# Patient Record
Sex: Female | Born: 1965 | Race: White | Hispanic: Yes | Marital: Married | Smoking: Current some day smoker
Health system: Southern US, Community
[De-identification: ages and names within clinical notes are randomized; demographics above are authoritative.]

## PROBLEM LIST (undated history)

## (undated) DIAGNOSIS — R079 Chest pain, unspecified: Secondary | ICD-10-CM

## (undated) DIAGNOSIS — Z1371 Encounter for nonprocreative screening for genetic disease carrier status: Secondary | ICD-10-CM

## (undated) DIAGNOSIS — K259 Gastric ulcer, unspecified as acute or chronic, without hemorrhage or perforation: Secondary | ICD-10-CM

## (undated) DIAGNOSIS — E538 Deficiency of other specified B group vitamins: Secondary | ICD-10-CM

## (undated) DIAGNOSIS — E7800 Pure hypercholesterolemia, unspecified: Secondary | ICD-10-CM

## (undated) DIAGNOSIS — D689 Coagulation defect, unspecified: Secondary | ICD-10-CM

## (undated) DIAGNOSIS — M1990 Unspecified osteoarthritis, unspecified site: Secondary | ICD-10-CM

## (undated) DIAGNOSIS — M2550 Pain in unspecified joint: Secondary | ICD-10-CM

## (undated) DIAGNOSIS — E119 Type 2 diabetes mellitus without complications: Secondary | ICD-10-CM

## (undated) DIAGNOSIS — K219 Gastro-esophageal reflux disease without esophagitis: Secondary | ICD-10-CM

## (undated) DIAGNOSIS — T7840XA Allergy, unspecified, initial encounter: Secondary | ICD-10-CM

## (undated) DIAGNOSIS — K769 Liver disease, unspecified: Secondary | ICD-10-CM

## (undated) DIAGNOSIS — E559 Vitamin D deficiency, unspecified: Secondary | ICD-10-CM

## (undated) DIAGNOSIS — R12 Heartburn: Secondary | ICD-10-CM

## (undated) DIAGNOSIS — K829 Disease of gallbladder, unspecified: Secondary | ICD-10-CM

## (undated) DIAGNOSIS — R002 Palpitations: Secondary | ICD-10-CM

## (undated) DIAGNOSIS — F32A Depression, unspecified: Secondary | ICD-10-CM

## (undated) DIAGNOSIS — D649 Anemia, unspecified: Secondary | ICD-10-CM

## (undated) DIAGNOSIS — I700 Atherosclerosis of aorta: Secondary | ICD-10-CM

## (undated) DIAGNOSIS — K5900 Constipation, unspecified: Secondary | ICD-10-CM

## (undated) DIAGNOSIS — G43909 Migraine, unspecified, not intractable, without status migrainosus: Secondary | ICD-10-CM

## (undated) DIAGNOSIS — K760 Fatty (change of) liver, not elsewhere classified: Secondary | ICD-10-CM

## (undated) DIAGNOSIS — R7303 Prediabetes: Secondary | ICD-10-CM

## (undated) DIAGNOSIS — K279 Peptic ulcer, site unspecified, unspecified as acute or chronic, without hemorrhage or perforation: Secondary | ICD-10-CM

## (undated) DIAGNOSIS — F419 Anxiety disorder, unspecified: Secondary | ICD-10-CM

## (undated) DIAGNOSIS — B9681 Helicobacter pylori [H. pylori] as the cause of diseases classified elsewhere: Secondary | ICD-10-CM

## (undated) DIAGNOSIS — M549 Dorsalgia, unspecified: Secondary | ICD-10-CM

## (undated) DIAGNOSIS — R569 Unspecified convulsions: Secondary | ICD-10-CM

## (undated) HISTORY — DX: Encounter for nonprocreative screening for genetic disease carrier status: Z13.71

## (undated) HISTORY — DX: Gastro-esophageal reflux disease without esophagitis: K21.9

## (undated) HISTORY — DX: Heartburn: R12

## (undated) HISTORY — DX: Pain in unspecified joint: M25.50

## (undated) HISTORY — DX: Vitamin D deficiency, unspecified: E55.9

## (undated) HISTORY — DX: Constipation, unspecified: K59.00

## (undated) HISTORY — DX: Coagulation defect, unspecified: D68.9

## (undated) HISTORY — DX: Chest pain, unspecified: R07.9

## (undated) HISTORY — DX: Depression, unspecified: F32.A

## (undated) HISTORY — DX: Unspecified osteoarthritis, unspecified site: M19.90

## (undated) HISTORY — DX: Anemia, unspecified: D64.9

## (undated) HISTORY — DX: Deficiency of other specified B group vitamins: E53.8

## (undated) HISTORY — DX: Dorsalgia, unspecified: M54.9

## (undated) HISTORY — DX: Palpitations: R00.2

## (undated) HISTORY — DX: Pure hypercholesterolemia, unspecified: E78.00

## (undated) HISTORY — DX: Anxiety disorder, unspecified: F41.9

## (undated) HISTORY — PX: COLONOSCOPY: SHX174

## (undated) HISTORY — DX: Liver disease, unspecified: K76.9

## (undated) HISTORY — DX: Fatty (change of) liver, not elsewhere classified: K76.0

## (undated) HISTORY — DX: Migraine, unspecified, not intractable, without status migrainosus: G43.909

## (undated) HISTORY — DX: Disease of gallbladder, unspecified: K82.9

## (undated) HISTORY — PX: APPENDECTOMY: SHX54

## (undated) HISTORY — DX: Allergy, unspecified, initial encounter: T78.40XA

## (undated) HISTORY — DX: Unspecified convulsions: R56.9

## (undated) HISTORY — DX: Atherosclerosis of aorta: I70.0

## (undated) HISTORY — DX: Type 2 diabetes mellitus without complications: E11.9

## (undated) HISTORY — DX: Gastric ulcer, unspecified as acute or chronic, without hemorrhage or perforation: K25.9

## (undated) HISTORY — DX: Prediabetes: R73.03

---

## 1985-07-22 HISTORY — PX: TONSILLECTOMY: SHX5217

## 1998-07-05 ENCOUNTER — Encounter: Admission: RE | Admit: 1998-07-05 | Discharge: 1998-07-05 | Payer: Self-pay | Admitting: Hematology and Oncology

## 1999-12-17 ENCOUNTER — Encounter: Admission: RE | Admit: 1999-12-17 | Discharge: 1999-12-17 | Payer: Self-pay | Admitting: Family Medicine

## 1999-12-17 ENCOUNTER — Encounter: Payer: Self-pay | Admitting: Family Medicine

## 2008-04-15 ENCOUNTER — Other Ambulatory Visit: Admission: RE | Admit: 2008-04-15 | Discharge: 2008-04-15 | Payer: Self-pay | Admitting: Gynecology

## 2008-04-15 ENCOUNTER — Encounter: Payer: Self-pay | Admitting: Gynecology

## 2008-04-15 ENCOUNTER — Ambulatory Visit: Payer: Self-pay | Admitting: Gynecology

## 2009-11-22 ENCOUNTER — Encounter (INDEPENDENT_AMBULATORY_CARE_PROVIDER_SITE_OTHER): Payer: Self-pay | Admitting: *Deleted

## 2009-12-05 ENCOUNTER — Ambulatory Visit: Payer: Self-pay | Admitting: Gastroenterology

## 2009-12-05 ENCOUNTER — Encounter (INDEPENDENT_AMBULATORY_CARE_PROVIDER_SITE_OTHER): Payer: Self-pay | Admitting: *Deleted

## 2009-12-05 DIAGNOSIS — K5909 Other constipation: Secondary | ICD-10-CM | POA: Insufficient documentation

## 2009-12-06 LAB — CONVERTED CEMR LAB: TSH: 0.63 microintl units/mL (ref 0.35–5.50)

## 2009-12-13 ENCOUNTER — Ambulatory Visit: Payer: Self-pay | Admitting: Gastroenterology

## 2009-12-13 ENCOUNTER — Encounter (INDEPENDENT_AMBULATORY_CARE_PROVIDER_SITE_OTHER): Payer: Self-pay | Admitting: *Deleted

## 2010-08-03 ENCOUNTER — Ambulatory Visit
Admission: RE | Admit: 2010-08-03 | Discharge: 2010-08-03 | Payer: Self-pay | Source: Home / Self Care | Attending: Internal Medicine | Admitting: Internal Medicine

## 2010-08-07 ENCOUNTER — Other Ambulatory Visit: Payer: Self-pay | Admitting: Gastroenterology

## 2010-08-07 ENCOUNTER — Ambulatory Visit
Admission: RE | Admit: 2010-08-07 | Discharge: 2010-08-07 | Payer: Self-pay | Source: Home / Self Care | Attending: Gastroenterology | Admitting: Gastroenterology

## 2010-08-07 DIAGNOSIS — K3189 Other diseases of stomach and duodenum: Secondary | ICD-10-CM | POA: Insufficient documentation

## 2010-08-07 DIAGNOSIS — R1013 Epigastric pain: Secondary | ICD-10-CM | POA: Insufficient documentation

## 2010-08-07 LAB — COMPREHENSIVE METABOLIC PANEL
ALT: 16 U/L (ref 0–35)
AST: 18 U/L (ref 0–37)
Albumin: 4.3 g/dL (ref 3.5–5.2)
Alkaline Phosphatase: 52 U/L (ref 39–117)
BUN: 13 mg/dL (ref 6–23)
CO2: 27 mEq/L (ref 19–32)
Calcium: 9.9 mg/dL (ref 8.4–10.5)
Chloride: 103 mEq/L (ref 96–112)
Creatinine, Ser: 0.8 mg/dL (ref 0.4–1.2)
GFR: 83.75 mL/min (ref >60.00)
Glucose, Bld: 97 mg/dL (ref 70–99)
Potassium: 5.1 mEq/L (ref 3.5–5.1)
Sodium: 139 mEq/L (ref 135–145)
Total Bilirubin: 0.4 mg/dL (ref 0.3–1.2)
Total Protein: 7.3 g/dL (ref 6.0–8.3)

## 2010-08-07 LAB — CBC WITH DIFFERENTIAL/PLATELET
Basophils Absolute: 0 10*3/uL (ref 0.0–0.1)
Basophils Relative: 0.7 % (ref 0.0–3.0)
Eosinophils Absolute: 0.2 10*3/uL (ref 0.0–0.7)
Eosinophils Relative: 2.9 % (ref 0.0–5.0)
HCT: 33.7 % — ABNORMAL LOW (ref 36.0–46.0)
Hemoglobin: 11.5 g/dL — ABNORMAL LOW (ref 12.0–15.0)
Lymphocytes Relative: 27.9 % (ref 12.0–46.0)
Lymphs Abs: 1.6 10*3/uL (ref 0.7–4.0)
MCHC: 34.1 g/dL (ref 30.0–36.0)
MCV: 87.8 fl (ref 78.0–100.0)
Monocytes Absolute: 0.4 10*3/uL (ref 0.1–1.0)
Monocytes Relative: 7.2 % (ref 3.0–12.0)
Neutro Abs: 3.6 10*3/uL (ref 1.4–7.7)
Neutrophils Relative %: 61.3 % (ref 43.0–77.0)
Platelets: 392 10*3/uL (ref 150.0–400.0)
RBC: 3.84 Mil/uL — ABNORMAL LOW (ref 3.87–5.11)
RDW: 13.9 % (ref 11.5–14.6)
WBC: 5.8 10*3/uL (ref 4.5–10.5)

## 2010-08-08 DIAGNOSIS — R1011 Right upper quadrant pain: Secondary | ICD-10-CM | POA: Insufficient documentation

## 2010-08-15 ENCOUNTER — Ambulatory Visit (HOSPITAL_COMMUNITY)
Admission: RE | Admit: 2010-08-15 | Discharge: 2010-08-15 | Payer: Self-pay | Source: Home / Self Care | Attending: Gastroenterology | Admitting: Gastroenterology

## 2010-08-16 ENCOUNTER — Ambulatory Visit (HOSPITAL_COMMUNITY)
Admission: RE | Admit: 2010-08-16 | Discharge: 2010-08-16 | Payer: Self-pay | Source: Home / Self Care | Attending: Gastroenterology | Admitting: Gastroenterology

## 2010-08-17 ENCOUNTER — Other Ambulatory Visit: Payer: Self-pay | Admitting: Gastroenterology

## 2010-08-17 DIAGNOSIS — R109 Unspecified abdominal pain: Secondary | ICD-10-CM

## 2010-08-21 NOTE — Letter (Signed)
Summary: [facility] Instructions  [facility]  [address] [place], [place] [address]   Phone: [phone]  Fax: [phone]       Anne Lewis    January 14, 1966    MRN: [id number]        Procedure Day /Date:12/13/09 WED     Arrival Time:1 pm     Procedure Time:2 pm     Location of Procedure:                    X  [facility] (4th Floor)   PREPARATION FOR COLONOSCOPY WITH MOVIPREP   Starting 5 days prior to your procedure 12/08/09 do not eat nuts, seeds, popcorn, corn, beans, peas,  salads, or any raw vegetables.  Do not take any fiber supplements (e.g. Metamucil, Citrucel, and Benefiber).  THE DAY BEFORE YOUR PROCEDURE         DATE: 12/12/09  DAY: TUE  1.  Drink clear liquids the entire day-NO SOLID FOOD  2.  Do not drink anything colored red or purple.  Avoid juices with pulp.  No orange juice.  3.  Drink at least 64 oz. (8 glasses) of fluid/clear liquids during the day to prevent dehydration and help the prep work efficiently.  CLEAR LIQUIDS INCLUDE: Water Jello Ice Popsicles Tea (sugar ok, no milk/cream) Powdered fruit flavored drinks Coffee (sugar ok, no milk/cream) Gatorade Juice: apple, white grape, white cranberry  Lemonade Clear bullion, consomm, broth Carbonated beverages (any kind) Strained chicken noodle soup Hard Candy                             4.  In the morning, mix first dose of MoviPrep solution:    Empty 1 Pouch A and 1 Pouch B into the disposable container    Add lukewarm drinking water to the top line of the container. Mix to dissolve    Refrigerate (mixed solution should be used within 24 hrs)  5.  Begin drinking the prep at 5:00 p.m. The MoviPrep container is divided by 4 marks.   Every 15 minutes drink the solution down to the next mark (approximately 8 oz) until the full liter is complete.   6.  Follow completed prep with 16 oz of clear liquid of your choice (Nothing red or purple).  Continue to drink  clear liquids until bedtime.  7.  Before going to bed, mix second dose of MoviPrep solution:    Empty 1 Pouch A and 1 Pouch B into the disposable container    Add lukewarm drinking water to the top line of the container. Mix to dissolve    Refrigerate  THE DAY OF YOUR PROCEDURE      DATE: 12/13/09 DAY: WED  Beginning at9 a.m. (5 hours before procedure):         1. Every 15 minutes, drink the solution down to the next mark (approx 8 oz) until the full liter is complete.  2. Follow completed prep with 16 oz. of clear liquid of your choice.    3. You may drink clear liquids until 12 noon (2 HOURS BEFORE PROCEDURE).   MEDICATION INSTRUCTIONS  Unless otherwise instructed, you should take regular prescription medications with a small sip of water   as early as possible the morning of your procedure.           OTHER INSTRUCTIONS  You will need a responsible adult at least 45 years of  age to accompany you and drive you home.   This person must remain in the waiting room during your procedure.  Wear loose fitting clothing that is easily removed.  Leave jewelry and other valuables at home.  However, you may wish to bring a book to read or  an iPod/MP3 player to listen to music as you wait for your procedure to start.  Remove all body piercing jewelry and leave at home.  Total time from sign-in until discharge is approximately 2-3 hours.  You should go home directly after your procedure and rest.  You can resume normal activities the  day after your procedure.  The day of your procedure you should not:   Drive   Make legal decisions   Operate machinery   Drink alcohol   Return to work  You will receive specific instructions about eating, activities and medications before you leave.    The above instructions have been reviewed and explained to me by   _______________________    I fully understand and can verbalize these instructions  _____________________________ Date _________

## 2010-08-21 NOTE — Letter (Signed)
Summary: Appt Reminder 2  [facility]  [address] [place], [place] [address]   Phone: [phone]  Fax: [phone]        Dec 13, 2009 MRN: [id number]    Anne Lewis [address] [place], [place]  [address]    Dear Ms. Hanahan,   You have a return appointment with Dr. [name] Hartigan on 01/16/10 at 10:45am.  Please remember to bring a complete list of the medicines you are taking, your insurance card and your co-pay.  If you have to cancel or reschedule this appointment, please call before 5:00 pm the evening before to avoid a cancellation fee.  If you have any questions or concerns, please call [phone].    Sincerely,    Chales Abrahams CMA (AAMA)  Appended Document: Appt Reminder 2 letter mailed

## 2010-08-21 NOTE — Procedures (Signed)
Summary: Colonoscopy  Patient: Anne Lewis Note: All result statuses are Final unless otherwise noted.  Tests: (1) Colonoscopy (COL)   COL Colonoscopy           DONE      [facility]     [address]     [place], [place]  [address]           COLONOSCOPY PROCEDURE REPORT           PATIENT:  Anne Lewis, Anne Lewis  MR#:  [id number]     BIRTHDATE:  September 11, 1965, 44 yrs. old  GENDER:  female     ENDOSCOPIST:  Rachael Fee, MD     REF. BY:  Aleatha Borer, M.D.     PROCEDURE DATE:  12/13/2009     PROCEDURE:  Diagnostic Colonoscopy     ASA CLASS:  Class II     INDICATIONS:  constipation     MEDICATIONS:   Fentanyl 100 mcg IV, Versed 11 mg IV, Benadryl 12.5     mg IV           DESCRIPTION OF PROCEDURE:   After the risks benefits and     alternatives of the procedure were thoroughly explained, informed     consent was obtained.  Digital rectal exam was performed and     revealed no rectal masses.   The Pentax Colonoscope [id number]     endoscope was introduced through the anus and advanced to the     terminal ileum which was intubated for a short distance, without     limitations.  The quality of the prep was good, using MoviPrep.     The instrument was then slowly withdrawn as the colon was fully     examined.     <<PROCEDUREIMAGES>>           FINDINGS:  External hemorrhoids were found. These were small and     not thrombosed.  Mild diverticulosis was found in the sigmoid to     descending colon segments (see image1).  The terminal ileum     appeared normal (see image5).  This was otherwise a normal     examination of the colon (see image4, image3, and image6).     Retroflexed views in the rectum revealed no abnormalities.    The     scope was then withdrawn from the patient and the procedure     completed.           COMPLICATIONS:  None     ENDOSCOPIC IMPRESSION:     1) Small external hemorrhoids     2) Mild diverticulosis in the sigmoid to descending colon  segments     3) Normal terminal ileum     4) Otherwise normal examination           RECOMMENDATIONS:     1) Continue current colorectal screening recommendations for     "routine risk" patients with a repeat colonoscopy in 10 years.     Continue 2 scoops of miralax daily.           REPEAT EXAM:  10 years           ______________________________     Rachael Fee, MD           n.     eSIGNED:   Rachael Fee at 12/13/2009 02:18 PM           Filomena Jungling, [id number]  Note: An exclamation mark (!) indicates a  result that was not dispersed into the flowsheet. Document Creation Date: 12/13/2009 2:19 PM _______________________________________________________________________  (1) Order result status: Final Collection or observation date-time: 12/13/2009 14:14 Requested date-time:  Receipt date-time:  Reported date-time:  Referring Physician:   Ordering Physician: Rob Bunting [phone]) Specimen Source:  Source: Launa Grill Order Number: [id number] Lab site:   Appended Document: Colonoscopy    Clinical Lists Changes  Observations: Added new observation of COLONNXTDUE: 11/2019 (12/13/2009 14:41)

## 2010-08-21 NOTE — Procedures (Signed)
Summary: Upper Endoscopy  Patient: Anne Lewis Note: All result statuses are Final unless otherwise noted.  Tests: (1) Upper Endoscopy (EGD)   EGD Upper Endoscopy       DONE     [facility]     [address]     [place], [place]  [address]           ENDOSCOPY PROCEDURE REPORT           PATIENT:  Anne, Lewis  MR#:  [id number]     BIRTHDATE:  11-11-1965, 44 yrs. old  GENDER:  female           ENDOSCOPIST:  Rachael Fee, MD     PROCEDURE DATE:  12/13/2009     PROCEDURE:  EGD with biopsy     ASA CLASS:  Class II     INDICATIONS:  abdominal pain, recent lab test suggesting H. pylori     Ab positive           MEDICATIONS:  There was residual sedation effect present from     prior procedure., Versed 2 mg IV     TOPICAL ANESTHETIC:  none, Exactacain Spray           DESCRIPTION OF PROCEDURE:   After the risks benefits and     alternatives of the procedure were thoroughly explained, informed     consent was obtained.  The [facility] [id number] endoscope was     introduced through the mouth and advanced to the second portion of     the duodenum, without limitations.  The instrument was slowly     withdrawn as the mucosa was fully examined.     <<PROCEDUREIMAGES>>           There was mild, non-specific granular gastritis. Biopsies taken to     check for H. pylori (sent to pathology) (see image5).  Otherwise     the examination was normal (see image6, image4, image3, and     image1).    Retroflexed views revealed no abnormalities.    The     scope was then withdrawn from the patient and the procedure     completed.           COMPLICATIONS:  None           ENDOSCOPIC IMPRESSION:     1) Mild gastritis, biopsied to check for H. pylori     2) Otherwise normal examination           RECOMMENDATIONS:     If biopsies confirm H. pylori infection, she will be started on     the appropriate antibiotics.     Dr. [name] Hartigan' office will set up return visit in 5-6 weeks.           ______________________________     Rachael Fee, MD           cc: Aleatha Borer, MD           n.     eSIGNED:   Rachael Fee at 12/13/2009 02:32 PM           Filomena Jungling, [id number]  Note: An exclamation mark (!) indicates a result that was not dispersed into the flowsheet. Document Creation Date: 12/13/2009 2:33 PM _______________________________________________________________________  (1) Order result status: Final Collection or observation date-time: 12/13/2009 14:24 Requested date-time:  Receipt date-time:  Reported date-time:  Referring Physician:   Ordering Physician: Rob Bunting [phone]) Specimen Source:  Source: Launa Grill  Order Number: [id number] Lab site:

## 2010-08-21 NOTE — Letter (Signed)
Summary: New Patient letter  [facility]  [address] [place], [place] [address]   Phone: [phone]  Fax: [phone]       11/22/2009 MRN: [id number]  Anne Lewis [address] [place], [place]  [address]  Dear Anne Lewis,  Welcome to the Gastroenterology Division at [facility].    You are scheduled to see Dr.  Rob Lewis on Dec 05, 2009 at  3:00pm on the 3rd floor at [facility], [address]  We ask that you try to arrive at our office 15 minutes prior to your appointment time to allow for check-in.  We would like you to complete the enclosed self-administered evaluation form prior to your visit and bring it with you on the day of your appointment.  We will review it with you.  Also, please bring a complete list of all your medications or, if you prefer, bring the medication bottles and we will list them.  Please bring your insurance card so that we may make a copy of it.  If your insurance requires a referral to see a specialist, please bring your referral form from your primary care physician.  Co-payments are due at the time of your visit and may be paid by cash, check or credit card.     Your office visit will consist of a consult with your physician (includes a physical exam), any laboratory testing he/she may order, scheduling of any necessary diagnostic testing (e.g. x-ray, ultrasound, CT-scan), and scheduling of a procedure (e.g. Endoscopy, Colonoscopy) if required.  Please allow enough time on your schedule to allow for any/all of these possibilities.    If you cannot keep your appointment, please call [phone] to cancel or reschedule prior to your appointment date.  This allows [place] the opportunity to schedule an appointment for another patient in need of care.  If you do not cancel or reschedule by 5 p.m. the business day prior to your appointment date, you will be charged a $50.00 late cancellation/no-show fee.     Thank you for choosing [facility] for your medical needs.  We appreciate the opportunity to care for you.  Please visit [place] at our website  to learn more about our practice.                     Sincerely,                                                             The Gastroenterology Division

## 2010-08-21 NOTE — Assessment & Plan Note (Signed)
History of Present Illness Visit Type: consult  Primary GI MD: Rob Bunting MD Primary Provider: Adelene Amas. Kathrynn Running, MD  Requesting Provider: Adelene Amas. Kathrynn Running, MD  Chief Complaint: diarrhea, and chronic constipation History of Present Illness:     very pleasant 45 year old woman with severe chronic constipation.  She has had lower abdominal pains that are usually improved with a good BM.Marland Kitchen  One month ago went to PCP, found to be constipated on imaging.    She has been very constipated, this was for a long time.  Normal for her is a BM every 1-2 weeks.  May have seen blood in her stool.  Spends alot of time in bathroom.  Never had colonoscopy.    3 years ago she was abusing laxatives but she rarely takes for now.  Has one capful of miralax a day, for 3 weeks, not improving.  she had recent lab testing showing a normal complete metabolic profile, her H. pylori IgG antibody was elevated           Current Medications (verified): 1)  Omeprazole 20 Mg Cpdr (Omeprazole) .... One Tablet By Mouth Two Times A Day 2)  Promethazine Hcl 25 Mg Tabs (Promethazine Hcl) .... One Tablet By Mouth As Needed For Nausea 3)  Miralax  Powd (Polyethylene Glycol 3350) .... Once Daily  Allergies (verified): 1)  ! * Asprin  Past History:  Past Medical History: h pylori positive by IgG antibody April, 2011 Anemia GERD  chronic constipation  Past Surgical History: c section   Family History: no colon cancer Mother had colon polyps  Social History: she is single, she has 3 children, she works as an Advertising account planner, she smoke cigarettes, she rarely drinks alcohol.  Review of Systems       Pertinent positive and negative review of systems were noted in the above HPI and GI specific review of systems.  All other review of systems was otherwise negative.   Vital Signs:  Patient profile:   45 year old female Height:      69 inches Weight:      193 pounds BMI:     28.60 BSA:      2.04 Pulse rate:   68 / minute Pulse rhythm:   regular BP sitting:   132 / 76  (left arm) Cuff size:   regular  Vitals Entered By: Ok Anis CMA (Dec 05, 2009 2:53 PM)  Physical Exam  Additional Exam:  Constitutional: generally well appearing Psychiatric: alert and oriented times 3 Eyes: extraocular movements intact Mouth: oropharynx moist, no lesions Neck: supple, no lymphadenopathy Cardiovascular: heart regular rate and rythm Lungs: CTA bilaterally Abdomen: soft, non-tender, non-distended, no obvious ascites, no peritoneal signs, normal bowel sounds Extremities: no lower extremity edema bilaterally Skin: no lesions on visible extremities    Impression & Recommendations:  Problem # 1:  Chronic constipation she has what sounds like severe, chronic constipation. She has not had a significant BM and 2-3 weeks. Today and tomorrow we will she will undergo a purging regimen with enema and high-dose MiraLax. She will be a colonoscopy hopefully sometime next week. She also get a TSH level checked today.  Problem # 2:  H. pylori antibody positive unclear she actually has H. pylori infection in her stomach and so we will proceed with EGD at the same time as her colonoscopy.  Other Orders: TLB-TSH (Thyroid Stimulating Hormone) [phone])  Patient Instructions: 1)  Use two fleets enemas today. 2)  6 scoops of miralax  tonight. 3)  6 scoops of miralax tomorrow. 4)  Then 2 scoops of miralax every day after that. 5)  You will be scheduled to have a colonoscopy. 6)  You will be scheduled to have an upper endoscopy. 7)  You will get lab test(s) done today (tsh). 8)  The medication list was reviewed and reconciled.  All changed / newly prescribed medications were explained.  A complete medication list was provided to the patient / caregiver.  Appended Document: Orders Update/movi    Clinical Lists Changes  Medications: Added new medication of MOVIPREP 100 GM  SOLR  (PEG-KCL-NACL-NASULF-NA ASC-C) As per prep instructions. - Signed Rx of MOVIPREP 100 GM  SOLR (PEG-KCL-NACL-NASULF-NA ASC-C) As per prep instructions.;  #1 x 0;  Signed;  Entered by: Chales Abrahams CMA (AAMA);  Authorized by: Rachael Fee MD;  Method used: Electronically to [facility] [address], [place], [place], [place]  [address], Ph: [phone], Fax: [phone] Orders: Added new Test order of Colon/Endo (Colon/Endo) - Signed    Prescriptions: MOVIPREP 100 GM  SOLR (PEG-KCL-NACL-NASULF-NA ASC-C) As per prep instructions.  #1 x 0   Entered by:   Chales Abrahams CMA (AAMA)   Authorized by:   Rachael Fee MD   Signed by:   Chales Abrahams CMA (AAMA) on 12/05/2009   Method used:   Electronically to        [facility] (retail)       [address]       [place], [place]  [address]       Ph: [phone]       Fax: [phone]   RxID:   [id number]

## 2010-08-21 NOTE — Letter (Signed)
Summary: Return to Work  [facility]  [address] [place], [place] [address]   Phone: [phone]  Fax: [phone]    12/05/2009  TO: Leodis Sias IT MAY CONCERN  RE: SADY MONACO [address] [id number]   The above named individual is under my medical care and may return to work on:12/07/09    If you have any further questions or need additional information, please call.     Sincerely,    typed by: Chales Abrahams CMA (AAMA)

## 2010-08-23 NOTE — Assessment & Plan Note (Signed)
Review of gastrointestinal problems: 1. Gastritis, H. pylori positive on EGD biopsies May 2011. Put on appropriate antibiotics 2. routine risk for colon cancer, colonoscopy May 2011 found no polyps, recall colonoscopy at 10 year interval    History of Present Illness Visit Type: Follow-up Visit Primary GI MD: Rob Bunting MD Primary Provider: Adelene Amas. Kathrynn Running, MD  Requesting Provider: na Chief Complaint: upper abd pain  History of Present Illness:     very pleasant 45 year old woman who is here with her husband today. I last saw her several months ago  whom I last saw several months ago.  was taking miralax 2 scoops a day.  Had anal discomfort this past weekend, in AM, after her first Amitiza.   She was doing well on miralax.  She has a lot of noise after eating, any meal.  Also has post prandial discomfort that tends to be in the right upper quadrant. She believes she an ultrasound last year and was told that she had no gallstones.               Current Medications (verified): 1)  Omeprazole-Sodium Bicarbonate 40-1100 Mg Caps (Omeprazole-Sodium Bicarbonate) .... One By Mouth Once Daily 2)  Amitiza 8 Mcg Caps (Lubiprostone) .... One By Mouth Once Daily To Two Times A Day 3)  Hyoscyamine Sulfate 0.125 Mg Tabs (Hyoscyamine Sulfate) .... One By Mouth Qid As Needed For Abdominal Cramping  Allergies (verified): 1)  ! * Asprin  Vital Signs:  Patient profile:   45 year old female Height:      69 inches Weight:      155 pounds BMI:     22.97 BSA:     1.86 Pulse rate:   76 / minute Pulse rhythm:   regular BP sitting:   118 / 64  (left arm) Cuff size:   regular  Vitals Entered By: Ok Anis CMA (August 07, 2010 1:49 PM)  Physical Exam  Additional Exam:  Constitutional: generally well appearing Psychiatric: alert and oriented times 3 Abdomen: soft, non-tender, non-distended, normal bowel sounds    Impression & Recommendations:  Problem # 1:  Chronic  constipation her anal discomfort this past weekend is unlikely to be related to the and Amitiza which she took however she really does not want to take it again and since she has been doing well on MiraLax I recommended she restart that however at a bit lower dose.  Problem # 2:  postprandial discomforts she has a lot of gastric sounds as well as some right upper quadrant discomforts after most meals.  We will get her recent ultrasound report sent over and if it is indeed normal like she believes it is then I would set her up with HIDA scan to estimate gallbladder ejection fraction. She has clearly had H. pylori in the past gallbladder evaluation is negative then I would retry her on antibiotics, probably pylera.  Patient Instructions: 1)  Stop the amitiza. 2)  Stay on miralax, but back down to once scoop a day. 3)  We will get your recent US report sent over. 4)  Depending on those results, will consider HIDA scan to check for abnormal GB imaging.  If that is normal, will try retreating H. pylori, perhaps with pylera. 5)  You will get lab test(s) done today (cbc, cmet) 6)  The medication list was reviewed and reconciled.  All changed / newly prescribed medications were explained.  A complete medication list was provided to the patient / caregiver.  Appended Document: Orders Update    Clinical Lists Changes  Problems: Added new problem of DYSPEPSIA&OTHER SPEC DISORDERS FUNCTION STOMACH (ICD-536.8) Added new problem of ABDOMINAL PAIN, EPIGASTRIC (ICD-789.06) Orders: Added new Test order of TLB-CBC Platelet - w/Differential (85025-CBCD) - Signed Added new Test order of TLB-CMP (Comprehensive Metabolic Pnl) (80053-COMP) - Signed      Appended Document: Orders Update    Clinical Lists Changes  Problems: Added new problem of RUQ PAIN (ICD-789.01) Orders: Added new Test order of Ultrasound Abdomen (UAS) - Signed

## 2010-08-23 NOTE — Assessment & Plan Note (Signed)
Summary: new to est bcbs/mhf   Vital Signs:  Patient profile:   45 year old female Menstrual status:  regular LMP:     07/10/2010 Height:      68.25 inches Weight:      157.75 pounds BMI:     23.90 O2 Sat:      100 % on Room air Temp:     98.1 degrees F oral Pulse rate:   86 / minute Resp:     16 per minute BP sitting:   104 / 70  (right arm) Cuff size:   regular  Vitals Entered By: Glendell Docker CMA (August 03, 2010 9:46 AM)  O2 Flow:  Room air CC: New Patient Is Patient Diabetic? No Pain Assessment Patient in pain? no      Comments dicusss abdominal discomfort, dx with H- Pylori,  LMP (date): 07/10/2010     Menstrual Status regular Enter LMP: 07/10/2010 Last PAP Result normal   Primary Care Provider:  Dondra Spry DO  CC:  New Patient.  History of Present Illness: Prev PCP Dr. Kathrynn Running  intermittent upper abd pain after eating occ wakes up in the night with vomiting loud noises from stomach  denies chronic NSAID use  1 large coffee per day  4 cigarettes per day   Preventive Screening-Counseling & Management  Alcohol-Tobacco     Alcohol drinks/day: 0     Smoking Status: current     Packs/Day: <0.25     Year Started: 1992  Caffeine-Diet-Exercise     Caffeine use/day: 1 beverage daily     Does Patient Exercise: no  Allergies: 1)  ! * Asprin  Past History:  Past Medical History: h pylori gastritis Anemia GERD  chronic constipation  Family History: no colon cancer Mother had colon polyps   Social History: Smoking Status:  current Packs/Day:  <0.25 Caffeine use/day:  1 beverage daily Does Patient Exercise:  no  Review of Systems  The patient denies anorexia, weight gain, chest pain, dyspnea on exertion, peripheral edema, prolonged cough, melena, hematochezia, and depression.    Physical Exam  General:  alert, well-developed, and well-nourished.   Head:  normocephalic and atraumatic.   Eyes:  pupils equal, pupils round, and  pupils reactive to light.   Mouth:  good dentition and pharynx pink and moist.   Neck:  No deformities, masses, or tenderness noted. Lungs:  Normal respiratory effort, chest expands symmetrically. Lungs are clear to auscultation, no crackles or wheezes. Heart:  Normal rate and regular rhythm. S1 and S2 normal without gallop, murmur, click, rub or other extra sounds. Abdomen:  mild epigastric tenderness,soft, no masses, no hepatomegaly, and no splenomegaly.   Extremities:  No lower extremity edema  Neurologic:  cranial nerves II-XII intact and gait normal.   Psych:  normally interactive, good eye contact, not anxious appearing, and not depressed appearing.     Impression & Recommendations:  Problem # 1:  ABDOMINAL PAIN, EPIGASTRIC (ICD-789.06) question recurrence of H. Pylori f/u with GI  Problem # 2:  DYSPEPSIA&OTHER SPEC DISORDERS FUNCTION STOMACH (ICD-536.8) trial of amitiza, probiotics and bulk laxatives  Complete Medication List: 1)  Omeprazole-sodium Bicarbonate 40-1100 Mg Caps (Omeprazole-sodium bicarbonate) .... One by mouth once daily 2)  Hyoscyamine Sulfate 0.125 Mg Tabs (Hyoscyamine sulfate) .... One by mouth qid as needed for abdominal cramping  Patient Instructions: 1)  Use bulk laxative  2)  Start probiotic supplement 3)  Use amitza samples as directed 4)  Please schedule a follow-up appointment in  1 month. 5)  Please forward copy of previous test results (CT scan of abdomin and pelvis, abdominal x ray) Prescriptions: HYOSCYAMINE SULFATE 0.125 MG TABS (HYOSCYAMINE SULFATE) one by mouth qid as needed for abdominal cramping  #60 x 1   Entered and Authorized by:   D. Thomos Lemons DO   Signed by:   D. Thomos Lemons DO on 08/03/2010   Method used:   Electronically to        [facility] (retail)       [address]       [place], [place]  [address]       Ph: [phone]       Fax: [phone]   RxID:    [id number] OMEPRAZOLE-SODIUM BICARBONATE 40-1100 MG CAPS (OMEPRAZOLE-SODIUM BICARBONATE) one by mouth once daily  #30 x 5   Entered and Authorized by:   D. Thomos Lemons DO   Signed by:   D. Thomos Lemons DO on 08/03/2010   Method used:   Electronically to        [facility] (retail)       [address]       [place], [place]  [address]       Ph: [phone]       Fax: [phone]   RxID:   [id number]    Orders Added: 1)  New Patient Level III [35573]    Current Allergies (reviewed today): ! * ASPRIN    Preventive Care Screening  Pap Smear:    Date:  12/08/2007    Results:  normal   Mammogram:    Date:  11/30/1996    Results:  Fibrocystic Breast

## 2010-08-27 ENCOUNTER — Other Ambulatory Visit (HOSPITAL_COMMUNITY): Payer: Self-pay

## 2010-09-05 ENCOUNTER — Other Ambulatory Visit (HOSPITAL_COMMUNITY): Payer: Self-pay

## 2011-05-29 ENCOUNTER — Ambulatory Visit (INDEPENDENT_AMBULATORY_CARE_PROVIDER_SITE_OTHER): Payer: BC Managed Care – PPO | Admitting: Gynecology

## 2011-05-29 ENCOUNTER — Other Ambulatory Visit (HOSPITAL_COMMUNITY)
Admission: RE | Admit: 2011-05-29 | Discharge: 2011-05-29 | Disposition: A | Discharge status: Home / Self Care | Payer: BC Managed Care – PPO | Source: Ambulatory Visit | Attending: Gynecology | Admitting: Gynecology

## 2011-05-29 ENCOUNTER — Encounter: Payer: Self-pay | Admitting: Gynecology

## 2011-05-29 VITALS — BP 104/62 | Ht 69.0 in | Wt 156.0 lb

## 2011-05-29 DIAGNOSIS — Z01419 Encounter for gynecological examination (general) (routine) without abnormal findings: Secondary | ICD-10-CM

## 2011-05-29 DIAGNOSIS — B373 Candidiasis of vulva and vagina: Secondary | ICD-10-CM

## 2011-05-29 DIAGNOSIS — N644 Mastodynia: Secondary | ICD-10-CM

## 2011-05-29 DIAGNOSIS — N898 Other specified noninflammatory disorders of vagina: Secondary | ICD-10-CM

## 2011-05-29 DIAGNOSIS — N920 Excessive and frequent menstruation with regular cycle: Secondary | ICD-10-CM

## 2011-05-29 MED ORDER — FLUCONAZOLE 150 MG PO TABS: 150.0000 mg | ORAL_TABLET | Freq: Once | ORAL | Status: AC

## 2011-05-29 NOTE — Progress Notes (Signed)
Anne Lewis April 15, 1966 [id number]        45 y.o.  for annual exam.  Complaints of continued menorrhagia and right breast pain intermittent comes and goes over the past year or so.  Past medical history,surgical history, medications, allergies, family history and social history were all reviewed and documented in the EPIC chart. ROS:  Was performed and pertinent positives and negatives are included in the history.  Exam: chaperone present Filed Vitals:   05/29/11 1526  BP: 104/62   General appearance  Normal Skin grossly normal Head/Neck normal with no cervical or supraclavicular adenopathy thyroid normal Lungs  clear Cardiac RR, without RMG Abdominal  soft, nontender, without masses, organomegaly or hernia Breasts  examined lying and sitting. Right without masses, retractions, discharge. She is tender in the right tail of Spence no abnormal abnormalities.  She has a small nodule in the axilla it feels like a small lymph node possible subcutaneous cyst. Left without masses retractions discharge adenopathy.   Pelvic  Ext/BUS/vagina  normal his white discharge KOH wet prep done  Cervix  normal  Pap done  Uterus  retroverted, normal size, shape and contour, midline and mobile nontender   Adnexa  Without masses or tenderness    Anus and perineum  normal   Rectovaginal  normal sphincter tone without palpated masses or tenderness.    Assessment/Plan:  45 y.o. female for annual exam.    1. White discharge. KOH wet prep positive for yeast. She does report vaginal discharge frequently the week before her menses. I treated her with Diflucan 150x1 dose and I gave her several refills to have available. 2. Menorrhagia. We've discussed this several years ago when she saw me for her annual and she never followed up for sonohysterogram as was recommended. Her thyroid screen was normal. Again recommended starting with sonohysterogram she agrees to schedule this. 3. Right breast pain.  She does  have a small nodule in the right axilla that feels like a small lymph node or subcutaneous cyst. She says this has been there for a long time remained stable. She is tender in the right tail of Spence although I do not feel any abnormalities. Recommended diagnostic mammogram-ultrasound both of the right tail of Spence as well as right axilla when range of motion she knows the importance of follow up. 4. Health maintenance. SBE monthly stressed. She'll follow up for her studies as above. Pap done today issues not had one in several years.  No lab work was done as this was all done through her primary who she routinely sees and says screened her for glucose lipid profile and other labs. She is mildly anemic on review of her chart I suspect this is due to her menorrhagia.    Anne Lords MD, 4:26 PM 05/29/2011

## 2011-05-30 ENCOUNTER — Other Ambulatory Visit: Payer: Self-pay | Admitting: Dermatology

## 2011-05-31 ENCOUNTER — Telehealth: Payer: Self-pay | Admitting: *Deleted

## 2011-05-31 ENCOUNTER — Other Ambulatory Visit: Payer: Self-pay | Admitting: *Deleted

## 2011-05-31 DIAGNOSIS — N644 Mastodynia: Secondary | ICD-10-CM

## 2011-05-31 NOTE — Telephone Encounter (Signed)
Patient set up for appointment with [facility] on 06/17/11 @7 :30am.  Patient was informed. Orders in pc.

## 2011-05-31 NOTE — Telephone Encounter (Signed)
Message copied by Valeda Malm L on Fri May 31, 2011 11:35 AM ------      Message from: Colin Broach P      Created: Wed May 29, 2011  4:53 PM       Anne Lewis,  patient needs diagnostic mammogram right breast ultrasound reference complaints of right tail of Spence pain exam is normal. Has small right axillary nodule for ultrasound also

## 2011-06-12 ENCOUNTER — Ambulatory Visit (INDEPENDENT_AMBULATORY_CARE_PROVIDER_SITE_OTHER): Payer: BC Managed Care – PPO

## 2011-06-12 ENCOUNTER — Encounter: Payer: Self-pay | Admitting: Gynecology

## 2011-06-12 ENCOUNTER — Ambulatory Visit (INDEPENDENT_AMBULATORY_CARE_PROVIDER_SITE_OTHER): Payer: BC Managed Care – PPO | Admitting: Gynecology

## 2011-06-12 ENCOUNTER — Other Ambulatory Visit: Payer: Self-pay | Admitting: Gynecology

## 2011-06-12 VITALS — BP 110/70

## 2011-06-12 DIAGNOSIS — N920 Excessive and frequent menstruation with regular cycle: Secondary | ICD-10-CM

## 2011-06-12 DIAGNOSIS — N852 Hypertrophy of uterus: Secondary | ICD-10-CM

## 2011-06-12 DIAGNOSIS — D259 Leiomyoma of uterus, unspecified: Secondary | ICD-10-CM

## 2011-06-12 DIAGNOSIS — D251 Intramural leiomyoma of uterus: Secondary | ICD-10-CM

## 2011-06-12 DIAGNOSIS — N854 Malposition of uterus: Secondary | ICD-10-CM

## 2011-06-12 DIAGNOSIS — N831 Corpus luteum cyst of ovary, unspecified side: Secondary | ICD-10-CM

## 2011-06-12 NOTE — Progress Notes (Signed)
Patient presents for sonohistogram due to her history of menorrhagia. Ultrasound initially shows an endometrial echo 9.7 mm. She has several small myomas largest measuring 34 mm. Right / left ovaries normal with physiologic changes. Sonohysterogram was performed sterile technique requiring single tooth tenaculum cervical traction as uterus was significantly retroverted, good distention, no endometrial defects seen. Endometrial sample was taken. Patient tolerated well.  Assessment and plan: Menorrhagia several small myomas. Patient will follow up for biopsy results. I discussed options to include hormonal manipulation, Mirena IUD, endometrial ablation, hysterectomy. Patient has tried oral contraceptives in the past as well as Depo-Provera she had continued irregular bleeding on these and does not want to entertain those. Mirena IUD placement procedure was discussed. It was somewhat difficult to get the sonohistogram catheter in place I think a paracervical would help with the IUD placement I discussed this with her. I also reviewed with the endometrial ablation she is not actively using contraception as she is not sexually active but she would need to actively use contraception if she had the endometrial ablation and she understands she should never pursue pregnancy following this as it could be dangerous. Patient wants to think of her options and she will follow up her biopsy results and we'll go from there.

## 2011-06-17 ENCOUNTER — Ambulatory Visit
Admission: RE | Admit: 2011-06-17 | Discharge: 2011-06-17 | Disposition: A | Discharge status: Home / Self Care | Payer: BC Managed Care – PPO | Source: Ambulatory Visit | Attending: Gynecology | Admitting: Gynecology

## 2011-06-17 DIAGNOSIS — N644 Mastodynia: Secondary | ICD-10-CM

## 2011-06-24 ENCOUNTER — Other Ambulatory Visit: Payer: Self-pay

## 2011-06-24 ENCOUNTER — Telehealth: Payer: Self-pay

## 2011-06-24 DIAGNOSIS — N920 Excessive and frequent menstruation with regular cycle: Secondary | ICD-10-CM

## 2011-06-24 NOTE — Telephone Encounter (Signed)
I called pt. regarding Her Option Ablation.  I talked with Eber Jones at [facility] on 11/30 and she confirmed ablation in office for $15 copymt. Pt informed of this.  Patient is ready to schedule. Anticipates her period to start around Dec 13 and she flows for 10 days.  She knows she will be needing to start Prometrium on Day 3 of her period and she will call me when it starts so I can call that in.  (I will also call her Cytotec in and schedule her post op appt at that same time.) She understands we cannot do procedure if she is bleeding.  We scheduled her for January 7th with preop on Dec 28 at 3:00pm. She knows she will need someone to drive her to and from the procedure. I mailed her full bladder instructions as well as these dates.

## 2011-07-01 ENCOUNTER — Telehealth: Payer: Self-pay

## 2011-07-01 MED ORDER — MISOPROSTOL 200 MCG PO TABS: ORAL_TABLET | ORAL | Status: AC

## 2011-07-01 MED ORDER — PROGESTERONE MICRONIZED 200 MG PO CAPS: ORAL_CAPSULE | ORAL | Status: AC

## 2011-07-01 NOTE — Telephone Encounter (Signed)
Patient called to say her period had started on Sat., Dec 8th, a week earlier than she anticipated and now she is concerned she will be starting her period again at the time we had scheduled ablation. We rescheduled her for Weds., Dec. 19th at 9:30am and her preop appt with Dr. Velvet Bathe will be Monday, Dec 17 at 2:30pm.  I instructed her regarding need to start Prometrium tonight, Day 3 and take daily and also, regarding Cytotec vaginally hs before procedure.  I have e-scribed those to her pharmacy.  She was instructed regarding full bladder day of procedure and to have someone to drive her to and from procedure.     I left message for patient to call me back so I can review with her the need to use reliable birth control to include vasectomy and lap ster or consistent condom use per TF and to be sure she understands it would be dangerous to become pregnant after ablation.

## 2011-07-01 NOTE — Telephone Encounter (Signed)
Patient called me back and we discussed how important it will be for her to use reliable birth control after ablation and how dangerous it could be for her to become pregnant.  She is not interested in tubal ligation and said she is not sexually active now.  I explained that even sometime later if she were to become sexually active that she will have to be so careful.  I encouraged her to consider lap ster or vasectomy in partner if she does contemplate becoming sexually active again.  If not she must use condoms consistently.  She said she understood.

## 2011-07-08 ENCOUNTER — Other Ambulatory Visit: Payer: Self-pay

## 2011-07-08 ENCOUNTER — Encounter: Payer: Self-pay | Admitting: Gynecology

## 2011-07-08 ENCOUNTER — Ambulatory Visit (INDEPENDENT_AMBULATORY_CARE_PROVIDER_SITE_OTHER): Payer: BC Managed Care – PPO | Admitting: Gynecology

## 2011-07-08 VITALS — BP 120/70

## 2011-07-08 DIAGNOSIS — N920 Excessive and frequent menstruation with regular cycle: Secondary | ICD-10-CM

## 2011-07-08 MED ORDER — AZITHROMYCIN 500 MG PO TABS: ORAL_TABLET | ORAL | Status: DC

## 2011-07-08 MED ORDER — HYDROCODONE-ACETAMINOPHEN 5-500 MG PO TABS: ORAL_TABLET | ORAL | Status: DC

## 2011-07-08 MED ORDER — DIAZEPAM 5 MG PO TABS: ORAL_TABLET | ORAL | Status: DC

## 2011-07-08 NOTE — Progress Notes (Signed)
Patient presents preop for upcoming HerOption endometrial ablation.  We again discussed options for her bleeding as noted in her 06/12/2011 note. Hormonal manipulation, Mirena IUD, endometrial ablation and hysterectomy were discussed. She is not sexual active but does not have sterilization. I reviewed with HerOption endometrial ablation that she should never pursue pregnancy and in fact it would be very dangerous if she did get pregnant and needs to use assured birth control.  IUD or sterilization with ablation was discussed and offered and she declined, understanding the absolute need to use contraception through menopause and accepts this and wants to proceed with the procedure. I reviewed what is involved with the procedure to include the risks of bleeding, transfusion, infection, uterine perforation, damage to internal organs requiring major reparative surgeries as well as hematometria requiring reoperation in the future were all reviewed with her. She understands there are no guarantees of menorrhagia relief and her pain, bleeding may continue worsen or change following the procedure and she understands and accepts this. She has read through our consent form and signed it. I reviewed medication use with her both preoperative and postoperative questions were answered.  Exam with chaperone present external BUS vagina normal. The cervix normal. Uterus retroverted midline mobile nontender adnexa without masses or tenderness

## 2011-07-08 NOTE — Patient Instructions (Signed)
Follow up for HerOption endometrial ablation as scheduled

## 2011-07-08 NOTE — Progress Notes (Signed)
RX'S WERE FAXED TO [facility] PER DR. TF/KA  DR. TF, you will need to sign off RX's in the EPIC system as I do not have security to do that. thx

## 2011-07-10 ENCOUNTER — Ambulatory Visit (INDEPENDENT_AMBULATORY_CARE_PROVIDER_SITE_OTHER): Payer: BC Managed Care – PPO | Admitting: Gynecology

## 2011-07-10 ENCOUNTER — Ambulatory Visit: Payer: BC Managed Care – PPO

## 2011-07-10 ENCOUNTER — Encounter: Payer: Self-pay | Admitting: Gynecology

## 2011-07-10 VITALS — BP 116/70 | HR 80

## 2011-07-10 DIAGNOSIS — N949 Unspecified condition associated with female genital organs and menstrual cycle: Secondary | ICD-10-CM

## 2011-07-10 DIAGNOSIS — N920 Excessive and frequent menstruation with regular cycle: Secondary | ICD-10-CM

## 2011-07-10 HISTORY — PX: ENDOMETRIAL CRYOABLATION: SHX1499

## 2011-07-10 MED ORDER — LIDOCAINE HCL 1 % IJ SOLN: 15.0000 mL | Freq: Once | INTRAMUSCULAR | Status: DC

## 2011-07-10 NOTE — Progress Notes (Signed)
HER OPTION ENDOMETRIAL ABLATION PROCEDURAL NOTE    Anne Lewis [birth year] [id number]   07/10/2011  Diagnosis:  Excessive uterine bleeding / Menorrhagia  Procedure:  Endometrial cryoablation with intraoperative ultrasonic guidance  Procedure:  The patient was brought to the treatment room having previously been counseled for the procedure and having signed the consent form that is scanned into Epic. Pre procedural medications received were 2 Lortab 5.0 at 9:50 AM.  The patient was placed in the dorsal lithotomy position and a speculum was inserted. The cervix and upper vagina were cleansed with Betadine. A single-tooth tenaculum was placed on the anterior lip of the cervix. A  paracervical block was placed yes using 15 cc's of 1% lidocaine.  The uterus was yes sounded 9 centimeters. Cervical dilatation performed no . Under ultrasound guidance, the Her Option probe was introduced into the uterine cavity after the preprocedural sequence was performed. After assuring proper cornual placement, cryoablation was then performed under continuous ultrasound guidance monitoring the growth of the cryo-zone. Sequential cryoablation's were performed in the following order:   Location   Length of Time Myometrial Depth 1. right cornua   6 minutes  Sagittal 10.2 mm transverse 10.4 mm mm 2. left cornua   5 minutes  Sagittal 8.7 mm transverse 8.0 mm mm    Upon completion of the procedure the instruments were removed, hemostasis visualized the patient was assisted to the bathroom and then to another exam room where she was observed. The patient tolerated the procedure well and was released in stable condition with her driver along with a copy of the postprocedural instructions and precautions which were reviewed with her. She is to return to the office in 2 weeks for post procedural check and received an appointment date and time 9 Jan @ 3:30 .    Dara Lords MD, 10:48 AM 07/10/2011

## 2011-07-18 ENCOUNTER — Encounter: Payer: Self-pay | Admitting: Gynecology

## 2011-07-19 ENCOUNTER — Institutional Professional Consult (permissible substitution): Payer: BC Managed Care – PPO | Admitting: Gynecology

## 2011-07-29 ENCOUNTER — Ambulatory Visit: Payer: BC Managed Care – PPO | Admitting: Gynecology

## 2011-07-29 ENCOUNTER — Other Ambulatory Visit: Payer: BC Managed Care – PPO

## 2011-07-30 ENCOUNTER — Institutional Professional Consult (permissible substitution): Payer: BC Managed Care – PPO | Admitting: Gynecology

## 2011-07-31 ENCOUNTER — Ambulatory Visit: Payer: BC Managed Care – PPO | Admitting: Gynecology

## 2011-08-02 ENCOUNTER — Ambulatory Visit: Payer: BC Managed Care – PPO | Admitting: Gynecology

## 2011-08-02 ENCOUNTER — Other Ambulatory Visit: Payer: BC Managed Care – PPO

## 2011-08-08 ENCOUNTER — Ambulatory Visit (INDEPENDENT_AMBULATORY_CARE_PROVIDER_SITE_OTHER): Payer: BC Managed Care – PPO | Admitting: Gynecology

## 2011-08-08 ENCOUNTER — Encounter: Payer: Self-pay | Admitting: Gynecology

## 2011-08-08 DIAGNOSIS — Z9889 Other specified postprocedural states: Secondary | ICD-10-CM

## 2011-08-08 DIAGNOSIS — N920 Excessive and frequent menstruation with regular cycle: Secondary | ICD-10-CM

## 2011-08-08 NOTE — Progress Notes (Signed)
Patient presents 1 month postop from HerOption endometrial ablation. She's done well with some bleeding on and off.  Exam was Sherri chaperone present Abdomen soft nontender no masses guarding rebound organomegaly. Pelvic external BUS vagina normal. Cervix normal. Uterus retroverted normal size midline mobile nontender. Adnexa without masses or tenderness.  Assessment and plan: Status post HerOption endometrial ablation doing well. Patient will keep menstrual calendar assuming acceptable periods then we'll follow up in the fall when she is due for her annual. I again stressed the need to use contraception if she becomes sexually active.

## 2011-08-08 NOTE — Patient Instructions (Signed)
Keep menstrual calendar. As long as you're periods are acceptable then we'll monitor and follow up in the fall when you're due for your annual exam.

## 2011-08-16 ENCOUNTER — Encounter: Payer: Self-pay | Admitting: *Deleted

## 2011-08-16 NOTE — Progress Notes (Signed)
Patient ID: Anne Lewis, female   DOB: [birth year], 46 y.o.   MRN: [id number] Pt called c/o bleeding LMP:08/15/11 PER LAST POST-OP VISIT PT IS KEEP MENSTRUAL CALENDER OF PERIODS. PT INFORMED WITH THIS AND IS OKAY

## 2011-08-20 ENCOUNTER — Telehealth: Payer: Self-pay | Admitting: *Deleted

## 2011-08-20 MED ORDER — MEGESTROL ACETATE 20 MG PO TABS: 20.0000 mg | ORAL_TABLET | Freq: Two times a day (BID) | ORAL | Status: AC

## 2011-08-20 NOTE — Telephone Encounter (Signed)
Call patient and I would recommend Megace 20 mg twice a day until bleeding minimal then once daily for one week and then stop and we'll see what she does bleeding if it has continued bleeding or recurrent heavy bleeding then office visit. Not sure need to see her right now for this unless she has concern I be happy to see her. Megace order was placed

## 2011-08-20 NOTE — Telephone Encounter (Signed)
Left message on pt vm to make office visit due to heavy bleeding.

## 2011-08-20 NOTE — Telephone Encounter (Signed)
Addended by: Dara Lords on: 08/20/2011 02:09 PM   Modules accepted: Orders

## 2011-08-20 NOTE — Telephone Encounter (Signed)
Left message for pt to call.

## 2011-08-20 NOTE — Telephone Encounter (Signed)
Spoke with pt about the below note and she will try megace to see if that works if not then she will make OV.

## 2011-08-20 NOTE — Telephone Encounter (Signed)
Pt is 1 month postop from HerOption endometrial ablation. She noted of heavy bleeding that is not slowing down, pt said she is changing pad very frequent. Sometimes every 20 mins, pt was seen on 08/08/11 for post op visit.  Please advise

## 2011-08-23 ENCOUNTER — Ambulatory Visit: Payer: BC Managed Care – PPO | Admitting: Gynecology

## 2012-01-14 NOTE — Progress Notes (Signed)
Patient ID: Anne Lewis, female   DOB: 04-Aug-1965, 46 y.o.   MRN: [id number] PT. C-O FREQUENT HEAVY PERIODS AGAIN WITH CLOTS AND SOME DIZZINESS: AND PER TF'S LAST OV NOTE PT NEEDS OV IF PERSISTENT AFTER USING MEGACE. PT, SET UP FOR 01-15-12 A.M. WITH DR. Velvet Bathe. I  ADVISED PT IF SYMPTOMS WORSEN AFTER HOURS TODAY SHE NEEDS TO GO [facility] TO [facility]ED. SHE STATES SHE IS OK TO WAIT TO SEE TF TOMORROW. I ALSO ADVISED HER TO STAY OFF OF HER FEET AND REST AND DRINK PLENTY OF FLUIDS UNTIL SHE IS EVALUATED.

## 2012-01-15 ENCOUNTER — Ambulatory Visit (INDEPENDENT_AMBULATORY_CARE_PROVIDER_SITE_OTHER): Payer: BC Managed Care – PPO | Admitting: Gynecology

## 2012-01-15 ENCOUNTER — Encounter: Payer: Self-pay | Admitting: Gynecology

## 2012-01-15 DIAGNOSIS — N926 Irregular menstruation, unspecified: Secondary | ICD-10-CM

## 2012-01-15 DIAGNOSIS — N946 Dysmenorrhea, unspecified: Secondary | ICD-10-CM

## 2012-01-15 LAB — CBC WITH DIFFERENTIAL/PLATELET
Basophils Absolute: 0 10*3/uL (ref 0.0–0.1)
Basophils Relative: 1 % (ref 0–1)
Eosinophils Absolute: 0.2 10*3/uL (ref 0.0–0.7)
Eosinophils Relative: 3 % (ref 0–5)
Lymphocytes Relative: 31 % (ref 12–46)
MCHC: 33.9 g/dL (ref 30.0–36.0)
MCV: 88.7 fL (ref 78.0–100.0)
Platelets: 389 10*3/uL (ref 150–400)
RDW: 13.3 % (ref 11.5–15.5)
WBC: 5.8 10*3/uL (ref 4.0–10.5)

## 2012-01-15 NOTE — Patient Instructions (Signed)
Follow up for ultrasound as scheduled 

## 2012-01-15 NOTE — Progress Notes (Signed)
Patient presents having undergone HerOption endometrial ablation in December has had some sporadic menses since then up to twice monthly and then most recently had a heavy bleed where blood ran down her legs. Also some cramping. Is sexually active but her partner has vasectomy.  Exam with Sherrilyn Rist Asst. Abdomen soft nontender without masses guarding rebound organomegaly. Pelvic external BUS vagina with scant bleeding. Cervix normal. Uterus retroverted normal size midline mobile nontender. Adnexa without masses or tenderness.  Assessment and plan: Irregular menses, menorrhagia, dysmenorrhea status post endometrial ablation. Will check baseline CBC and ultrasound rule out hematometria. Options for management to include hormonal manipulation, reablation, attempt at IUD placement, hysterectomy reviewed. Uterus is low and I think vaginal hysterectomy would be possible. She does have 2 prior cesareans and the options for LAVH up to and including TAH were also reviewed with her. Patient will follow up for her ultrasound and then we'll further discuss options. I did discuss whether to do a sonohysterogram to assess cavity shape if she would consider reablation or IUD but she does not feel the she would pursue this and will start with a simple transvaginal ultrasound.

## 2012-01-17 ENCOUNTER — Encounter: Payer: Self-pay | Admitting: Gynecology

## 2012-01-17 ENCOUNTER — Ambulatory Visit (INDEPENDENT_AMBULATORY_CARE_PROVIDER_SITE_OTHER): Payer: BC Managed Care – PPO

## 2012-01-17 ENCOUNTER — Ambulatory Visit (INDEPENDENT_AMBULATORY_CARE_PROVIDER_SITE_OTHER): Payer: BC Managed Care – PPO | Admitting: Gynecology

## 2012-01-17 VITALS — BP 120/80

## 2012-01-17 DIAGNOSIS — N926 Irregular menstruation, unspecified: Secondary | ICD-10-CM

## 2012-01-17 DIAGNOSIS — N920 Excessive and frequent menstruation with regular cycle: Secondary | ICD-10-CM

## 2012-01-17 DIAGNOSIS — D259 Leiomyoma of uterus, unspecified: Secondary | ICD-10-CM

## 2012-01-17 NOTE — Patient Instructions (Signed)
Follow up with decision about hysterectomy. 

## 2012-01-17 NOTE — Progress Notes (Signed)
Patient follows up for ultrasound with her history of irregular and heavy bleeding status post endometrial ablation. Ultrasound shows several small myomas right/left ovaries normal. Endometrial echo of 5.5 mm with no evidence of hematometria. Review this with the patient and again the options for management of her irregular heavy bleeding. Patient is leaning towards hysterectomy and she wants to check at work as for his time off and will call us when she is ready to schedule. If irregular bleeding or heavy bleeding continues I discussed possible Megace suppression to temporize until surgery.

## 2013-10-20 ENCOUNTER — Ambulatory Visit (INDEPENDENT_AMBULATORY_CARE_PROVIDER_SITE_OTHER): Payer: BC Managed Care – PPO | Admitting: Gynecology

## 2013-10-20 ENCOUNTER — Encounter: Payer: Self-pay | Admitting: Gynecology

## 2013-10-20 ENCOUNTER — Telehealth: Payer: Self-pay | Admitting: *Deleted

## 2013-10-20 ENCOUNTER — Other Ambulatory Visit (HOSPITAL_COMMUNITY)
Admission: RE | Admit: 2013-10-20 | Discharge: 2013-10-20 | Disposition: A | Discharge status: Home / Self Care | Payer: BC Managed Care – PPO | Source: Ambulatory Visit | Attending: Gynecology | Admitting: Gynecology

## 2013-10-20 VITALS — BP 120/74 | Ht 68.5 in | Wt 169.0 lb

## 2013-10-20 DIAGNOSIS — Z124 Encounter for screening for malignant neoplasm of cervix: Secondary | ICD-10-CM | POA: Insufficient documentation

## 2013-10-20 DIAGNOSIS — N926 Irregular menstruation, unspecified: Secondary | ICD-10-CM

## 2013-10-20 DIAGNOSIS — Z1151 Encounter for screening for human papillomavirus (HPV): Secondary | ICD-10-CM | POA: Insufficient documentation

## 2013-10-20 DIAGNOSIS — Z01419 Encounter for gynecological examination (general) (routine) without abnormal findings: Secondary | ICD-10-CM

## 2013-10-20 LAB — COMPREHENSIVE METABOLIC PANEL
ALT: 16 U/L (ref 0–35)
AST: 15 U/L (ref 0–37)
Albumin: 4.6 g/dL (ref 3.5–5.2)
Alkaline Phosphatase: 49 U/L (ref 39–117)
BUN: 14 mg/dL (ref 6–23)
CALCIUM: 9.9 mg/dL (ref 8.4–10.5)
CHLORIDE: 100 meq/L (ref 96–112)
CO2: 27 meq/L (ref 19–32)
CREATININE: 0.76 mg/dL (ref 0.50–1.10)
GLUCOSE: 81 mg/dL (ref 70–99)
Potassium: 4.3 mEq/L (ref 3.5–5.3)
Sodium: 136 mEq/L (ref 135–145)
Total Bilirubin: 0.5 mg/dL (ref 0.2–1.2)
Total Protein: 7.2 g/dL (ref 6.0–8.3)

## 2013-10-20 LAB — CBC WITH DIFFERENTIAL/PLATELET
Basophils Absolute: 0.1 10*3/uL (ref 0.0–0.1)
Basophils Relative: 1 % (ref 0–1)
EOS PCT: 1 % (ref 0–5)
Eosinophils Absolute: 0.1 10*3/uL (ref 0.0–0.7)
HEMATOCRIT: 44.6 % (ref 36.0–46.0)
Hemoglobin: 15 g/dL (ref 12.0–15.0)
LYMPHS ABS: 1.7 10*3/uL (ref 0.7–4.0)
LYMPHS PCT: 21 % (ref 12–46)
MCH: 30.2 pg (ref 26.0–34.0)
MCHC: 33.6 g/dL (ref 30.0–36.0)
MCV: 89.7 fL (ref 78.0–100.0)
MONO ABS: 0.5 10*3/uL (ref 0.1–1.0)
MONOS PCT: 6 % (ref 3–12)
Neutro Abs: 5.6 10*3/uL (ref 1.7–7.7)
Neutrophils Relative %: 71 % (ref 43–77)
Platelets: 370 10*3/uL (ref 150–400)
RBC: 4.97 MIL/uL (ref 3.87–5.11)
RDW: 13.4 % (ref 11.5–15.5)
WBC: 7.9 10*3/uL (ref 4.0–10.5)

## 2013-10-20 LAB — LIPID PANEL
CHOL/HDL RATIO: 3.4 ratio
CHOLESTEROL: 191 mg/dL (ref 0–200)
HDL: 57 mg/dL (ref >39)
LDL Cholesterol: 119 mg/dL — ABNORMAL HIGH (ref 0–99)
TRIGLYCERIDES: 75 mg/dL (ref <150)
VLDL: 15 mg/dL (ref 0–40)

## 2013-10-20 NOTE — Progress Notes (Signed)
Anne Lewis 01-31-1966 [id number]        48 y.o.  G3P3 for annual exam.  Several issues noted below.  Past medical history,surgical history, problem list, medications, allergies, family history and social history were all reviewed and documented in the EPIC chart.  ROS:  Performed and pertinent positives and negatives are included in the history, assessment and plan .  Exam: Kim assistant Filed Vitals:   10/20/13 1130  BP: 120/74  Height: 5' 8.5" (1.74 m)  Weight: 169 lb (76.658 kg)   General appearance  Normal Skin grossly normal Head/Neck normal with no cervical or supraclavicular adenopathy thyroid normal Lungs  clear Cardiac RR, without RMG Abdominal  soft, nontender, without masses, organomegaly or hernia Breasts  examined lying and sitting without masses, retractions, discharge or axillary adenopathy. Pelvic  Ext/BUS/vagina normal.  Cervix normal. Pap/HPV  Uterus anteverted, normal size, shape and contour, midline and mobile nontender   Adnexa  Without masses or tenderness    Anus and perineum  Normal   Rectovaginal  Normal sphincter tone without palpated masses or tenderness.    Assessment/Plan:  48 y.o. G3P3 female for annual exam irregular menses, vasectomy birth control.   1. Irregular menses. Patient's menses continue irregular status post her option endometrial ablation 2012. She will bleed up to 3 times monthly. Never heavy. Very little pain. Ultrasound last year showed multiple small myomas otherwise normal with endometrial echo 5.5 mm. No evidence of an endometrial. Patient was contemplating hysterectomy but never followed up for this. Recommend sonohysterogram to assess cavity. Possibilities to include proceeding with hysterectomy now versus attempted Mirena IUD or other hormonal manipulation. We'll reassess after sonohysterogram. We'll also check baseline CBC TSH and FSH. 2. Strong family history of cancer. Has multiple family members to include maternal aunt  and maternal cousins x2 with breast cancer. Patient's sister in her 29s apparently had ovarian/intestinal type cancer. Her mother also had some form of intestinal cancer. No genetic testing and she is aware of. I strongly recommended she followup with the genetic counselor to consider genetic testing possible BRCA as well as Lynch. Patient agrees with this will help arrange this appointment. Implications as far as risk reductive surgery as well as as far as her children are concerned was discussed. 3. Mammography 2012. Strongly recommended she schedule now she agrees to do so. SBE monthly review. 4. Pap smear 2012. Pap/HPV done today. No history of significant abnormal Pap smears previously. 5. Colonoscopy. Patient's in the process of arranging. Had colonic polyps removed several years ago and they recommended a repeat colonoscopy this year. 6. Health maintenance. Baseline CBC comprehensive metabolic panel lipid profile urinalysis TSH FSH ordered. Followup for lab results and ultrasound.   Note: This document was prepared with digital dictation and possible smart phrase technology. Any transcriptional errors that result from this process are unintentional.   Anastasio Auerbach MD, 11:57 AM 10/20/2013

## 2013-10-20 NOTE — Patient Instructions (Signed)
Office will followup with you in reference to your blood results. Followup for the ultrasound as scheduled. Office will help to arrange an appointment with a genetic counselor reference to your family history of cancer. Schedule and followup for mammogram  You may obtain a copy of any labs that were done today by logging onto MyChart as outlined in the instructions provided with your AVS (after visit summary). The office will not call with normal lab results but certainly if there are any significant abnormalities then we will contact you.   Health Maintenance, Female A healthy lifestyle and preventative care can promote health and wellness.  Maintain regular health, dental, and eye exams.  Eat a healthy diet. Foods like vegetables, fruits, whole grains, low-fat dairy products, and lean protein foods contain the nutrients you need without too many calories. Decrease your intake of foods high in solid fats, added sugars, and salt. Get information about a proper diet from your caregiver, if necessary.  Regular physical exercise is one of the most important things you can do for your health. Most adults should get at least 150 minutes of moderate-intensity exercise (any activity that increases your heart rate and causes you to sweat) each week. In addition, most adults need muscle-strengthening exercises on 2 or more days a week.   Maintain a healthy weight. The body mass index (BMI) is a screening tool to identify possible weight problems. It provides an estimate of body fat based on height and weight. Your caregiver can help determine your BMI, and can help you achieve or maintain a healthy weight. For adults 20 years and older:  A BMI below 18.5 is considered underweight.  A BMI of 18.5 to 24.9 is normal.  A BMI of 25 to 29.9 is considered overweight.  A BMI of 30 and above is considered obese.  Maintain normal blood lipids and cholesterol by exercising and minimizing your intake of  saturated fat. Eat a balanced diet with plenty of fruits and vegetables. Blood tests for lipids and cholesterol should begin at age 27 and be repeated every 5 years. If your lipid or cholesterol levels are high, you are over 50, or you are a high risk for heart disease, you may need your cholesterol levels checked more frequently.Ongoing high lipid and cholesterol levels should be treated with medicines if diet and exercise are not effective.  If you smoke, find out from your caregiver how to quit. If you do not use tobacco, do not start.  Lung cancer screening is recommended for adults aged 106 80 years who are at high risk for developing lung cancer because of a history of smoking. Yearly low-dose computed tomography (CT) is recommended for people who have at least a 30-pack-year history of smoking and are a current smoker or have quit within the past 15 years. A pack year of smoking is smoking an average of 1 pack of cigarettes a day for 1 year (for example: 1 pack a day for 30 years or 2 packs a day for 15 years). Yearly screening should continue until the smoker has stopped smoking for at least 15 years. Yearly screening should also be stopped for people who develop a health problem that would prevent them from having lung cancer treatment.  If you are pregnant, do not drink alcohol. If you are breastfeeding, be very cautious about drinking alcohol. If you are not pregnant and choose to drink alcohol, do not exceed 1 drink per day. One drink is considered to be 12  ounces (355 mL) of beer, 5 ounces (148 mL) of wine, or 1.5 ounces (44 mL) of liquor.  Avoid use of street drugs. Do not share needles with anyone. Ask for help if you need support or instructions about stopping the use of drugs.  High blood pressure causes heart disease and increases the risk of stroke. Blood pressure should be checked at least every 1 to 2 years. Ongoing high blood pressure should be treated with medicines, if weight loss  and exercise are not effective.  If you are 35 to 48 years old, ask your caregiver if you should take aspirin to prevent strokes.  Diabetes screening involves taking a blood sample to check your fasting blood sugar level. This should be done once every 3 years, after age 70, if you are within normal weight and without risk factors for diabetes. Testing should be considered at a younger age or be carried out more frequently if you are overweight and have at least 1 risk factor for diabetes.  Breast cancer screening is essential preventative care for women. You should practice "breast self-awareness." This means understanding the normal appearance and feel of your breasts and may include breast self-examination. Any changes detected, no matter how small, should be reported to a caregiver. Women in their 54s and 30s should have a clinical breast exam (CBE) by a caregiver as part of a regular health exam every 1 to 3 years. After age 31, women should have a CBE every year. Starting at age 25, women should consider having a mammogram (breast X-ray) every year. Women who have a family history of breast cancer should talk to their caregiver about genetic screening. Women at a high risk of breast cancer should talk to their caregiver about having an MRI and a mammogram every year.  Breast cancer gene (BRCA)-related cancer risk assessment is recommended for women who have family members with BRCA-related cancers. BRCA-related cancers include breast, ovarian, tubal, and peritoneal cancers. Having family members with these cancers may be associated with an increased risk for harmful changes (mutations) in the breast cancer genes BRCA1 and BRCA2. Results of the assessment will determine the need for genetic counseling and BRCA1 and BRCA2 testing.  The Pap test is a screening test for cervical cancer. Women should have a Pap test starting at age 34. Between ages 55 and 17, Pap tests should be repeated every 2 years.  Beginning at age 31, you should have a Pap test every 3 years as long as the past 3 Pap tests have been normal. If you had a hysterectomy for a problem that was not cancer or a condition that could lead to cancer, then you no longer need Pap tests. If you are between ages 66 and 80, and you have had normal Pap tests going back 10 years, you no longer need Pap tests. If you have had past treatment for cervical cancer or a condition that could lead to cancer, you need Pap tests and screening for cancer for at least 20 years after your treatment. If Pap tests have been discontinued, risk factors (such as a new sexual partner) need to be reassessed to determine if screening should be resumed. Some women have medical problems that increase the chance of getting cervical cancer. In these cases, your caregiver may recommend more frequent screening and Pap tests.  The human papillomavirus (HPV) test is an additional test that may be used for cervical cancer screening. The HPV test looks for the virus that can cause  the cell changes on the cervix. The cells collected during the Pap test can be tested for HPV. The HPV test could be used to screen women aged 34 years and older, and should be used in women of any age who have unclear Pap test results. After the age of 74, women should have HPV testing at the same frequency as a Pap test.  Colorectal cancer can be detected and often prevented. Most routine colorectal cancer screening begins at the age of 49 and continues through age 52. However, your caregiver may recommend screening at an earlier age if you have risk factors for colon cancer. On a yearly basis, your caregiver may provide home test kits to check for hidden blood in the stool. Use of a small camera at the end of a tube, to directly examine the colon (sigmoidoscopy or colonoscopy), can detect the earliest forms of colorectal cancer. Talk to your caregiver about this at age 26, when routine screening begins.  Direct examination of the colon should be repeated every 5 to 10 years through age 34, unless early forms of pre-cancerous polyps or small growths are found.  Hepatitis C blood testing is recommended for all people born from [birth year] through 1965 and any individual with known risks for hepatitis C.  Practice safe sex. Use condoms and avoid high-risk sexual practices to reduce the spread of sexually transmitted infections (STIs). Sexually active women aged 72 and younger should be checked for Chlamydia, which is a common sexually transmitted infection. Older women with new or multiple partners should also be tested for Chlamydia. Testing for other STIs is recommended if you are sexually active and at increased risk.  Osteoporosis is a disease in which the bones lose minerals and strength with aging. This can result in serious bone fractures. The risk of osteoporosis can be identified using a bone density scan. Women ages 34 and over and women at risk for fractures or osteoporosis should discuss screening with their caregivers. Ask your caregiver whether you should be taking a calcium supplement or vitamin D to reduce the rate of osteoporosis.  Menopause can be associated with physical symptoms and risks. Hormone replacement therapy is available to decrease symptoms and risks. You should talk to your caregiver about whether hormone replacement therapy is right for you.  Use sunscreen. Apply sunscreen liberally and repeatedly throughout the day. You should seek shade when your shadow is shorter than you. Protect yourself by wearing long sleeves, pants, a wide-brimmed hat, and sunglasses year round, whenever you are outdoors.  Notify your caregiver of new moles or changes in moles, especially if there is a change in shape or color. Also notify your caregiver if a mole is larger than the size of a pencil eraser.  Stay current with your immunizations. Document Released: 01/21/2011 Document Revised: 11/02/2012  Document Reviewed: 01/21/2011 [facility] Patient Information 2014 [employer].

## 2013-10-20 NOTE — Telephone Encounter (Signed)
Message copied by Thamas Jaegers on Wed Oct 20, 2013 12:40 PM ------      Message from: Anastasio Auerbach      Created: Wed Oct 20, 2013 12:02 PM       Arrange for genetic counseling at the cancer center. Reference multiple family members with breast cancer and intestinal/ovarian. Assess for genetic screening ------

## 2013-10-20 NOTE — Addendum Note (Signed)
Addended by: Nelva Nay on: 10/20/2013 12:12 PM   Modules accepted: Orders

## 2013-10-20 NOTE — Telephone Encounter (Signed)
Referral faxed to [facility], they will contact patient.

## 2013-10-21 ENCOUNTER — Telehealth: Payer: Self-pay | Admitting: *Deleted

## 2013-10-21 LAB — FOLLICLE STIMULATING HORMONE: FSH: 4 m[IU]/mL

## 2013-10-21 LAB — URINALYSIS W MICROSCOPIC + REFLEX CULTURE
BILIRUBIN URINE: NEGATIVE
Bacteria, UA: NONE SEEN
CRYSTALS: NONE SEEN
Casts: NONE SEEN
GLUCOSE, UA: NEGATIVE mg/dL
Hgb urine dipstick: NEGATIVE
Ketones, ur: NEGATIVE mg/dL
Leukocytes, UA: NEGATIVE
Nitrite: NEGATIVE
PROTEIN: NEGATIVE mg/dL
Specific Gravity, Urine: 1.013 (ref 1.005–1.030)
Urobilinogen, UA: 0.2 mg/dL (ref 0.0–1.0)
pH: 5 (ref 5.0–8.0)

## 2013-10-21 LAB — TSH: TSH: 0.656 u[IU]/mL (ref 0.350–4.500)

## 2013-10-21 NOTE — Telephone Encounter (Signed)
Left message for pt to return my call so I can schedule a genetic appt.  

## 2013-10-21 NOTE — Telephone Encounter (Signed)
Pt returned my call and I confirmed 11/08/13 genetic appt w/ pt.

## 2013-10-29 NOTE — Telephone Encounter (Signed)
Appointment on 11/08/13

## 2013-11-01 ENCOUNTER — Other Ambulatory Visit: Payer: Self-pay | Admitting: Gynecology

## 2013-11-01 DIAGNOSIS — D259 Leiomyoma of uterus, unspecified: Secondary | ICD-10-CM

## 2013-11-01 DIAGNOSIS — N926 Irregular menstruation, unspecified: Secondary | ICD-10-CM

## 2013-11-08 ENCOUNTER — Other Ambulatory Visit: Payer: BC Managed Care – PPO

## 2013-11-08 ENCOUNTER — Ambulatory Visit (HOSPITAL_BASED_OUTPATIENT_CLINIC_OR_DEPARTMENT_OTHER): Payer: BC Managed Care – PPO | Admitting: Genetic Counselor

## 2013-11-08 DIAGNOSIS — Z803 Family history of malignant neoplasm of breast: Secondary | ICD-10-CM

## 2013-11-08 DIAGNOSIS — Z800 Family history of malignant neoplasm of digestive organs: Secondary | ICD-10-CM

## 2013-11-08 DIAGNOSIS — Z8041 Family history of malignant neoplasm of ovary: Secondary | ICD-10-CM | POA: Insufficient documentation

## 2013-11-08 NOTE — Progress Notes (Signed)
HISTORY OF PRESENT ILLNESS: Dr. Phineas Real requested a cancer genetics consultation for Elis Rawlinson, a 48 y.o. female, due to a family history of cancer.  Ms. Hentges presents to clinic today to discuss the possibility of a hereditary predisposition to cancer, genetic testing, and to further clarify her future cancer risks, as well as potential cancer risk for family members. Ms. Prisk has no personal history of cancer.   Past Surgical History  Procedure Laterality Date   Cesarean section  3500,9381   Tonsillectomy  1987   Endometrial cryoablation  12.19.2012    [facility] - office performed   Endometrial ablation     History   Social History   Marital Status: Divorced    Spouse Name: N/A    Number of Children: N/A   Years of Education: N/A   Social History Main Topics   Smoking status: Former Smoker   Smokeless tobacco: Never Used   Alcohol Use: Yes     Comment: Social   Drug Use: No   Sexual Activity: Yes    Birth Control/ Protection: Other-see comments     Comment: Vasectomy   Other Topics Concern   Not on file   Social History Narrative   No narrative on file     FAMILY HISTORY:  During the visit, a 4-generation pedigree was obtained. Significant diagnoses include the following:  Family History  Problem Relation Age of Onset   Hypertension Mother    Cancer Mother 110    intestational and ovarian   Diabetes Father    Hypertension Father    Heart disease Father    Cancer Sister 48    ovarian and intestional   Cancer Sister 55    ovarian or uterine   Cancer Maternal Aunt 62    breast   Cancer Cousin 54    ovarian   Cancer Cousin 47    breast   Cancer Brother 24    GI cancer   Cancer Paternal Aunt 73    stomach   Cancer Maternal Grandmother 79    ovarian and pancreatic   Cancer Maternal Grandfather 73    pancreatic    Ms. Sao's ancestry is of [place] and [place] descent. There is no known Jewish ancestry or  consanguinity.  GENETIC COUNSELING ASSESSMENT: Ms. Krenzer is a 49 y.o. female with a family history of cancer highly suggestive of a hereditary predisposition to cancer. We, therefore, discussed and recommended the following at today's visit.   DISCUSSION: We reviewed the characteristics, features and inheritance patterns of hereditary cancer syndromes. We also discussed genetic testing, including the appropriate family members to test, the process of testing, insurance coverage and turn-around-time for results. We discussed the implications of a negative, positive and/or variant of uncertain significant result. We recommended Ms. Mcconaghy pursue genetic testing for the OvaNext Cancer Gene Panel at [facility].   PLAN: Based on our above recommendation, Ms. Foulk wished to pursue genetic testing and the blood sample was drawn and will be sent to [facility] for analysis of the OvaNext Cancer Gene Panel. Results should be available within approximately 4 weeks time, at which point they will be disclosed by telephone to Ms. Mattes, as will any additional recommendations warranted by these results.   Based on Ms. Marchanys family history, we recommended her sisters, who were reportedly diagnosed with ovarian cancer at early ages, have genetic counseling and testing. We discussed that it is always most informative to initiate genetic testing in a family  member diagnosed with cancer if possible, as it can sometimes help [place] better interpret results for unaffected family members. Ms. Riebe will speak with these family members and let us know if we can be of any assistance in coordinating genetic counseling and/or testing. In the meantime, we recommended Ms. Guardado continue to follow the cancer screening guidelines given to he by her primary provider.  Lastly, we encouraged Ms. Mayer to remain in contact with cancer genetics annually so that we can continuously update the family  history and inform her of any changes in cancer genetics and testing that may be of benefit for this family. Ms.  Kreamer questions were answered to her satisfaction today. Our contact information was provided should additional questions or concerns arise.   Thank you for the referral and allowing [place] to share in the care of your patient.   The patient was seen for a total of 40 minutes in face-to-face counseling.  This patient was discussed with Fontaine who agrees with the above.    _______________________________________________________________________ For Office Staff:  Number of people involved in session: 2 Was an Intern/ student involved with case: no

## 2013-11-10 ENCOUNTER — Other Ambulatory Visit: Payer: BC Managed Care – PPO

## 2013-11-10 ENCOUNTER — Ambulatory Visit: Payer: BC Managed Care – PPO | Admitting: Gynecology

## 2013-11-16 ENCOUNTER — Other Ambulatory Visit: Payer: Self-pay | Admitting: Gynecology

## 2013-11-17 ENCOUNTER — Ambulatory Visit (INDEPENDENT_AMBULATORY_CARE_PROVIDER_SITE_OTHER): Payer: BC Managed Care – PPO

## 2013-11-17 ENCOUNTER — Other Ambulatory Visit: Payer: Self-pay | Admitting: Gynecology

## 2013-11-17 ENCOUNTER — Ambulatory Visit (INDEPENDENT_AMBULATORY_CARE_PROVIDER_SITE_OTHER): Payer: BC Managed Care – PPO | Admitting: Gynecology

## 2013-11-17 ENCOUNTER — Encounter: Payer: Self-pay | Admitting: Gynecology

## 2013-11-17 DIAGNOSIS — N938 Other specified abnormal uterine and vaginal bleeding: Secondary | ICD-10-CM

## 2013-11-17 DIAGNOSIS — N852 Hypertrophy of uterus: Secondary | ICD-10-CM

## 2013-11-17 DIAGNOSIS — N831 Corpus luteum cyst of ovary, unspecified side: Secondary | ICD-10-CM

## 2013-11-17 DIAGNOSIS — D259 Leiomyoma of uterus, unspecified: Secondary | ICD-10-CM

## 2013-11-17 DIAGNOSIS — Z9889 Other specified postprocedural states: Secondary | ICD-10-CM

## 2013-11-17 DIAGNOSIS — N926 Irregular menstruation, unspecified: Secondary | ICD-10-CM

## 2013-11-17 DIAGNOSIS — N949 Unspecified condition associated with female genital organs and menstrual cycle: Secondary | ICD-10-CM

## 2013-11-17 NOTE — Patient Instructions (Addendum)
Office will call you with biopsy results. Think about the options that we discussed to manage the bleeding from observation, trial of hormonal treatments such as birth control pills, hysterectomy.

## 2013-11-17 NOTE — Progress Notes (Signed)
Anne Lewis May 28, 1966 [id number]        48 y.o.  Anne Lewis presents for sonohysterogram due to persistent irregular bleeding status post endometrial ablation.  Past medical history,surgical history, problem list, medications, allergies, family history and social history were all reviewed and documented in the EPIC chart.  Directed ROS with pertinent positives and negatives documented in the history of present illness/assessment and plan.  Exam: [facility] [place] assistant General appearance  Normal External BUS vagina normal. Cervix normal. Uterus retroverted normal size midline mobile nontender. Adnexa without masses or tenderness.  Ultrasound normal size. Myometrium heterogeneous without evidence of leiomyoma. Endometrial echo 4.2 mm. Right left ovaries with physiologic changes. Cul-de-sac negative.  Sonohysterogram performed, sterile technique, easy catheter introduction with no distention under pressure. Endometrial sample taken. Patient tolerated well.  Assessment/Plan:  48 y.o. G3P3 persistent irregular bleeding status post HerOption endometrial ablation. Ultrasound shows scarring of the cavity unable to distend. Endometrial echo is thin. Patient will followup for endometrial biopsy results. Options for management include observation, hormonal manipulation such as low-dose oral contraceptives, hysterectomy. Do not think repeat ablation or IUD possible given the scarring of the cavity and unable to distend. Patient will think of her options and followup for the biopsy results   Note: This document was prepared with digital dictation and possible smart phrase technology. Any transcriptional errors that result from this process are unintentional.   Anastasio Auerbach MD, 11:07 AM 11/17/2013

## 2013-11-19 DIAGNOSIS — Z1371 Encounter for nonprocreative screening for genetic disease carrier status: Secondary | ICD-10-CM

## 2013-11-19 HISTORY — DX: Encounter for nonprocreative screening for genetic disease carrier status: Z13.71

## 2013-12-08 ENCOUNTER — Encounter: Payer: Self-pay | Admitting: Genetic Counselor

## 2013-12-08 DIAGNOSIS — Z8041 Family history of malignant neoplasm of ovary: Secondary | ICD-10-CM

## 2013-12-08 DIAGNOSIS — Z800 Family history of malignant neoplasm of digestive organs: Secondary | ICD-10-CM

## 2013-12-08 DIAGNOSIS — Z803 Family history of malignant neoplasm of breast: Secondary | ICD-10-CM

## 2013-12-08 NOTE — Progress Notes (Signed)
HPI:  Ms. Styles was previously seen in the [facility] due to a family history of cancer and concerns regarding a hereditary predisposition to cancer. Please refer to our prior cancer genetics clinic note for more information regarding Ms. Rihn's medical, social and family histories, and our assessment and recommendations, at the time. Ms. Hejl recent genetic test results were disclosed to her, as were recommendations warranted by these results. These results and recommendations are discussed in more detail below.  GENETIC TEST RESULTS: At the time of Ms. Kaucher's visit, we recommended she pursue genetic testing of the OvaNext gene panel. This test, which included sequencing and deletion/duplication analysis of the genes listed on the test report, was performed at [facility]. Genetic testing was normal, and did not reveal a mutation in these genes. A complete list of all genes tested is located on the test report scanned into EPIC.    We discussed with Ms. Urbanek that since the current genetic testing is not perfect, it is possible there may be a gene mutation in one of these genes that current testing cannot detect, but that chance is small.  We also discussed, that it is possible that another gene that has not yet been discovered, or that we have not yet tested, is responsible for the cancer diagnoses in the family, and it is, therefore, important to remain in touch with cancer genetics in the future so that we can continue to offer Ms. Pebley the most up to date genetic testing.   CANCER SCREENING RECOMMENDATIONS: This normal result is generally reassuring and indicates that Ms. Jamerson does not likely have an increased risk of cancer due to a a mutation in one of these genes.  We, therefore, recommended  Ms. Salvaggio continue to follow the cancer screening guidelines provided by her primary healthcare providers.   RECOMMENDATIONS FOR FAMILY MEMBERS:  We  strongly recommended further genetic testing in Ms. Noxon's family as such testing might help [place] be even more confident in interpreting Ms. Gasser's own normal results. Genetic testing is best initiated in someone who has had cancer.  In this family, Ms. Lwin sisters or maternal relatives with cancer would be the most informative family members to test. Please let us know if we can help facilitate testing. Genetic counselors can be located in other cities, by visiting the website of the [employer] (ArtistMovie.se) and Field seismologist for a Dietitian by zip code.  FOLLOW-UP: Lastly, we discussed with Ms. Duquette that cancer genetics is a rapidly advancing field and it is possible that new genetic tests will be appropriate for her and/or her family members in the future. We encouraged her to remain in contact with cancer genetics on an annual basis so we can update her personal and family histories and let her know of advances in cancer genetics that may benefit this family.   Our contact number was provided. Ms. Prickett questions were answered to her satisfaction, and she knows she is welcome to call us at anytime with additional questions or concerns. This patient was discussed with Dr. Phineas Real who agrees with the above.   Catherine A. Fine, MS, CGC Certified Genetic Counseor phone: [phone] [email or web address]

## 2013-12-12 ENCOUNTER — Encounter: Payer: Self-pay | Admitting: Gynecology

## 2014-05-23 ENCOUNTER — Encounter: Payer: Self-pay | Admitting: Gynecology

## 2015-08-07 ENCOUNTER — Encounter: Payer: Self-pay | Admitting: Gastroenterology

## 2016-08-07 ENCOUNTER — Encounter: Payer: Self-pay | Admitting: Gynecology

## 2016-08-12 ENCOUNTER — Encounter: Payer: Self-pay | Admitting: Gynecology

## 2016-08-12 ENCOUNTER — Ambulatory Visit (INDEPENDENT_AMBULATORY_CARE_PROVIDER_SITE_OTHER): Payer: BLUE CROSS/BLUE SHIELD | Admitting: Gynecology

## 2016-08-12 VITALS — BP 118/74 | Ht 69.0 in | Wt 162.0 lb

## 2016-08-12 DIAGNOSIS — A599 Trichomoniasis, unspecified: Secondary | ICD-10-CM

## 2016-08-12 DIAGNOSIS — B373 Candidiasis of vulva and vagina: Secondary | ICD-10-CM

## 2016-08-12 DIAGNOSIS — B3731 Acute candidiasis of vulva and vagina: Secondary | ICD-10-CM

## 2016-08-12 DIAGNOSIS — Z01411 Encounter for gynecological examination (general) (routine) with abnormal findings: Secondary | ICD-10-CM

## 2016-08-12 DIAGNOSIS — N926 Irregular menstruation, unspecified: Secondary | ICD-10-CM | POA: Diagnosis not present

## 2016-08-12 DIAGNOSIS — Z1321 Encounter for screening for nutritional disorder: Secondary | ICD-10-CM | POA: Diagnosis not present

## 2016-08-12 DIAGNOSIS — Z13220 Encounter for screening for lipoid disorders: Secondary | ICD-10-CM | POA: Diagnosis not present

## 2016-08-12 DIAGNOSIS — R5383 Other fatigue: Secondary | ICD-10-CM

## 2016-08-12 DIAGNOSIS — N898 Other specified noninflammatory disorders of vagina: Secondary | ICD-10-CM | POA: Diagnosis not present

## 2016-08-12 LAB — LIPID PANEL
CHOL/HDL RATIO: 3.6 ratio (ref <5.0)
Cholesterol: 178 mg/dL (ref <200)
HDL: 50 mg/dL — AB (ref >50)
LDL CALC: 115 mg/dL — AB (ref <100)
TRIGLYCERIDES: 66 mg/dL (ref <150)
VLDL: 13 mg/dL (ref <30)

## 2016-08-12 LAB — CBC WITH DIFFERENTIAL/PLATELET
BASOS ABS: 99 {cells}/uL (ref 0–200)
Basophils Relative: 1 %
EOS PCT: 1 %
Eosinophils Absolute: 99 cells/uL (ref 15–500)
HEMATOCRIT: 43 % (ref 35.0–45.0)
HEMOGLOBIN: 14.2 g/dL (ref 11.7–15.5)
LYMPHS ABS: 1584 {cells}/uL (ref 850–3900)
Lymphocytes Relative: 16 %
MCH: 30.1 pg (ref 27.0–33.0)
MCHC: 33 g/dL (ref 32.0–36.0)
MCV: 91.3 fL (ref 80.0–100.0)
MPV: 9.4 fL (ref 7.5–12.5)
Monocytes Absolute: 495 cells/uL (ref 200–950)
Monocytes Relative: 5 %
NEUTROS PCT: 77 %
Neutro Abs: 7623 cells/uL (ref 1500–7800)
Platelets: 370 10*3/uL (ref 140–400)
RBC: 4.71 MIL/uL (ref 3.80–5.10)
RDW: 13 % (ref 11.0–15.0)
WBC: 9.9 10*3/uL (ref 3.8–10.8)

## 2016-08-12 LAB — URINALYSIS W MICROSCOPIC + REFLEX CULTURE
BILIRUBIN URINE: NEGATIVE
CASTS: NONE SEEN [LPF]
Crystals: NONE SEEN [HPF]
Glucose, UA: NEGATIVE
HGB URINE DIPSTICK: NEGATIVE
Nitrite: NEGATIVE
Protein, ur: NEGATIVE
RBC / HPF: NONE SEEN RBC/HPF (ref <=2)
Specific Gravity, Urine: 1.028 (ref 1.001–1.035)
Yeast: NONE SEEN [HPF]
pH: 5.5 (ref 5.0–8.0)

## 2016-08-12 LAB — WET PREP FOR TRICH, YEAST, CLUE

## 2016-08-12 LAB — COMPREHENSIVE METABOLIC PANEL
ALBUMIN: 4.4 g/dL (ref 3.6–5.1)
ALT: 12 U/L (ref 6–29)
AST: 12 U/L (ref 10–35)
Alkaline Phosphatase: 45 U/L (ref 33–130)
BUN: 19 mg/dL (ref 7–25)
CALCIUM: 9.6 mg/dL (ref 8.6–10.4)
CHLORIDE: 104 mmol/L (ref 98–110)
CO2: 23 mmol/L (ref 20–31)
Creat: 0.91 mg/dL (ref 0.50–1.05)
GLUCOSE: 134 mg/dL — AB (ref 65–99)
Potassium: 4.1 mmol/L (ref 3.5–5.3)
SODIUM: 137 mmol/L (ref 135–146)
Total Bilirubin: 0.7 mg/dL (ref 0.2–1.2)
Total Protein: 6.9 g/dL (ref 6.1–8.1)

## 2016-08-12 LAB — TSH: TSH: 0.87 m[IU]/L

## 2016-08-12 LAB — FOLLICLE STIMULATING HORMONE: FSH: 13.8 m[IU]/mL

## 2016-08-12 MED ORDER — FLUCONAZOLE 150 MG PO TABS: 150.0000 mg | ORAL_TABLET | Freq: Once | ORAL | 0 refills | Status: AC

## 2016-08-12 MED ORDER — METRONIDAZOLE 500 MG PO TABS: 500.0000 mg | ORAL_TABLET | Freq: Two times a day (BID) | ORAL | 0 refills | Status: DC

## 2016-08-12 NOTE — Progress Notes (Signed)
Anne Lewis 03/21/1966 [id number]        50 y.o.  G3P3 for annual exam.  Also notes some irregularity in her menses as as well as fatigue discussed below.  Past medical history,surgical history, problem list, medications, allergies, family history and social history were all reviewed and documented as reviewed in the EPIC chart.  ROS:  Performed with pertinent positives and negatives included in the history, assessment and plan.   Additional significant findings :  None   Exam: Anne Lewis assistant Vitals:   08/12/16 0802  BP: 118/74  Weight: 162 lb (73.5 kg)  Height: 5\' 9"  (1.753 m)   Body mass index is 23.92 kg/m.  General appearance:  Normal affect, orientation and appearance. Skin: Grossly normal HEENT: Without gross lesions.  No cervical or supraclavicular adenopathy. Thyroid normal.  Lungs:  Clear without wheezing, rales or rhonchi Cardiac: RR, without RMG Abdominal:  Soft, nontender, without masses, guarding, rebound, organomegaly or hernia Breasts:  Examined lying and sitting without masses, retractions, discharge or axillary adenopathy. Pelvic:  Ext, BUS, Vagina with white cakey discharge  Cervix normal  Uterus retroverted, normal size, shape and contour, midline and mobile nontender   Adnexa without masses or tenderness    Anus and perineum normal   Rectovaginal normal sphincter tone without palpated masses or tenderness.    Assessment/Plan:  51 y.o. G3P3 female for annual exam with mild irregular menses, vasectomy birth control.   1. Irregular menses. Patient notes over the past year or so her menses have varied as far as coming will bit earlier or a bit later in her cycle. Sometimes lasting 3 days sometimes 5 days. No prolonged spotting or atypical bleeding. No significant hot flushes or night sweats. Will check baseline TSH and FSH. At this point will keep menstrual calendar and as long as relatively regular monthly then will follow. If prolonged or  atypical bleeding she'll represent for evaluation. 2. Vaginal discharge. Noted on exam. Patient denies discharge or odor irritation. Wet prep is positive for yeast trichomonas and many clue cells. Will treat with Diflucan 150 mg 1 dose and Flagyl 500 mg twice a day 7 days. Possible STD nature of trichomonas reviewed. Will check baseline GC and chlamydia. Recommended her partner call his physician for treatment and evaluation. 3. Fatigue. Over the past year so patient notes being tired. No hair skin or weight changes. No diarrhea or constipation. No sweats or fevers. Does not exercise on a regular basis. Will check baseline CBC comprehensive metabolic panel TSH and FSH. Increasing exercise reviewed. 4. Strong family history of cancer with breast and possible ovarian cancer as well as GI. Underwent genetic testing 2015 with negative genetic screening. 5. Mammography 2012. Strongly recommended patient schedule a screening mammogram particular given her family history. Patient agrees to schedule. SBE monthly reviewed. 6. Colonoscopy 2011. Given her family's history of asked her to call to see if she is due for her colonoscopy now at a shorter than 10 year interval and she agrees to call and check on this. 7. Pap smear/HPV 2015 negative. No Pap smear done today. No history of significant abnormal Pap smears. 8. Health maintenance. Patient requests baseline lab work. CBC, CMP, lipid profile, TSH, FSH vitamin D, GC/Chlamydia, urinalysis.  Additional time in excess of her routine gynecologic exam was spent in direct face to face counseling and coordination of care in regards to her irregular menses, vaginal discharge and fatigue.    Anne Auerbach MD, 8:25 AM 08/12/2016

## 2016-08-12 NOTE — Patient Instructions (Addendum)
Take the metronidazole antibiotic twice daily for 7 days to treat the Trichomonas  Take the Diflucan pill once to treat the yeast    Call to Schedule your mammogram  Facilities in [place]: 1)  [facility]. [address] [place] Phone: [phone] 2)  Dr. Isaiah Blakes at [facility] [address] Phone: [phone]     Mammogram A mammogram is an X-ray test to find changes in a woman's breast. You should get a mammogram if:  You are 53 years of age or older  You have risk factors.   Your doctor recommends that you have one.  BEFORE THE TEST  Do not schedule the test the week before your period, especially if your breasts are sore during this time.  On the day of your mammogram:  Wash your breasts and armpits well. After washing, do not put on any deodorant or talcum powder on until after your test.   Eat and drink as you usually do.   Take your medicines as usual.   If you are diabetic and take insulin, make sure you:   Eat before coming for your test.   Take your insulin as usual.   If you cannot keep your appointment, call before the appointment to cancel. Schedule another appointment.  TEST  You will need to undress from the waist up. You will put on a hospital gown.   Your breast will be put on the mammogram machine, and it will press firmly on your breast with a piece of plastic called a compression paddle. This will make your breast flatter so that the machine can X-ray all parts of your breast.   Both breasts will be X-rayed. Each breast will be X-rayed from above and from the side. An X-ray might need to be taken again if the picture is not good enough.   The mammogram will last about 15 to 30 minutes.  AFTER THE TEST Finding out the results of your test Ask when your test results will be ready. Make sure you get your test results.  Document Released: 10/04/2008 Document Revised:  06/27/2011 Document Reviewed: 10/04/2008 [facility] Patient Information 2012 [place].

## 2016-08-12 NOTE — Addendum Note (Signed)
Addended by: Nelva Nay on: 08/12/2016 08:57 AM   Modules accepted: Orders

## 2016-08-13 ENCOUNTER — Other Ambulatory Visit: Payer: Self-pay | Admitting: Gynecology

## 2016-08-13 DIAGNOSIS — E7800 Pure hypercholesterolemia, unspecified: Secondary | ICD-10-CM

## 2016-08-13 DIAGNOSIS — R7309 Other abnormal glucose: Secondary | ICD-10-CM

## 2016-08-13 DIAGNOSIS — E559 Vitamin D deficiency, unspecified: Secondary | ICD-10-CM

## 2016-08-13 LAB — GC/CHLAMYDIA PROBE AMP
CT Probe RNA: NOT DETECTED
GC Probe RNA: NOT DETECTED

## 2016-08-13 LAB — VITAMIN D 25 HYDROXY (VIT D DEFICIENCY, FRACTURES): Vit D, 25-Hydroxy: 35 ng/mL (ref 30–100)

## 2016-08-14 LAB — URINE CULTURE

## 2016-10-17 ENCOUNTER — Other Ambulatory Visit: Payer: Self-pay

## 2016-10-21 ENCOUNTER — Other Ambulatory Visit: Payer: BLUE CROSS/BLUE SHIELD

## 2017-01-18 ENCOUNTER — Emergency Department (HOSPITAL_BASED_OUTPATIENT_CLINIC_OR_DEPARTMENT_OTHER)
Admission: EM | Admit: 2017-01-18 | Discharge: 2017-01-18 | Disposition: A | Discharge status: Home / Self Care | Payer: Self-pay | Attending: Emergency Medicine | Admitting: Emergency Medicine

## 2017-01-18 ENCOUNTER — Encounter (HOSPITAL_BASED_OUTPATIENT_CLINIC_OR_DEPARTMENT_OTHER): Payer: Self-pay | Admitting: *Deleted

## 2017-01-18 DIAGNOSIS — F17200 Nicotine dependence, unspecified, uncomplicated: Secondary | ICD-10-CM | POA: Insufficient documentation

## 2017-01-18 DIAGNOSIS — L0231 Cutaneous abscess of buttock: Secondary | ICD-10-CM | POA: Insufficient documentation

## 2017-01-18 HISTORY — DX: Peptic ulcer, site unspecified, unspecified as acute or chronic, without hemorrhage or perforation: K27.9

## 2017-01-18 HISTORY — DX: Helicobacter pylori (H. pylori) as the cause of diseases classified elsewhere: B96.81

## 2017-01-18 HISTORY — DX: Gastric ulcer, unspecified as acute or chronic, without hemorrhage or perforation: K25.9

## 2017-01-18 MED ORDER — SULFAMETHOXAZOLE-TRIMETHOPRIM 800-160 MG PO TABS
1.0000 | ORAL_TABLET | Freq: Once | ORAL | Status: AC
Start: 2017-01-18 — End: 2017-01-18
  Administered 2017-01-18: 1 via ORAL
  Filled 2017-01-18: qty 1

## 2017-01-18 MED ORDER — SULFAMETHOXAZOLE-TRIMETHOPRIM 800-160 MG PO TABS: 1.0000 | ORAL_TABLET | Freq: Two times a day (BID) | ORAL | 0 refills | Status: AC

## 2017-01-18 MED ORDER — LIDOCAINE HCL (PF) 1 % IJ SOLN
5.0000 mL | Freq: Once | INTRAMUSCULAR | Status: AC
  Administered 2017-01-18: 5 mL
  Filled 2017-01-18: qty 5

## 2017-01-18 MED ORDER — OXYCODONE-ACETAMINOPHEN 5-325 MG PO TABS
1.0000 | ORAL_TABLET | Freq: Once | ORAL | Status: AC
  Administered 2017-01-18: 1 via ORAL
  Filled 2017-01-18: qty 1

## 2017-01-18 NOTE — ED Triage Notes (Signed)
Patient states she was bit by an insect several days ago, and for the three days has developed an abscess on the rectum and upper leg.

## 2017-01-18 NOTE — Discharge Instructions (Signed)
Please read and follow all provided instructions.  Your diagnoses today include:  1. Abscess, gluteal, left     Tests performed today include: Vital signs. See below for your results today.   Medications prescribed:   Take any prescribed medications only as directed.   Home care instructions:  Follow any educational materials contained in this packet  Follow-up instructions: Return to the Emergency Department in 48 hours for a recheck if your symptoms are not significantly improved.  Please follow-up with your primary care provider in the next 1 week for further evaluation of your symptoms.   Return instructions:  Return to the Emergency Department if you have: Fever Worsening symptoms Worsening pain Worsening swelling Redness of the skin that moves away from the affected area, especially if it streaks away from the affected area  Any other emergent concerns  Additional Information: If you have recurrent abscesses, try both the following. Use a Qtip to apply an over-the-counter antibiotic to the inside of your nostrils, twice a day for 5 days. Wash your body with over-the-counter Hibaclens once a day for one week and then once every two weeks. This can reduce the amount of bacterial on your skin that causes boils and lead to fewer boils. If you continue to have multiple or recurrent boils, you should see a dermatologist (skin doctor).   Your vital signs today were: BP (!) 141/72 (BP Location: Left Arm)    Pulse 86    Temp 98.2 F (36.8 C)    Resp 20    Ht 5\' 9"  (1.753 m)    Wt 70.8 kg (156 lb)    LMP 01/08/2017 (Exact Date)    SpO2 100%    BMI 23.04 kg/m  If your blood pressure (BP) was elevated above 135/85 this visit, please have this repeated by your doctor within one month. --------------

## 2017-01-18 NOTE — ED Provider Notes (Signed)
[facility] Provider Note   CSN: [id number] Arrival date & time: 01/18/17  1046     History   Chief Complaint Chief Complaint  Patient presents with  . Abscess    buttocks    HPI Anne Lewis is a 51 y.o. female.  HPI  51 y.o. female, presents to the Emergency Department today due to abscess to left buttock x 2-3 days. Noted insect bite to area after being at a campfire x 1.5 weeks ago. Notes area gradually growing and becoming increasingly painful. No fevers. No redness to area. Notes pain with sitting and ambulation. No meds PTA. Notes pain 5/10 with palpation. Minimal at rest. No rectal pain or pain with BMs. No other symptoms noted.   Past Medical History:  Diagnosis Date  . BRCA negative 11/2013  . H pylori ulcer   . Ulcer of gastric fundus     Patient Active Problem List   Diagnosis Date Noted  . Family history of malignant neoplasm of breast 11/08/2013  . Family history of malignant neoplasm of gastrointestinal tract 11/08/2013  . Family history of malignant neoplasm of ovary 11/08/2013  . RUQ PAIN 08/08/2010  . DYSPEPSIA&OTHER [facility] DISORDERS FUNCTION STOMACH 08/07/2010  . ABDOMINAL PAIN, EPIGASTRIC 08/07/2010  . OTHER CONSTIPATION 12/05/2009    Past Surgical History:  Procedure Laterality Date  . CESAREAN SECTION  [id number]  . ENDOMETRIAL CRYOABLATION  12.19.2012   HER OPTION - office performed  . TONSILLECTOMY  1987    OB History    Gravida Para Term Preterm AB Living   '3 3       3   ' SAB TAB Ectopic Multiple Live Births                   Home Medications    Prior to Admission medications   Medication Sig Start Date End Date Taking? Authorizing Provider  metroNIDAZOLE (FLAGYL) 500 MG tablet Take 1 tablet (500 mg total) by mouth 2 (two) times daily. For 7 days.  Avoid alcohol while taking 08/12/16   Fontaine, Belinda Block, MD    Family History Family History  Problem Relation Age of Onset  . Cancer Cousin 39       ovarian  .  Cancer Cousin 34       breast  . Hypertension Mother   . Cancer Mother 44       intestational and ovarian  . Diverticulitis Mother   . Diabetes Father   . Hypertension Father   . Heart disease Father   . Cancer Sister 62       ovarian and intestional  . Breast cancer Maternal Aunt 60  . Cancer Brother 24       GI cancer  . Cancer Paternal Aunt 13       stomach  . Cancer Maternal Grandmother 19       ovarian and pancreatic  . Cancer Maternal Grandfather 68       pancreatic    Social History Social History  Substance Use Topics  . Smoking status: Current Some Day Smoker  . Smokeless tobacco: Never Used  . Alcohol use Yes     Comment: Social     Allergies   Aspirin   Review of Systems Review of Systems ROS reviewed and all are negative for acute change except as noted in the HPI.  Physical Exam Updated Vital Signs BP (!) 141/72 (BP Location: Left Arm)   Pulse 86   Temp 98.2 F (36.8  C)   Resp 20   Ht '5\' 9"'  (1.753 m)   Wt 70.8 kg (156 lb)   LMP 01/08/2017 (Exact Date)   SpO2 100%   BMI 23.04 kg/m   Physical Exam  Constitutional: She is oriented to person, place, and time. Vital signs are normal. She appears well-developed and well-nourished.  HENT:  Head: Normocephalic and atraumatic.  Right Ear: Hearing normal.  Left Ear: Hearing normal.  Eyes: Conjunctivae and EOM are normal. Pupils are equal, round, and reactive to light.  Neck: Normal range of motion. Neck supple.  Cardiovascular: Normal rate, regular rhythm, normal heart sounds and intact distal pulses.   Pulmonary/Chest: Effort normal and breath sounds normal.  Genitourinary:  Genitourinary Comments: Chaperone present. Left gluteal cleft abscess noted around 2cm diameter. Fluctuant. TTP. No erythema. No rectum involvement.   Musculoskeletal: Normal range of motion.  Neurological: She is alert and oriented to person, place, and time.  Skin: Skin is warm and dry.  Psychiatric: She has a normal mood  and affect. Her speech is normal and behavior is normal. Thought content normal.  Nursing note and vitals reviewed.    ED Treatments / Results  Labs (all labs ordered are listed, but only abnormal results are displayed) Labs Reviewed - No data to display  EKG  EKG Interpretation None       Radiology No results found.  Procedures .Marland KitchenIncision and Drainage Date/Time: 01/18/2017 12:46 PM Performed by: Shary Decamp Authorized by: Shary Decamp   Consent:    Consent obtained:  Verbal   Consent given by:  Patient   Risks discussed:  Bleeding and incomplete drainage Location:    Type:  Abscess   Size:  2   Location:  Anogenital   Anogenital location:  Gluteal cleft Pre-procedure details:    Skin preparation:  Betadine Anesthesia (see MAR for exact dosages):    Anesthesia method:  Local infiltration   Local anesthetic:  Lidocaine 1% w/o epi Procedure type:    Complexity:  Simple Procedure details:    Incision types:  Stab incision and single straight   Incision depth:  Dermal   Scalpel blade:  11   Wound management:  Probed and deloculated and irrigated with saline   Drainage:  Purulent   Drainage amount:  Copious   Wound treatment:  Wound left open   Packing materials:  None Post-procedure details:    Patient tolerance of procedure:  Tolerated well, no immediate complications   (including critical care time)  Medications Ordered in ED Medications - No data to display   Initial Impression / Assessment and Plan / ED Course  I have reviewed the triage vital signs and the nursing notes.  Pertinent labs & imaging results that were available during my care of the patient were reviewed by me and considered in my medical decision making (see chart for details).  Final Clinical Impressions(s) / ED Diagnoses     {I have reviewed the relevant previous healthcare records.  {I obtained HPI from historian.   ED Course:  Assessment: Anne Lewis is a 51 y.o. female who  presents to ED for abscess requiring incision and drainage. I&D performed per procedure note above. Patient tolerated the procedure well.  No evidence of surrounding erythema to suggest cellulitis. Patient was prescribed Bactrim. Wound care instructions discussed. Return to ER if concern for spread of infection, increasing pain, fevers or other concerns. All questions answered.  Disposition/Plan:  DC Home Additional Verbal discharge instructions given and discussed with patient.  Pt Instructed to f/u with PCP in the next week for evaluation and treatment of symptoms. Return precautions given Pt acknowledges and agrees with plan  Supervising Physician Sherwood Gambler, MD  Final diagnoses:  Abscess, gluteal, left    New Prescriptions New Prescriptions   No medications on file     Shary Decamp, Hershal Coria 01/18/17 1303    Sherwood Gambler, MD 01/24/17 2348

## 2017-01-18 NOTE — ED Notes (Signed)
I&D done per PA, tol well, 4x4 dsrg applied to wound on left buttocks

## 2017-08-13 ENCOUNTER — Ambulatory Visit: Payer: BLUE CROSS/BLUE SHIELD | Admitting: Gynecology

## 2017-08-13 ENCOUNTER — Encounter: Payer: Self-pay | Admitting: Gynecology

## 2017-08-13 VITALS — BP 120/76 | Ht 69.0 in | Wt 167.0 lb

## 2017-08-13 DIAGNOSIS — N926 Irregular menstruation, unspecified: Secondary | ICD-10-CM

## 2017-08-13 DIAGNOSIS — Z01419 Encounter for gynecological examination (general) (routine) without abnormal findings: Secondary | ICD-10-CM

## 2017-08-13 NOTE — Patient Instructions (Addendum)
Schedule your mammography.  Check with your colonoscopy doctor to see when they want you to have another colonoscopy.    Schedule an appointment with a primary physician for a general health checkup and blood work.

## 2017-08-13 NOTE — Progress Notes (Signed)
    Anne Lewis October 29, 1965 [id number]        52 y.o.  G3P3 for annual gynecologic exam.  Doing well from a gynecologic standpoint.  Past medical history,surgical history, problem list, medications, allergies, family history and social history were all reviewed and documented as reviewed in the EPIC chart.  ROS:  Performed with pertinent positives and negatives included in the history, assessment and plan.   Additional significant findings : None   Exam: Caryn Bee assistant Vitals:   08/13/17 1405  BP: 120/76  Weight: 167 lb (75.8 kg)  Height: 5\' 9"  (1.753 m)   Body mass index is 24.66 kg/m.  General appearance:  Normal affect, orientation and appearance. Skin: Grossly normal HEENT: Without gross lesions.  No cervical or supraclavicular adenopathy. Thyroid normal.  Lungs:  Clear without wheezing, rales or rhonchi Cardiac: RR, without RMG Abdominal:  Soft, nontender, without masses, guarding, rebound, organomegaly or hernia Breasts:  Examined lying and sitting without masses, retractions, discharge or axillary adenopathy. Pelvic:  Ext, BUS, Vagina: Normal  Cervix: Normal  Uterus: Retroverted, normal size, shape and contour, midline and mobile nontender   Adnexa: Without masses or tenderness    Anus and perineum: Normal   Rectovaginal: Normal sphincter tone without palpated masses or tenderness.    Assessment/Plan:  52 y.o. G3P3 female for annual gynecologic exam with mild irregular menses, vasectomy birth control.   1. Mild irregular menses.  Patient's menses will start a little earlier or later each month but they still occur monthly.  No prolonged or or skips.  [place] last year normal.  I reviewed the perimenopause with the patient and she will keep a menstrual calendar and as long as less frequent then will monitor.  If prolonged or atypical bleeding will follow-up for evaluation.  No significant hot flushes or sweats. 2. Strong family history of breast cancer and  possible ovarian as well as GI.  Underwent genetic testing 2015 which was negative.  Last mammogram 2012.  I again strongly recommended she schedule a screening mammogram.  Increased risk despite negative direct genetic linkage reviewed.  Most common cancer in women discussed.  Patient promises to schedule mammogram.  Breast exam normal today. 3. Colonoscopy 2011.  Repeat at their recommended interval.  Possible need to do more frequently due to family history reviewed and the need to call her gastroenterologist and discuss. 4. Pap smear/HPV 10/2013.  No Pap smear done today.  No history of significant abnormal Pap smears.  Plan repeat Pap smear next year at 5-year interval per current screening guidelines. 5. Health maintenance.  Patient notes diffuse muscle and joint discomfort that comes and goes.  Also had questions about mold exposure.  I recommended she make an appointment to see a primary physician for evaluation and routine/diagnostic blood work.  No blood work done today.   Anastasio Auerbach MD, 2:31 PM 08/13/2017

## 2017-08-19 ENCOUNTER — Encounter: Payer: Self-pay | Admitting: Family Medicine

## 2017-08-19 ENCOUNTER — Ambulatory Visit (INDEPENDENT_AMBULATORY_CARE_PROVIDER_SITE_OTHER): Payer: Self-pay

## 2017-08-19 ENCOUNTER — Ambulatory Visit: Payer: BLUE CROSS/BLUE SHIELD | Admitting: Family Medicine

## 2017-08-19 VITALS — BP 122/78 | HR 92 | Ht 69.0 in | Wt 165.5 lb

## 2017-08-19 DIAGNOSIS — R05 Cough: Secondary | ICD-10-CM

## 2017-08-19 DIAGNOSIS — Z0000 Encounter for general adult medical examination without abnormal findings: Secondary | ICD-10-CM

## 2017-08-19 DIAGNOSIS — J0100 Acute maxillary sinusitis, unspecified: Secondary | ICD-10-CM

## 2017-08-19 DIAGNOSIS — R059 Cough, unspecified: Secondary | ICD-10-CM

## 2017-08-19 DIAGNOSIS — J019 Acute sinusitis, unspecified: Secondary | ICD-10-CM | POA: Insufficient documentation

## 2017-08-19 DIAGNOSIS — M7712 Lateral epicondylitis, left elbow: Secondary | ICD-10-CM

## 2017-08-19 MED ORDER — SALINE SPRAY 0.65 % NA SOLN: 2.0000 | NASAL | 12 refills | Status: DC | PRN

## 2017-08-19 MED ORDER — AMOXICILLIN 500 MG PO CAPS: 500.0000 mg | ORAL_CAPSULE | Freq: Three times a day (TID) | ORAL | 0 refills | Status: DC

## 2017-08-19 NOTE — Patient Instructions (Addendum)
How to Increase Your Level of Physical Activity Getting regular physical activity is important for your overall health and well-being. Most people do not get enough exercise. There are easy ways to increase your level of physical activity, even if you have not been very active in the past or you are just starting out. Why is physical activity important? Physical activity has many short-term and long-term health benefits. Regular exercise can:  Help you lose weight or maintain a healthy weight.  Strengthen your muscles and bones.  Boost your mood and improve self-esteem.  Reduce your risk of certain long-term (chronic) diseases, like heart disease, cancer, and diabetes.  Help you stay capable of walking and moving around (mobile) as you age.  Prevent accidents, such as falls, as you age.  Increase life expectancy.  What are the benefits of being physically active on a regular basis? In addition to improving your physical health, being physically active on most days of the week can help you in ways that you may not expect. Benefits of regular physical activity may include:  Feeling good about your body.  Being able to move around more easily and for longer periods of time without getting tired (increased stamina).  Finding new sources of fun and enjoyment.  Meeting new people who share a common interest.  Being able to fight off illness better (enhanced immunity).  Being able to sleep better.  What can happen if I am not physically active on a regular basis? Not getting enough physical activity can lead to an unhealthy lifestyle and future health problems. This can increase your chances of:  Becoming overweight or obese.  Becoming sick.  Developing chronic illnesses, like heart disease or diabetes.  Having mental health problems, like depression or anxiety.  Having sleep problems.  Having trouble walking or getting yourself around (reduced mobility).  Injuring yourself  in a fall as you get older.  What steps can I take to be more physically active?  Check with your health care provider about how to get started. Ask your health care provider what activities are safe for you.  Start out slowly. Walking or doing some simple chair exercises is a good place to start, especially if you have not been active before or for a long time.  Try to find activities that you enjoy. You are more likely to commit to an exercise routine if it does not feel like a chore.  If you have bone or joint problems, choose low-impact exercises, like walking or swimming.  Include physical activity in your everyday routine.  Invite friends or family members to exercise with you. This also will help you commit to your workout plan.  Set goals that you can work toward.  Aim for at least 150 minutes of moderate-intensity exercise each week. Examples of moderate-intensity exercise include walking or riding a bike. Where to find more information:  Centers for Disease Control and Prevention: BowlingGrip.is  President's Council on [employer], Sports & Nutrition www.http://villegas.org/  ChooseMyPlate: [employer]ages.dk Contact a health care provider if:  You have headaches, muscle aches, or joint pain.  You feel dizzy or light-headed while exercising.  You faint.  You have chest pain while exercising. Summary  Exercise benefits your mind and body at any age, even if you are just starting out.  If you have a chronic illness or have not been active for a while, check with your health care provider before increasing your physical activity.  Choose activities that are safe and enjoyable  for you.Ask your health care provider what activities are safe for you.  Start slowly. Tell your health care provider if you have problems as you start to increase your activity level. This information is not intended to replace advice given to  you by your health care provider. Make sure you discuss any questions you have with your health care provider. Document Released: 06/27/2016 Document Revised: 06/27/2016 Document Reviewed: 06/27/2016 Elsevier Interactive Patient Education  2018 [employer].  Sinusitis, Adult Sinusitis is soreness and inflammation of your sinuses. Sinuses are hollow spaces in the bones around your face. They are located:  Around your eyes.  In the middle of your forehead.  Behind your nose.  In your cheekbones.  Your sinuses and nasal passages are lined with a stringy fluid (mucus). Mucus normally drains out of your sinuses. When your nasal tissues get inflamed or swollen, the mucus can get trapped or blocked so air cannot flow through your sinuses. This lets bacteria, viruses, and funguses grow, and that leads to infection. Follow these instructions at home: Medicines  Take, use, or apply over-the-counter and prescription medicines only as told by your doctor. These may include nasal sprays.  If you were prescribed an antibiotic medicine, take it as told by your doctor. Do not stop taking the antibiotic even if you start to feel better. Hydrate and Humidify  Drink enough water to keep your pee (urine) clear or pale yellow.  Use a cool mist humidifier to keep the humidity level in your home above 50%.  Breathe in steam for 10-15 minutes, 3-4 times a day or as told by your doctor. You can do this in the bathroom while a hot shower is running.  Try not to spend time in cool or dry air. Rest  Rest as much as possible.  Sleep with your head raised (elevated).  Make sure to get enough sleep each night. General instructions  Put a warm, moist washcloth on your face 3-4 times a day or as told by your doctor. This will help with discomfort.  Wash your hands often with soap and water. If there is no soap and water, use hand sanitizer.  Do not smoke. Avoid being around people who are smoking  (secondhand smoke).  Keep all follow-up visits as told by your doctor. This is important. Contact a doctor if:  You have a fever.  Your symptoms get worse.  Your symptoms do not get better within 10 days. Get help right away if:  You have a very bad headache.  You cannot stop throwing up (vomiting).  You have pain or swelling around your face or eyes.  You have trouble seeing.  You feel confused.  Your neck is stiff.  You have trouble breathing. This information is not intended to replace advice given to you by your health care provider. Make sure you discuss any questions you have with your health care provider. Document Released: 12/25/2007 Document Revised: 03/03/2016 Document Reviewed: 05/03/2015 Elsevier Interactive Patient Education  2018 [employer].  Smoking Tobacco Information Smoking tobacco will very likely harm your health. Tobacco contains a poisonous (toxic), colorless chemical called nicotine. Nicotine affects the brain and makes tobacco addictive. This change in your brain can make it hard to stop smoking. Tobacco also has other toxic chemicals that can hurt your body and raise your risk of many cancers. How can smoking tobacco affect me? Smoking tobacco can increase your chances of having serious health conditions, such as:  Cancer. Smoking is most  commonly associated with lung cancer, but can lead to cancer in other parts of the body.  Chronic obstructive pulmonary disease (COPD). This is a long-term lung condition that makes it hard to breathe. It also gets worse over time.  High blood pressure (hypertension), heart disease, stroke, or heart attack.  Lung infections, such as pneumonia.  Cataracts. This is when the lenses in the eyes become clouded.  Digestive problems. This may include peptic ulcers, heartburn, and gastroesophageal reflux disease (GERD).  Oral health problems, such as gum disease and tooth loss.  Loss of taste and smell.  Smoking  can affect your appearance by causing:  Wrinkles.  Yellow or stained teeth, fingers, and fingernails.  Smoking tobacco can also affect your social life.  Many workplaces, [employer], hotels, and public places are tobacco-free. This means that you may experience challenges in finding places to smoke when away from home.  The cost of a smoking habit can be expensive. Expenses for someone who smokes come in two ways: ? You spend money on a regular basis to buy tobacco. ? Your health care costs in the long-term are higher if you smoke.  Tobacco smoke can also affect the health of those around you. Children of smokers have greater chances of: ? Sudden infant death syndrome (SIDS). ? Ear infections. ? Lung infections.  What lifestyle changes can be made?  Do not start smoking. Quit if you already do.  To quit smoking: ? Make a plan to quit smoking and commit yourself to it. Look for programs to help you and ask your health care provider for recommendations and ideas. ? Talk with your health care provider about using nicotine replacement medicines to help you quit. Medicine replacement medicines include gum, lozenges, patches, sprays, or pills. ? Do not replace cigarette smoking with electronic cigarettes, which are commonly called e-cigarettes. The safety of e-cigarettes is not known, and some may contain harmful chemicals. ? Avoid places, people, or situations that tempt you to smoke. ? If you try to quit but return to smoking, don't give up hope. It is very common for people to try a number of times before they fully succeed. When you feel ready again, give it another try.  Quitting smoking might affect the way you eat as well as your weight. Be prepared to monitor your eating habits. Get support in planning and following a healthy diet.  Ask your health care provider about having regular tests (screenings) to check for cancer. This may include blood tests, imaging tests, and other  tests.  Exercise regularly. Consider taking walks, joining a gym, or doing yoga or exercise classes.  Develop skills to manage your stress. These skills include meditation. What are the benefits of quitting smoking? By quitting smoking, you may:  Lower your risk of getting cancer and other diseases caused by smoking.  Live longer.  Breathe better.  Lower your blood pressure and heart rate.  Stop your addiction to tobacco.  Stop creating secondhand smoke that hurts other people.  Improve your sense of taste and smell.  Look better over time, due to having fewer wrinkles and less staining.  What can happen if changes are not made? If you do not stop smoking, you may:  Get cancer and other diseases.  Develop COPD or other long-term (chronic) lung conditions.  Develop serious problems with your heart and blood vessels (cardiovascular system).  Need more tests to screen for problems caused by smoking.  Have higher, long-term healthcare costs from medicines  or treatments related to smoking.  Continue to have worsening changes in your lungs, mouth, and nose.  Where to find support: To get support to quit smoking, consider:  Asking your health care provider for more information and resources.  Taking classes to learn more about quitting smoking.  Looking for local organizations that offer resources about quitting smoking.  Joining a support group for people who want to quit smoking in your local community.  Where to find more information: You may find more information about quitting smoking from:  HelpGuide.org: www.helpguide.org/articles/addictions/how-to-quit-smoking.htm  https://hall.com/: smokefree.gov  American Lung Association: www.lung.org  Contact a health care provider if:  You have problems breathing.  Your lips, nose, or fingers turn blue.  You have chest pain.  You are coughing up blood.  You feel faint or you pass out.  You have other noticeable  changes that cause you to worry. Summary  Smoking tobacco can negatively affect your health, the health of those around you, your finances, and your social life.  Do not start smoking. Quit if you already do. If you need help quitting, ask your health care provider.  Think about joining a support group for people who want to quit smoking in your local community. There are many effective programs that will help you to quit this behavior. This information is not intended to replace advice given to you by your health care provider. Make sure you discuss any questions you have with your health care provider. Document Released: 07/23/2016 Document Revised: 07/23/2016 Document Reviewed: 07/23/2016 Elsevier Interactive Patient Education  2018 [place]  Tennis Elbow Tennis elbow is puffiness (inflammation) of the outer tendons of your forearm close to your elbow. Your tendons attach your muscles to your bones. Tennis elbow can happen in any sport or job in which you use your elbow too much. It is caused by doing the same motion over and over. Tennis elbow can cause:  Pain and tenderness in your forearm and the outer part of your elbow.  A burning feeling. This runs from your elbow through your arm.  Weak grip in your hands.  Follow these instructions at home: Activity  Rest your elbow and wrist as told by your doctor. Try to avoid any activities that caused the problem until your doctor says that you can do them again.  If a physical therapist teaches you exercises, do all of them as told.  If you lift an object, lift it with your palm facing up. This is easier on your elbow. Lifestyle  If your tennis elbow is caused by sports, check your equipment and make sure that: ? You are using it correctly. ? It fits you well.  If your tennis elbow is caused by work, take breaks often, if you are able. Talk with your manager about doing your work in a way that is safe for you. ? If your  tennis elbow is caused by computer use, talk with your manager about any changes that can be made to your work setup. General instructions  If told, apply ice to the painful area: ? Put ice in a plastic bag. ? Place a towel between your skin and the bag. ? Leave the ice on for 20 minutes, 2-3 times per day.  Take medicines only as told by your doctor.  If you were given a brace, wear it as told by your doctor.  Keep all follow-up visits as told by your doctor. This is important. Contact a doctor if:  Your  pain does not get better with treatment.  Your pain gets worse.  You have weakness in your forearm, hand, or fingers.  You cannot feel your forearm, hand, or fingers. This information is not intended to replace advice given to you by your health care provider. Make sure you discuss any questions you have with your health care provider. Document Released: 12/26/2009 Document Revised: 03/07/2016 Document Reviewed: 07/04/2014 Elsevier Interactive Patient Education  [employer].

## 2017-08-19 NOTE — Progress Notes (Signed)
Subjective:  Patient ID: Anne Lewis, female    DOB: [birth year]  Age: 52 y.o. MRN: [id number]  CC: Establish Care   HPI Anne Lewis presents for establishment of care.  She presents with a 1-2-week history of sinus pain associated with green and brown rhinorrhea.  More recently there is been teeth pain and it actually hurts to eat.  She is having postnasal mode postnasal drip with cough.  There is mold on the ceiling over of her bathroom and she has been asking her landlord to deal with that.  She denies wheezing.  She smokes on occasion when she goes out and that may be the 1 or 2 cigarettes.  She is right-handed but is been experiencing left elbow pain.  She denies stiffness in the joint, injury or excessive use of that elbow.  She drinks alcohol on occasion.  She does not use illicit drugs.  She lives with her son and daughter.  She works in [employer].  Recent GYN check was last month.  Her daughter and mother both suffer from lupus.  Patient denies rashes or generalized aches and pains.  She has not been losing weight.  Colonoscopy was 2 years ago.  She is due for mammogram and will schedule it herself.  She is nonfasting today and will return fasting for blood work.  History Anne Lewis has a past medical history of BRCA negative (11/2013), H pylori ulcer, and Ulcer of gastric fundus.   She has a past surgical history that includes Cesarean section (3016,0109); Tonsillectomy (1987); and Endometrial cryoablation (12.19.2012).   Her family history includes Breast cancer (age of onset: 40) in her maternal aunt; Cancer (age of onset: 49) in her brother; Cancer (age of onset: 4) in her cousin; Cancer (age of onset: 53) in her sister; Cancer (age of onset: 54) in her cousin; Cancer (age of onset: 35) in her mother; Cancer (age of onset: 12) in her paternal aunt; Cancer (age of onset: 26) in her maternal grandmother; Cancer (age of onset: 37) in her maternal grandfather; Diabetes  in her father; Diverticulitis in her mother; Heart disease in her father; Hypertension in her father and mother.She reports that she has been smoking.  she has never used smokeless tobacco. She reports that she drinks alcohol. She reports that she does not use drugs.  No outpatient medications prior to visit.   No facility-administered medications prior to visit.     ROS Review of Systems  Constitutional: Negative for chills, fatigue, fever and unexpected weight change.  HENT: Positive for congestion, dental problem, postnasal drip, rhinorrhea, sinus pressure and sinus pain. Negative for ear pain, sore throat and trouble swallowing.   Eyes: Negative for photophobia and visual disturbance.  Respiratory: Positive for cough. Negative for apnea and shortness of breath.   Cardiovascular: Negative.   Gastrointestinal: Negative for abdominal pain, anal bleeding and blood in stool.  Endocrine: Negative for cold intolerance and heat intolerance.  Genitourinary: Negative for difficulty urinating and hematuria.  Musculoskeletal: Positive for arthralgias. Negative for gait problem, joint swelling and myalgias.  Skin: Negative for color change and rash.  Allergic/Immunologic: Negative for immunocompromised state.  Neurological: Positive for headaches. Negative for speech difficulty, light-headedness and numbness.  Hematological: Does not bruise/bleed easily.  Psychiatric/Behavioral: Negative.     Objective:  BP 122/78 (BP Location: Right Arm, Patient Position: Sitting, Cuff Size: Normal)   Pulse 92   Ht '5\' 9"'  (1.753 m)   Wt 165 lb 8 oz (75.1 kg)  LMP 07/29/2017   SpO2 99%   BMI 24.44 kg/m   Physical Exam  Constitutional: She is oriented to person, place, and time. She appears well-developed and well-nourished. No distress.  HENT:  Head: Normocephalic and atraumatic.  Right Ear: Tympanic membrane, external ear and ear canal normal.  Left Ear: Tympanic membrane, external ear and ear canal  normal.  Nose: Right sinus exhibits maxillary sinus tenderness. Left sinus exhibits maxillary sinus tenderness.    Mouth/Throat: Oropharynx is clear and moist. No oropharyngeal exudate.  Eyes: Conjunctivae are normal. Pupils are equal, round, and reactive to light. Right eye exhibits no discharge. Left eye exhibits no discharge. No scleral icterus.  Neck: Neck supple. No JVD present. No tracheal deviation present. No thyromegaly present.  Cardiovascular: Normal rate, regular rhythm and normal heart sounds.  Pulmonary/Chest: Effort normal and breath sounds normal. No stridor. No respiratory distress. She has no wheezes. She has no rales.  Abdominal: Bowel sounds are normal.  Musculoskeletal:       Left elbow: She exhibits normal range of motion, no swelling and no effusion. Tenderness found. Lateral epicondyle tenderness noted.       Arms: Lymphadenopathy:    She has no cervical adenopathy.  Neurological: She is alert and oriented to person, place, and time.  Skin: Skin is warm and dry. She is not diaphoretic.  Psychiatric: She has a normal mood and affect. Her behavior is normal.      Assessment & Plan:   Anne Lewis was seen today for establish care.  Diagnoses and all orders for this visit:  Acute non-recurrent maxillary sinusitis -     CBC; Future -     amoxicillin (AMOXIL) 500 MG capsule; Take 1 capsule (500 mg total) by mouth 3 (three) times daily. -     sodium chloride (OCEAN) 0.65 % SOLN nasal spray; Place 2 sprays into both nostrils as needed for congestion.  Lateral epicondylitis of left elbow  Cough -     DG Chest 2 View; Future -     CBC; Future  Healthcare maintenance -     CBC; Future -     Comprehensive metabolic panel; Future -     Lipid panel; Future -     TSH; Future -     Urinalysis, Routine w reflex microscopic; Future -     HIV antibody; Future   I am having Anne Lewis start on amoxicillin and sodium chloride.  Meds ordered this encounter    Medications  . amoxicillin (AMOXIL) 500 MG capsule    Sig: Take 1 capsule (500 mg total) by mouth 3 (three) times daily.    Dispense:  30 capsule    Refill:  0  . sodium chloride (OCEAN) 0.65 % SOLN nasal spray    Sig: Place 2 sprays into both nostrils as needed for congestion.    Dispense:  1 Bottle    Refill:  12   Patient will return fasting for her blood work.  I have given her information about tennis elbow and encouraged her to purchase a strap.  Follow-up: Return in about 1 month (around 09/18/2017).  Libby Maw, MD

## 2017-09-06 ENCOUNTER — Other Ambulatory Visit: Payer: Self-pay

## 2017-09-06 ENCOUNTER — Encounter (HOSPITAL_BASED_OUTPATIENT_CLINIC_OR_DEPARTMENT_OTHER): Payer: Self-pay | Admitting: Emergency Medicine

## 2017-09-06 ENCOUNTER — Emergency Department (HOSPITAL_BASED_OUTPATIENT_CLINIC_OR_DEPARTMENT_OTHER)
Admission: EM | Admit: 2017-09-06 | Discharge: 2017-09-06 | Disposition: A | Discharge status: Home / Self Care | Payer: BLUE CROSS/BLUE SHIELD | Attending: Emergency Medicine | Admitting: Emergency Medicine

## 2017-09-06 DIAGNOSIS — K0889 Other specified disorders of teeth and supporting structures: Secondary | ICD-10-CM

## 2017-09-06 DIAGNOSIS — R6884 Jaw pain: Secondary | ICD-10-CM | POA: Diagnosis present

## 2017-09-06 DIAGNOSIS — F17210 Nicotine dependence, cigarettes, uncomplicated: Secondary | ICD-10-CM | POA: Diagnosis not present

## 2017-09-06 MED ORDER — CLINDAMYCIN HCL 300 MG PO CAPS: 300.0000 mg | ORAL_CAPSULE | Freq: Three times a day (TID) | ORAL | 0 refills | Status: DC

## 2017-09-06 MED ORDER — CLINDAMYCIN HCL 150 MG PO CAPS
300.0000 mg | ORAL_CAPSULE | Freq: Once | ORAL | Status: AC
  Administered 2017-09-06: 300 mg via ORAL
  Filled 2017-09-06: qty 2

## 2017-09-06 MED ORDER — TRAMADOL HCL 50 MG PO TABS
50.0000 mg | ORAL_TABLET | Freq: Once | ORAL | Status: AC
  Administered 2017-09-06: 50 mg via ORAL
  Filled 2017-09-06: qty 1

## 2017-09-06 MED ORDER — TRAMADOL HCL 50 MG PO TABS: 50.0000 mg | ORAL_TABLET | Freq: Three times a day (TID) | ORAL | 0 refills | Status: DC | PRN

## 2017-09-06 MED ORDER — BUPIVACAINE-EPINEPHRINE (PF) 0.5% -1:200000 IJ SOLN
INTRAMUSCULAR | Status: AC
  Administered 2017-09-06
  Filled 2017-09-06: qty 1.8

## 2017-09-06 NOTE — Discharge Instructions (Signed)
Anne Lewis,  Please seek out a dentist as soon as possible.Your molar may need to be removed.  Take tramadol as needed for pain. You can also take ibuprofen 600 mg every 8 hours for pain.  Take clindamycin 300 mg every 8 hours for the next week in case of abscess.  Return if you have difficulty swallowing or increasing severity of pain.

## 2017-09-06 NOTE — ED Notes (Signed)
Alert, NAD, calm, interactive, resps e/u, speaking in clear complete sentences, no dyspnea noted, skin W&D, initial VSS, finished amox TID for 10d, c/o R lower jaw pain and swelling, describes as dental and sinus issues/pain/swelling, fever resolved with aleve, (denies: sob, cough, ear sx, NVD, dizziness or visual changes). Family at [facility].

## 2017-09-06 NOTE — ED Notes (Signed)
EDP at BS 

## 2017-09-06 NOTE — ED Notes (Signed)
In to d/c pt. Home and she is requesting the dental block that was offered to her by the MD earlier. Dr. Maryan Rued aware and orders received.

## 2017-09-06 NOTE — ED Triage Notes (Signed)
R side facial swelling and pain since yesterday. Recently tx for sinus infection. Also reports fever today. Took aleve PTA

## 2017-09-06 NOTE — ED Notes (Signed)
Dr. Maryan Rued at bedside to place dental block.

## 2017-09-07 NOTE — ED Provider Notes (Signed)
[facility] EMERGENCY DEPARTMENT Provider Note   CSN: [id number] Arrival date & time: 09/06/17  1910     History   Chief Complaint Chief Complaint  Patient presents with  . Facial Swelling  . Fever    HPI Anne Lewis is a 52 y.o. female who presents with left-sided dental pain.  HPI  Patient reports pain of right lower jaw for last 2-3 days. She says her wisdom tooth on the upper right jaw hangs down, and sometimes she bites down accidentally, which causes pain. She did this recently and is worried bottom molar is loose. She has significant pain of bottom jaw and has noticed increased swelling of right side of her face. She thinks she had a fever today. She took 3 aleve this morning and 2 aleve this afternoon with some improvement. However, pain was so bad last night it brought her to tears, and she barely slept. Patient does not have dentist in the area. She is really only able to tolerate liquids due to pain. No drooling or shortness of breath.   Of note, she recently completed course of amoxicillin for sinus infection.   Past Medical History:  Diagnosis Date  . BRCA negative 11/2013  . H pylori ulcer   . Ulcer of gastric fundus     Patient Active Problem List   Diagnosis Date Noted  . Acute non-recurrent maxillary sinusitis 08/19/2017  . Lateral epicondylitis of left elbow 08/19/2017  . Cough 08/19/2017  . Healthcare maintenance 08/19/2017  . Family history of malignant neoplasm of breast 11/08/2013  . Family history of malignant neoplasm of gastrointestinal tract 11/08/2013  . Family history of malignant neoplasm of ovary 11/08/2013  . RUQ PAIN 08/08/2010  . DYSPEPSIA&OTHER [facility] DISORDERS FUNCTION STOMACH 08/07/2010  . ABDOMINAL PAIN, EPIGASTRIC 08/07/2010  . OTHER CONSTIPATION 12/05/2009    Past Surgical History:  Procedure Laterality Date  . CESAREAN SECTION  [id number]  . ENDOMETRIAL CRYOABLATION  12.19.2012   HER OPTION - office performed  .  TONSILLECTOMY  1987    OB History    Gravida Para Term Preterm AB Living   '3 3       3   ' SAB TAB Ectopic Multiple Live Births                   Home Medications    Prior to Admission medications   Medication Sig Start Date End Date Taking? Authorizing Provider  amoxicillin (AMOXIL) 500 MG capsule Take 1 capsule (500 mg total) by mouth 3 (three) times daily. 08/19/17   Libby Maw, MD  clindamycin (CLEOCIN) 300 MG capsule Take 1 capsule (300 mg total) by mouth 3 (three) times daily. 09/06/17   Rogue Bussing, MD  sodium chloride (OCEAN) 0.65 % SOLN nasal spray Place 2 sprays into both nostrils as needed for congestion. 08/19/17   Libby Maw, MD  traMADol (ULTRAM) 50 MG tablet Take 1 tablet (50 mg total) by mouth every 8 (eight) hours as needed. 09/06/17   Rogue Bussing, MD    Family History Family History  Problem Relation Age of Onset  . Cancer Cousin 39       ovarian  . Cancer Cousin 26       breast  . Hypertension Mother   . Cancer Mother 25       intestational and ovarian  . Diverticulitis Mother   . Diabetes Father   . Hypertension Father   . Heart disease Father   .  Cancer Sister 31       ovarian and intestional  . Breast cancer Maternal Aunt 60  . Cancer Brother 24       GI cancer  . Cancer Paternal Aunt 54       stomach  . Cancer Maternal Grandmother 79       ovarian and pancreatic  . Cancer Maternal Grandfather 82       pancreatic    Social History Social History   Tobacco Use  . Smoking status: Current Some Day Smoker  . Smokeless tobacco: Never Used  Substance Use Topics  . Alcohol use: Yes    Comment: Social  . Drug use: No     Allergies   Aspirin   Review of Systems Review of Systems  Constitutional: Positive for fever.  HENT: Negative for congestion, sinus pain and trouble swallowing.   Eyes: Negative for pain.  Respiratory: Negative for cough and shortness of breath.   Cardiovascular:  Negative for chest pain.  Gastrointestinal: Negative for abdominal pain and nausea.  Musculoskeletal: Positive for neck pain. Negative for back pain.  Skin: Negative for rash.  Neurological: Positive for headaches.     Physical Exam Updated Vital Signs BP 112/85 (BP Location: Left Arm)   Pulse 67   Temp 98.8 F (37.1 C) (Oral)   Resp 18   LMP 07/06/2017   SpO2 100%   Physical Exam  Constitutional: She is oriented to person, place, and time. She appears well-developed and well-nourished.  Mildly distressed.  HENT:  Nose: Nose normal.  Mild swelling of right jaw compared to left. Right bottom molar loose with significant TTP over outer gumline. No pain over upper gumline; upper molar not loose but protrudes down farther than opposite tooth of upper jaw.   Eyes: EOM are normal. Pupils are equal, round, and reactive to light.  Neck: Normal range of motion.  Submandibular swelling on right compared to left and tender to palpation.   Cardiovascular: Normal rate, regular rhythm and normal heart sounds.  Pulmonary/Chest: Effort normal. No respiratory distress.  Abdominal: Soft. Bowel sounds are normal.  Musculoskeletal: Normal range of motion.  Lymphadenopathy:    She has cervical adenopathy.  Neurological: She is alert and oriented to person, place, and time. No sensory deficit.  Skin: Skin is warm and dry.  Psychiatric: She has a normal mood and affect.  Nursing note and vitals reviewed.    ED Treatments / Results  Labs (all labs ordered are listed, but only abnormal results are displayed) Labs Reviewed - No data to display  EKG  EKG Interpretation None       Radiology No results found.  Procedures .Nerve Block Date/Time: 09/07/2017 12:22 AM Performed by: Blanchie Dessert, MD Authorized by: Blanchie Dessert, MD   Consent:    Consent obtained:  Verbal   Consent given by:  Patient   Risks discussed:  Allergic reaction, infection and bleeding   Alternatives  discussed:  No treatment and alternative treatment Indications:    Indications:  Pain relief Location:    Nerve block body site: right lower gumline. Procedure details (see MAR for exact dosages):    Anesthetic injected:  Bupivacaine 0.25% WITH epi Post-procedure details:    Outcome:  Pain relieved   Patient tolerance of procedure:  Tolerated well, no immediate complications    (including critical care time)  Medications Ordered in ED Medications  clindamycin (CLEOCIN) capsule 300 mg (300 mg Oral Given 09/06/17 2319)  traMADol (ULTRAM) tablet 50 mg (  50 mg Oral Given 09/06/17 2319)  bupivacaine-epinephrine (MARCAINE W/ EPI) 0.5% -1:200000 injection (  Given by Other 09/06/17 2341)     Initial Impression / Assessment and Plan / ED Course  I have reviewed the triage vital signs and the nursing notes.  Pertinent labs & imaging results that were available during my care of the patient were reviewed by me and considered in my medical decision making (see chart for details).  Patient with painful, loose right lower molar. May have underlying abscess given lymphadenopathy and degree of pain. Dose of tramadol (has not tolerated other agents well) and clindamycin given.   Patient initially declined inferior alveolar nerve block but changed her mind upon discharge; bupivacaine injection administered as documented above with immediate relief.   Final Clinical Impressions(s) / ED Diagnoses   Final diagnoses:  Pain, dental   Patient discharged with prescription for clindamycin (recent completion of amoxicillin) and tramadol for dental pain and possible infection/abscess given lymphadenopathy and report of fevers. Gave handout on local dentists. Advised she seek dental care as soon as possible for likely needed extraction.   ED Discharge Orders        Ordered    clindamycin (CLEOCIN) 300 MG capsule  3 times daily,   Status:  Discontinued     09/06/17 2325    traMADol (ULTRAM) 50 MG tablet   Every 8 hours PRN     09/06/17 2325    clindamycin (CLEOCIN) 300 MG capsule  3 times daily     09/06/17 2326       Rogue Bussing, MD 09/07/17 5038    Blanchie Dessert, MD 09/08/17 0000

## 2017-09-18 ENCOUNTER — Ambulatory Visit: Payer: Self-pay | Admitting: Family Medicine

## 2018-01-03 ENCOUNTER — Other Ambulatory Visit: Payer: Self-pay

## 2018-01-03 ENCOUNTER — Emergency Department (HOSPITAL_BASED_OUTPATIENT_CLINIC_OR_DEPARTMENT_OTHER)
Admission: EM | Admit: 2018-01-03 | Discharge: 2018-01-03 | Disposition: A | Discharge status: Home / Self Care | Payer: BLUE CROSS/BLUE SHIELD | Attending: Emergency Medicine | Admitting: Emergency Medicine

## 2018-01-03 ENCOUNTER — Encounter (HOSPITAL_BASED_OUTPATIENT_CLINIC_OR_DEPARTMENT_OTHER): Payer: Self-pay | Admitting: Emergency Medicine

## 2018-01-03 DIAGNOSIS — Y92007 Garden or yard of unspecified non-institutional (private) residence as the place of occurrence of the external cause: Secondary | ICD-10-CM | POA: Insufficient documentation

## 2018-01-03 DIAGNOSIS — F17200 Nicotine dependence, unspecified, uncomplicated: Secondary | ICD-10-CM | POA: Diagnosis not present

## 2018-01-03 DIAGNOSIS — Y939 Activity, unspecified: Secondary | ICD-10-CM | POA: Diagnosis not present

## 2018-01-03 DIAGNOSIS — W5501XA Bitten by cat, initial encounter: Secondary | ICD-10-CM

## 2018-01-03 DIAGNOSIS — Z2914 Encounter for prophylactic rabies immune globin: Secondary | ICD-10-CM | POA: Diagnosis not present

## 2018-01-03 DIAGNOSIS — S51812A Laceration without foreign body of left forearm, initial encounter: Secondary | ICD-10-CM | POA: Insufficient documentation

## 2018-01-03 DIAGNOSIS — Y998 Other external cause status: Secondary | ICD-10-CM | POA: Diagnosis not present

## 2018-01-03 DIAGNOSIS — Z23 Encounter for immunization: Secondary | ICD-10-CM | POA: Diagnosis not present

## 2018-01-03 DIAGNOSIS — S51811A Laceration without foreign body of right forearm, initial encounter: Secondary | ICD-10-CM | POA: Diagnosis not present

## 2018-01-03 DIAGNOSIS — W5503XA Scratched by cat, initial encounter: Secondary | ICD-10-CM | POA: Insufficient documentation

## 2018-01-03 MED ORDER — TETANUS-DIPHTH-ACELL PERTUSSIS 5-2.5-18.5 LF-MCG/0.5 IM SUSP
0.5000 mL | Freq: Once | INTRAMUSCULAR | Status: AC
  Administered 2018-01-03: 0.5 mL via INTRAMUSCULAR
  Filled 2018-01-03: qty 0.5

## 2018-01-03 MED ORDER — DOXYCYCLINE HYCLATE 100 MG PO TABS
100.0000 mg | ORAL_TABLET | Freq: Once | ORAL | Status: AC
  Administered 2018-01-03: 100 mg via ORAL
  Filled 2018-01-03: qty 1

## 2018-01-03 MED ORDER — DOXYCYCLINE HYCLATE 100 MG PO CAPS: 100.0000 mg | ORAL_CAPSULE | Freq: Two times a day (BID) | ORAL | 0 refills | Status: DC

## 2018-01-03 MED ORDER — RABIES IMMUNE GLOBULIN 150 UNIT/ML IM INJ
20.0000 [IU]/kg | INJECTION | Freq: Once | INTRAMUSCULAR | Status: AC
  Administered 2018-01-03: 1575 [IU] via INTRAMUSCULAR
  Filled 2018-01-03: qty 12

## 2018-01-03 MED ORDER — CLINDAMYCIN HCL 300 MG PO CAPS: 300.0000 mg | ORAL_CAPSULE | Freq: Four times a day (QID) | ORAL | 0 refills | Status: AC

## 2018-01-03 MED ORDER — CLINDAMYCIN PHOSPHATE 900 MG/50ML IV SOLN
900.0000 mg | Freq: Once | INTRAVENOUS | Status: AC
  Administered 2018-01-03: 900 mg via INTRAVENOUS
  Filled 2018-01-03: qty 50

## 2018-01-03 MED ORDER — RABIES VACCINE, PCEC IM SUSR
1.0000 mL | Freq: Once | INTRAMUSCULAR | Status: AC
  Administered 2018-01-03: 1 mL via INTRAMUSCULAR
  Filled 2018-01-03: qty 1

## 2018-01-03 NOTE — Discharge Instructions (Addendum)
You will need to have your rabies immunoglobulin shot on Tuesday 6/18, Saturday 6/22 and Saturday 6/29.  Please have your scratches rechecked on Tuesday 6/18.  Please take antibiotics as prescribed.  You will need to take doxycycline twice a day and clindamycin 4 times a day.  Your tetanus shot was updated in the ER today.  Return to the emergency department immediately if you notice worsening redness, worsening swelling or have a fever.

## 2018-01-03 NOTE — ED Triage Notes (Signed)
Reports animal bite by stray cat last night.  Swelling to right hand with multiple scratch wounds to arms.

## 2018-01-03 NOTE — ED Provider Notes (Signed)
[facility] EMERGENCY DEPARTMENT Provider Note   CSN: [id number] Arrival date & time: 01/03/18  1256     History   Chief Complaint Chief Complaint  Patient presents with  . Animal Bite    HPI Anne Lewis is a 52 y.o. female.  HPI  Patient is a 52yo female with a history of prediabetes who presents to the emergency department for evaluation of cat bite.  Patient reports that she was outside her home yesterday evening around 10:30 PM when a stray cat attacked her.  The cat scratched and bit her bilateral arms.  She states that she vigorously cleaned her arms and hands with antibacterial soap and hydrogen peroxide.  Overnight she noticed increasing redness and swelling around the bites.  States that she has had worsening pain around the wounds.  She reports pain is 8/10 in severity on her bilateral hands and is worsened when she makes a fist.  She tried taking some Aleve for her pain with only minor improvement.  She denies fevers, chills, scratches or wounds elsewhere, nausea/vomiting.  She reports that she is allergic to Augmentin.  Past Medical History:  Diagnosis Date  . BRCA negative 11/2013  . H pylori ulcer   . Ulcer of gastric fundus     Patient Active Problem List   Diagnosis Date Noted  . Acute non-recurrent maxillary sinusitis 08/19/2017  . Lateral epicondylitis of left elbow 08/19/2017  . Cough 08/19/2017  . Healthcare maintenance 08/19/2017  . Family history of malignant neoplasm of breast 11/08/2013  . Family history of malignant neoplasm of gastrointestinal tract 11/08/2013  . Family history of malignant neoplasm of ovary 11/08/2013  . RUQ PAIN 08/08/2010  . DYSPEPSIA&OTHER [facility] DISORDERS FUNCTION STOMACH 08/07/2010  . ABDOMINAL PAIN, EPIGASTRIC 08/07/2010  . OTHER CONSTIPATION 12/05/2009    Past Surgical History:  Procedure Laterality Date  . CESAREAN SECTION  [id number]  . ENDOMETRIAL CRYOABLATION  12.19.2012   HER OPTION - office performed   . TONSILLECTOMY  1987     OB History    Gravida  3   Para  3   Term      Preterm      AB      Living  3     SAB      TAB      Ectopic      Multiple      Live Births               Home Medications    Prior to Admission medications   Medication Sig Start Date End Date Taking? Authorizing Provider  amoxicillin (AMOXIL) 500 MG capsule Take 1 capsule (500 mg total) by mouth 3 (three) times daily. 08/19/17   Libby Maw, MD  clindamycin (CLEOCIN) 300 MG capsule Take 1 capsule (300 mg total) by mouth 3 (three) times daily. 09/06/17   Rogue Bussing, MD  sodium chloride (OCEAN) 0.65 % SOLN nasal spray Place 2 sprays into both nostrils as needed for congestion. 08/19/17   Libby Maw, MD  traMADol (ULTRAM) 50 MG tablet Take 1 tablet (50 mg total) by mouth every 8 (eight) hours as needed. 09/06/17   Rogue Bussing, MD    Family History Family History  Problem Relation Age of Onset  . Cancer Cousin 39       ovarian  . Cancer Cousin 37       breast  . Hypertension Mother   . Cancer Mother 35  intestational and ovarian  . Diverticulitis Mother   . Diabetes Father   . Hypertension Father   . Heart disease Father   . Cancer Sister 67       ovarian and intestional  . Breast cancer Maternal Aunt 60  . Cancer Brother 24       GI cancer  . Cancer Paternal Aunt 94       stomach  . Cancer Maternal Grandmother 92       ovarian and pancreatic  . Cancer Maternal Grandfather 82       pancreatic    Social History Social History   Tobacco Use  . Smoking status: Current Some Day Smoker  . Smokeless tobacco: Never Used  Substance Use Topics  . Alcohol use: Yes    Comment: Social  . Drug use: No     Allergies   Aspirin; Augmentin [amoxicillin-pot clavulanate]; and Oxycodone   Review of Systems Review of Systems  Constitutional: Negative for chills and fever.  Eyes: Negative for visual disturbance.  Respiratory:  Negative for shortness of breath.   Cardiovascular: Negative for chest pain.  Gastrointestinal: Negative for abdominal pain, nausea and vomiting.  Genitourinary: Negative for difficulty urinating.  Musculoskeletal: Negative for gait problem.  Skin: Positive for color change and wound.  Neurological: Negative for weakness and numbness.  Psychiatric/Behavioral: Negative for agitation.     Physical Exam Updated Vital Signs BP 126/77 (BP Location: Right Arm)   Pulse 76   Temp 98.8 F (37.1 C) (Oral)   Resp 16   Ht _0  (1.753 m)   Wt 78.4 kg (172 lb 13.5 oz)   SpO2 100%   BMI 25.52 kg/m   Physical Exam  Constitutional: She is oriented to person, place, and time. She appears well-developed and well-nourished. No distress.  Sitting at bedside in no apparent distress, nontoxic-appearing.  HENT:  Head: Normocephalic and atraumatic.  Mouth/Throat: Oropharynx is clear and moist. No oropharyngeal exudate.  Eyes: Pupils are equal, round, and reactive to light. Conjunctivae are normal. Right eye exhibits no discharge. Left eye exhibits no discharge.  Neck: Normal range of motion. Neck supple.  Cardiovascular: Normal rate, regular rhythm and intact distal pulses.  No murmur heard. Pulmonary/Chest: Effort normal and breath sounds normal. No stridor. No respiratory distress. She has no wheezes. She has no rales.  Abdominal: Soft.  Musculoskeletal:  Diffuse superficial scratches noted on bilateral forearms and over the dorsum of the hands. Mild surrounding erythema and swelling. Patient tender over right dorsum of the hand. Able to make a fist bilaterally. Radial pulses 2+ bilaterally. Motor and sensory function intact in ulnar, radial and median nerve distribution. See picture below.   Neurological: She is alert and oriented to person, place, and time. Coordination normal.  Skin: Skin is warm and dry. Capillary refill takes less than 2 seconds. She is not diaphoretic.  Psychiatric: She has a  normal mood and affect. Her behavior is normal.  Nursing note and vitals reviewed.            ED Treatments / Results  Labs (all labs ordered are listed, but only abnormal results are displayed) Labs Reviewed - No data to display  EKG None  Radiology No results found.  Procedures Procedures (including critical care time)  Medications Ordered in ED Medications  Tdap (BOOSTRIX) injection 0.5 mL (0.5 mLs Intramuscular Given 01/03/18 1457)  rabies vaccine (RABAVERT) injection 1 mL (1 mL Intramuscular Given 01/03/18 1516)  rabies immune globulin (HYPERAB/KEDRAB) injection 1,575  Units (1,575 Units Intramuscular Given 01/03/18 1521)  clindamycin (CLEOCIN) IVPB 900 mg (0 mg Intravenous Stopped 01/03/18 1556)  doxycycline (VIBRA-TABS) tablet 100 mg (100 mg Oral Given 01/03/18 1453)     Initial Impression / Assessment and Plan / ED Course  I have reviewed the triage vital signs and the nursing notes.  Pertinent labs & imaging results that were available during my care of the patient were reviewed by me and considered in my medical decision making (see chart for details).     Presents with multiple superficial cuts over bilateral forearms and hands after being attacked by a stray cat yesterday evening.  She has mild swelling and erythema surrounding the cuts.  No fevers or chills.  Vital signs stable.  She is able to make a fist, no palmar erythema or tenderness to suggest flexor tenosynovitis. Will allow wounds to be closed by secondary intent given they are superficial and bites generally at increased risk of infection with closure.   Patient treated in the emergency department with IV clindamycin and p.o. Doxycycline (she has an allergy to Augmentin).  Her Tdap was updated today.  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  She will be discharged with clindamycin and doxycycline.  Patient counseled on schedule for  rabies immunoglobulin on day 3,7and14.  Counseled her thoroughly on reasons to return to the emergency department including worsening redness/swelling, fever.  Have encouraged her to follow-up with her regular doctor in 3 days for recheck as well as for her second rabies immunoglobulin shot.  Patient agrees and voiced understanding to the above plan and appears reliable for follow-up.  This was a shared visit with Dr. Jeneen Rinks who also saw the patient and agrees with the above plan.  Final Clinical Impressions(s) / ED Diagnoses   Final diagnoses:  Cat bite, initial encounter    ED Discharge Orders        Ordered    clindamycin (CLEOCIN) 300 MG capsule  Every 6 hours     01/03/18 1642    doxycycline (VIBRAMYCIN) 100 MG capsule  2 times daily     01/03/18 1642       Bernarda Caffey 01/03/18 1646    Tanna Furry, MD 01/15/18 1458

## 2018-01-03 NOTE — ED Notes (Signed)
Bilateral arms with multiple cat scratches and redness. Right hand edematous, erythematous and painful. Limitied ROM to right hand digits due to swelling and pain from cat bite and scratches.

## 2018-01-03 NOTE — ED Provider Notes (Signed)
Patient seen and evaluated.  Discussed with PA.  Has multiple cat bites and scratches to her bilateral arms.  Has some soft tissue swelling the dorsum of her right hand.  Normal volar aspect.  No digit injuries of concern.  Each individual bite or scratch does have surrounding erythema and inflammatory reaction.  Agree with IV antibiotics.  Has Augmentin allergy.  Will use clindamycin plus doxycycline.  I have asked her no uncertain terms to recheck here at any point she feels this is worsening.  Of asked her to strictly elevate the extremity tonight.  Routine follow-up in 3 days for repeat rabies postexposure prophylaxis.  And again immediate return with any signs of worsening.   Tanna Furry, MD 01/03/18 [phone]

## 2018-01-05 ENCOUNTER — Ambulatory Visit: Payer: Self-pay | Admitting: Family Medicine

## 2018-01-05 ENCOUNTER — Other Ambulatory Visit: Payer: Self-pay

## 2018-01-05 ENCOUNTER — Encounter (HOSPITAL_BASED_OUTPATIENT_CLINIC_OR_DEPARTMENT_OTHER): Payer: Self-pay | Admitting: Emergency Medicine

## 2018-01-05 ENCOUNTER — Emergency Department (HOSPITAL_BASED_OUTPATIENT_CLINIC_OR_DEPARTMENT_OTHER)
Admission: EM | Admit: 2018-01-05 | Discharge: 2018-01-05 | Disposition: A | Discharge status: Home / Self Care | Payer: BLUE CROSS/BLUE SHIELD | Attending: Emergency Medicine | Admitting: Emergency Medicine

## 2018-01-05 DIAGNOSIS — R1084 Generalized abdominal pain: Secondary | ICD-10-CM | POA: Diagnosis not present

## 2018-01-05 DIAGNOSIS — Z5189 Encounter for other specified aftercare: Secondary | ICD-10-CM | POA: Insufficient documentation

## 2018-01-05 DIAGNOSIS — M79602 Pain in left arm: Secondary | ICD-10-CM | POA: Insufficient documentation

## 2018-01-05 DIAGNOSIS — F17200 Nicotine dependence, unspecified, uncomplicated: Secondary | ICD-10-CM | POA: Insufficient documentation

## 2018-01-05 MED ORDER — DICYCLOMINE HCL 20 MG PO TABS: 20.0000 mg | ORAL_TABLET | Freq: Two times a day (BID) | ORAL | 0 refills | Status: DC

## 2018-01-05 NOTE — ED Provider Notes (Signed)
[facility] EMERGENCY DEPARTMENT Provider Note   CSN: [id number] Arrival date & time: 01/05/18  0321     History   Chief Complaint Chief Complaint  Patient presents with  . Arm Pain  . Abdominal Pain    HPI Anne Lewis is a 52 y.o. female has no history of H. pylori, who presents for evaluation of right upper extremity pain.  Patient reports that 4 days ago, she was bitten by stray cats and had multiple scratch wounds noted to her bilateral upper extremities.  Patient was seen here on 01/03/2018 for evaluation of wounds.  At that time, she received IV antibiotics.  She was discharged home with kanamycin and doxycycline which she states she has been taking.  Patient reports improvement in the wounds to her upper extremities.  She comes the emergency department today because she is having some tenderness and pain to her right axilla.  Patient states she has not noticed any redness or streaking of the upper extremity.  Has not noticed any warmth, erythema, palpable mass noted to the right axilla.  She does report that the right upper extremity is where she got her tetanus shot when she was here previously.  Patient states that she is also having some diarrhea associated with her antibiotics.  Patient states that the antibiotics have been upsetting her stomach.  She has been able to eat and drink without any difficulty.  No blood noted in the diarrhea.  No fevers.  Patient denies any chest pain, difficulty breathing, drainage from wound sites, numbness/weakness.  The history is provided by the patient.    Past Medical History:  Diagnosis Date  . BRCA negative 11/2013  . H pylori ulcer   . Ulcer of gastric fundus     Patient Active Problem List   Diagnosis Date Noted  . Acute non-recurrent maxillary sinusitis 08/19/2017  . Lateral epicondylitis of left elbow 08/19/2017  . Cough 08/19/2017  . Healthcare maintenance 08/19/2017  . Family history of malignant neoplasm of  breast 11/08/2013  . Family history of malignant neoplasm of gastrointestinal tract 11/08/2013  . Family history of malignant neoplasm of ovary 11/08/2013  . RUQ PAIN 08/08/2010  . DYSPEPSIA&OTHER [facility] DISORDERS FUNCTION STOMACH 08/07/2010  . ABDOMINAL PAIN, EPIGASTRIC 08/07/2010  . OTHER CONSTIPATION 12/05/2009    Past Surgical History:  Procedure Laterality Date  . CESAREAN SECTION  [id number]  . ENDOMETRIAL CRYOABLATION  12.19.2012   HER OPTION - office performed  . TONSILLECTOMY  1987     OB History    Gravida  3   Para  3   Term      Preterm      AB      Living  3     SAB      TAB      Ectopic      Multiple      Live Births               Home Medications    Prior to Admission medications   Medication Sig Start Date End Date Taking? Authorizing Provider  amoxicillin (AMOXIL) 500 MG capsule Take 1 capsule (500 mg total) by mouth 3 (three) times daily. 08/19/17   Libby Maw, MD  clindamycin (CLEOCIN) 300 MG capsule Take 1 capsule (300 mg total) by mouth 3 (three) times daily. 09/06/17   Rogue Bussing, MD  clindamycin (CLEOCIN) 300 MG capsule Take 1 capsule (300 mg total) by mouth every 6 (six) hours for  10 days. 01/03/18 01/13/18  Glyn Ade, PA-C  dicyclomine (BENTYL) 20 MG tablet Take 1 tablet (20 mg total) by mouth 2 (two) times daily. 01/05/18   Volanda Napoleon, PA-C  doxycycline (VIBRAMYCIN) 100 MG capsule Take 1 capsule (100 mg total) by mouth 2 (two) times daily. 01/03/18   Glyn Ade, PA-C  sodium chloride (OCEAN) 0.65 % SOLN nasal spray Place 2 sprays into both nostrils as needed for congestion. 08/19/17   Libby Maw, MD  traMADol (ULTRAM) 50 MG tablet Take 1 tablet (50 mg total) by mouth every 8 (eight) hours as needed. 09/06/17   Rogue Bussing, MD    Family History Family History  Problem Relation Age of Onset  . Cancer Cousin 39       ovarian  . Cancer Cousin 61       breast  .  Hypertension Mother   . Cancer Mother 48       intestational and ovarian  . Diverticulitis Mother   . Diabetes Father   . Hypertension Father   . Heart disease Father   . Cancer Sister 87       ovarian and intestional  . Breast cancer Maternal Aunt 60  . Cancer Brother 24       GI cancer  . Cancer Paternal Aunt 55       stomach  . Cancer Maternal Grandmother 23       ovarian and pancreatic  . Cancer Maternal Grandfather 82       pancreatic    Social History Social History   Tobacco Use  . Smoking status: Current Some Day Smoker  . Smokeless tobacco: Never Used  Substance Use Topics  . Alcohol use: Yes    Comment: Social  . Drug use: No     Allergies   Aspirin; Augmentin [amoxicillin-pot clavulanate]; and Oxycodone   Review of Systems Review of Systems  Constitutional: Negative for fever.  Respiratory: Negative for shortness of breath.   Cardiovascular: Negative for chest pain.  Gastrointestinal: Positive for abdominal pain and diarrhea. Negative for nausea and vomiting.  Genitourinary: Negative for dysuria and hematuria.  Skin: Positive for wound. Negative for color change.  Neurological: Negative for weakness, numbness and headaches.  All other systems reviewed and are negative.    Physical Exam Updated Vital Signs BP (!) 127/91 (BP Location: Left Arm)   Pulse 83   Temp 98.3 F (36.8 C) (Oral)   Resp 16   SpO2 100%   Physical Exam  Constitutional: She is oriented to person, place, and time. She appears well-developed and well-nourished.  HENT:  Head: Normocephalic and atraumatic.  Mouth/Throat: Oropharynx is clear and moist and mucous membranes are normal.  Eyes: Pupils are equal, round, and reactive to light. Conjunctivae, EOM and lids are normal.  Neck: Full passive range of motion without pain.  Cardiovascular: Normal rate, regular rhythm, normal heart sounds and normal pulses. Exam reveals no gallop and no friction rub.  No murmur  heard. Pulses:      Radial pulses are 2+ on the right side, and 2+ on the left side.  Pulmonary/Chest: Effort normal and breath sounds normal.  Abdominal: Soft. Normal appearance. There is no tenderness. There is no rigidity and no guarding.  Abdomen is soft, non-distended, non-tender. No rigidity, No guarding. No peritoneal signs.  No CVA tenderness bilaterally.  Musculoskeletal: Normal range of motion.  Tenderness palpation noted to the right axilla.  No overlying warmth, erythema, swelling.  No fluctuance noted.  No evidence of erythematous streaking from the right upper extremity.  Full range of motion of right wrist intact without any difficulty.  Flexion/extension of all 5 digits of right hand intact without any difficulty.  No warmth, erythema, edema of all 5 right digits.  Neurological: She is alert and oriented to person, place, and time.  Skin: Skin is warm and dry. Capillary refill takes less than 2 seconds.  Scattered superficial abrasions noted to bilateral upper extremities without any surrounding warmth, erythema, fluctuance.  No active drainage from the wounds. Good distal cap refill.  BUE is not dusky in appearance or cool to touch.  Psychiatric: She has a normal mood and affect. Her speech is normal.  Nursing note and vitals reviewed.        ED Treatments / Results  Labs (all labs ordered are listed, but only abnormal results are displayed) Labs Reviewed - No data to display  EKG None  Radiology No results found.  Procedures Procedures (including critical care time)  Medications Ordered in ED Medications - No data to display   Initial Impression / Assessment and Plan / ED Course  I have reviewed the triage vital signs and the nursing notes.  Pertinent labs & imaging results that were available during my care of the patient were reviewed by me and considered in my medical decision making (see chart for details).     52 year old female who presents for  evaluation of right upper extremity pain.  Seen here in the ED 2 days ago for evaluation of cat scratches that occurred to bilateral upper extremities 3 days ago.  Was started on clindamycin and doxycycline which she states she has been taking.  Comes in today for evaluation of right axilla pain.  Reports she feels like it is swollen.  Has not noticed any warmth, erythema.  No fevers.  She reports that she has not had any redness, warmth, erythema, drainage from her abrasions.  She has been taking her antibiotics without any difficulty.  Also reports that the antibiotic's are upsetting her stomach.  States that she is having some diarrhea.  Denies any abdominal pain at this time. Patient is afebrile, non-toxic appearing, sitting comfortably on examination table. Vital signs reviewed and stable.  Patient is neurovascularly intact.  Patient has tenderness palpation to the left axilla with overlying warmth, erythema, evidence of abscess.  There is no streaking that would indicate lymphangitis.  We will plan for bedside ultrasound evaluation.  History/physical exam is not concerning for DVT, septic arthritis, acute arterial embolism.  Abdomen exam is benign.  She states she has been not having any fever, nausea/vomiting.  She just feels like her stomach is upset from the robotics.  History/physical exam is not concerning for perforation, small bowel obstruction, appendicitis.  Outside ultrasound reviewed.  No evidence of abscess that required incision and drainage.  I suspect that this may be muscular skeletal pain.  Additionally, patient reports this is where she got her tetanus shot yesterday.  No evidence of acute emergent condition here in the ED.  No evidence of wound infection.  Encourage patient to continue taking antibiotics and provided insertion on wound care. Patient had ample opportunity for questions and discussion. All patient's questions were answered with full understanding. Strict return precautions  discussed. Patient expresses understanding and agreement to plan.   EMERGENCY DEPARTMENT US SOFT TISSUE INTERPRETATION "Study: Limited Soft Tissue Ultrasound"  INDICATIONS: Pain Multiple views of the body part were obtained in  real-time with a multi-frequency linear probe  PERFORMED BY: Myself IMAGES ARCHIVED?: Yes SIDE:Right  BODY PART:Axilla INTERPRETATION:  No abcess noted and No cellulitis noted   Final Clinical Impressions(s) / ED Diagnoses   Final diagnoses:  Left arm pain  Visit for wound check  Generalized abdominal pain    ED Discharge Orders        Ordered    dicyclomine (BENTYL) 20 MG tablet  2 times daily     01/05/18 1258       Desma Mcgregor 01/05/18 2101    Malvin Johns, MD 01/06/18 1643

## 2018-01-05 NOTE — ED Triage Notes (Signed)
Reports animal bite by stray cat friday.  Swelling to right hand with multiple scratch wounds to arms and right hand with swelling - was treated with rabies shots, and antibiotics. She reports that she is now having pain and a lump to her right axilla region

## 2018-01-05 NOTE — Telephone Encounter (Signed)
fyi

## 2018-01-05 NOTE — Telephone Encounter (Signed)
Called in c/o sharp pain and swelling in her upper arm and under her right arm pit that started last night that is in the area of her breast too.    She was bitten by a stray cat on Friday night at 10:30PM.   She went to the ED on Saturday and was treated.   She was started on antibiotics and rabies vaccine shots.   She was also given IV antibiotics in the ED.  I checked with Dr.  Bebe Shaggy nurse and they want her to go to the ED.   I let her know she needed to go back to the ED.   She is going to go to the ED at [facility].   Reason for Disposition . Looks infected (red area, red streak, OR pus)  Answer Assessment - Initial Assessment Questions 1. ANIMAL: "What type of animal caused the bite?" "Is the injury from a bite or a claw?" If the animal is a dog or a cat, ask: "Was it a pet or a stray?" "Was it acting ill or behaving strangely?"     Cat bites on 01/03/18.   Went to ED.   I'm getting swelling under my armpit and breast on my right arm.   I wasn't bitten there.   I'm hurting so bad.   The pain woke me up in the middle of the night.    2. LOCATION: "Where is the bite located?"      Swelling and hurting under my armpit and breast on the right side.   I was seen in the ED. 3. SIZE: "How big is the bite?" "What does it look like?"      I was bitten on my arm and hand on right.   I was bitten Friday night about 10:30.   I went to the ED on Saturday.    I'm getting the rabies shots. 4. ONSET: "When did the bite happen?" (Minutes or hours ago)      See above 5. CIRCUMSTANCES: "Tell me how this happened."      A stray cat bit me Friday night.   6. TETANUS: "When was the last tetanus booster?"     Gave me a tetanus shot in ED 7. PREGNANCY: "Is there any chance you are pregnant?" "When was your last menstrual period?"     No  Protocols used: ANIMAL BITE-A-AH

## 2018-01-05 NOTE — Discharge Instructions (Signed)
As we discussed, apply warm compresses to the area.  Continue taking the antibiotics.  You can take Tylenol or Ibuprofen as directed for pain. You can alternate Tylenol and Ibuprofen every 4 hours. If you take Tylenol at 1pm, then you can take Ibuprofen at 5pm. Then you can take Tylenol again at 9pm.   You can take Bentyl as directed.  Follow-up for wound check and rabies vaccine as directed.  Return to emergency department for any  redness, swelling of the arm, streaking of redness on the arm, fevers, drainage from your wounds, worsening pain or any other worsening or concerning symptoms.   If your symptoms persist after your wounds clear, follow-up with the breast imaging center in [facility] for further evaluation.

## 2018-01-05 NOTE — ED Notes (Signed)
ED Provider at bedside for bedside US of right axilla.

## 2018-01-06 ENCOUNTER — Ambulatory Visit (HOSPITAL_COMMUNITY)
Admission: EM | Admit: 2018-01-06 | Discharge: 2018-01-06 | Disposition: A | Discharge status: Home / Self Care | Payer: BLUE CROSS/BLUE SHIELD | Attending: Internal Medicine | Admitting: Internal Medicine

## 2018-01-06 ENCOUNTER — Other Ambulatory Visit: Payer: Self-pay

## 2018-01-06 ENCOUNTER — Encounter (HOSPITAL_COMMUNITY): Payer: Self-pay | Admitting: Emergency Medicine

## 2018-01-06 DIAGNOSIS — Z23 Encounter for immunization: Secondary | ICD-10-CM | POA: Diagnosis not present

## 2018-01-06 DIAGNOSIS — W5501XA Bitten by cat, initial encounter: Secondary | ICD-10-CM

## 2018-01-06 DIAGNOSIS — Z2914 Encounter for prophylactic rabies immune globin: Secondary | ICD-10-CM

## 2018-01-06 DIAGNOSIS — Z203 Contact with and (suspected) exposure to rabies: Secondary | ICD-10-CM

## 2018-01-06 MED ORDER — RABIES VACCINE, PCEC IM SUSR
INTRAMUSCULAR | Status: AC
  Filled 2018-01-06: qty 1

## 2018-01-06 MED ORDER — RABIES VACCINE, PCEC IM SUSR
1.0000 mL | Freq: Once | INTRAMUSCULAR | Status: AC
  Administered 2018-01-06: 1 mL via INTRAMUSCULAR

## 2018-01-06 NOTE — ED Triage Notes (Signed)
Patient is in department for a rabies injection

## 2018-01-10 ENCOUNTER — Ambulatory Visit (HOSPITAL_COMMUNITY)
Admission: EM | Admit: 2018-01-10 | Discharge: 2018-01-10 | Disposition: A | Discharge status: Home / Self Care | Payer: BLUE CROSS/BLUE SHIELD | Attending: Internal Medicine | Admitting: Internal Medicine

## 2018-01-10 ENCOUNTER — Encounter (HOSPITAL_COMMUNITY): Payer: Self-pay | Admitting: Emergency Medicine

## 2018-01-10 ENCOUNTER — Other Ambulatory Visit: Payer: Self-pay

## 2018-01-10 DIAGNOSIS — Z203 Contact with and (suspected) exposure to rabies: Secondary | ICD-10-CM

## 2018-01-10 DIAGNOSIS — Z23 Encounter for immunization: Secondary | ICD-10-CM | POA: Diagnosis not present

## 2018-01-10 DIAGNOSIS — W5501XD Bitten by cat, subsequent encounter: Secondary | ICD-10-CM

## 2018-01-10 MED ORDER — RABIES VACCINE, PCEC IM SUSR
INTRAMUSCULAR | Status: AC
  Filled 2018-01-10: qty 1

## 2018-01-10 MED ORDER — RABIES VACCINE, PCEC IM SUSR
1.0000 mL | Freq: Once | INTRAMUSCULAR | Status: AC
  Administered 2018-01-10: 1 mL via INTRAMUSCULAR

## 2018-01-10 NOTE — Discharge Instructions (Signed)
Please return to the Urgent Care Center on 01/17/2018 for the final vaccination.

## 2018-01-10 NOTE — ED Triage Notes (Addendum)
The patient presented to the [facility] to receive the Day 7 rabies vaccination.

## 2018-01-10 NOTE — ED Notes (Signed)
Bed: UC01 Expected date:  Expected time:  Means of arrival:  Comments: Hold for Appointments

## 2018-01-17 ENCOUNTER — Ambulatory Visit (HOSPITAL_COMMUNITY)
Admission: EM | Admit: 2018-01-17 | Discharge: 2018-01-17 | Disposition: A | Discharge status: Home / Self Care | Payer: BLUE CROSS/BLUE SHIELD | Attending: Family Medicine | Admitting: Family Medicine

## 2018-01-17 DIAGNOSIS — W5501XD Bitten by cat, subsequent encounter: Secondary | ICD-10-CM | POA: Diagnosis not present

## 2018-01-17 DIAGNOSIS — Z23 Encounter for immunization: Secondary | ICD-10-CM | POA: Diagnosis not present

## 2018-01-17 DIAGNOSIS — Z203 Contact with and (suspected) exposure to rabies: Secondary | ICD-10-CM

## 2018-01-17 MED ORDER — RABIES VACCINE, PCEC IM SUSR
1.0000 mL | Freq: Once | INTRAMUSCULAR | Status: AC
  Administered 2018-01-17: 1 mL via INTRAMUSCULAR

## 2018-01-17 MED ORDER — RABIES VACCINE, PCEC IM SUSR
INTRAMUSCULAR | Status: AC
  Filled 2018-01-17: qty 1

## 2018-01-17 NOTE — ED Notes (Signed)
Bed: UC01 Expected date:  Expected time:  Means of arrival:  Comments: appt 

## 2018-01-17 NOTE — Discharge Instructions (Signed)
Take OTC Zyrtec immediately upon leaving, as you declined taking it couple hours before having final rabies injection.  If you have any throat or oral swelling, itching, numbness/tingling, please go to the Emergency Dept immediately.

## 2018-01-17 NOTE — ED Triage Notes (Signed)
Pt reports having a bumpy, burning rash to BUE after each rabies injection, with the most recent one being worse.  Denies any throat or oral swelling, sensations, or itching following adminstrations.  States had also been having a change in abx, which is now complete.  States she took Benadryl after most recent rabies injection, but felt it made rash worse.  States Zyrtec helped approx 5 hrs after taking it.  Discussed above with Dr Joseph Art & Dr Gwendlyn Deutscher - due to Benadryl making rash worse per pt, encouraged pt to go home and take a dose of Zyrtec, wait a couple hours, then return here to get final rabies injection.  Pt begged to just give her the final shot now, and states she promises to take Zyrtec immediately upon leaving [facility].  OK per physicians if pt insists and warnings to pt documented.  Pt Instructed to go to ED immediately if she experiences any throat or oral sxs.  Pt verbalized understanding.

## 2018-04-04 ENCOUNTER — Emergency Department (HOSPITAL_BASED_OUTPATIENT_CLINIC_OR_DEPARTMENT_OTHER): Payer: BLUE CROSS/BLUE SHIELD

## 2018-04-04 ENCOUNTER — Other Ambulatory Visit: Payer: Self-pay

## 2018-04-04 ENCOUNTER — Emergency Department (HOSPITAL_BASED_OUTPATIENT_CLINIC_OR_DEPARTMENT_OTHER)
Admission: EM | Admit: 2018-04-04 | Discharge: 2018-04-04 | Disposition: A | Discharge status: Home / Self Care | Payer: BLUE CROSS/BLUE SHIELD | Attending: Emergency Medicine | Admitting: Emergency Medicine

## 2018-04-04 ENCOUNTER — Encounter (HOSPITAL_BASED_OUTPATIENT_CLINIC_OR_DEPARTMENT_OTHER): Payer: Self-pay | Admitting: *Deleted

## 2018-04-04 DIAGNOSIS — F17210 Nicotine dependence, cigarettes, uncomplicated: Secondary | ICD-10-CM | POA: Insufficient documentation

## 2018-04-04 DIAGNOSIS — Z79899 Other long term (current) drug therapy: Secondary | ICD-10-CM | POA: Insufficient documentation

## 2018-04-04 DIAGNOSIS — M542 Cervicalgia: Secondary | ICD-10-CM | POA: Diagnosis present

## 2018-04-04 DIAGNOSIS — J029 Acute pharyngitis, unspecified: Secondary | ICD-10-CM | POA: Insufficient documentation

## 2018-04-04 DIAGNOSIS — M5412 Radiculopathy, cervical region: Secondary | ICD-10-CM

## 2018-04-04 LAB — GROUP A STREP BY PCR: GROUP A STREP BY PCR: NOT DETECTED

## 2018-04-04 MED ORDER — METHOCARBAMOL 500 MG PO TABS: 1000.0000 mg | ORAL_TABLET | Freq: Four times a day (QID) | ORAL | 0 refills | Status: DC

## 2018-04-04 MED ORDER — PREDNISONE 20 MG PO TABS: ORAL_TABLET | ORAL | 0 refills | Status: DC

## 2018-04-04 NOTE — ED Notes (Signed)
Pt given water 

## 2018-04-04 NOTE — Discharge Instructions (Signed)
Please read and follow all provided instructions.  Your diagnoses today include:  1. Cervical radiculopathy at C6   2. Sore throat     Tests performed today include:  Vital signs - see below for your results today  Cervical spine x-ray - shows some arthritis and narrowing, especially at the C5 and C6 level on the right  Medications prescribed:   Prednisone - steroid medicine   It is best to take this medication in the morning to prevent sleeping problems. If you are diabetic, monitor your blood sugar closely and stop taking Prednisone if blood sugar is over 300. Take with food to prevent stomach upset.    Robaxin (methocarbamol) - muscle relaxer medication  DO NOT drive or perform any activities that require you to be awake and alert because this medicine can make you drowsy.   Take any prescribed medications only as directed.  Home care instructions:   Follow any educational materials contained in this packet  Please rest, use ice or heat on your back for the next several days  Do not lift, push, pull anything more than 10 pounds for the next week  Follow-up instructions: Please follow-up with your primary care provider and the spine doctor in the next 1 week for further evaluation of your symptoms.   Return instructions:  SEEK IMMEDIATE MEDICAL ATTENTION IF YOU HAVE:  New numbness, tingling, weakness, or problem with the use of your arms or legs  Severe back pain not relieved with medications  Loss control of your bowels or bladder  Increasing pain in any areas of the body (such as chest or abdominal pain)  Shortness of breath, dizziness, or fainting.   Worsening nausea (feeling sick to your stomach), vomiting, fever, or sweats  Any other emergent concerns regarding your health   Additional Information:  Your vital signs today were: BP 121/64 (BP Location: Right Arm)    Pulse 77    Temp 98.3 F (36.8 C) (Oral)    Resp 16    Ht 5\' 9"  (1.753 m)    Wt 73.5 kg     LMP 03/14/2018    SpO2 100%    BMI 23.92 kg/m  If your blood pressure (BP) was elevated above 135/85 this visit, please have this repeated by your doctor within one month. --------------

## 2018-04-04 NOTE — ED Triage Notes (Signed)
Pain started to posterior side of her head that radiates to her neck.  Pain to head went away.  Painful to swallow and body aches, feeling of numbness and pin need;es to little and ring right fingers for few days.

## 2018-04-04 NOTE — ED Notes (Signed)
Pt to XR at this time

## 2018-04-04 NOTE — ED Provider Notes (Signed)
[facility]GENCY DEPARTMENT Provider Note   CSN: [id number] Arrival date & time: 04/04/18  1218     History   Chief Complaint Chief Complaint  Patient presents with  . Sore Throat  . Neck Pain    HPI Anne Lewis is a 52 y.o. female.  Patient with history of stomach ulcer presents to emergency department with several weeks of right-sided neck pain that radiates down into her right arm.  Patient felt that this was just a pinched nerve.  She has had some tingling in her fourth and fifth digits of her right hand.  She reports some weakness but cannot give any definite examples of how this has affected her daily activities.  She has been taking Aleve for this.  About 4 days ago the symptoms worsen.  She developed a sore throat with chills and a fever reported to 101 F noted one time.  She has also generalized body pain.  No worsening weakness.  No difficulty with numbness or tingling, weakness in her legs or difficulty with walking.  Onset of symptoms acute.  Course is persistent.     Past Medical History:  Diagnosis Date  . BRCA negative 11/2013  . H pylori ulcer   . Ulcer of gastric fundus     Patient Active Problem List   Diagnosis Date Noted  . Acute non-recurrent maxillary sinusitis 08/19/2017  . Lateral epicondylitis of left elbow 08/19/2017  . Cough 08/19/2017  . Healthcare maintenance 08/19/2017  . Family history of malignant neoplasm of breast 11/08/2013  . Family history of malignant neoplasm of gastrointestinal tract 11/08/2013  . Family history of malignant neoplasm of ovary 11/08/2013  . RUQ PAIN 08/08/2010  . DYSPEPSIA&OTHER [facility] DISORDERS FUNCTION STOMACH 08/07/2010  . ABDOMINAL PAIN, EPIGASTRIC 08/07/2010  . OTHER CONSTIPATION 12/05/2009    Past Surgical History:  Procedure Laterality Date  . CESAREAN SECTION  [id number]  . ENDOMETRIAL CRYOABLATION  12.19.2012   HER OPTION - office performed  . TONSILLECTOMY  1987     OB History      Gravida  3   Para  3   Term      Preterm      AB      Living  3     SAB      TAB      Ectopic      Multiple      Live Births               Home Medications    Prior to Admission medications   Medication Sig Start Date End Date Taking? Authorizing Provider  dicyclomine (BENTYL) 20 MG tablet Take 1 tablet (20 mg total) by mouth 2 (two) times daily. 01/05/18   Volanda Napoleon, PA-C  sodium chloride (OCEAN) 0.65 % SOLN nasal spray Place 2 sprays into both nostrils as needed for congestion. 08/19/17   Libby Maw, MD  traMADol (ULTRAM) 50 MG tablet Take 1 tablet (50 mg total) by mouth every 8 (eight) hours as needed. 09/06/17   Rogue Bussing, MD    Family History Family History  Problem Relation Age of Onset  . Cancer Cousin 39       ovarian  . Cancer Cousin 62       breast  . Hypertension Mother   . Cancer Mother 36       intestational and ovarian  . Diverticulitis Mother   . Diabetes Father   . Hypertension Father   .  Heart disease Father   . Cancer Sister 66       ovarian and intestional  . Breast cancer Maternal Aunt 60  . Cancer Brother 24       GI cancer  . Cancer Paternal Aunt 93       stomach  . Cancer Maternal Grandmother 13       ovarian and pancreatic  . Cancer Maternal Grandfather 82       pancreatic    Social History Social History   Tobacco Use  . Smoking status: Current Some Day Smoker  . Smokeless tobacco: Never Used  Substance Use Topics  . Alcohol use: Yes    Comment: Social  . Drug use: No     Allergies   Aspirin; Augmentin [amoxicillin-pot clavulanate]; and Oxycodone   Review of Systems Review of Systems  Constitutional: Positive for fever. Negative for unexpected weight change.  HENT: Positive for sore throat. Negative for congestion.   Gastrointestinal: Negative for constipation, nausea and vomiting.       Negative for fecal incontinence.   Genitourinary: Negative for dysuria, flank pain,  hematuria, pelvic pain, vaginal bleeding and vaginal discharge.       Negative for urinary incontinence or retention.  Musculoskeletal: Positive for myalgias and neck pain. Negative for back pain.  Neurological: Positive for weakness and numbness. Negative for headaches.       Denies saddle paresthesias.     Physical Exam Updated Vital Signs BP 130/77 (BP Location: Left Arm)   Pulse 93   Temp 98.3 F (36.8 C) (Oral)   Resp 18   Ht _0  (1.753 m)   Wt 73.5 kg   LMP 03/14/2018   SpO2 100%   BMI 23.92 kg/m   Physical Exam  Constitutional: She appears well-developed and well-nourished.  HENT:  Head: Normocephalic and atraumatic.  Right Ear: Tympanic membrane, external ear and ear canal normal.  Left Ear: Tympanic membrane, external ear and ear canal normal.  Nose: No mucosal edema or rhinorrhea.  Mouth/Throat: Mucous membranes are normal. Posterior oropharyngeal erythema present. No oropharyngeal exudate, posterior oropharyngeal edema or tonsillar abscesses. No tonsillar exudate.  Eyes: Conjunctivae are normal. Right eye exhibits no discharge. Left eye exhibits no discharge.  Neck: Normal range of motion. Neck supple.  Cardiovascular: Normal rate, regular rhythm and normal heart sounds.  Pulmonary/Chest: Effort normal and breath sounds normal.  Abdominal: Soft. There is no tenderness. There is no CVA tenderness.  Musculoskeletal: She exhibits no edema or tenderness.       Cervical back: She exhibits decreased range of motion (Slightly decreased with rotation to the right). She exhibits no bony tenderness.       Thoracic back: She exhibits normal range of motion, no tenderness and no bony tenderness.       Lumbar back: She exhibits normal range of motion, no tenderness and no bony tenderness.       Back:  No step-off noted with palpation of spine.   Neurological: She is alert. She has normal strength and normal reflexes. No sensory deficit.  5/5 strength in entire upper and  extremities bilaterally.  Good grip strength, flexion extension and upper extremities.  She moves well with grossly normal coordination.  Skin: Skin is warm and dry. No rash noted.  Psychiatric: She has a normal mood and affect.  Nursing note and vitals reviewed.    ED Treatments / Results  Labs (all labs ordered are listed, but only abnormal results are displayed) Labs Reviewed  GROUP A STREP BY PCR    EKG None  Radiology Dg Cervical Spine Complete  Result Date: 04/04/2018 CLINICAL DATA:  Right-sided neck pain, no known injury, initial encounter EXAM: CERVICAL SPINE - COMPLETE 4+ VIEW COMPARISON:  None. FINDINGS: Loss of the normal cervical lordosis is noted. Seven cervical segments are well visualized. Vertebral body height is well maintained. Disc space narrowing at C5-6 is noted with mild osteophytic changes. No neural foraminal changes are seen on the left. Mild neural foraminal changes are noted at C5-6 on the right. No soft tissue abnormality is noted. The odontoid is within normal limits. IMPRESSION: Degenerative changes as described. Electronically Signed   By: Inez Catalina M.D.   On: 04/04/2018 14:51    Procedures Procedures (including critical care time)  Medications Ordered in ED Medications - No data to display   Initial Impression / Assessment and Plan / ED Course  I have reviewed the triage vital signs and the nursing notes.  Pertinent labs & imaging results that were available during my care of the patient were reviewed by me and considered in my medical decision making (see chart for details).     Patient seen and examined.  Initial impression is that patient has had cervical radiculopathy into the right arm over the past couple of weeks.  It seems that with development of fever, sore throat, difficulty swallowing, generalized body aches, she may have a infection as well.  She has good range of motion of the neck and I do not suspect any deep space infection in  the neck at this time.  No meningismus.  She has no lower extremity involvement to suggest central cord syndrome.  Will check strep test and pain films.  Anticipate discharged home with conservative measures.  Vital signs reviewed and are as follows: BP 130/77 (BP Location: Left Arm)   Pulse 93   Temp 98.3 F (36.8 C) (Oral)   Resp 18   Ht _0  (1.753 m)   Wt 73.5 kg   LMP 03/14/2018   SpO2 100%   BMI 23.92 kg/m   3:10 PM patient updated on x-ray results.  Discussed signs of degeneration as noted.  Strep test is negative.  Encourage patient follow-up with her primary care doctor and patient also given neurosurgery referral for further evaluation.  Home on prednisone and Robaxin.  Patient counseled on proper use of muscle relaxant medication.  They were told not to drink alcohol, drive any vehicle, or do any dangerous activities while taking this medication.  Patient verbalized understanding.  Encouraged return to emergency department with worsening symptoms, inability to swallow, high fever, trouble walking or symptoms in her legs.  Final Clinical Impressions(s) / ED Diagnoses   Final diagnoses:  Cervical radiculopathy at C6  Sore throat   Patient with several weeks of cervical radicular type symptoms.  Degeneration noted on C-spine films.  No gross neurological deficit noted on exam here.  No indication for emergent consultation or MRI.  No red flag symptoms of neck/back pain.  Sore throat, generalized body aches, reported fever.  Full range of motion of neck and I do not suspect spinal abscess or deep space infection in neck.  Strep negative.  Possibly related to patient's radiculopathy, also consider infection etiology.  ED Discharge Orders         Ordered    predniSONE (DELTASONE) 20 MG tablet     04/04/18 1510    methocarbamol (ROBAXIN) 500 MG tablet  4 times daily  04/04/18 1510           Carlisle Cater, PA-C 04/04/18 1514    Fredia Sorrow, MD 04/10/18  2008

## 2018-04-04 NOTE — ED Notes (Signed)
ED Provider at bedside. 

## 2018-04-16 ENCOUNTER — Ambulatory Visit: Payer: BLUE CROSS/BLUE SHIELD | Admitting: Family Medicine

## 2018-04-16 ENCOUNTER — Encounter: Payer: Self-pay | Admitting: Family Medicine

## 2018-04-16 VITALS — BP 138/80 | HR 99 | Ht 69.0 in | Wt 162.0 lb

## 2018-04-16 DIAGNOSIS — K0889 Other specified disorders of teeth and supporting structures: Secondary | ICD-10-CM | POA: Diagnosis not present

## 2018-04-16 DIAGNOSIS — L509 Urticaria, unspecified: Secondary | ICD-10-CM | POA: Diagnosis not present

## 2018-04-16 DIAGNOSIS — M4722 Other spondylosis with radiculopathy, cervical region: Secondary | ICD-10-CM | POA: Diagnosis not present

## 2018-04-16 MED ORDER — CYCLOBENZAPRINE HCL 10 MG PO TABS: 10.0000 mg | ORAL_TABLET | Freq: Every day | ORAL | 0 refills | Status: DC

## 2018-04-16 MED ORDER — CETIRIZINE HCL 10 MG PO TABS: 10.0000 mg | ORAL_TABLET | Freq: Every day | ORAL | 0 refills | Status: DC

## 2018-04-16 MED ORDER — TRAMADOL HCL 50 MG PO TABS: 50.0000 mg | ORAL_TABLET | Freq: Three times a day (TID) | ORAL | 0 refills | Status: DC | PRN

## 2018-04-16 MED ORDER — CYCLOBENZAPRINE HCL 10 MG PO TABS
10.0000 mg | ORAL_TABLET | Freq: Every day | ORAL | 0 refills | Status: DC
Start: 2018-04-16 — End: 2019-08-30

## 2018-04-16 NOTE — Patient Instructions (Signed)
Hives Hives (urticaria) are itchy, red, swollen areas on your skin. Hives can show up on any part of your body, and they can vary in size. They can be as small as the tip of a pen or much larger. Hives often fade within 24 hours (acute hives). In other cases, new hives show up after old ones fade. This can continue for many days or weeks (chronic hives). Hives are caused by your body's reaction to an irritant or to something that you are allergic to (trigger). You can get hives right after being around a trigger or hours later. Hives do not spread from person to person (are not contagious). Hives may get worse if you scratch them, if you exercise, or if you have worries (emotional stress). Follow these instructions at home: Medicines  Take or apply over-the-counter and prescription medicines only as told by your doctor.  If you were prescribed an antibiotic medicine, use it as told by your doctor. Do not stop taking the antibiotic even if you start to feel better. Skin Care  Apply cool, wet cloths (cool compresses) to the itchy, red, swollen areas.  Do not scratch your skin. Do not rub your skin. General instructions  Do not take hot showers or baths. This can make itching worse.  Do not wear tight clothes.  Use sunscreen and wear clothing that covers your skin when you are outside.  Avoid any triggers that cause your hives. Keep a journal to help you keep track of what causes your hives. Write down: ? What medicines you take. ? What you eat and drink. ? What products you use on your skin.  Keep all follow-up visits as told by your doctor. This is important. Contact a doctor if:  Your symptoms are not better with medicine.  Your joints are painful or swollen. Get help right away if:  You have a fever.  You have belly pain.  Your tongue or lips are swollen.  Your eyelids are swollen.  Your chest or throat feels tight.  You have trouble breathing or swallowing. These  symptoms may be an emergency. Do not wait to see if the symptoms will go away. Get medical help right away. Call your local emergency services (911 in the [place] Do not drive yourself to the hospital. This information is not intended to replace advice given to you by your health care provider. Make sure you discuss any questions you have with your health care provider. Document Released: 04/16/2008 Document Revised: 12/14/2015 Document Reviewed: 04/26/2015 Elsevier Interactive Patient Education  2018 [employer]  

## 2018-04-16 NOTE — Progress Notes (Signed)
Subjective:  Patient ID: Anne Lewis, female    DOB: July 05, 1966  Age: 52 y.o. MRN: [id number]  CC: Hospitalization Follow-up   HPI Anne Lewis presents for follow-up of right sided posterior neck pain.  Recent films obtained in the emergency room did show osteoarthritic changes that fortunately were mild.  Patient continues to feel spasms in the right posterior area.  She is having some tingling in the right fifth finger.  She has been given Robaxin which did not agree with her.  She says that it made her hyper.  She has been taking Aleve twice a day without much effect.  Ultram has been offered but she declined the prescription.  She also shows me pictures migratory erythematous wheals that have been occurring about her body.  These had started after she had been on multiple antibiotics for a recent cat bite last month.  Unfortunately they continue to occur even though she did finish the antibiotics a few weeks ago.  She has been under great deal of stress.  She has been clenching her jaws and now has right lower molar pain.  Outpatient Medications Prior to Visit  Medication Sig Dispense Refill  . predniSONE (DELTASONE) 20 MG tablet 3 Tabs PO Days 1-3, then 2 tabs PO Days 4-6, then 1 tab PO Day 7-9, then Half Tab PO Day 10-12 20 tablet 0  . methocarbamol (ROBAXIN) 500 MG tablet Take 2 tablets (1,000 mg total) by mouth 4 (four) times daily. 20 tablet 0  . traMADol (ULTRAM) 50 MG tablet Take 1 tablet (50 mg total) by mouth every 8 (eight) hours as needed. 15 tablet 0   No facility-administered medications prior to visit.     ROS Review of Systems  Constitutional: Negative for diaphoresis, fever and unexpected weight change.  HENT: Positive for dental problem.   Respiratory: Negative.   Cardiovascular: Negative.   Gastrointestinal: Negative.   Musculoskeletal: Positive for neck pain and neck stiffness.  Skin: Positive for rash.  Allergic/Immunologic: Negative for  immunocompromised state.  Neurological: Positive for numbness. Negative for weakness.  Hematological: Does not bruise/bleed easily.  Psychiatric/Behavioral: The patient is nervous/anxious.     Objective:  BP 138/80   Pulse 99   Ht 5\' 9"  (1.753 m)   Wt 162 lb 0.6 oz (73.5 kg)   SpO2 98%   BMI 23.93 kg/m   BP Readings from Last 3 Encounters:  04/16/18 138/80  04/04/18 121/64  01/05/18 (!) 127/91    Wt Readings from Last 3 Encounters:  04/16/18 162 lb 0.6 oz (73.5 kg)  04/04/18 162 lb (73.5 kg)  01/03/18 172 lb 13.5 oz (78.4 kg)    Physical Exam  Constitutional: She is oriented to person, place, and time. She appears well-developed and well-nourished. No distress.  HENT:  Head: Normocephalic and atraumatic.  Right Ear: External ear normal.  Left Ear: External ear normal.  Mouth/Throat: Oropharynx is clear and moist.  Eyes: Pupils are equal, round, and reactive to light. Conjunctivae and EOM are normal. Right eye exhibits no discharge. Left eye exhibits no discharge. No scleral icterus.  Neck: Neck supple. No JVD present. No tracheal deviation present. No thyromegaly present.  Pulmonary/Chest: Effort normal.  Musculoskeletal:       Cervical back: She exhibits spasm. She exhibits normal range of motion, no tenderness and no bony tenderness.  Lymphadenopathy:    She has no cervical adenopathy.  Neurological: She is alert and oriented to person, place, and time. She has normal strength.  Reflex  Scores:      Tricep reflexes are 1+ on the right side and 1+ on the left side.      Bicep reflexes are 1+ on the right side and 1+ on the left side.      Brachioradialis reflexes are 1+ on the right side and 1+ on the left side. positve tinell's ulnar groove on right.   Negative spurlings.   Skin: Skin is warm and dry. No rash noted. She is not diaphoretic.  Psychiatric: She has a normal mood and affect. Her behavior is normal.    Lab Results  Component Value Date   WBC 9.9  08/12/2016   HGB 14.2 08/12/2016   HCT 43.0 08/12/2016   PLT 370 08/12/2016   GLUCOSE 134 (H) 08/12/2016   CHOL 178 08/12/2016   TRIG 66 08/12/2016   HDL 50 (L) 08/12/2016   LDLCALC 115 (H) 08/12/2016   ALT 12 08/12/2016   AST 12 08/12/2016   NA 137 08/12/2016   K 4.1 08/12/2016   CL 104 08/12/2016   CREATININE 0.91 08/12/2016   BUN 19 08/12/2016   CO2 23 08/12/2016   TSH 0.87 08/12/2016    Dg Cervical Spine Complete  Result Date: 04/04/2018 CLINICAL DATA:  Right-sided neck pain, no known injury, initial encounter EXAM: CERVICAL SPINE - COMPLETE 4+ VIEW COMPARISON:  None. FINDINGS: Loss of the normal cervical lordosis is noted. Seven cervical segments are well visualized. Vertebral body height is well maintained. Disc space narrowing at C5-6 is noted with mild osteophytic changes. No neural foraminal changes are seen on the left. Mild neural foraminal changes are noted at C5-6 on the right. No soft tissue abnormality is noted. The odontoid is within normal limits. IMPRESSION: Degenerative changes as described. Electronically Signed   By: Inez Catalina M.D.   On: 04/04/2018 14:51    Assessment & Plan:   Anne Lewis was seen today for hospitalization follow-up.  Diagnoses and all orders for this visit:  Osteoarthritis of spine with radiculopathy, cervical region -     traMADol (ULTRAM) 50 MG tablet; Take 1 tablet (50 mg total) by mouth every 8 (eight) hours as needed. -     cyclobenzaprine (FLEXERIL) 10 MG tablet; Take 1 tablet (10 mg total) by mouth at bedtime. -     Ambulatory referral to Sports Medicine  Urticaria -     cetirizine (ZYRTEC) 10 MG tablet; Take 1 tablet (10 mg total) by mouth at bedtime. As needed for hives  Tooth pain   I have discontinued Anne Lewis's methocarbamol. I am also having her start on cyclobenzaprine and cetirizine. Additionally, I am having her maintain her predniSONE and traMADol.  Meds ordered this encounter  Medications  . traMADol  (ULTRAM) 50 MG tablet    Sig: Take 1 tablet (50 mg total) by mouth every 8 (eight) hours as needed.    Dispense:  15 tablet    Refill:  0  . cyclobenzaprine (FLEXERIL) 10 MG tablet    Sig: Take 1 tablet (10 mg total) by mouth at bedtime.    Dispense:  30 tablet    Refill:  0  . cetirizine (ZYRTEC) 10 MG tablet    Sig: Take 1 tablet (10 mg total) by mouth at bedtime. As needed for hives    Dispense:  30 tablet    Refill:  0   Encouraged her to continue the Aleve twice a day and augment with Tylenol.  We will fill the tramadol and use that as needed.  Flexeril will be taken at night for spasms and to help her sleep.  She will use Zyrtec for her rash of the return.  Advised her to see her dentist about her teeth.  Consultation with sports medicine.  She will follow-up at her convenience for ordered fasting labs.  Follow-up: Return if symptoms worsen or fail to improve, for Be sure to see your dentist..  Libby Maw, MD

## 2018-08-18 ENCOUNTER — Encounter: Payer: BLUE CROSS/BLUE SHIELD | Admitting: Gynecology

## 2018-08-18 DIAGNOSIS — Z0289 Encounter for other administrative examinations: Secondary | ICD-10-CM

## 2018-10-02 ENCOUNTER — Encounter (HOSPITAL_BASED_OUTPATIENT_CLINIC_OR_DEPARTMENT_OTHER): Payer: Self-pay

## 2018-10-02 ENCOUNTER — Emergency Department (HOSPITAL_BASED_OUTPATIENT_CLINIC_OR_DEPARTMENT_OTHER)
Admission: EM | Admit: 2018-10-02 | Discharge: 2018-10-02 | Disposition: A | Discharge status: Home / Self Care | Payer: BLUE CROSS/BLUE SHIELD | Attending: Emergency Medicine | Admitting: Emergency Medicine

## 2018-10-02 ENCOUNTER — Other Ambulatory Visit: Payer: Self-pay

## 2018-10-02 ENCOUNTER — Emergency Department (HOSPITAL_BASED_OUTPATIENT_CLINIC_OR_DEPARTMENT_OTHER): Payer: BLUE CROSS/BLUE SHIELD

## 2018-10-02 DIAGNOSIS — J111 Influenza due to unidentified influenza virus with other respiratory manifestations: Secondary | ICD-10-CM | POA: Diagnosis not present

## 2018-10-02 DIAGNOSIS — Z79899 Other long term (current) drug therapy: Secondary | ICD-10-CM | POA: Diagnosis not present

## 2018-10-02 DIAGNOSIS — F17210 Nicotine dependence, cigarettes, uncomplicated: Secondary | ICD-10-CM | POA: Diagnosis not present

## 2018-10-02 DIAGNOSIS — R69 Illness, unspecified: Secondary | ICD-10-CM

## 2018-10-02 DIAGNOSIS — R05 Cough: Secondary | ICD-10-CM | POA: Diagnosis present

## 2018-10-02 MED ORDER — GUAIFENESIN 100 MG/5ML PO SYRP: 100.0000 mg | ORAL_SOLUTION | ORAL | 0 refills | Status: DC | PRN

## 2018-10-02 MED ORDER — FLUTICASONE PROPIONATE 50 MCG/ACT NA SUSP: 1.0000 | Freq: Every day | NASAL | 2 refills | Status: DC

## 2018-10-02 MED ORDER — BENZONATATE 100 MG PO CAPS: 100.0000 mg | ORAL_CAPSULE | Freq: Three times a day (TID) | ORAL | 0 refills | Status: DC

## 2018-10-02 NOTE — ED Triage Notes (Signed)
C/o flu like sx x 5 days-NAD-steady gait

## 2018-10-02 NOTE — ED Provider Notes (Signed)
[facility] EMERGENCY DEPARTMENT Provider Note   CSN: [id number] Arrival date & time: 10/02/18  1542  History   Chief Complaint Chief Complaint  Patient presents with  . Cough    HPI Anne Lewis is a 53 y.o. female with no significant past medical history who presents for evaluation of flulike symptoms.  Patient states she has had productive cough, congestion, rhinorrhea, body aches and pains, chills x5 days.  Did not receive influenza vaccine.  Boyfriend is present in department for evaluation also has similar symptoms.  Patient is felt warm, has not taken her temperature.  Has tried OTC Mucinex, OTC cough medicine without relief of symptoms.  Patient states cough productive of yellow sputum.  Denies headache, sore throat, neck pain, neck stiffness, chest pain, shortness of breath, diarrhea, dysuria, nausea or vomiting.  Denies additional aggravating or alleviating factors.  Denies travel or exposure to coronavirus positive patients.  History obtained from patient.  No interpreter was used.     HPI  Past Medical History:  Diagnosis Date  . BRCA negative 11/2013  . H pylori ulcer   . Ulcer of gastric fundus     Patient Active Problem List   Diagnosis Date Noted  . 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 Urticaria 04/16/2018  . Tooth pain 04/16/2018  . Acute non-recurrent maxillary sinusitis 08/19/2017  . Lateral epicondylitis of left elbow 08/19/2017  . Cough 08/19/2017  . Healthcare maintenance 08/19/2017  . Family history of malignant neoplasm of breast 11/08/2013  . Family history of malignant neoplasm of gastrointestinal tract 11/08/2013  . Family history of malignant neoplasm of ovary 11/08/2013  . RUQ PAIN 08/08/2010  . DYSPEPSIA&OTHER [facility] DISORDERS FUNCTION STOMACH 08/07/2010  . ABDOMINAL PAIN, EPIGASTRIC 08/07/2010  . OTHER CONSTIPATION 12/05/2009    Past Surgical History:  Procedure Laterality Date  . CESAREAN  SECTION  [id number]  . ENDOMETRIAL CRYOABLATION  12.19.2012   HER OPTION - office performed  . TONSILLECTOMY  1987     OB History    Gravida  3   Para  3   Term      Preterm      AB      Living  3     SAB      TAB      Ectopic      Multiple      Live Births               Home Medications    Prior to Admission medications   Medication Sig Start Date End Date Taking? Authorizing Provider  benzonatate (TESSALON) 100 MG capsule Take 1 capsule (100 mg total) by mouth every 8 (eight) hours. 10/02/18   Henderly, Britni A, PA-C  cetirizine (ZYRTEC) 10 MG tablet Take 1 tablet (10 mg total) by mouth at bedtime. As needed for hives 04/16/18   Libby Maw, MD  cyclobenzaprine (FLEXERIL) 10 MG tablet Take 1 tablet (10 mg total) by mouth at bedtime. 04/16/18   Libby Maw, MD  fluticasone (FLONASE) 50 MCG/ACT nasal spray Place 1 spray into both nostrils daily. 10/02/18   Henderly, Britni A, PA-C  guaifenesin (ROBITUSSIN) 100 MG/5ML syrup Take 5-10 mLs (100-200 mg total) by mouth every 4 (four) hours as needed for cough. 10/02/18   Henderly, Britni A, PA-C  predniSONE (DELTASONE) 20 MG tablet 3 Tabs PO Days 1-3, then 2 tabs PO Days 4-6, then 1 tab PO Day 7-9, then Half Tab PO Day 10-12  04/04/18   Carlisle Cater, PA-C  traMADol (ULTRAM) 50 MG tablet Take 1 tablet (50 mg total) by mouth every 8 (eight) hours as needed. 04/16/18   Libby Maw, MD    Family History Family History  Problem Relation Age of Onset  . Cancer Cousin 39       ovarian  . Cancer Cousin 18       breast  . Hypertension Mother   . Cancer Mother 63       intestational and ovarian  . Diverticulitis Mother   . Diabetes Father   . Hypertension Father   . Heart disease Father   . Cancer Sister 13       ovarian and intestional  . Breast cancer Maternal Aunt 60  . Cancer Brother 24       GI cancer  . Cancer Paternal Aunt 22       stomach  . Cancer Maternal Grandmother 57        ovarian and pancreatic  . Cancer Maternal Grandfather 82       pancreatic    Social History Social History   Tobacco Use  . Smoking status: Current Some Day Smoker    Types: Cigarettes  . Smokeless tobacco: Never Used  Substance Use Topics  . Alcohol use: Yes    Comment: occ  . Drug use: No     Allergies   Aspirin; Augmentin [amoxicillin-pot clavulanate]; and Oxycodone   Review of Systems Review of Systems  Constitutional:       Subjective fever.  HENT: Positive for congestion, postnasal drip and rhinorrhea. Negative for ear discharge, ear pain, sinus pressure, sinus pain, sneezing, sore throat, tinnitus, trouble swallowing and voice change.   Eyes: Negative.   Respiratory: Positive for cough. Negative for apnea, choking, chest tightness, shortness of breath, wheezing and stridor.   Cardiovascular: Negative.   Gastrointestinal: Negative.   Genitourinary: Negative.   Musculoskeletal: Negative.   Skin: Negative.   Neurological: Negative.   All other systems reviewed and are negative.    Physical Exam Updated Vital Signs BP (!) 154/81 (BP Location: Left Arm)   Pulse (!) 102   Temp 98.6 F (37 C) (Oral)   Resp 16   Ht '5\' 9"'  (1.753 m)   Wt 79.4 kg   SpO2 99%   BMI 25.84 kg/m   Physical Exam Vitals signs and nursing note reviewed.  Constitutional:      General: She is not in acute distress.    Appearance: She is well-developed. She is not ill-appearing, toxic-appearing or diaphoretic.  HENT:     Head: Normocephalic and atraumatic.     Right Ear: Tympanic membrane, ear canal and external ear normal. There is no impacted cerumen.     Left Ear: Tympanic membrane, ear canal and external ear normal. There is no impacted cerumen.     Nose:     Comments: Clear rhinorrhea and congestion to bilateral nares.    Mouth/Throat:     Comments: Mucous membranes moist.  Posterior oropharynx clear.  Tonsils without edema or exudate.  Uvula midline without deviation.  No  drooling, dysphasia or trismus.  No evidence of PTA or RPA. Eyes:     Pupils: Pupils are equal, round, and reactive to light.  Neck:     Musculoskeletal: Normal range of motion.     Comments: No neck stiffness or neck rigidity.  No meningismus. Cardiovascular:     Rate and Rhythm: Tachycardia present.  Pulmonary:  Effort: No respiratory distress.     Comments: Clear to auscultation bilateral without wheeze, rhonchi or rales.  No accessory muscle usage.  Able to speak in full sentences without difficulty. Abdominal:     General: There is no distension.     Comments: Soft, nontender without rebound or guarding.  Musculoskeletal: Normal range of motion.     Comments: Moves all 4 extremities at difficulty.  Ambulatory in department that difficulty.  Skin:    General: Skin is warm and dry.     Comments: Brisk capillary refill.  No rashes or lesions.  Neurological:     Mental Status: She is alert.     Comments: No facial droop.  Cranial nerves II through XII grossly intact.  Fluent speech.    ED Treatments / Results  Labs (all labs ordered are listed, but only abnormal results are displayed) Labs Reviewed - No data to display  EKG None  Radiology Dg Chest 2 View  Result Date: 10/02/2018 CLINICAL DATA:  Cough EXAM: CHEST - 2 VIEW COMPARISON:  August 19, 2017 FINDINGS: There is no appreciable edema or consolidation. The heart size and pulmonary vascularity are normal. No adenopathy. No bone lesions. IMPRESSION: No edema or consolidation. Electronically Signed   By: Lowella Grip III M.D.   On: 10/02/2018 17:55    Procedures Procedures (including critical care time)  Medications Ordered in ED Medications - No data to display   Initial Impression / Assessment and Plan / ED Course  I have reviewed the triage vital signs and the nursing notes.  Pertinent labs & imaging results that were available during my care of the patient were reviewed by me and considered in my  medical decision making (see chart for details).  68 old female appears otherwise well presents for evaluation of flulike symptoms.  Symptom onset 5 days ago.  Afebrile, nonseptic, non-ill-appearing.  Lungs clear to auscultation bilaterally without wheeze, rhonchi or rales.  No tachypnea or hypoxia, low suspicion for pneumonia.  Chest x-ray negative for infiltrates, pneumothorax, pulmonary edema, cardiomegally  Oropharynx clear.  Mucous membranes moist.  Uvula midline without deviation.  Tonsils without erythema or exudate.  No drooling, dysphasia or trismus.  Low suspicion for pharyngitis.  Ears without evidence of otitis.  No neck stiffness or neck rigidity, no meningismus, suspicion for meningitis.  Patient able to tolerate p.o. intake in department without difficulty.  Likely with flulike illness.  She is outside treatment of her Tamiflu.  Will DC home with symptomatic management.  Patient to follow-up with PCP if continues to have symptoms in 3 days.  Patient hemodynamically stable and appropriate for DC at this time.  Discussed return precautions.  Patient voiced understanding and is agreeable to follow-up.     Final Clinical Impressions(s) / ED Diagnoses   Final diagnoses:  Influenza-like illness    ED Discharge Orders         Ordered    benzonatate (TESSALON) 100 MG capsule  Every 8 hours     10/02/18 1804    guaifenesin (ROBITUSSIN) 100 MG/5ML syrup  Every 4 hours PRN     10/02/18 1804    fluticasone (FLONASE) 50 MCG/ACT nasal spray  Daily     10/02/18 1804           Henderly, Britni A, PA-C 10/02/18 1808    Malvin Johns, MD 10/02/18 2239

## 2018-10-02 NOTE — Discharge Instructions (Signed)
Evaluated today for flulike symptoms.  You are outside treatment window for Tamiflu.  I have prescribed you symptomatic management.  Also take Tylenol and ibuprofen as needed for body aches and pains.  Follow-up with PCP for reevaluation for continued symptoms about 3 days.   Return to the ED for any worsening symptoms.

## 2018-10-06 ENCOUNTER — Encounter: Payer: Self-pay | Admitting: Family Medicine

## 2018-10-06 ENCOUNTER — Ambulatory Visit: Payer: BLUE CROSS/BLUE SHIELD | Admitting: Nurse Practitioner

## 2018-10-06 ENCOUNTER — Other Ambulatory Visit: Payer: Self-pay

## 2018-10-06 ENCOUNTER — Ambulatory Visit: Payer: BLUE CROSS/BLUE SHIELD | Admitting: Family Medicine

## 2018-10-06 VITALS — BP 126/76 | HR 113 | Temp 98.0°F | Ht 69.0 in

## 2018-10-06 DIAGNOSIS — J4541 Moderate persistent asthma with (acute) exacerbation: Secondary | ICD-10-CM | POA: Diagnosis not present

## 2018-10-06 DIAGNOSIS — J019 Acute sinusitis, unspecified: Secondary | ICD-10-CM | POA: Diagnosis not present

## 2018-10-06 DIAGNOSIS — J45909 Unspecified asthma, uncomplicated: Secondary | ICD-10-CM | POA: Insufficient documentation

## 2018-10-06 MED ORDER — METHYLPREDNISOLONE SODIUM SUCC 125 MG IJ SOLR
125.0000 mg | Freq: Once | INTRAMUSCULAR | Status: AC
  Administered 2018-10-06: 125 mg via INTRAMUSCULAR

## 2018-10-06 MED ORDER — PROMETHAZINE-DM 6.25-15 MG/5ML PO SYRP: 5.0000 mL | ORAL_SOLUTION | Freq: Four times a day (QID) | ORAL | 0 refills | Status: DC | PRN

## 2018-10-06 MED ORDER — CLARITHROMYCIN ER 500 MG PO TB24: 1000.0000 mg | ORAL_TABLET | Freq: Every day | ORAL | 0 refills | Status: AC

## 2018-10-06 MED ORDER — PREDNISONE 10 MG PO TABS: 10.0000 mg | ORAL_TABLET | Freq: Two times a day (BID) | ORAL | 0 refills | Status: AC

## 2018-10-06 NOTE — Progress Notes (Signed)
Established Patient Office Visit  Subjective:  Patient ID: Anne Lewis, female    DOB: August 30, 1965  Age: 53 y.o. MRN: [id number]  CC:  Chief Complaint  Patient presents with  . Generalized Body Aches  . Headache    HPI Anne Lewis presents for evaluation and treatment of a severe cough associated with tightness and wheezing in the chest.  Cough is mostly nonproductive.  There is no fever or chills.  Patient has no history of asthma.  She has wheezed with illness before.  She does have postnasal drip.  She rarely smokes a cigarette.  He does have facial pressure with purulent rhinorrhea.  Illness has been present for 7 to 10 days at this point.  Was seen in the ER and treated for acute viral illness back on the 13th and has not improved.  She has tried Orthoptist.  Chest x-ray obtained in the emergency room was negative  Past Medical History:  Diagnosis Date  . BRCA negative 11/2013  . H pylori ulcer   . Ulcer of gastric fundus     Past Surgical History:  Procedure Laterality Date  . CESAREAN SECTION  [id number]  . ENDOMETRIAL CRYOABLATION  12.19.2012   HER OPTION - office performed  . TONSILLECTOMY  1987    Family History  Problem Relation Age of Onset  . Cancer Cousin 39       ovarian  . Cancer Cousin 103       breast  . Hypertension Mother   . Cancer Mother 82       intestational and ovarian  . Diverticulitis Mother   . Diabetes Father   . Hypertension Father   . Heart disease Father   . Cancer Sister 23       ovarian and intestional  . Breast cancer Maternal Aunt 60  . Cancer Brother 24       GI cancer  . Cancer Paternal Aunt 4       stomach  . Cancer Maternal Grandmother 70       ovarian and pancreatic  . Cancer Maternal Grandfather 82       pancreatic    Social History   Socioeconomic History  . Marital status: Divorced    Spouse name: Not on file  . Number of children: Not on file  . Years of education: Not on file   . Highest education level: Not on file  Occupational History  . Not on file  Social Needs  . Financial resource strain: Not on file  . Food insecurity:    Worry: Not on file    Inability: Not on file  . Transportation needs:    Medical: Not on file    Non-medical: Not on file  Tobacco Use  . Smoking status: Current Some Day Smoker    Types: Cigarettes  . Smokeless tobacco: Never Used  Substance and Sexual Activity  . Alcohol use: Yes    Comment: occ  . Drug use: No  . Sexual activity: Not on file  Lifestyle  . Physical activity:    Days per week: Not on file    Minutes per session: Not on file  . Stress: Not on file  Relationships  . Social connections:    Talks on phone: Not on file    Gets together: Not on file    Attends religious service: Not on file    Active member of club or organization: Not on file    Attends  meetings of clubs or organizations: Not on file    Relationship status: Not on file  . Intimate partner violence:    Fear of current or ex partner: Not on file    Emotionally abused: Not on file    Physically abused: Not on file    Forced sexual activity: Not on file  Other Topics Concern  . Not on file  Social History Narrative  . Not on file    Outpatient Medications Prior to Visit  Medication Sig Dispense Refill  . benzonatate (TESSALON) 100 MG capsule Take 1 capsule (100 mg total) by mouth every 8 (eight) hours. 21 capsule 0  . cetirizine (ZYRTEC) 10 MG tablet Take 1 tablet (10 mg total) by mouth at bedtime. As needed for hives 30 tablet 0  . cyclobenzaprine (FLEXERIL) 10 MG tablet Take 1 tablet (10 mg total) by mouth at bedtime. 30 tablet 0  . fluticasone (FLONASE) 50 MCG/ACT nasal spray Place 1 spray into both nostrils daily. 11.1 g 2  . guaifenesin (ROBITUSSIN) 100 MG/5ML syrup Take 5-10 mLs (100-200 mg total) by mouth every 4 (four) hours as needed for cough. 60 mL 0  . traMADol (ULTRAM) 50 MG tablet Take 1 tablet (50 mg total) by mouth every  8 (eight) hours as needed. 15 tablet 0  . predniSONE (DELTASONE) 20 MG tablet 3 Tabs PO Days 1-3, then 2 tabs PO Days 4-6, then 1 tab PO Day 7-9, then Half Tab PO Day 10-12 20 tablet 0   No facility-administered medications prior to visit.     Allergies  Allergen Reactions  . Aspirin Swelling and Other (See Comments)    Bleeding  . Augmentin [Amoxicillin-Pot Clavulanate] Swelling  . Oxycodone Nausea And Vomiting    ROS Review of Systems  Constitutional: Negative for diaphoresis, fatigue, fever and unexpected weight change.  HENT: Positive for congestion, hearing loss, postnasal drip, rhinorrhea, sinus pressure and sinus pain. Negative for dental problem.   Eyes: Negative for photophobia and visual disturbance.  Respiratory: Positive for cough and chest tightness. Negative for shortness of breath.   Cardiovascular: Negative for chest pain, palpitations and leg swelling.  Gastrointestinal: Positive for nausea. Negative for vomiting.  Endocrine: Negative for polyphagia and polyuria.  Genitourinary: Negative.   Musculoskeletal: Negative for arthralgias and myalgias.  Skin: Negative for pallor.  Allergic/Immunologic: Negative for immunocompromised state.  Neurological: Positive for headaches. Negative for numbness.  Hematological: Does not bruise/bleed easily.  Psychiatric/Behavioral: Negative.       Objective:    Physical Exam  Constitutional: She is oriented to person, place, and time. She appears well-developed and well-nourished. No distress.  HENT:  Head: Normocephalic and atraumatic.  Right Ear: External ear normal.  Left Ear: External ear normal.  Mouth/Throat: Oropharynx is clear and moist. No oropharyngeal exudate.  Eyes: Pupils are equal, round, and reactive to light. Conjunctivae are normal. Right eye exhibits no discharge. Left eye exhibits no discharge. No scleral icterus.  Neck: Neck supple. No JVD present. No tracheal deviation present. No thyromegaly present.   Cardiovascular: Normal rate, regular rhythm and normal heart sounds.  Pulmonary/Chest: Effort normal and breath sounds normal. No respiratory distress. She has no wheezes. She has no rales.  Abdominal: Bowel sounds are normal.  Lymphadenopathy:    She has no cervical adenopathy.  Neurological: She is alert and oriented to person, place, and time.  Skin: Skin is warm and dry. She is not diaphoretic.  Psychiatric: She has a normal mood and affect. Her behavior is  normal.    BP 126/76   Pulse (!) 113   Temp 98 F (36.7 C) (Oral)   Ht '5\' 9"'  (1.753 m)   SpO2 97%   BMI 25.84 kg/m  Wt Readings from Last 3 Encounters:  10/02/18 175 lb (79.4 kg)  04/16/18 162 lb 0.6 oz (73.5 kg)  04/04/18 162 lb (73.5 kg)   BP Readings from Last 3 Encounters:  10/06/18 126/76  10/02/18 (!) 154/81  04/16/18 138/80   Guideline developer:  UpToDate (see UpToDate for funding source) Date Released: June 2014  Health Maintenance Due  Topic Date Due  . HIV Screening  11/16/1980  . MAMMOGRAM  11/17/2015  . PAP SMEAR-Modifier  10/20/2016  . INFLUENZA VACCINE  02/19/2018    There are no preventive care reminders to display for this patient.  Lab Results  Component Value Date   TSH 0.87 08/12/2016   Lab Results  Component Value Date   WBC 9.9 08/12/2016   HGB 14.2 08/12/2016   HCT 43.0 08/12/2016   MCV 91.3 08/12/2016   PLT 370 08/12/2016   Lab Results  Component Value Date   NA 137 08/12/2016   K 4.1 08/12/2016   CO2 23 08/12/2016   GLUCOSE 134 (H) 08/12/2016   BUN 19 08/12/2016   CREATININE 0.91 08/12/2016   BILITOT 0.7 08/12/2016   ALKPHOS 45 08/12/2016   AST 12 08/12/2016   ALT 12 08/12/2016   PROT 6.9 08/12/2016   ALBUMIN 4.4 08/12/2016   CALCIUM 9.6 08/12/2016   GFR 83.75 08/07/2010   Lab Results  Component Value Date   CHOL 178 08/12/2016   Lab Results  Component Value Date   HDL 50 (L) 08/12/2016   Lab Results  Component Value Date   LDLCALC 115 (H) 08/12/2016    Lab Results  Component Value Date   TRIG 66 08/12/2016   Lab Results  Component Value Date   CHOLHDL 3.6 08/12/2016   No results found for: HGBA1C    Assessment & Plan:   Problem List Items Addressed This Visit      Respiratory   Acute non-recurrent sinusitis - Primary   Relevant Medications   predniSONE (DELTASONE) 10 MG tablet   clarithromycin (BIAXIN XL) 500 MG 24 hr tablet   promethazine-dextromethorphan (PROMETHAZINE-DM) 6.25-15 MG/5ML syrup   Reactive airway disease   Relevant Medications   predniSONE (DELTASONE) 10 MG tablet   promethazine-dextromethorphan (PROMETHAZINE-DM) 6.25-15 MG/5ML syrup      Meds ordered this encounter  Medications  . predniSONE (DELTASONE) 10 MG tablet    Sig: Take 1 tablet (10 mg total) by mouth 2 (two) times daily with a meal for 5 days.    Dispense:  10 tablet    Refill:  0  . clarithromycin (BIAXIN XL) 500 MG 24 hr tablet    Sig: Take 2 tablets (1,000 mg total) by mouth daily for 10 days.    Dispense:  20 tablet    Refill:  0  . promethazine-dextromethorphan (PROMETHAZINE-DM) 6.25-15 MG/5ML syrup    Sig: Take 5 mLs by mouth 4 (four) times daily as needed for cough.    Dispense:  118 mL    Refill:  0    Follow-up: Return in about 1 week (around 10/13/2018), or if symptoms worsen or fail to improve.

## 2018-10-07 ENCOUNTER — Telehealth: Payer: Self-pay

## 2018-10-07 DIAGNOSIS — R059 Cough, unspecified: Secondary | ICD-10-CM

## 2018-10-07 DIAGNOSIS — R05 Cough: Secondary | ICD-10-CM

## 2018-10-07 NOTE — Telephone Encounter (Signed)
Copied from [place] [id number]. Topic: General - Other >> Oct 07, 2018  2:55 PM Oneta Rack wrote: [facility] [address], [place] [phone] (Phone) [phone] (Fax)   Pharmacist states promethazine-dextromethorphan (PROMETHAZINE-DM) 6.25-15 MG/5ML is on back order requesting alternate, please advise

## 2018-10-08 MED ORDER — BENZONATATE 200 MG PO CAPS: 200.0000 mg | ORAL_CAPSULE | Freq: Two times a day (BID) | ORAL | 0 refills | Status: DC | PRN

## 2018-10-08 MED ORDER — DEXTROMETHORPHAN HBR 15 MG/5ML PO SYRP: 10.0000 mL | ORAL_SOLUTION | Freq: Four times a day (QID) | ORAL | 0 refills | Status: DC | PRN

## 2018-10-08 NOTE — Telephone Encounter (Signed)
New rx's sent to pharmacy. 

## 2019-04-14 ENCOUNTER — Encounter: Payer: Self-pay | Admitting: Gynecology

## 2019-04-22 ENCOUNTER — Other Ambulatory Visit: Payer: Self-pay

## 2019-04-22 DIAGNOSIS — Z20822 Contact with and (suspected) exposure to covid-19: Secondary | ICD-10-CM

## 2019-04-23 LAB — NOVEL CORONAVIRUS, NAA: SARS-CoV-2, NAA: NOT DETECTED

## 2019-08-30 ENCOUNTER — Encounter: Payer: Self-pay | Admitting: Family Medicine

## 2019-08-30 ENCOUNTER — Ambulatory Visit (INDEPENDENT_AMBULATORY_CARE_PROVIDER_SITE_OTHER): Payer: 59 | Admitting: Family Medicine

## 2019-08-30 ENCOUNTER — Other Ambulatory Visit: Payer: Self-pay

## 2019-08-30 VITALS — BP 118/78 | HR 98 | Temp 98.0°F | Ht 69.0 in | Wt 198.0 lb

## 2019-08-30 DIAGNOSIS — L509 Urticaria, unspecified: Secondary | ICD-10-CM

## 2019-08-30 DIAGNOSIS — Z0000 Encounter for general adult medical examination without abnormal findings: Secondary | ICD-10-CM | POA: Diagnosis not present

## 2019-08-30 DIAGNOSIS — F329 Major depressive disorder, single episode, unspecified: Secondary | ICD-10-CM

## 2019-08-30 DIAGNOSIS — F32A Depression, unspecified: Secondary | ICD-10-CM

## 2019-08-30 DIAGNOSIS — R635 Abnormal weight gain: Secondary | ICD-10-CM | POA: Insufficient documentation

## 2019-08-30 DIAGNOSIS — F341 Dysthymic disorder: Secondary | ICD-10-CM | POA: Insufficient documentation

## 2019-08-30 DIAGNOSIS — R202 Paresthesia of skin: Secondary | ICD-10-CM

## 2019-08-30 DIAGNOSIS — E559 Vitamin D deficiency, unspecified: Secondary | ICD-10-CM

## 2019-08-30 MED ORDER — HYDROXYZINE HCL 25 MG PO TABS: 25.0000 mg | ORAL_TABLET | Freq: Every evening | ORAL | 1 refills | Status: DC | PRN

## 2019-08-30 MED ORDER — DULOXETINE HCL 30 MG PO CPEP: 30.0000 mg | ORAL_CAPSULE | Freq: Every day | ORAL | 1 refills | Status: DC

## 2019-08-30 NOTE — Progress Notes (Addendum)
Established Patient Office Visit  Subjective:  Patient ID: Anne Lewis, female    DOB: 01-23-1966  Age: 54 y.o. MRN: [id number]  CC:  Chief Complaint  Patient presents with  . Rash    c/o rashes on arms and legs, also states that she have pains all over her body that come and go feels like pins and needles. 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  She has been treating this with Benadryl.  More recently she has been experiencing painful paresthesias about her and entire body that feel like an pricks.  These occur randomly throughout her body.  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  Things are okay at home with husband.  Children are grown house.  She continues to smoke some.  Enjoys a glass of wine on occasion.  She is not been exercising much due to the painful nature of the paresthesias.  She has gained 23 pounds since her last clinic visit.  Past Medical History:  Diagnosis Date  . BRCA negative 11/2013  . H pylori ulcer   . Ulcer of gastric fundus     Past Surgical History:  Procedure Laterality Date  . CESAREAN SECTION  [id number]  . ENDOMETRIAL CRYOABLATION  12.19.2012   HER OPTION - office performed  . TONSILLECTOMY  1987    Family History  Problem Relation Age of Onset  . Cancer Cousin 39       ovarian  . Cancer Cousin 46       breast  . Hypertension Mother   . Cancer Mother 58       intestational and ovarian  . Diverticulitis Mother   . Diabetes Father   . Hypertension Father   . Heart disease Father   . Cancer Sister 24       ovarian and intestional  . Breast cancer Maternal Aunt 60  . Cancer Brother 24       GI cancer  . Cancer Paternal Aunt 68       stomach  . Cancer Maternal Grandmother 12       ovarian and pancreatic  . Cancer Maternal Grandfather 82       pancreatic    Social History   Socioeconomic History  .  Marital status: Divorced    Spouse name: Not on file  . Number of children: Not on file  . Years of education: Not on file  . Highest education level: Not on file  Occupational History  . Not on file  Tobacco Use  . Smoking status: Current Some Day Smoker    Types: Cigarettes  . Smokeless tobacco: Never Used  . Tobacco comment: occ cigarette when she goes our for drinks. otherwise no.   Substance and Sexual Activity  . Alcohol use: Yes    Comment: occ  . Drug use: No  . Sexual activity: Not on file  Other Topics Concern  . Not on file  Social History Narrative  . Not on file   Social Determinants of Health   Financial Resource Strain:   . Difficulty of Paying Living Expenses: Not on file  Food Insecurity:   . Worried About Charity fundraiser in the Last Year: Not on file  . Ran Out of Food in the Last Year: Not on file  Transportation Needs:   . Lack of Transportation (Medical):  Not on file  . Lack of Transportation (Non-Medical): Not on file  Physical Activity:   . Days of Exercise per Week: Not on file  . Minutes of Exercise per Session: Not on file  Stress:   . Feeling of Stress : Not on file  Social Connections:   . Frequency of Communication with Friends and Family: Not on file  . Frequency of Social Gatherings with Friends and Family: Not on file  . Attends Religious Services: Not on file  . Active Member of Clubs or Organizations: Not on file  . Attends Archivist Meetings: Not on file  . Marital Status: Not on file  Intimate Partner Violence:   . Fear of Current or Ex-Partner: Not on file  . Emotionally Abused: Not on file  . Physically Abused: Not on file  . Sexually Abused: Not on file    Outpatient Medications Prior to Visit  Medication Sig Dispense Refill  . benzonatate (TESSALON) 200 MG capsule Take 1 capsule (200 mg total) by mouth 2 (two) times daily as needed for cough. (Patient not taking: Reported on 08/30/2019) 20 capsule 0  .  cetirizine (ZYRTEC) 10 MG tablet Take 1 tablet (10 mg total) by mouth at bedtime. As needed for hives (Patient not taking: Reported on 08/30/2019) 30 tablet 0  . fluticasone (FLONASE) 50 MCG/ACT nasal spray Place 1 spray into both nostrils daily. (Patient not taking: Reported on 08/30/2019) 11.1 g 2  . guaifenesin (ROBITUSSIN) 100 MG/5ML syrup Take 5-10 mLs (100-200 mg total) by mouth every 4 (four) hours as needed for cough. (Patient not taking: Reported on 08/30/2019) 60 mL 0  . cyclobenzaprine (FLEXERIL) 10 MG tablet Take 1 tablet (10 mg total) by mouth at bedtime. (Patient not taking: Reported on 08/30/2019) 30 tablet 0  . dextromethorphan 15 MG/5ML syrup Take 10 mLs (30 mg total) by mouth 4 (four) times daily as needed for cough. (Patient not taking: Reported on 08/30/2019) 120 mL 0  . promethazine-dextromethorphan (PROMETHAZINE-DM) 6.25-15 MG/5ML syrup Take 5 mLs by mouth 4 (four) times daily as needed for cough. (Patient not taking: Reported on 08/30/2019) 118 mL 0  . traMADol (ULTRAM) 50 MG tablet Take 1 tablet (50 mg total) by mouth every 8 (eight) hours as needed. (Patient not taking: Reported on 08/30/2019) 15 tablet 0   No facility-administered medications prior to visit.    Allergies  Allergen Reactions  . Aspirin Swelling and Other (See Comments)    Bleeding  . Augmentin [Amoxicillin-Pot Clavulanate] Swelling  . Oxycodone Nausea And Vomiting    ROS Review of Systems  Constitutional: Negative for diaphoresis, fatigue, fever and unexpected weight change.  HENT: Negative.   Eyes: Positive for visual disturbance. Negative for photophobia.  Respiratory: Negative.   Cardiovascular: Negative.   Gastrointestinal: Negative.   Endocrine: Negative for polyphagia and polyuria.  Genitourinary: Negative.   Musculoskeletal: Negative for gait problem and joint swelling.  Skin: Positive for rash.  Neurological: Negative for weakness and numbness.  Hematological: Negative.    Depression screen PHQ  2/9 08/30/2019  Decreased Interest 1  Down, Depressed, Hopeless 2  PHQ - 2 Score 3  Altered sleeping 3  Tired, decreased energy 1  Change in appetite 1  Feeling bad or failure about yourself  1  Trouble concentrating 0  Moving slowly or fidgety/restless 0  Suicidal thoughts 0  PHQ-9 Score 9  Difficult doing work/chores Not difficult at all      Objective:    Physical Exam  Constitutional:  She is oriented to person, place, and time. She appears well-developed and well-nourished. No distress.  HENT:  Head: Normocephalic and atraumatic.  Right Ear: External ear normal.  Left Ear: External ear normal.  Eyes: Conjunctivae are normal. Right eye exhibits no discharge. Left eye exhibits no discharge. No scleral icterus.  Neck: No JVD present. No tracheal deviation present.  Cardiovascular: Normal rate, regular rhythm and normal heart sounds.  Pulmonary/Chest: Effort normal and breath sounds normal. No stridor.  Neurological: She is alert and oriented to person, place, and time.  Skin: Skin is warm and dry. No rash noted. She is not diaphoretic.  Psychiatric: She has a normal mood and affect. Her behavior is normal.    BP 118/78   Pulse 98   Temp 98 F (36.7 C) (Tympanic)   Ht _0  (1.753 m)   Wt 198 lb (89.8 kg)   SpO2 98%   BMI 29.24 kg/m  Wt Readings from Last 3 Encounters:  08/30/19 198 lb (89.8 kg)  10/02/18 175 lb (79.4 kg)  04/16/18 162 lb 0.6 oz (73.5 kg)     Health Maintenance Due  Topic Date Due  . HIV Screening  11/16/1980  . MAMMOGRAM  11/17/2015  . PAP SMEAR-Modifier  10/20/2016  . INFLUENZA VACCINE  02/20/2019    There are no preventive care reminders to display for this patient.  Lab Results  Component Value Date   TSH 1.06 08/30/2019   Lab Results  Component Value Date   WBC 9.4 08/30/2019   HGB 14.8 08/30/2019   HCT 44.8 08/30/2019   MCV 91.2 08/30/2019   PLT 395.0 08/30/2019   Lab Results  Component Value Date   NA 136 08/30/2019   K  4.2 08/30/2019   CO2 26 08/30/2019   GLUCOSE 86 08/30/2019   BUN 18 08/30/2019   CREATININE 0.90 08/30/2019   BILITOT 0.3 08/30/2019   ALKPHOS 83 08/30/2019   AST 21 08/30/2019   ALT 20 08/30/2019   PROT 7.9 08/30/2019   ALBUMIN 4.5 08/30/2019   CALCIUM 10.0 08/30/2019   GFR 65.30 08/30/2019   Lab Results  Component Value Date   CHOL 218 (H) 08/30/2019   Lab Results  Component Value Date   HDL 34.70 (L) 08/30/2019   Lab Results  Component Value Date   LDLCALC 115 (H) 08/12/2016   Lab Results  Component Value Date   TRIG 360.0 (H) 08/30/2019   Lab Results  Component Value Date   CHOLHDL 6 08/30/2019   Lab Results  Component Value Date   HGBA1C 5.9 08/30/2019      Assessment & Plan:   Problem List Items Addressed This Visit      Musculoskeletal and Integument   Urticaria - Primary   Relevant Medications   hydrOXYzine (ATARAX/VISTARIL) 25 MG tablet     Other   Healthcare maintenance   Relevant Orders   Comprehensive metabolic panel (Completed)   Lipid panel (Completed)   Urinalysis, Routine w reflex microscopic   VITAMIN D 25 Hydroxy (Vit-D Deficiency, Fractures) (Completed)   Paresthesias   Relevant Medications   DULoxetine (CYMBALTA) 30 MG capsule   hydrOXYzine (ATARAX/VISTARIL) 25 MG tablet   Other Relevant Orders   Vitamin B12 (Completed)   CBC (Completed)   TSH (Completed)   Weight gain   Relevant Orders   Hemoglobin A1c (Completed)   TSH (Completed)   Depression   Relevant Medications   DULoxetine (CYMBALTA) 30 MG capsule   hydrOXYzine (ATARAX/VISTARIL) 25 MG tablet  Other Visit Diagnoses    Vitamin D deficiency       Relevant Medications   Vitamin D, Ergocalciferol, (DRISDOL) 1.25 MG (50000 UNIT) CAPS capsule      Meds ordered this encounter  Medications  . DULoxetine (CYMBALTA) 30 MG capsule    Sig: Take 1 capsule (30 mg total) by mouth daily. In the morning.    Dispense:  30 capsule    Refill:  1  . hydrOXYzine  (ATARAX/VISTARIL) 25 MG tablet    Sig: Take 1 tablet (25 mg total) by mouth at bedtime as needed.    Dispense:  30 tablet    Refill:  1  . Vitamin D, Ergocalciferol, (DRISDOL) 1.25 MG (50000 UNIT) CAPS capsule    Sig: Take 1 capsule (50,000 Units total) by mouth every 7 (seven) days.    Dispense:  5 capsule    Refill:  5    Follow-up: Return in about 1 month (around 09/27/2019).  Agrees to give Cymbalta a try.  Will use Atarax at night to help sleep.  Blood work as above.  Follow-up 1 month.  Libby Maw, MD

## 2019-08-31 LAB — COMPREHENSIVE METABOLIC PANEL
ALT: 20 U/L (ref 0–35)
AST: 21 U/L (ref 0–37)
Albumin: 4.5 g/dL (ref 3.5–5.2)
Alkaline Phosphatase: 83 U/L (ref 39–117)
BUN: 18 mg/dL (ref 6–23)
CO2: 26 mEq/L (ref 19–32)
Calcium: 10 mg/dL (ref 8.4–10.5)
Chloride: 102 mEq/L (ref 96–112)
Creatinine, Ser: 0.9 mg/dL (ref 0.40–1.20)
GFR: 65.3 mL/min (ref >60.00)
Glucose, Bld: 86 mg/dL (ref 70–99)
Potassium: 4.2 mEq/L (ref 3.5–5.1)
Sodium: 136 mEq/L (ref 135–145)
Total Bilirubin: 0.3 mg/dL (ref 0.2–1.2)
Total Protein: 7.9 g/dL (ref 6.0–8.3)

## 2019-08-31 LAB — URINALYSIS, ROUTINE W REFLEX MICROSCOPIC
Bilirubin Urine: NEGATIVE
Hgb urine dipstick: NEGATIVE
Ketones, ur: NEGATIVE
Leukocytes,Ua: NEGATIVE
Nitrite: NEGATIVE
RBC / HPF: NONE SEEN (ref 0–?)
Specific Gravity, Urine: 1.025 (ref 1.000–1.030)
Total Protein, Urine: NEGATIVE
Urine Glucose: NEGATIVE
Urobilinogen, UA: 0.2 (ref 0.0–1.0)
pH: 5 (ref 5.0–8.0)

## 2019-08-31 LAB — CBC
HCT: 44.8 % (ref 36.0–46.0)
Hemoglobin: 14.8 g/dL (ref 12.0–15.0)
MCHC: 33 g/dL (ref 30.0–36.0)
MCV: 91.2 fl (ref 78.0–100.0)
Platelets: 395 10*3/uL (ref 150.0–400.0)
RBC: 4.92 Mil/uL (ref 3.87–5.11)
RDW: 13 % (ref 11.5–15.5)
WBC: 9.4 10*3/uL (ref 4.0–10.5)

## 2019-08-31 LAB — TSH: TSH: 1.06 u[IU]/mL (ref 0.35–4.50)

## 2019-08-31 LAB — LIPID PANEL
Cholesterol: 218 mg/dL — ABNORMAL HIGH (ref 0–200)
HDL: 34.7 mg/dL — ABNORMAL LOW (ref >39.00)
NonHDL: 182.92
Total CHOL/HDL Ratio: 6
Triglycerides: 360 mg/dL — ABNORMAL HIGH (ref 0.0–149.0)
VLDL: 72 mg/dL — ABNORMAL HIGH (ref 0.0–40.0)

## 2019-08-31 LAB — VITAMIN B12: Vitamin B-12: 320 pg/mL (ref 211–911)

## 2019-08-31 LAB — HEMOGLOBIN A1C: Hgb A1c MFr Bld: 5.9 % (ref 4.6–6.5)

## 2019-08-31 LAB — VITAMIN D 25 HYDROXY (VIT D DEFICIENCY, FRACTURES): VITD: 11.2 ng/mL — ABNORMAL LOW (ref 30.00–100.00)

## 2019-08-31 LAB — LDL CHOLESTEROL, DIRECT: Direct LDL: 138 mg/dL

## 2019-08-31 MED ORDER — VITAMIN D (ERGOCALCIFEROL) 1.25 MG (50000 UNIT) PO CAPS: 50000.0000 [IU] | ORAL_CAPSULE | ORAL | 5 refills | Status: DC

## 2019-08-31 NOTE — Addendum Note (Signed)
Addended by: Jon Billings on: 08/31/2019 01:40 PM   Modules accepted: Orders

## 2019-10-04 ENCOUNTER — Encounter: Payer: 59 | Admitting: Obstetrics and Gynecology

## 2019-10-23 ENCOUNTER — Other Ambulatory Visit: Payer: Self-pay

## 2019-10-23 ENCOUNTER — Emergency Department (HOSPITAL_BASED_OUTPATIENT_CLINIC_OR_DEPARTMENT_OTHER): Payer: 59

## 2019-10-23 ENCOUNTER — Emergency Department (HOSPITAL_BASED_OUTPATIENT_CLINIC_OR_DEPARTMENT_OTHER)
Admission: EM | Admit: 2019-10-23 | Discharge: 2019-10-23 | Disposition: A | Discharge status: Home / Self Care | Payer: 59 | Attending: Emergency Medicine | Admitting: Emergency Medicine

## 2019-10-23 ENCOUNTER — Encounter (HOSPITAL_BASED_OUTPATIENT_CLINIC_OR_DEPARTMENT_OTHER): Payer: Self-pay

## 2019-10-23 DIAGNOSIS — Y929 Unspecified place or not applicable: Secondary | ICD-10-CM | POA: Insufficient documentation

## 2019-10-23 DIAGNOSIS — F17210 Nicotine dependence, cigarettes, uncomplicated: Secondary | ICD-10-CM | POA: Insufficient documentation

## 2019-10-23 DIAGNOSIS — Y939 Activity, unspecified: Secondary | ICD-10-CM | POA: Diagnosis not present

## 2019-10-23 DIAGNOSIS — Y999 Unspecified external cause status: Secondary | ICD-10-CM | POA: Diagnosis not present

## 2019-10-23 DIAGNOSIS — X58XXXA Exposure to other specified factors, initial encounter: Secondary | ICD-10-CM | POA: Diagnosis not present

## 2019-10-23 DIAGNOSIS — S161XXA Strain of muscle, fascia and tendon at neck level, initial encounter: Secondary | ICD-10-CM | POA: Diagnosis not present

## 2019-10-23 DIAGNOSIS — S199XXA Unspecified injury of neck, initial encounter: Secondary | ICD-10-CM | POA: Diagnosis present

## 2019-10-23 DIAGNOSIS — M25512 Pain in left shoulder: Secondary | ICD-10-CM | POA: Diagnosis not present

## 2019-10-23 MED ORDER — CYCLOBENZAPRINE HCL 10 MG PO TABS: 10.0000 mg | ORAL_TABLET | Freq: Three times a day (TID) | ORAL | 0 refills | Status: DC | PRN

## 2019-10-23 MED ORDER — PREDNISONE 20 MG PO TABS: 60.0000 mg | ORAL_TABLET | Freq: Every day | ORAL | 0 refills | Status: DC

## 2019-10-23 MED ORDER — CYCLOBENZAPRINE HCL 10 MG PO TABS
10.0000 mg | ORAL_TABLET | Freq: Once | ORAL | Status: AC
  Administered 2019-10-23: 10 mg via ORAL
  Filled 2019-10-23: qty 1

## 2019-10-23 MED ORDER — PREDNISONE 50 MG PO TABS
60.0000 mg | ORAL_TABLET | Freq: Once | ORAL | Status: AC
  Administered 2019-10-23: 60 mg via ORAL
  Filled 2019-10-23: qty 1

## 2019-10-23 MED ORDER — TRAMADOL HCL 50 MG PO TABS
50.0000 mg | ORAL_TABLET | Freq: Once | ORAL | Status: AC
  Administered 2019-10-23: 50 mg via ORAL
  Filled 2019-10-23: qty 1

## 2019-10-23 MED ORDER — TRAMADOL HCL 50 MG PO TABS: 50.0000 mg | ORAL_TABLET | Freq: Four times a day (QID) | ORAL | 0 refills | Status: DC | PRN

## 2019-10-23 NOTE — ED Triage Notes (Signed)
Pt arrives ambulatory to ED with c/o pain to left shoulder and neck, has been having trouble with her shoulder, states that Saturday her neck became stiff and she can not turn her head to the left.

## 2019-10-23 NOTE — ED Provider Notes (Signed)
[facility] EMERGENCY DEPARTMENT Provider Note   CSN: [id number] Arrival date & time: 10/23/19  1408     History Chief Complaint  Patient presents with  . Shoulder Pain    Anne Lewis is a 54 y.o. female.   Shoulder Pain Location:  Shoulder Pain details:    Quality:  Shooting   Severity:  Moderate   Onset quality:  Gradual   Timing:  Intermittent   Progression:  Unchanged Handedness:  Right-handed Dislocation: maybe.   Relieved by:  None tried Worsened by:  Movement Ineffective treatments:  None tried Associated symptoms: neck pain   Associated symptoms: no back pain and no fever   Neck Injury This is a new problem. The current episode started more than 1 week ago. The problem occurs constantly. The problem has not changed since onset.Associated symptoms include headaches. Pertinent negatives include no chest pain, no abdominal pain and no shortness of breath. The symptoms are aggravated by bending and twisting. Nothing relieves the symptoms. She has tried a warm compress for the symptoms. The treatment provided no relief.     she is here with a complaint of left shoulder pain has been bothering her on and off for a few months.  She said she had an injury to it and she thinks she might of dislocated it and it popped it back in.  She is also had over the past 1 week serious posterior neck pain that causes her to not be able to range of motion her neck.  Sometimes associated with some shooting pain down her arms.  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  She has tried no medication for it.  She rates the pain is severe.     Past Medical History:  Diagnosis Date  . BRCA negative 11/2013  . H pylori ulcer   . Ulcer of gastric fundus     Patient Active Problem List   Diagnosis Date Noted  . Paresthesias 08/30/2019  . Weight gain 08/30/2019  . Depression 08/30/2019  . Reactive airway disease 10/06/2018  . Osteoarthritis of spine with  radiculopathy, cervical region 04/16/2018  . Urticaria 04/16/2018  . Tooth pain 04/16/2018  . Acute non-recurrent sinusitis 08/19/2017  . Lateral epicondylitis of left elbow 08/19/2017  . Cough 08/19/2017  . Healthcare maintenance 08/19/2017  . Family history of malignant neoplasm of breast 11/08/2013  . Family history of malignant neoplasm of gastrointestinal tract 11/08/2013  . Family history of malignant neoplasm of ovary 11/08/2013  . RUQ PAIN 08/08/2010  . DYSPEPSIA&OTHER [facility] DISORDERS FUNCTION STOMACH 08/07/2010  . ABDOMINAL PAIN, EPIGASTRIC 08/07/2010  . OTHER CONSTIPATION 12/05/2009    Past Surgical History:  Procedure Laterality Date  . CESAREAN SECTION  [id number]  . ENDOMETRIAL CRYOABLATION  12.19.2012   HER OPTION - office performed  . TONSILLECTOMY  1987     OB History    Gravida  3   Para  3   Term      Preterm      AB      Living  3     SAB      TAB      Ectopic      Multiple      Live Births              Family History  Problem Relation Age of Onset  . Cancer Cousin 39       ovarian  . Cancer Cousin 64  breast  . Hypertension Mother   . Cancer Mother 41       intestational and ovarian  . Diverticulitis Mother   . Diabetes Father   . Hypertension Father   . Heart disease Father   . Cancer Sister 95       ovarian and intestional  . Breast cancer Maternal Aunt 60  . Cancer Brother 24       GI cancer  . Cancer Paternal Aunt 30       stomach  . Cancer Maternal Grandmother 60       ovarian and pancreatic  . Cancer Maternal Grandfather 74       pancreatic    Social History   Tobacco Use  . Smoking status: Current Some Day Smoker    Types: Cigarettes  . Smokeless tobacco: Never Used  . Tobacco comment: occ cigarette when she goes our for drinks. otherwise no.   Substance Use Topics  . Alcohol use: Yes    Comment: occ  . Drug use: No    Home Medications Prior to Admission medications   Medication Sig Start Date  End Date Taking? Authorizing Provider  benzonatate (TESSALON) 200 MG capsule Take 1 capsule (200 mg total) by mouth 2 (two) times daily as needed for cough. Patient not taking: Reported on 08/30/2019 10/08/18   Libby Maw, MD  cetirizine (ZYRTEC) 10 MG tablet Take 1 tablet (10 mg total) by mouth at bedtime. As needed for hives Patient not taking: Reported on 08/30/2019 04/16/18   Libby Maw, MD  DULoxetine (CYMBALTA) 30 MG capsule Take 1 capsule (30 mg total) by mouth daily. In the morning. 08/30/19   Libby Maw, MD  fluticasone [facility]) 50 MCG/ACT nasal spray Place 1 spray into both nostrils daily. Patient not taking: Reported on 08/30/2019 10/02/18   Henderly, Britni A, PA-C  guaifenesin (ROBITUSSIN) 100 MG/5ML syrup Take 5-10 mLs (100-200 mg total) by mouth every 4 (four) hours as needed for cough. Patient not taking: Reported on 08/30/2019 10/02/18   Henderly, Britni A, PA-C  hydrOXYzine (ATARAX/VISTARIL) 25 MG tablet Take 1 tablet (25 mg total) by mouth at bedtime as needed. 08/30/19   Libby Maw, MD  Vitamin D, Ergocalciferol, (DRISDOL) 1.25 MG (50000 UNIT) CAPS capsule Take 1 capsule (50,000 Units total) by mouth every 7 (seven) days. 08/31/19   Libby Maw, MD    Allergies    Aspirin, Augmentin [amoxicillin-pot clavulanate], and Oxycodone  Review of Systems   Review of Systems  Constitutional: Negative for fever.  HENT: Negative for sore throat.   Eyes: Negative for visual disturbance.  Respiratory: Negative for shortness of breath.   Cardiovascular: Negative for chest pain.  Gastrointestinal: Negative for abdominal pain.  Genitourinary: Negative for dysuria.  Musculoskeletal: Positive for neck pain. Negative for back pain.  Skin: Negative for rash.  Neurological: Positive for headaches.    Physical Exam Updated Vital Signs BP 121/63 (BP Location: Right Arm)   Pulse 92   Temp 98.3 F (36.8 C) (Oral)   Resp 18   Ht '5\' 9"'  (1.753 m)    Wt 75.8 kg   SpO2 99%   BMI 24.66 kg/m   Physical Exam Vitals and nursing note reviewed.  Constitutional:      General: She is not in acute distress.    Appearance: She is well-developed.  HENT:     Head: Normocephalic and atraumatic.  Eyes:     Conjunctiva/sclera: Conjunctivae normal.  Neck:  Comments: She has no midline cervical spine tenderness.  She has limited range of motion secondary to pain.  There is diffuse muscular tenderness from her occiput through her paracervical muscles into her trapezius. Cardiovascular:     Rate and Rhythm: Normal rate and regular rhythm.     Heart sounds: No murmur.  Pulmonary:     Effort: Pulmonary effort is normal. No respiratory distress.     Breath sounds: Normal breath sounds.  Abdominal:     Palpations: Abdomen is soft.     Tenderness: There is no abdominal tenderness.  Musculoskeletal:        General: Tenderness present.     Cervical back: Neck supple. Tenderness present.     Comments: Left shoulder tenderness through the anterior shoulder.  Normal internal and external rotation.  Pain with abduction and internal rotation.  Normal landmarks.  Elbow wrist nontender full range of motion.  Lymphadenopathy:     Cervical: No cervical adenopathy.  Skin:    General: Skin is warm and dry.     Capillary Refill: Capillary refill takes less than 2 seconds.  Neurological:     General: No focal deficit present.     Mental Status: She is alert.     Sensory: No sensory deficit.     Motor: No weakness.     ED Results / Procedures / Treatments   Labs (all labs ordered are listed, but only abnormal results are displayed) Labs Reviewed - No data to display  EKG None  Radiology DG Cervical Spine Complete  Result Date: 10/23/2019 CLINICAL DATA:  Left neck and shoulder pain. No known injury. EXAM: CERVICAL SPINE - COMPLETE 4+ VIEW COMPARISON:  Cervical radiographs 04/04/2018 FINDINGS: Unchanged alignment with reversal of normal lordosis  and trace anterolisthesis of C3 on C4. Disc space narrowing at C5-C6 and C6-C7 with endplate spurring. Mild bilateral bony neural foraminal stenosis at C5-C6. Lateral masses of C1 are well aligned on C2. No prevertebral soft tissue edema. No evidence of fracture or bone destruction. IMPRESSION: Unchanged reversal of normal lordosis and trace anterolisthesis of C3 on C4 compared to 2019 radiograph. 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 Mild bilateral bony neural foraminal stenosis at C5-C6, slightly progressed. Electronically Signed   By: Keith Rake M.D.   On: 10/23/2019 15:47   DG Shoulder Left  Result Date: 10/23/2019 CLINICAL DATA:  Left neck and shoulder pain. No known injury. EXAM: LEFT SHOULDER - 2+ VIEW COMPARISON:  None. FINDINGS: There is no evidence of fracture or dislocation. Minimal acromioclavicular spurring. There is no other evidence of arthropathy or other focal bone abnormality. Soft tissues are unremarkable. IMPRESSION: Minimal degenerative acromioclavicular spurring. Electronically Signed   By: Keith Rake M.D.   On: 10/23/2019 15:44    Procedures Procedures (including critical care time)  Medications Ordered in ED Medications - No data to display  ED Course  I have reviewed the triage vital signs and the nursing notes.  Pertinent labs & imaging results that were available during my care of the patient were reviewed by me and considered in my medical decision making (see chart for details).  Clinical Course as of Oct 24 1010  Sat Oct 23, 2019  1514 Differential diagnosis includes musculoskeletal, radiculopathy, fracture, dislocation   [MB]  1557 Cervical spine x-ray interpreted by me as showing some foraminal narrowing and some straightening.  No obvious fractures.  Shoulder x-ray showing normal location with no fracture or dislocation interpreted by me.   [MB]  7939  Reviewed patient's imaging results with her.  She has been taking Aleve without any  improvement.  She said she took Flexeril and tramadol the last time this happened.  She also was on steroids at that time.  I think that is a reasonable approach for this episode and have prescribed them for her.  She understands to follow-up with her doctor and also giving her the number for sports medicine here.  Return instructions discussed.   [MB]    Clinical Course User Index [MB] Hayden Rasmussen, MD   MDM Rules/Calculators/A&P                       Final Clinical Impression(s) / ED Diagnoses Final diagnoses:  Cervical strain, acute, initial encounter  Acute pain of left shoulder    Rx / DC Orders ED Discharge Orders         Ordered    traMADol (ULTRAM) 50 MG tablet  Every 6 hours PRN     10/23/19 1606    cyclobenzaprine (FLEXERIL) 10 MG tablet  3 times daily PRN     10/23/19 1606    predniSONE (DELTASONE) 20 MG tablet  Daily     10/23/19 1606           Hayden Rasmussen, MD 10/24/19 1013

## 2019-10-23 NOTE — Discharge Instructions (Addendum)
You were seen in the emergency department for neck pain and spasm along with left shoulder pain.  You had some x-rays that showed some straightening and some degenerative changes.  Please continue to take Aleve twice a day.  Warm compress.  We are prescribing you some pain medication and some anti-inflammatories and muscle relaxant.  Please follow-up with your doctor and sports medicine.  Return to the emergency department if any acute worsening symptoms.

## 2019-11-10 ENCOUNTER — Telehealth: Payer: Self-pay | Admitting: Family Medicine

## 2019-11-10 DIAGNOSIS — M25511 Pain in right shoulder: Secondary | ICD-10-CM

## 2019-11-10 DIAGNOSIS — M542 Cervicalgia: Secondary | ICD-10-CM

## 2019-11-10 DIAGNOSIS — G8929 Other chronic pain: Secondary | ICD-10-CM

## 2019-11-10 NOTE — Telephone Encounter (Signed)
Called patient to go over concerns and request for referral. No answer LMTCB

## 2019-11-10 NOTE — Telephone Encounter (Signed)
Patient states she is still having swelling on the back of her neck and pain from arthritis of neck and shoulders. She said Dr. Ethelene Hal mentioned referring her somewhere and she is now requesting that referral.

## 2019-11-10 NOTE — Telephone Encounter (Signed)
Spoke with patient who states that she have had pain that come and go in her neck and shoulders for a few years. She have seen dr. Ethelene Hal about this but just recently went to ER and they gave pain medications that really didn't help. Patient was told at ER that she needed to be referred to Sports medicine or see a spine specialist. Patient calling to see if dr. Ethelene Hal would refer her to either? Please advise.

## 2019-11-11 ENCOUNTER — Other Ambulatory Visit: Payer: Self-pay | Admitting: Family Medicine

## 2019-11-11 DIAGNOSIS — L509 Urticaria, unspecified: Secondary | ICD-10-CM

## 2019-11-11 DIAGNOSIS — F32A Depression, unspecified: Secondary | ICD-10-CM

## 2019-11-11 DIAGNOSIS — F329 Major depressive disorder, single episode, unspecified: Secondary | ICD-10-CM

## 2019-11-11 DIAGNOSIS — R202 Paresthesia of skin: Secondary | ICD-10-CM

## 2019-11-11 NOTE — Telephone Encounter (Signed)
Spoke with patient who verbally understood referral sent to Sports Medicine for an appointment.

## 2019-11-18 ENCOUNTER — Encounter: Payer: Self-pay | Admitting: Family Medicine

## 2019-11-18 ENCOUNTER — Other Ambulatory Visit: Payer: Self-pay

## 2019-11-18 ENCOUNTER — Ambulatory Visit: Payer: Self-pay

## 2019-11-18 ENCOUNTER — Ambulatory Visit (INDEPENDENT_AMBULATORY_CARE_PROVIDER_SITE_OTHER): Payer: 59 | Admitting: Family Medicine

## 2019-11-18 DIAGNOSIS — M25512 Pain in left shoulder: Secondary | ICD-10-CM

## 2019-11-18 DIAGNOSIS — M62838 Other muscle spasm: Secondary | ICD-10-CM | POA: Diagnosis not present

## 2019-11-18 DIAGNOSIS — G8929 Other chronic pain: Secondary | ICD-10-CM

## 2019-11-18 DIAGNOSIS — M5481 Occipital neuralgia: Secondary | ICD-10-CM | POA: Diagnosis not present

## 2019-11-18 MED ORDER — METHOCARBAMOL 500 MG PO TABS: 500.0000 mg | ORAL_TABLET | Freq: Three times a day (TID) | ORAL | 0 refills | Status: DC

## 2019-11-18 NOTE — Patient Instructions (Addendum)
Thank you for coming in today. Plan for PT.  Use heating pad and TENS unit We will try metocrobanol muscle relaxer.  Recheck with me in 4 weeks.  Contact me or return sooner if worsening or not doing well.   TENS UNIT: This is helpful for muscle pain and spasm.   Search and Purchase a TENS 7000 2nd edition at  www.tenspros.com or www.[email or web address].com It should be less than $30.     TENS unit instructions: Do not shower or bathe with the unit on Turn the unit off before removing electrodes or batteries If the electrodes lose stickiness add a drop of water to the electrodes after they are disconnected from the unit and place on plastic sheet. If you continued to have difficulty, call the TENS unit company to purchase more electrodes. Do not apply lotion on the skin area prior to use. Make sure the skin is clean and dry as this will help prolong the life of the electrodes. After use, always check skin for unusual red areas, rash or other skin difficulties. If there are any skin problems, does not apply electrodes to the same area. Never remove the electrodes from the unit by pulling the wires. Do not use the TENS unit or electrodes other than as directed. Do not change electrode placement without consultating your therapist or physician. Keep 2 fingers with between each electrode. Wear time ratio is 2:1, on to off times.    For example on for 30 minutes off for 15 minutes and then on for 30 minutes off for 15 minutes

## 2019-11-18 NOTE — Progress Notes (Signed)
Subjective:    I'm seeing this patient as a consultation for:  Dr. Ethelene Hal. Note will be routed back to referring provider/PCP.  CC: Neck and B shoulder pain  I, Molly Weber, LAT, ATC, am serving as scribe for Dr. Lynne Leader.  HPI: Pt is a 54 y/o female presenting w/ c/o chronic neck and B shoulder pain, L>R.  She states that she may have dislocated or subluxed her L shoulder but isn't sure.   She notes pain is located at the left shoulder.  She has pain the anterior and superior shoulder.  Pain is worse with shoulder motion and at bedtime.  No pain radiating below the level of the elbow weakness or numbness distally.  Additionally patient has significant posterior neck pain.  Pain located posterior neck and in the lateral trapezius.  Significant pain with neck motion.  She had a terrible spasm earlier this month and was seen in the emergency room where x-rays showed DDD and loss of cervical lordosis.  She was prescribed steroids muscle relaxers and tramadol which did not help much.  She is not had trials of physical therapy.   Diagnostic testing: L shoulder and c-spine XR- 10/23/19  Past medical history, Surgical history, Family history, Social history, Allergies, and medications have been entered into the medical record, reviewed.   Review of Systems: No new headache, visual changes, nausea, vomiting, diarrhea, constipation, dizziness, abdominal pain, skin rash, fevers, chills, night sweats, weight loss, swollen lymph nodes, body aches, joint swelling, muscle aches, chest pain, shortness of breath, mood changes, visual or auditory hallucinations.   Objective:    General: Well Developed, well nourished, and in no acute distress.  Neuro/Psych: Alert and oriented x3, extra-ocular muscles intact, able to move all 4 extremities, sensation grossly intact. Skin: Warm and dry, no rashes noted.  Respiratory: Not using accessory muscles, speaking in full sentences, trachea midline.    Cardiovascular: Pulses palpable, no extremity edema. Abdomen: Does not appear distended. MSK:  C-spine: Normal-appearing Nontender cervical midline. Tender palpation cervical paraspinal musculature. Significant decreased cervical motion. Upper extremity strength is intact reflexes and sensation are intact distally.  Left shoulder: Normal-appearing Tender palpation AC joint nontender otherwise. Significant decrease shoulder motion Abduction limited 110 degrees.  External rotation full.  Internal rotation to lumbar spine. Strength intact within limits of motion however pain with abduction. Positive empty can test. Unable to get an adequate positioning for Hawkins or Neer's test. Negative Yergason's and speeds test.  Lab and Radiology Results DG Cervical Spine Complete  Result Date: 10/23/2019 CLINICAL DATA:  Left neck and shoulder pain. No known injury. EXAM: CERVICAL SPINE - COMPLETE 4+ VIEW COMPARISON:  Cervical radiographs 04/04/2018 FINDINGS: Unchanged alignment with reversal of normal lordosis and trace anterolisthesis of C3 on C4. Disc space narrowing at C5-C6 and C6-C7 with endplate spurring. Mild bilateral bony neural foraminal stenosis at C5-C6. Lateral masses of C1 are well aligned on C2. No prevertebral soft tissue edema. No evidence of fracture or bone destruction. IMPRESSION: Unchanged reversal of normal lordosis and trace anterolisthesis of C3 on C4 compared to 2019 radiograph. 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 Mild bilateral bony neural foraminal stenosis at C5-C6, slightly progressed. Electronically Signed   By: Keith Rake M.D.   On: 10/23/2019 15:47   DG Shoulder Left  Result Date: 10/23/2019 CLINICAL DATA:  Left neck and shoulder pain. No known injury. EXAM: LEFT SHOULDER - 2+ VIEW COMPARISON:  None. FINDINGS: There is no evidence of fracture or dislocation. Minimal  acromioclavicular spurring. There is no other evidence of arthropathy or other focal bone  abnormality. Soft tissues are unremarkable. IMPRESSION: Minimal degenerative acromioclavicular spurring. Electronically Signed   By: Keith Rake M.D.   On: 10/23/2019 15:44   I, Lynne Leader, personally (independently) visualized and performed the interpretation of the images attached in this note.   Diagnostic Limited MSK Ultrasound of: Left shoulder Biceps tendon intact normal-appearing in bicipital groove. Subscapularis tendon intact. Supraspinatus tendon intact. Infraspinatus tendon intact. AC joint degenerative with large effusion. Impression: AC joint effusion and DJD.  Procedure: Real-time Ultrasound Guided Injection of left shoulder AC joint Device: Philips Affiniti 50G Images permanently stored and available for review in the ultrasound unit. Verbal informed consent obtained.  Discussed risks and benefits of procedure. Warned about infection bleeding damage to structures skin hypopigmentation and fat atrophy among others. Patient expresses understanding and agreement Time-out conducted.   Noted no overlying erythema, induration, or other signs of local infection.   Skin prepped in a sterile fashion.   Local anesthesia: Topical Ethyl chloride.   With sterile technique and under real time ultrasound guidance:  40 mg of Depo-Medrol and 0.5 mL of Marcaine.  Total volume 1 mL injected easily.   Completed without difficulty   Pain partially resolved suggesting accurate placement of the medication.   Advised to call if fevers/chills, erythema, induration, drainage, or persistent bleeding.   Images permanently stored and available for review in the ultrasound unit.  Impression: Technically successful ultrasound guided injection.      Impression and Recommendations:    Assessment and Plan: 54 y.o. female with pain cervical spine.  Quite severe.  Patient has degenerative changes seen on recent x-ray as well as evidence of spasm.  She is quite bothered by her pain and is failing  initial conservative management.  At this point neck step is physical therapy.  We will place referral to physical therapy.  We will try different muscle relaxer methocarbamol this time.  Some contributing to headache thought to be occipital neuralgia.  Also recommend heating pad and TENS unit.  Recheck in a month return sooner if needed.  Additionally patient has considerable left shoulder pain.  She is tender at her [facility] joint however injection at [facility] joint did not fully relieve her pain.  It is likely that she has other issues not seen on ultrasound today possibly labrum tear.  Plan for physical therapy as well.  Recheck in 1 month return sooner if needed..   Orders Placed This Encounter  Procedures  . [place] LIMITED JOINT SPACE STRUCTURES UP LEFT(NO LINKED CHARGES)    Order Specific Question:   Reason for Exam (SYMPTOM  OR DIAGNOSIS REQUIRED)    Answer:   eval left shoulder pain    Order Specific Question:   Preferred imaging location?    Answer:   [place]  . Ambulatory referral to Physical Therapy    Referral Priority:   Routine    Referral Type:   Physical Medicine    Referral Reason:   Specialty Services Required    Requested Specialty:   Physical Therapy   Meds ordered this encounter  Medications  . methocarbamol (ROBAXIN) 500 MG tablet    Sig: Take 1 tablet (500 mg total) by mouth 3 (three) times daily.    Dispense:  90 tablet    Refill:  0    Discussed warning signs or symptoms. Please see discharge instructions. Patient expresses understanding.   The above documentation has been  reviewed and is accurate and complete Lynne Leader

## 2019-12-17 ENCOUNTER — Ambulatory Visit: Payer: 59 | Admitting: Family Medicine

## 2019-12-28 ENCOUNTER — Ambulatory Visit: Payer: 59 | Admitting: Physical Therapy

## 2020-06-12 ENCOUNTER — Other Ambulatory Visit: Payer: Self-pay

## 2020-06-14 ENCOUNTER — Ambulatory Visit: Payer: 59 | Admitting: Family

## 2020-06-20 ENCOUNTER — Ambulatory Visit (INDEPENDENT_AMBULATORY_CARE_PROVIDER_SITE_OTHER): Payer: 59 | Admitting: Family

## 2020-06-20 ENCOUNTER — Ambulatory Visit (INDEPENDENT_AMBULATORY_CARE_PROVIDER_SITE_OTHER): Payer: 59

## 2020-06-20 ENCOUNTER — Encounter: Payer: Self-pay | Admitting: Family

## 2020-06-20 ENCOUNTER — Other Ambulatory Visit: Payer: Self-pay

## 2020-06-20 VITALS — BP 122/82 | HR 90 | Temp 98.1°F | Ht 69.0 in | Wt 198.8 lb

## 2020-06-20 DIAGNOSIS — M25462 Effusion, left knee: Secondary | ICD-10-CM | POA: Diagnosis not present

## 2020-06-20 DIAGNOSIS — M25562 Pain in left knee: Secondary | ICD-10-CM

## 2020-06-20 DIAGNOSIS — H538 Other visual disturbances: Secondary | ICD-10-CM

## 2020-06-20 DIAGNOSIS — R682 Dry mouth, unspecified: Secondary | ICD-10-CM

## 2020-06-20 NOTE — Patient Instructions (Signed)
Iliotibial Bursitis  Iliotibial bursitis is inflammation of the bursa on the outside of the knee. A bursa is a fluid-filled sac that is often found near a joint. Bursas act as cushions to help tendons glide smoothly over bony surfaces during joint movement. The iliotibial bursa is located beneath a long tendon (iliotibial band) that connects muscles of the buttock, hip, and upper leg to the outside of the shin bone. This condition is also called iliotibial band friction syndrome. What are the causes? This condition is caused by:  Repeated rubbing of the tendon over the bursa, which occurs when you do activities over and over. This friction causes fluid to build up inside the bursa.  The buildup of fluid inside the bursa causes it to swell. The swollen bursa causes pain in the area where it is located. What increases the risk? The following factors may make you more likely to develop this condition:  Doing athletic activities that involve repetitive squatting, running, cutting, and side-to-side movements.  Overtraining, or starting a new athletic activity without gradually increasing your time and distance.  Participating in certain sports, such as: ? Basketball. ? Cross-country running. ? Football. ? Rugby. ? Racquet sports. ? Soccer. ? Volleyball. ? Cycling.  Being 51-20 years old and having knee arthritis.  Being a middle-aged woman who is overweight.  Having flat feet or knee deformities.  Having diabetes. What are the signs or symptoms? Symptoms of this condition include:  Pain on the outside of your knee. The pain may also be felt on the outside of your leg near the knee.  Tenderness when pressing on the side of your knee.  Knee swelling that may or may not include increased warmth or redness.  Pain that gets worse with activity such as: ? Kneeling. ? Walking down the stairs. ? Prolonged walking or running. How is this diagnosed? This condition is usually  diagnosed based on your symptoms, your medical history, and a physical exam. During the exam, your health care provider will check your:  Knee motion.  Knee strength.  Amount of pain when the outside of your knee is touched or pressed on.  Ability to do activities such as walking or climbing stairs. Rarely, other tests may be done to rule out other causes of your symptoms. These tests may include:  MRI.  Ultrasound. How is this treated? Treatment for this condition may include:  Avoiding activities that cause pain and swelling.  Icing your knee.  Applying heat to your knee.  Wearing an elastic wrap or sleeve to support your knee.  Keeping your knee raised (elevated) when resting.  Taking medicine to reduce pain and swelling.  Having an injection of numbing medicine or anti-inflammatory medicine (steroid) into the bursa to see if the pain will go away.  Doing stretching and strengthening exercises. Treatment usually improves the pain in 6-8 weeks. Surgery is sometimes needed to drain or remove the bursa. Follow these instructions at home: If you have a compression wrap or sleeve:  Wear it as told by your health care provider. Remove it only as told by your health care provider.  Loosen the wrap or sleeve if your foot or toes tingle, become numb, or turn cold and blue.  Keep the wrap or sleeve clean.  If the wrap or sleeve is not waterproof: ? Do not let it get wet. ? Cover it with a watertight covering when you take a bath or shower. Managing pain, stiffness, and swelling  If directed, put ice on the knee. ? If you have a removable wrap or sleeve, remove it as told by your health care provider. ? Put ice in a plastic bag. ? Place a towel between your skin and the bag. ? Leave the ice on for 20 minutes, 2-3 times a day.  Move your toes often to avoid stiffness and to lessen swelling.  Raise (elevate) your leg above the level of your heart while you are  sitting or lying down.  If directed, apply heat to the affected area. Use the heat source that your health care provider recommends, such as a moist heat pack or a heating pad. ? Place a towel between your skin and the heat source. ? Leave the heat on for 20-30 minutes. ? Remove the heat if your skin turns bright red. This is especially important if you are unable to feel pain, heat, or cold. You may have a greater risk of getting burned. Activity  Return to your normal activities as told by your health care provider. Ask your health care provider what activities are safe for you.  Do exercises as told by your health care provider. General instructions  Take over-the-counter and prescription medicines only as told by your health care provider.  Keep all follow-up visits as told by your health care provider. This is important. How is this prevented?  Warm up and stretch before being active.  Cool down and stretch after being active.  Give your body time to rest between periods of activity.  Make sure to use equipment that fits you.  Maintain physical fitness, including: ? Strength. ? Flexibility. Contact a health care provider if:  You have pain that is not relieved by rest or treatment.  Your symptoms get worse or do not improve with home care. Summary  Iliotibial bursitis is inflammation of the bursa on the outside of the knee.  Symptoms of this condition include pain on the outside of the knee that gets worse with activity.  Treatment includes resting the knee, ice, compression, and sometimes pain medicines. This information is not intended to replace advice given to you by your health care provider. Make sure you discuss any questions you have with your health care provider. Document Revised: 10/29/2018 Document Reviewed: 12/24/2017 Elsevier Patient Education  [place], Adult        Having blurred vision means that you cannot see  things clearly. Your vision may seem fuzzy or out of focus. It can involve your vision for objects that are close or far away. It may affect one or both eyes. There are many causes of blurred vision, including cataracts, macular degeneration, eye inflammation (uveitis), and diabetic retinopathy. In many cases, blurred vision has to do with the shape of your eye. An abnormal eye shape means you cannot focus well (refractive error). When this happens, it can cause:  Faraway objects to look blurry (nearsightedness).  Close objects to look blurry (farsightedness).  Blurry vision at any distance (astigmatism). Refractive errors are often corrected with glasses or contacts. Blurred vision can be diagnosed based on your symptoms and a physical exam. Tell your health care provider about any other health problems you have, any recent eye injury, and any prior surgeries. You may need to see a health care provider who specializes in eye problems (ophthalmologist). Your treatment will depend on what is causing your blurred vision. Follow these instructions at home:  Keep all follow-up visits as told  by your health care provider. This is important. These include any visits to your eye specialists.  Do not drive or use heavy machinery if your vision is blurry.  Use eye drops only as told by your health care provider.  If you were prescribed glasses or contact lenses, wear the glasses or contacts as told by your health care provider.  Schedule eye exams regularly.  Pay attention to any changes in your symptoms. Contact a health care provider if:  Your symptoms do not improve or they get worse.  You have: ? New symptoms. ? A headache. ? Trouble seeing at night. ? Trouble noticing the difference between colors.  You notice: ? Drooping of your eyelids. ? Drainage coming from your eyes. ? A rash around your eyes. Get help right away if:  You have: ? Severe eye pain. ? A severe headache. ? A  sudden change in vision. ? A sudden loss of vision. ? A vision change after an injury.  You notice flashing lights in your field of vision. Your field of vision is the area that you can see without moving your eyes. Summary  Having blurred vision means that you cannot see things clearly. Your vision may seem fuzzy or out of focus.  There are many causes of blurred vision. In many cases, blurred vision has to do with an abnormal eye shape (refractive error), and it can be corrected with glasses or contact lenses.  Pay attention to any changes in your symptoms. Contact a health care provider if your symptoms do not improve or if you have any new symptoms. This information is not intended to replace advice given to you by your health care provider. Make sure you discuss any questions you have with your health care provider. Document Revised: 10/02/2017 Document Reviewed: 10/25/2016 Elsevier Patient Education  [place].

## 2020-06-20 NOTE — Progress Notes (Signed)
Acute Office Visit  Subjective:    Patient ID: Anne Lewis, female    DOB: 08-23-1965, 54 y.o.   MRN: [id number]  Chief Complaint  Patient presents with  . Acute Visit    Pt c/o blurriness in both eyes, x 3 months happening more often.  Pt also said that she has been having dry mouth    HPI Patient is in today with concerns of blurred vision that happens suddenly and now more frequently over the oast 3 months. The blurred vision lasts all day. Patient reports being shaky and anxious when this occurs. She looks at a computer screen all day. The incident happened outside of working. She reports having to use her glasses to see the television now.Also reports having hot flashes.  Has a family history of hypothyroidism.   Also has complaints of left knee pain and swelling. Pain 6/10, worse with ambulation. Has injured the knee in the past. No new injury. 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 Not taking any medications for the pain/swelling. Allergy to ASA  Past Medical History:  Diagnosis Date  . BRCA negative 11/2013  . H pylori ulcer   . Ulcer of gastric fundus     Past Surgical History:  Procedure Laterality Date  . CESAREAN SECTION  [id number]  . ENDOMETRIAL CRYOABLATION  12.19.2012   HER OPTION - office performed  . TONSILLECTOMY  1987    Family History  Problem Relation Age of Onset  . Cancer Cousin 39       ovarian  . Cancer Cousin 110       breast  . Hypertension Mother   . Cancer Mother 72       intestational and ovarian  . Diverticulitis Mother   . Diabetes Father   . Hypertension Father   . Heart disease Father   . Cancer Sister 28       ovarian and intestional  . Breast cancer Maternal Aunt 60  . Cancer Brother 24       GI cancer  . Cancer Paternal Aunt 52       stomach  . Cancer Maternal Grandmother 24       ovarian and pancreatic  . Cancer Maternal Grandfather 82       pancreatic    Social History   Socioeconomic History  . Marital  status: Divorced    Spouse name: Not on file  . Number of children: Not on file  . Years of education: Not on file  . Highest education level: Not on file  Occupational History  . Not on file  Tobacco Use  . Smoking status: Current Some Day Smoker    Types: Cigarettes  . Smokeless tobacco: Never Used  . Tobacco comment: occ cigarette when she goes our for drinks. otherwise no.   Vaping Use  . Vaping Use: Never used  Substance and Sexual Activity  . Alcohol use: Yes    Comment: occ  . Drug use: No  . Sexual activity: Not on file  Other Topics Concern  . Not on file  Social History Narrative  . Not on file   Social Determinants of Health   Financial Resource Strain:   . Difficulty of Paying Living Expenses: Not on file  Food Insecurity:   . Worried About Charity fundraiser in the Last Year: Not on file  . Ran Out of Food in the Last Year: Not on file  Transportation Needs:   . Lack of Transportation (  Medical): Not on file  . Lack of Transportation (Non-Medical): Not on file  Physical Activity:   . Days of Exercise per Week: Not on file  . Minutes of Exercise per Session: Not on file  Stress:   . Feeling of Stress : Not on file  Social Connections:   . Frequency of Communication with Friends and Family: Not on file  . Frequency of Social Gatherings with Friends and Family: Not on file  . Attends Religious Services: Not on file  . Active Member of Clubs or Organizations: Not on file  . Attends Archivist Meetings: Not on file  . Marital Status: Not on file  Intimate Partner Violence:   . Fear of Current or Ex-Partner: Not on file  . Emotionally Abused: Not on file  . Physically Abused: Not on file  . Sexually Abused: Not on file    Outpatient Medications Prior to Visit  Medication Sig Dispense Refill  . cyclobenzaprine (FLEXERIL) 10 MG tablet Take 1 tablet (10 mg total) by mouth 3 (three) times daily as needed for muscle spasms. 15 tablet 0  .  hydrOXYzine (ATARAX/VISTARIL) 25 MG tablet TAKE 1 TABLET (25 MG TOTAL) BY MOUTH AT BEDTIME AS NEEDED. 30 tablet 1  . traMADol (ULTRAM) 50 MG tablet Take 1 tablet (50 mg total) by mouth every 6 (six) hours as needed. 15 tablet 0  . Vitamin D, Ergocalciferol, (DRISDOL) 1.25 MG (50000 UNIT) CAPS capsule Take 1 capsule (50,000 Units total) by mouth every 7 (seven) days. 5 capsule 5  . benzonatate (TESSALON) 200 MG capsule Take 1 capsule (200 mg total) by mouth 2 (two) times daily as needed for cough. (Patient not taking: Reported on 08/30/2019) 20 capsule 0  . cetirizine (ZYRTEC) 10 MG tablet Take 1 tablet (10 mg total) by mouth at bedtime. As needed for hives (Patient not taking: Reported on 08/30/2019) 30 tablet 0  . DULoxetine (CYMBALTA) 30 MG capsule TAKE 1 CAPSULE (30 MG TOTAL) BY MOUTH DAILY. IN THE MORNING. (Patient not taking: Reported on 06/20/2020) 30 capsule 1  . fluticasone (FLONASE) 50 MCG/ACT nasal spray Place 1 spray into both nostrils daily. (Patient not taking: Reported on 08/30/2019) 11.1 g 2  . guaifenesin (ROBITUSSIN) 100 MG/5ML syrup Take 5-10 mLs (100-200 mg total) by mouth every 4 (four) hours as needed for cough. (Patient not taking: Reported on 08/30/2019) 60 mL 0  . methocarbamol (ROBAXIN) 500 MG tablet Take 1 tablet (500 mg total) by mouth 3 (three) times daily. (Patient not taking: Reported on 06/20/2020) 90 tablet 0  . predniSONE (DELTASONE) 20 MG tablet Take 3 tablets (60 mg total) by mouth daily. (Patient not taking: Reported on 06/20/2020) 12 tablet 0   No facility-administered medications prior to visit.    Allergies  Allergen Reactions  . Aspirin Swelling and Other (See Comments)    Bleeding  . Augmentin [Amoxicillin-Pot Clavulanate] Swelling  . Oxycodone Nausea And Vomiting    Review of Systems  Constitutional: Negative.   HENT: Negative.   Eyes: Positive for itching and visual disturbance. Negative for photophobia and pain.       Blurred vision  Respiratory:  Negative.   Cardiovascular: Negative.   Genitourinary: Negative.   Musculoskeletal: Positive for arthralgias.       Left knee pain and swelling  Skin: Negative.   Neurological: Negative.  Negative for dizziness, tremors and light-headedness.  Psychiatric/Behavioral: Negative.        Objective:    Physical Exam Constitutional:  Appearance: Normal appearance. She is normal weight.  HENT:     Head: Normocephalic.     Right Ear: Tympanic membrane normal.     Left Ear: Tympanic membrane normal.  Eyes:     General:        Right eye: No discharge.        Left eye: No discharge.     Extraocular Movements: Extraocular movements intact.     Conjunctiva/sclera: Conjunctivae normal.     Pupils: Pupils are equal, round, and reactive to light.  Cardiovascular:     Rate and Rhythm: Normal rate and regular rhythm.  Pulmonary:     Effort: Pulmonary effort is normal.     Breath sounds: Normal breath sounds.  Musculoskeletal:     Cervical back: Normal range of motion and neck supple.       Legs:  Skin:    General: Skin is warm.  Neurological:     General: No focal deficit present.     Mental Status: She is alert. Mental status is at baseline. She is disoriented.  Psychiatric:        Mood and Affect: Mood normal.        Behavior: Behavior normal.     BP 122/82 (BP Location: Left Arm, Patient Position: Sitting, Cuff Size: Normal)   Pulse 90   Temp 98.1 F (36.7 C) (Temporal)   Ht '5\' 9"'  (1.753 m)   Wt 198 lb 12.8 oz (90.2 kg)   SpO2 98%   BMI 29.36 kg/m  Wt Readings from Last 3 Encounters:  06/20/20 198 lb 12.8 oz (90.2 kg)  10/23/19 167 lb (75.8 kg)  08/30/19 198 lb (89.8 kg)    Health Maintenance Due  Topic Date Due  . Hepatitis C Screening  Never done  . COVID-19 Vaccine (1) Never done  . HIV Screening  Never done  . MAMMOGRAM  11/17/2015  . PAP SMEAR-Modifier  10/20/2016  . COLONOSCOPY  12/14/2019  . INFLUENZA VACCINE  Never done    There are no preventive  care reminders to display for this patient.   Lab Results  Component Value Date   TSH 1.06 08/30/2019   Lab Results  Component Value Date   WBC 9.4 08/30/2019   HGB 14.8 08/30/2019   HCT 44.8 08/30/2019   MCV 91.2 08/30/2019   PLT 395.0 08/30/2019   Lab Results  Component Value Date   NA 136 08/30/2019   K 4.2 08/30/2019   CO2 26 08/30/2019   GLUCOSE 86 08/30/2019   BUN 18 08/30/2019   CREATININE 0.90 08/30/2019   BILITOT 0.3 08/30/2019   ALKPHOS 83 08/30/2019   AST 21 08/30/2019   ALT 20 08/30/2019   PROT 7.9 08/30/2019   ALBUMIN 4.5 08/30/2019   CALCIUM 10.0 08/30/2019   GFR 65.30 08/30/2019   Lab Results  Component Value Date   CHOL 218 (H) 08/30/2019   Lab Results  Component Value Date   HDL 34.70 (L) 08/30/2019   Lab Results  Component Value Date   LDLCALC 115 (H) 08/12/2016   Lab Results  Component Value Date   TRIG 360.0 (H) 08/30/2019   Lab Results  Component Value Date   CHOLHDL 6 08/30/2019   Lab Results  Component Value Date   HGBA1C 5.9 08/30/2019       Assessment & Plan:   Problem List Items Addressed This Visit    None    Visit Diagnoses    Blurred vision, bilateral    -  Primary   Relevant Orders   Antinuclear Antib (ANA)   Basic Metabolic Panel (BMET)   TSH   CBC   Dry mouth       Relevant Orders   Antinuclear Antib (ANA)   Basic Metabolic Panel (BMET)   TSH   Acute pain of left knee       Relevant Orders   DG Knee Complete 4 Views Left   Pain and swelling of left knee       Relevant Orders   DG Knee Complete 4 Views Left       No orders of the defined types were placed in this encounter.    Kennyth Arnold, FNP

## 2020-06-21 ENCOUNTER — Other Ambulatory Visit: Payer: Self-pay | Admitting: Family

## 2020-06-21 DIAGNOSIS — M7042 Prepatellar bursitis, left knee: Secondary | ICD-10-CM

## 2020-06-21 LAB — CBC
HCT: 42.9 % (ref 36.0–46.0)
Hemoglobin: 14.4 g/dL (ref 12.0–15.0)
MCHC: 33.5 g/dL (ref 30.0–36.0)
MCV: 89.2 fl (ref 78.0–100.0)
Platelets: 407 10*3/uL — ABNORMAL HIGH (ref 150.0–400.0)
RBC: 4.81 Mil/uL (ref 3.87–5.11)
RDW: 13.3 % (ref 11.5–15.5)
WBC: 9.5 10*3/uL (ref 4.0–10.5)

## 2020-06-21 LAB — BASIC METABOLIC PANEL
BUN: 17 mg/dL (ref 6–23)
CO2: 27 mEq/L (ref 19–32)
Calcium: 10.2 mg/dL (ref 8.4–10.5)
Chloride: 101 mEq/L (ref 96–112)
Creatinine, Ser: 0.95 mg/dL (ref 0.40–1.20)
GFR: 67.88 mL/min (ref >60.00)
Glucose, Bld: 88 mg/dL (ref 70–99)
Potassium: 4.2 mEq/L (ref 3.5–5.1)
Sodium: 138 mEq/L (ref 135–145)

## 2020-06-21 LAB — TSH: TSH: 0.97 u[IU]/mL (ref 0.35–4.50)

## 2020-06-22 LAB — ANA: Anti Nuclear Antibody (ANA): NEGATIVE

## 2020-06-26 ENCOUNTER — Ambulatory Visit: Payer: 59 | Admitting: Orthopedic Surgery

## 2020-07-05 ENCOUNTER — Ambulatory Visit (INDEPENDENT_AMBULATORY_CARE_PROVIDER_SITE_OTHER): Payer: 59 | Admitting: Orthopedic Surgery

## 2020-07-05 ENCOUNTER — Encounter: Payer: Self-pay | Admitting: Orthopedic Surgery

## 2020-07-05 VITALS — Ht 69.0 in | Wt 198.0 lb

## 2020-07-05 DIAGNOSIS — M25462 Effusion, left knee: Secondary | ICD-10-CM | POA: Diagnosis not present

## 2020-07-05 DIAGNOSIS — M25562 Pain in left knee: Secondary | ICD-10-CM

## 2020-07-05 DIAGNOSIS — G8929 Other chronic pain: Secondary | ICD-10-CM

## 2020-07-06 MED ORDER — TRAMADOL HCL 50 MG PO TABS: 50.0000 mg | ORAL_TABLET | Freq: Two times a day (BID) | ORAL | 0 refills | Status: DC | PRN

## 2020-07-08 ENCOUNTER — Encounter: Payer: Self-pay | Admitting: Orthopedic Surgery

## 2020-07-08 NOTE — Progress Notes (Signed)
Office Visit Note   Patient: Anne Lewis           Date of Birth: Sep 27, 1965           MRN: [id number] Visit Date: 07/05/2020 Requested by: Kennyth Arnold, [place],  [place] [address] PCP: Libby Maw, MD  Subjective: Chief Complaint  Patient presents with  . Left Knee - Pain    HPI: Anne Lewis is a 54 y.o. female who presents to the office complaining of left knee pain.  Patient states that she fell 2 years ago off of a ramp and her knee "went out of place" and she had to "put it back in".  She has had pain on and off since that incident.  She localizes pain to the medial aspect of the left knee and states that pain travels from the anterior aspect of the posterior aspect of the medial side.  She has occasional lateral pain.  She has swelling that comes and goes and is worse at the end of the day.  She does note occasional instability or her knee gives out on her.  Denies any history of knee or back surgery.  Denies any numbness/tingling, groin pain.  She does note some low back pain on occasion.  Takes Aleve sparingly as she does have a history of gastric ulcers.  Sometimes she has to catch her breath due to the severe pain of the knee.  She had previous radiographs ordered by her PCP that were fairly unremarkable..                ROS: All systems reviewed are negative as they relate to the chief complaint within the history of present illness.  Patient denies fevers or chills.  Assessment & Plan: Visit Diagnoses:  1. Chronic pain of left knee     Plan: Patient is a 54 year old female who presents complaining of left knee pain.  She has had left knee pain for about 2 years since an injury where she fell off a ramp.  She has severe medial sided pain with swelling that comes and goes.  She does have occasional locking symptoms and instability of the left knee.  Pain is not improved over the course of 2 years and seems to be getting worse  recently.  With effusion and mechanical symptoms, plan to order MRI of the left knee to evaluate for medial meniscal tear.  Prescribe Ultram for symptomatic relief in the meantime.  Provided hinged knee brace.  Follow-up after MRI to review results.  Follow-Up Instructions: No follow-ups on file.   Orders:  Orders Placed This Encounter  Procedures  . MR Knee Left w/o contrast   Meds ordered this encounter  Medications  . traMADol (ULTRAM) 50 MG tablet    Sig: Take 1 tablet (50 mg total) by mouth every 12 (twelve) hours as needed.    Dispense:  30 tablet    Refill:  0      Procedures: No procedures performed   Clinical Data: No additional findings.  Objective: Vital Signs: Ht '5\' 9"'  (1.753 m)   Wt 198 lb (89.8 kg)   BMI 29.24 kg/m   Physical Exam:  Constitutional: Patient appears well-developed HEENT:  Head: Normocephalic Eyes:EOM are normal Neck: Normal range of motion Cardiovascular: Normal rate Pulmonary/chest: Effort normal Neurologic: Patient is alert Skin: Skin is warm Psychiatric: Patient has normal mood and affect  Ortho Exam: Ortho exam demonstrates left knee with trace effusion.  Moderate tenderness over the medial joint line of the left knee.  No significant tenderness over the lateral joint line.  No tenderness over the patella, quadricep tendon, patellar tendon.  Able to fully extend to 0 degrees and flex greater than 90 degrees though flexion is somewhat painful.  No ligamentous laxity with anterior/posterior drawer, Lachman exam, varus/valgus stress.  No calf tenderness.  Negative Homans' sign.  No pain with hip range of motion.  Able to perform straight leg raise.  Specialty Comments:  No specialty comments available.  Imaging: No results found.   PMFS History: Patient Active Problem List   Diagnosis Date Noted  . Paresthesias 08/30/2019  . Weight gain 08/30/2019  . Depression 08/30/2019  . Reactive airway disease 10/06/2018  . Osteoarthritis  of spine with radiculopathy, cervical region 04/16/2018  . Urticaria 04/16/2018  . Tooth pain 04/16/2018  . Acute non-recurrent sinusitis 08/19/2017  . Lateral epicondylitis of left elbow 08/19/2017  . Cough 08/19/2017  . Healthcare maintenance 08/19/2017  . Family history of malignant neoplasm of breast 11/08/2013  . Family history of malignant neoplasm of gastrointestinal tract 11/08/2013  . Family history of malignant neoplasm of ovary 11/08/2013  . RUQ PAIN 08/08/2010  . DYSPEPSIA&OTHER [facility] DISORDERS FUNCTION STOMACH 08/07/2010  . ABDOMINAL PAIN, EPIGASTRIC 08/07/2010  . OTHER CONSTIPATION 12/05/2009   Past Medical History:  Diagnosis Date  . BRCA negative 11/2013  . H pylori ulcer   . Ulcer of gastric fundus     Family History  Problem Relation Age of Onset  . Cancer Cousin 39       ovarian  . Cancer Cousin 54       breast  . Hypertension Mother   . Cancer Mother 39       intestational and ovarian  . Diverticulitis Mother   . Diabetes Father   . Hypertension Father   . Heart disease Father   . Cancer Sister 74       ovarian and intestional  . Breast cancer Maternal Aunt 60  . Cancer Brother 24       GI cancer  . Cancer Paternal Aunt 70       stomach  . Cancer Maternal Grandmother 19       ovarian and pancreatic  . Cancer Maternal Grandfather 82       pancreatic    Past Surgical History:  Procedure Laterality Date  . CESAREAN SECTION  [id number]  . ENDOMETRIAL CRYOABLATION  12.19.2012   HER OPTION - office performed  . TONSILLECTOMY  1987   Social History   Occupational History  . Not on file  Tobacco Use  . Smoking status: Current Some Day Smoker    Types: Cigarettes  . Smokeless tobacco: Never Used  . Tobacco comment: occ cigarette when she goes our for drinks. otherwise no.   Vaping Use  . Vaping Use: Never used  Substance and Sexual Activity  . Alcohol use: Yes    Comment: occ  . Drug use: No  . Sexual activity: Not on file

## 2020-07-09 ENCOUNTER — Encounter: Payer: Self-pay | Admitting: Orthopedic Surgery

## 2020-07-31 ENCOUNTER — Telehealth: Payer: Self-pay | Admitting: Family Medicine

## 2020-07-31 NOTE — Telephone Encounter (Signed)
Patient tested positive for COVID. She has a fever, body aches, and headaches. She wants to know what else she can do regarding her symptoms. Please call her at [phone] and advise.

## 2020-08-02 ENCOUNTER — Telehealth: Payer: Self-pay

## 2020-08-02 ENCOUNTER — Other Ambulatory Visit: Payer: 59

## 2020-08-02 NOTE — Telephone Encounter (Signed)
Alternate tylenol 500mg  2 tabs every 4-6hrs and ibuprofen 400-600mg  every 6-8 hrs Increase water intake mucinex 1 tab 2x/day Vit c 500mg  daily Vit D 1000mg  daily Zinc 50-75mg  daily Rest

## 2020-08-02 NOTE — Telephone Encounter (Signed)
Patient notified and verbalized understanding. 

## 2020-08-02 NOTE — Telephone Encounter (Signed)
Pt states she tested positive for COVID on Sunday and she has been taking otc medication (robitussin) to help with the cough along with tylenol but nothing seems to be helping or keeping her fever away so she would like to know what else she can do for the body aches, congestion, cough and night sweats.

## 2020-08-11 ENCOUNTER — Encounter: Payer: Self-pay | Admitting: Family Medicine

## 2020-08-11 ENCOUNTER — Telehealth (INDEPENDENT_AMBULATORY_CARE_PROVIDER_SITE_OTHER): Payer: 59 | Admitting: Family Medicine

## 2020-08-11 VITALS — Ht 69.0 in

## 2020-08-11 DIAGNOSIS — U071 COVID-19: Secondary | ICD-10-CM | POA: Diagnosis not present

## 2020-08-11 MED ORDER — BENZONATATE 200 MG PO CAPS: 200.0000 mg | ORAL_CAPSULE | Freq: Two times a day (BID) | ORAL | 0 refills | Status: DC | PRN

## 2020-08-11 NOTE — Patient Instructions (Signed)

## 2020-08-11 NOTE — Progress Notes (Signed)
Virtual Video Visit  Virtual Visit via Video Note  I connected with Anne Lewis on 08/11/20 at  1:00 PM EST by a video enabled telemedicine application and verified that I am speaking with the correct person using two identifiers.  Location: Patient: Home Provider: Clinic  Persons participating in the virtual visit: patient, provider    I discussed the limitations of evaluation and management by telemedicine and the availability of in person appointments. The patient expressed understanding and agreed to proceed.   Subjective:  Patient ID: Anne Lewis, female    DOB: [birth year]  Age: 55 y.o. MRN: [id number]  CC:  Chief Complaint  Patient presents with  . Covid Positive    Patient positive for covid last week, took another test this past Tuesday c/o back pains and chest pains when breathing in or talking. OTC medications not helping. Patient not able to be seen at urgent care ED has 22 hour wait. Little cough that come and go.     HPI Anne Lewis presents for having first developed symptoims of headache, fever, and myalgias on 07/29/2020. The next day, she conducted a home COVID test, which was positive. Her symptoms progressed over the next week to include sore throat and nasal congestion. However, she has not noted significant cough. She was contacted by the clinic on 08/02/2020 and advised to alternate the use of Tylenol and Aleve, and use Mucinex for symptom management. By 08/08/2020, she had a COVID test run by [employer] in preparation to consider returning to work. This has returned positive (she also had a negative Influenza A/B test). Anne Lewis notes that she has had a return of many of her symptoms which had been improving, esp. chest tightness/discomfort and congestion.  Past Medical History:  Diagnosis Date  . BRCA negative 11/2013  . H pylori ulcer   . Ulcer of gastric fundus     Past Surgical History:  Procedure Laterality Date  . CESAREAN  SECTION  [id number]  . ENDOMETRIAL CRYOABLATION  12.19.2012   HER OPTION - office performed  . TONSILLECTOMY  1987    Social History   Socioeconomic History  . Marital status: Divorced    Spouse name: Not on file  . Number of children: Not on file  . Years of education: Not on file  . Highest education level: Not on file  Occupational History  . Not on file  Tobacco Use  . Smoking status: Current Some Day Smoker    Types: Cigarettes  . Smokeless tobacco: Never Used  . Tobacco comment: occ cigarette when she goes our for drinks. otherwise no.   Vaping Use  . Vaping Use: Never used  Substance and Sexual Activity  . Alcohol use: Yes    Comment: occ  . Drug use: No  . Sexual activity: Not on file  Other Topics Concern  . Not on file  Social History Narrative  . Not on file   Social Determinants of Health   Financial Resource Strain: Not on file  Food Insecurity: Not on file  Transportation Needs: Not on file  Physical Activity: Not on file  Stress: Not on file  Social Connections: Not on file  Intimate Partner Violence: Not on file    Outpatient Medications Prior to Visit  Medication Sig Dispense Refill  . acetaminophen (TYLENOL) 160 MG/5ML elixir Take 325 mg by mouth every 4 (four) hours as needed for fever.    . Vitamin D, Ergocalciferol, (DRISDOL) 1.25 MG (50000 UNIT) CAPS capsule  Take 1 capsule (50,000 Units total) by mouth every 7 (seven) days. 5 capsule 5  . benzonatate (TESSALON) 200 MG capsule Take 1 capsule (200 mg total) by mouth 2 (two) times daily as needed for cough. 20 capsule 0  . cetirizine (ZYRTEC) 10 MG tablet Take 1 tablet (10 mg total) by mouth at bedtime. As needed for hives (Patient not taking: No sig reported) 30 tablet 0  . cyclobenzaprine (FLEXERIL) 10 MG tablet Take 1 tablet (10 mg total) by mouth 3 (three) times daily as needed for muscle spasms. (Patient not taking: Reported on 08/11/2020) 15 tablet 0  . DULoxetine (CYMBALTA) 30 MG capsule TAKE  1 CAPSULE (30 MG TOTAL) BY MOUTH DAILY. IN THE MORNING. (Patient not taking: No sig reported) 30 capsule 1  . fluticasone (FLONASE) 50 MCG/ACT nasal spray Place 1 spray into both nostrils daily. (Patient not taking: No sig reported) 11.1 g 2  . guaifenesin (ROBITUSSIN) 100 MG/5ML syrup Take 5-10 mLs (100-200 mg total) by mouth every 4 (four) hours as needed for cough. (Patient not taking: No sig reported) 60 mL 0  . hydrOXYzine (ATARAX/VISTARIL) 25 MG tablet TAKE 1 TABLET (25 MG TOTAL) BY MOUTH AT BEDTIME AS NEEDED. (Patient not taking: Reported on 08/11/2020) 30 tablet 1  . methocarbamol (ROBAXIN) 500 MG tablet Take 1 tablet (500 mg total) by mouth 3 (three) times daily. (Patient not taking: No sig reported) 90 tablet 0  . predniSONE (DELTASONE) 20 MG tablet Take 3 tablets (60 mg total) by mouth daily. (Patient not taking: No sig reported) 12 tablet 0  . traMADol (ULTRAM) 50 MG tablet Take 1 tablet (50 mg total) by mouth every 12 (twelve) hours as needed. (Patient not taking: Reported on 08/11/2020) 30 tablet 0   No facility-administered medications prior to visit.    Allergies  Allergen Reactions  . Aspirin Swelling and Other (See Comments)    Bleeding  . Augmentin [Amoxicillin-Pot Clavulanate] Swelling  . Oxycodone Nausea And Vomiting   ROS Review of Systems  HENT: Positive for nosebleeds.   Neurological: Positive for dizziness.  Psychiatric/Behavioral: Negative for confusion.  Also notes altered sense of smell, with a strong ammonia smell present GU: Urine appears dark and less frequent   Objective/Observation:     General: No distress. HEENT: No sneezing, or sniffling noted Lungs: No tachypnea or observed dyspnea. No sign of accessory muscle use. Skin: No visible cyanosis. Psych: Normal mood and affect.  Health Maintenance Due  Topic Date Due  . Hepatitis C Screening  Never done  . HIV Screening  Never done  . MAMMOGRAM  11/17/2015  . PAP SMEAR-Modifier  10/20/2016  .  COLONOSCOPY (Pts 45-49yr Insurance coverage will need to be confirmed)  12/14/2019  . INFLUENZA VACCINE  Never done     Assessment & Plan:   1) COVID- Anne Lewis tested positive for COVID -19 infection. She is 13 days out from her symptom onset. She is not exhibiting dypsnea or mental confusion. She does potentially have some mild dehydration. Patient does not have high risk conditions that would indicate need for other COVID therapies.  -  Recommend continue alternating Tylenol and Aleve. -  Push fluids. -  Try prone positioning while sleeping. -  Consider smoking cessation. -  Symptoms may persist for 4-6 weeks from onset. Seek urgent or emergent care if you develop shortness of breath, mental confusion, or stop producing urine.  Meds ordered this encounter  Medications  . benzonatate (TESSALON) 200 MG capsule  Sig: Take 1 capsule (200 mg total) by mouth 2 (two) times daily as needed for cough.    Dispense:  20 capsule    Refill:  0   Follow Up Instructions:  I discussed the assessment and treatment plan with the patient. The patient was provided an opportunity to ask questions and all were answered. The patient agreed with the plan and demonstrated an understanding of the instructions.   The patient was advised to call back or seek an in-person evaluation if the symptoms worsen or if the condition fails to improve as anticipated.  Follow-up: 1 week for reassessment.   Haydee Salter, MD

## 2020-08-15 ENCOUNTER — Other Ambulatory Visit: Payer: 59

## 2020-08-16 ENCOUNTER — Telehealth: Payer: Self-pay | Admitting: Family Medicine

## 2020-08-16 NOTE — Telephone Encounter (Signed)
Pt called and said that she is still having covid symptoms and that she is still testing positive, she said that her job is on the line and she doesn't know what to do, She requested a call back from either the nurse or doctor. Please advise. Pt number [phone]

## 2020-08-22 ENCOUNTER — Telehealth (INDEPENDENT_AMBULATORY_CARE_PROVIDER_SITE_OTHER): Payer: 59 | Admitting: Family Medicine

## 2020-08-22 DIAGNOSIS — R0789 Other chest pain: Secondary | ICD-10-CM | POA: Diagnosis not present

## 2020-08-22 DIAGNOSIS — R519 Headache, unspecified: Secondary | ICD-10-CM

## 2020-08-22 DIAGNOSIS — R059 Cough, unspecified: Secondary | ICD-10-CM | POA: Diagnosis not present

## 2020-08-22 DIAGNOSIS — R0981 Nasal congestion: Secondary | ICD-10-CM

## 2020-08-22 MED ORDER — BENZONATATE 100 MG PO CAPS: 100.0000 mg | ORAL_CAPSULE | Freq: Three times a day (TID) | ORAL | 0 refills | Status: DC | PRN

## 2020-08-22 MED ORDER — DOXYCYCLINE HYCLATE 100 MG PO TABS: 100.0000 mg | ORAL_TABLET | Freq: Two times a day (BID) | ORAL | 0 refills | Status: DC

## 2020-08-22 NOTE — Progress Notes (Signed)
Virtual Visit via Video Note  I connected with Anne Lewis  on 08/22/20 at  1:20 PM EST by a video enabled telemedicine application and verified that I am speaking with the correct person using two identifiers.  Location patient: home, [place] Location provider:work or home office Persons participating in the virtual visit: patient, provider  I discussed the limitations of evaluation and management by telemedicine and the availability of in person appointments. The patient expressed understanding and agreed to proceed.   HPI:  Acute telemedicine visit for COVID19: -Onset: 07/30/20 - positive covid test -Symptoms include: headaches, nasal congestion body aches, fevers, chest tightness at times, cough - she has had prolonged symptoms and tested positive again for COVID19 on the 18th and the 25th (PCR test) -today still having sinus discomfort, congestion, thick sinus drainage, sinus discomfort, intermittent chest discomfort -she did another covid test today (PCR) pending -Denies: CP, SOB, NVD, fevers -Has tried:musinex, tylenol -Pertinent past medical history: did smoke in the past -Pertinent medication allergies: asa, Augmentin, oxycodone -COVID-19 vaccine status: fully vaccinated, did not have the booster  ROS: See pertinent positives and negatives per HPI.  Past Medical History:  Diagnosis Date  . BRCA negative 11/2013  . H pylori ulcer   . Ulcer of gastric fundus     Past Surgical History:  Procedure Laterality Date  . CESAREAN SECTION  [id number]  . ENDOMETRIAL CRYOABLATION  12.19.2012   HER OPTION - office performed  . TONSILLECTOMY  1987     Current Outpatient Medications:  .  benzonatate (TESSALON PERLES) 100 MG capsule, Take 1 capsule (100 mg total) by mouth 3 (three) times daily as needed., Disp: 20 capsule, Rfl: 0 .  doxycycline (VIBRA-TABS) 100 MG tablet, Take 1 tablet (100 mg total) by mouth 2 (two) times daily., Disp: 20 tablet, Rfl: 0 .  acetaminophen (TYLENOL) 160  MG/5ML elixir, Take 325 mg by mouth every 4 (four) hours as needed for fever., Disp: , Rfl:  .  cetirizine (ZYRTEC) 10 MG tablet, Take 1 tablet (10 mg total) by mouth at bedtime. As needed for hives (Patient not taking: No sig reported), Disp: 30 tablet, Rfl: 0 .  cyclobenzaprine (FLEXERIL) 10 MG tablet, Take 1 tablet (10 mg total) by mouth 3 (three) times daily as needed for muscle spasms. (Patient not taking: Reported on 08/11/2020), Disp: 15 tablet, Rfl: 0 .  DULoxetine (CYMBALTA) 30 MG capsule, TAKE 1 CAPSULE (30 MG TOTAL) BY MOUTH DAILY. IN THE MORNING. (Patient not taking: No sig reported), Disp: 30 capsule, Rfl: 1 .  fluticasone (FLONASE) 50 MCG/ACT nasal spray, Place 1 spray into both nostrils daily. (Patient not taking: No sig reported), Disp: 11.1 g, Rfl: 2 .  guaifenesin (ROBITUSSIN) 100 MG/5ML syrup, Take 5-10 mLs (100-200 mg total) by mouth every 4 (four) hours as needed for cough. (Patient not taking: No sig reported), Disp: 60 mL, Rfl: 0 .  hydrOXYzine (ATARAX/VISTARIL) 25 MG tablet, TAKE 1 TABLET (25 MG TOTAL) BY MOUTH AT BEDTIME AS NEEDED. (Patient not taking: Reported on 08/11/2020), Disp: 30 tablet, Rfl: 1 .  methocarbamol (ROBAXIN) 500 MG tablet, Take 1 tablet (500 mg total) by mouth 3 (three) times daily. (Patient not taking: No sig reported), Disp: 90 tablet, Rfl: 0 .  predniSONE (DELTASONE) 20 MG tablet, Take 3 tablets (60 mg total) by mouth daily. (Patient not taking: No sig reported), Disp: 12 tablet, Rfl: 0 .  traMADol (ULTRAM) 50 MG tablet, Take 1 tablet (50 mg total) by mouth every 12 (twelve) hours as  needed. (Patient not taking: Reported on 08/11/2020), Disp: 30 tablet, Rfl: 0 .  Vitamin D, Ergocalciferol, (DRISDOL) 1.25 MG (50000 UNIT) CAPS capsule, Take 1 capsule (50,000 Units total) by mouth every 7 (seven) days., Disp: 5 capsule, Rfl: 5  EXAM:  VITALS per patient if applicable:  GENERAL: alert, oriented, appears well and in no acute distress  HEENT: atraumatic,  conjunttiva clear, no obvious abnormalities on inspection of external nose and ears  NECK: normal movements of the head and neck  LUNGS: on inspection no signs of respiratory distress, breathing rate appears normal, no obvious gross SOB, gasping or wheezing  CV: no obvious cyanosis  MS: moves all visible extremities without noticeable abnormality  PSYCH/NEURO: pleasant and cooperative, no obvious depression or anxiety, speech and thought processing grossly intact  ASSESSMENT AND PLAN:  Discussed the following assessment and plan:  Sinus congestion  Cough  Facial discomfort  Chest discomfort  -we discussed possible serious and likely etiologies, options for evaluation and workup, limitations of telemedicine visit vs in person visit, treatment, treatment risks and precautions. Pt prefers to treat via telemedicine empirically rather than in person at this moment.  However, given the chest discomfort and recent Covid illness, I recommended an in person evaluation.  She  was trying to avoid going to the hospital nor to in person care and her primary care office is not available.  Advised of other options, including urgent care, and she is considering going today and agrees she will definitely go if any worsening or not improving promptly.  She opted to try empiric treatment doxycycline in case of secondary sinusitis or CAP along with Tessalon for cough.  Did discuss with her that the PCR test can remain positive for some time after Covid illness and that she should stop checking a PCR test.  Advised an antigen test is a better test to check to see if she is still contagious, however that is unlikely given the onset of her Covid illness. Work/School slipped offered: She is very active job and given chest discomfort, advised in person evaluation before returning to work note is given. Did let this patient know that I only do telemedicine on Tuesdays and Thursdays for [facility]. Advised to schedule  follow up visit with PCP or [facility] if any further questions or concerns to avoid delays in care.   I discussed the assessment and treatment plan with the patient. The patient was provided an opportunity to ask questions and all were answered. The patient agreed with the plan and demonstrated an understanding of the instructions.     Lucretia Kern, DO

## 2020-08-22 NOTE — Patient Instructions (Addendum)
-  I sent the medication(s) we discussed to your pharmacy: Meds ordered this encounter  Medications  . doxycycline (VIBRA-TABS) 100 MG tablet    Sig: Take 1 tablet (100 mg total) by mouth 2 (two) times daily.    Dispense:  20 tablet    Refill:  0  . benzonatate (TESSALON PERLES) 100 MG capsule    Sig: Take 1 capsule (100 mg total) by mouth 3 (three) times daily as needed.    Dispense:  20 capsule    Refill:  0    While this may help if you have a sinus infection, I do recommend in person evaluation for the intermittent issues you are having with your chest.  Would advise going to urgent care clinic promptly if your primary care office is not able to see you in person.  PCR testing can remain positive for some time after Covid infection.  Would not advise repeating this.  An antigen test would be a more helpful test to determine if you had a reinfection with Covid.  I hope you are feeling better soon!  It was nice to meet you today. I help [facility] out with telemedicine visits on Tuesdays and Thursdays and am available for visits on those days. If you have any concerns or questions following this visit please schedule a follow up visit with your Primary Care doctor or seek care at a local urgent care clinic to avoid delays in care.

## 2020-08-23 ENCOUNTER — Emergency Department (HOSPITAL_BASED_OUTPATIENT_CLINIC_OR_DEPARTMENT_OTHER): Payer: 59

## 2020-08-23 ENCOUNTER — Encounter (HOSPITAL_BASED_OUTPATIENT_CLINIC_OR_DEPARTMENT_OTHER): Payer: Self-pay

## 2020-08-23 ENCOUNTER — Other Ambulatory Visit: Payer: Self-pay

## 2020-08-23 ENCOUNTER — Emergency Department (HOSPITAL_BASED_OUTPATIENT_CLINIC_OR_DEPARTMENT_OTHER)
Admission: EM | Admit: 2020-08-23 | Discharge: 2020-08-23 | Disposition: A | Discharge status: Home / Self Care | Payer: 59 | Attending: Emergency Medicine | Admitting: Emergency Medicine

## 2020-08-23 DIAGNOSIS — R0789 Other chest pain: Secondary | ICD-10-CM | POA: Insufficient documentation

## 2020-08-23 DIAGNOSIS — J019 Acute sinusitis, unspecified: Secondary | ICD-10-CM | POA: Diagnosis not present

## 2020-08-23 DIAGNOSIS — J069 Acute upper respiratory infection, unspecified: Secondary | ICD-10-CM | POA: Diagnosis not present

## 2020-08-23 DIAGNOSIS — Z8616 Personal history of COVID-19: Secondary | ICD-10-CM | POA: Diagnosis not present

## 2020-08-23 DIAGNOSIS — F17210 Nicotine dependence, cigarettes, uncomplicated: Secondary | ICD-10-CM | POA: Insufficient documentation

## 2020-08-23 LAB — BASIC METABOLIC PANEL
Anion gap: 11 (ref 5–15)
BUN: 24 mg/dL — ABNORMAL HIGH (ref 6–20)
CO2: 24 mmol/L (ref 22–32)
Calcium: 9.5 mg/dL (ref 8.9–10.3)
Chloride: 100 mmol/L (ref 98–111)
Creatinine, Ser: 0.85 mg/dL (ref 0.44–1.00)
GFR, Estimated: >60 mL/min (ref >60)
Glucose, Bld: 122 mg/dL — ABNORMAL HIGH (ref 70–99)
Potassium: 4.1 mmol/L (ref 3.5–5.1)
Sodium: 135 mmol/L (ref 135–145)

## 2020-08-23 LAB — CBC
HCT: 45.7 % (ref 36.0–46.0)
Hemoglobin: 14.9 g/dL (ref 12.0–15.0)
MCH: 29.9 pg (ref 26.0–34.0)
MCHC: 32.6 g/dL (ref 30.0–36.0)
MCV: 91.6 fL (ref 80.0–100.0)
Platelets: 475 10*3/uL — ABNORMAL HIGH (ref 150–400)
RBC: 4.99 MIL/uL (ref 3.87–5.11)
RDW: 12.8 % (ref 11.5–15.5)
WBC: 10.6 10*3/uL — ABNORMAL HIGH (ref 4.0–10.5)
nRBC: 0 % (ref 0.0–0.2)

## 2020-08-23 LAB — PREGNANCY, URINE: Preg Test, Ur: NEGATIVE

## 2020-08-23 LAB — TROPONIN I (HIGH SENSITIVITY)
Troponin I (High Sensitivity): <2 ng/L (ref <18)
Troponin I (High Sensitivity): <2 ng/L (ref <18)

## 2020-08-23 MED ORDER — PANTOPRAZOLE SODIUM 20 MG PO TBEC: 20.0000 mg | DELAYED_RELEASE_TABLET | Freq: Every day | ORAL | 0 refills | Status: DC

## 2020-08-23 NOTE — ED Triage Notes (Addendum)
Pt reports she tested +covid 1/9 and +covid again 1/24-states she has been having CP, back pain and HA since 1/9-states she has not sought medical care with c/o CP-NAD-steady gait

## 2020-08-23 NOTE — ED Provider Notes (Addendum)
[facility]PART[facility] Provider Note   CSN: [id number] Arrival date & time: 08/23/20  1123     History Chief Complaint  Patient presents with  . Chest Pain    Anne Lewis is a 55 y.o. female history includes stomach ulcers, reactive airway disease.  Patient arrives for evaluation of multiple concerns including body aches, sinus pressure, chest pressure.  Patient reports that she tested positive for COVID-19 on July 30, 2020, she reports she is vaccinated x2.  She reports that on January 9 she developed body aches, fatigue, nonproductive cough and chest pressure.  She reports the chest pressure has been constant since onset mild central nonradiating improved with going to bed and with NyQuil, somewhat aggravated by a warm shower.  Patient reports that she felt her symptoms had been initially improving but never truly resolved, over the past 1 week symptoms have worsened and her body aches have gotten worse as well.  She reports that she has had increasing sinus pressure sensation to both her maxillary and frontal sinuses.  She reports she called her primary care provider yesterday and was prescribed Tessalon and doxycycline for symptoms but has not yet started taking it and wanted a in person evaluation first.  Of note patient reports she quit smoking 1 year ago.  Denies recurrence of fever, denies vision changes, neck stiffness, sore throat, hemoptysis, history of blood clot, abdominal pain, vomiting, diarrhea, extremity swelling/color change, pleurisy, exertional chest pain, recent surgery/immobilization, family history of heart disease under the age of 38, personal history ofd diabetes/hypertension/hyperlipidemia, she also denies personal history of heart disease or any additional concerns.  HPI     Past Medical History:  Diagnosis Date  . BRCA negative 11/2013  . H pylori ulcer   . Ulcer of gastric fundus     Patient Active Problem List   Diagnosis Date  Noted  . Paresthesias 08/30/2019  . Weight gain 08/30/2019  . Depression 08/30/2019  . Reactive airway disease 10/06/2018  . 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 Urticaria 04/16/2018  . Tooth pain 04/16/2018  . Acute non-recurrent sinusitis 08/19/2017  . Lateral epicondylitis of left elbow 08/19/2017  . Cough 08/19/2017  . Healthcare maintenance 08/19/2017  . Family history of malignant neoplasm of breast 11/08/2013  . Family history of malignant neoplasm of gastrointestinal tract 11/08/2013  . Family history of malignant neoplasm of ovary 11/08/2013  . RUQ PAIN 08/08/2010  . DYSPEPSIA&OTHER [facility] DISORDERS FUNCTION STOMACH 08/07/2010  . ABDOMINAL PAIN, EPIGASTRIC 08/07/2010  . OTHER CONSTIPATION 12/05/2009    Past Surgical History:  Procedure Laterality Date  . CESAREAN SECTION  [id number]  . ENDOMETRIAL CRYOABLATION  12.19.2012   HER OPTION - office performed  . TONSILLECTOMY  1987     OB History    Gravida  3   Para  3   Term      Preterm      AB      Living  3     SAB      IAB      Ectopic      Multiple      Live Births              Family History  Problem Relation Age of Onset  . Cancer Cousin 39       ovarian  . Cancer Cousin 110       breast  . Hypertension Mother   . Cancer Mother 89  intestational and ovarian  . Diverticulitis Mother   . Diabetes Father   . Hypertension Father   . Heart disease Father   . Cancer Sister 39       ovarian and intestional  . Breast cancer Maternal Aunt 60  . Cancer Brother 24       GI cancer  . Cancer Paternal Aunt 26       stomach  . Cancer Maternal Grandmother 49       ovarian and pancreatic  . Cancer Maternal Grandfather 21       pancreatic    Social History   Tobacco Use  . Smoking status: Current Some Day Smoker    Types: Cigarettes  . Smokeless tobacco: Never Used  Vaping Use  . Vaping Use: Never used  Substance Use Topics  . Alcohol use: Yes     Comment: occ  . Drug use: No    Home Medications Prior to Admission medications   Medication Sig Start Date End Date Taking? Authorizing Provider  pantoprazole (PROTONIX) 20 MG tablet Take 1 tablet (20 mg total) by mouth daily. 08/23/20  Yes Nuala Alpha A, PA-C  acetaminophen (TYLENOL) 160 MG/5ML elixir Take 325 mg by mouth every 4 (four) hours as needed for fever.    [provider]  benzonatate (TESSALON PERLES) 100 MG capsule Take 1 capsule (100 mg total) by mouth 3 (three) times daily as needed. 08/22/20   Lucretia Kern, DO  cetirizine (ZYRTEC) 10 MG tablet Take 1 tablet (10 mg total) by mouth at bedtime. As needed for hives Patient not taking: No sig reported 04/16/18   Libby Maw, MD  cyclobenzaprine (FLEXERIL) 10 MG tablet Take 1 tablet (10 mg total) by mouth 3 (three) times daily as needed for muscle spasms. Patient not taking: Reported on 08/11/2020 10/23/19   Hayden Rasmussen, MD  doxycycline (VIBRA-TABS) 100 MG tablet Take 1 tablet (100 mg total) by mouth 2 (two) times daily. 08/22/20   Lucretia Kern, DO  DULoxetine (CYMBALTA) 30 MG capsule TAKE 1 CAPSULE (30 MG TOTAL) BY MOUTH DAILY. IN THE MORNING. Patient not taking: No sig reported 11/11/19   Libby Maw, MD  fluticasone [facility]) 50 MCG/ACT nasal spray Place 1 spray into both nostrils daily. Patient not taking: No sig reported 10/02/18   Henderly, Britni A, PA-C  guaifenesin (ROBITUSSIN) 100 MG/5ML syrup Take 5-10 mLs (100-200 mg total) by mouth every 4 (four) hours as needed for cough. Patient not taking: No sig reported 10/02/18   Henderly, Britni A, PA-C  hydrOXYzine (ATARAX/VISTARIL) 25 MG tablet TAKE 1 TABLET (25 MG TOTAL) BY MOUTH AT BEDTIME AS NEEDED. Patient not taking: Reported on 08/11/2020 11/11/19   Libby Maw, MD  methocarbamol (ROBAXIN) 500 MG tablet Take 1 tablet (500 mg total) by mouth 3 (three) times daily. Patient not taking: No sig reported 11/18/19   Gregor Hams, MD   predniSONE (DELTASONE) 20 MG tablet Take 3 tablets (60 mg total) by mouth daily. Patient not taking: No sig reported 10/23/19   Hayden Rasmussen, MD  traMADol (ULTRAM) 50 MG tablet Take 1 tablet (50 mg total) by mouth every 12 (twelve) hours as needed. Patient not taking: Reported on 08/11/2020 07/06/20   Magnant, Gerrianne Scale, PA-C  Vitamin D, Ergocalciferol, (DRISDOL) 1.25 MG (50000 UNIT) CAPS capsule Take 1 capsule (50,000 Units total) by mouth every 7 (seven) days. 08/31/19   Libby Maw, MD    Allergies    Aspirin,  Augmentin [amoxicillin-pot clavulanate], and Oxycodone  Review of Systems   Review of Systems Ten systems are reviewed and are negative for acute change except as noted in the HPI  Physical Exam Updated Vital Signs BP 129/83   Pulse 88   Temp 98.5 F (36.9 C) (Oral)   Resp 16   Ht '5\' 9"'  (1.753 m)   Wt 90.3 kg   SpO2 100%   BMI 29.39 kg/m   Physical Exam Constitutional:      General: She is not in acute distress.    Appearance: Normal appearance. She is well-developed. She is not ill-appearing or diaphoretic.  HENT:     Head: Normocephalic and atraumatic. No raccoon eyes or Battle's sign.     Jaw: There is normal jaw occlusion. No trismus.     Right Ear: Tympanic membrane and external ear normal.     Left Ear: Tympanic membrane and external ear normal.     Nose: Rhinorrhea present. Rhinorrhea is clear.     Right Sinus: Maxillary sinus tenderness and frontal sinus tenderness present.     Left Sinus: Maxillary sinus tenderness and frontal sinus tenderness present.     Mouth/Throat:     Comments: Mild posterior pharynx cobblestoning and postnasal drip.  The patient has normal phonation and is in control of secretions. No stridor.  Midline uvula without edema. Soft palate rises symmetrically. No tonsillar erythema, swelling or exudates. Tongue protrusion is normal, floor of mouth is soft. No trismus. No creptius on neck palpation. No gingival erythema or  fluctuance noted. Mucus membranes moist. No pallor noted. Eyes:     General: Vision grossly intact. Gaze aligned appropriately.     Extraocular Movements: Extraocular movements intact.     Conjunctiva/sclera: Conjunctivae normal.     Pupils: Pupils are equal, round, and reactive to light.  Neck:     Trachea: Trachea and phonation normal. No tracheal tenderness or tracheal deviation.     Meningeal: Brudzinski's sign absent.  Cardiovascular:     Rate and Rhythm: Normal rate and regular rhythm.     Pulses:          Dorsalis pedis pulses are 2+ on the right side and 2+ on the left side.     Heart sounds: Normal heart sounds.  Pulmonary:     Effort: Pulmonary effort is normal. No accessory muscle usage or respiratory distress.     Breath sounds: Normal breath sounds and air entry.  Abdominal:     General: There is no distension.     Palpations: Abdomen is soft.     Tenderness: There is no abdominal tenderness. There is no guarding or rebound.  Musculoskeletal:        General: Normal range of motion.     Cervical back: Normal range of motion and neck supple.     Right lower leg: No edema.     Left lower leg: No edema.  Skin:    General: Skin is warm and dry.  Neurological:     Mental Status: She is alert.     GCS: GCS eye subscore is 4. GCS verbal subscore is 5. GCS motor subscore is 6.     Comments: Speech is clear and goal oriented, follows commands Major Cranial nerves without deficit, no facial droop Moves extremities without ataxia, coordination intact  Psychiatric:        Behavior: Behavior normal.     ED Results / Procedures / Treatments   Labs (all labs ordered are listed, but  only abnormal results are displayed) Labs Reviewed  BASIC METABOLIC PANEL - Abnormal; Notable for the following components:      Result Value   Glucose, Bld 122 (*)    BUN 24 (*)    All other components within normal limits  CBC - Abnormal; Notable for the following components:   WBC 10.6 (*)     Platelets 475 (*)    All other components within normal limits  PREGNANCY, URINE  TROPONIN I (HIGH SENSITIVITY)  TROPONIN I (HIGH SENSITIVITY)    EKG EKG Interpretation  Date/Time:  Wednesday August 23 2020 11:26:08 EST Ventricular Rate:  97 PR Interval:  134 QRS Duration: 78 QT Interval:  346 QTC Calculation: 439 R Axis:   25 Text Interpretation: Normal sinus rhythm with sinus arrhythmia Right atrial enlargement Borderline ECG No significant change since last tracing Confirmed by Deno Etienne [phone]) on 08/23/2020 1:40:00 PM   Radiology DG Chest Portable 1 View  Result Date: 08/23/2020 CLINICAL DATA:  Chest pain, COVID positive EXAM: PORTABLE CHEST 1 VIEW COMPARISON:  10/02/2018 chest radiograph. FINDINGS: Stable cardiomediastinal silhouette with normal heart size. No pneumothorax. No pleural effusion. Lungs appear clear, with no acute consolidative airspace disease and no pulmonary edema. IMPRESSION: No active disease. Electronically Signed   By: Ilona Sorrel M.D.   On: 08/23/2020 11:54    Procedures Procedures   Medications Ordered in ED Medications - No data to display  ED Course  I have reviewed the triage vital signs and the nursing notes.  Pertinent labs & imaging results that were available during my care of the patient were reviewed by me and considered in my medical decision making (see chart for details).    MDM Rules/Calculators/A&P                         Additional history obtained from: 1. Nursing notes from this visit. 2. EMR review, patient prescribed doxycycline 100 mg twice daily x10 days as well as Tessalon 100 mg 3 times daily yesterday by PCP for sinus congestion, cough, facial discomfort chest discomfort. ------------------- I reviewed and interpreted labs which include: CBC shows mild leukocytosis of 10.6, no anemia. BMP shows no emergent electrolyte derangement, AKI or gap. Pregnancy test negative. High-sensitivity troponin negative x2  EKG:  Normal sinus rhythm with sinus arrhythmia Right atrial enlargement Borderline ECG No significant change since last tracing Confirmed by Deno Etienne [phone]) on 08/23/2020 1:40:00 PM  CXR:  IMPRESSION:  No active disease.   Heart score less than 4.  Vital signs are stable.  No tachycardia or hypoxia throughout ER stay.  Low suspicion for ACS, PE, dissection, bacterial pneumonia or other emergent pathologies.  Suspect patient symptoms today likely secondary to viral illness in the setting of COVID-19.  Patient was prescribed doxycycline and Tessalon by her PCP yesterday doxycycline appears to be for patient's sinusitis she does have some mild maxillary and frontal sinus pressure on exam but no purulent discharge.  I have encouraged patient to follow-up with her PCP for reevaluation she may take prescribed medications by her PCP and I have encouraged her to maintain water hydration, rest and OTC anti-inflammatories.  There is no indication for further work-up at this time.  Additional etiologies of patient's symptoms were considered, patient without abdominal pain nausea vomiting low suspicion for pancreatitis, cholecystitis, perforation or other emergent intra-abdominal process.  Patient does have a history of GERD and reports that she does not take a PPI, will start  patient on Protonix for possible acid reflux.   At this time there does not appear to be any evidence of an acute emergency medical condition and the patient appears stable for discharge with appropriate outpatient follow up. Diagnosis was discussed with patient who verbalizes understanding of care plan and is agreeable to discharge. I have discussed return precautions with patient and who verbalizes understanding. Patient encouraged to follow-up with their PCP. All questions answered.  Patient's case discussed with Dr. Tyrone Nine who agrees with plan to discharge with follow-up.   Anne Lewis was evaluated in Emergency Department on 08/23/2020 for  the symptoms described in the history of present illness. She was evaluated in the context of the global COVID-19 pandemic, which necessitated consideration that the patient might be at risk for infection with the SARS-CoV-2 virus that causes COVID-19. Institutional protocols and algorithms that pertain to the evaluation of patients at risk for COVID-19 are in a state of rapid change based on information released by regulatory bodies including the CDC and federal and state organizations. These policies and algorithms were followed during the patient's care in the ED.   Note: Portions of this report may have been transcribed using voice recognition software. Every effort was made to ensure accuracy; however, inadvertent computerized transcription errors may still be present. Final Clinical Impression(s) / ED Diagnoses Final diagnoses:  Atypical chest pain  Viral URI with cough  Acute sinusitis, recurrence not specified, unspecified location    Rx / DC Orders ED Discharge Orders         Ordered    Ambulatory referral for Covid Treatment        08/23/20 1507    pantoprazole (PROTONIX) 20 MG tablet  Daily        08/23/20 1511           Deliah Boston, PA-C 08/23/20 1513    Deliah Boston, PA-C 08/23/20 Solis, [place], DO 08/24/20 [phone]

## 2020-08-23 NOTE — ED Notes (Signed)
ED Provider at bedside. 

## 2020-08-23 NOTE — Discharge Instructions (Addendum)
At this time there does not appear to be the presence of an emergent medical condition, however there is always the potential for conditions to change. Please read and follow the below instructions.  Please return to the Emergency Department immediately for any new or worsening symptoms. Please be sure to follow up with your Primary Care Provider within one week regarding your visit today; please call their office to schedule an appointment even if you are feeling better for a follow-up visit. Please drink plenty water to avoid dehydration, please get plenty of rest.  You may use the medications doxycycline and Tessalon as prescribed by your primary care provider.  Go to the nearest Emergency Department immediately if: You have fever or chills You have a cough that gets worse, or you cough up blood. You have very bad (severe) pain in your belly (abdomen). You pass out (faint). You have either of these for no clear reason: Sudden chest discomfort. Sudden discomfort in your arms, back, neck, or jaw. You have shortness of breath at any time. You suddenly start to sweat, or your skin gets clammy. You feel sick to your stomach (nauseous). You throw up (vomit). You suddenly feel lightheaded or dizzy. You feel very weak or tired. Your heart starts to beat fast, or it feels like it is skipping beats. You have any new/concerning or worsening of symptoms   Please read the additional information packets attached to your discharge summary.  Do not take your medicine if  develop an itchy rash, swelling in your mouth or lips, or difficulty breathing; call 911 and seek immediate emergency medical attention if this occurs.  You may review your lab tests and imaging results in their entirety on your MyChart account.  Please discuss all results of fully with your primary care provider and other specialist at your follow-up visit.  Note: Portions of this text may have been transcribed using voice  recognition software. Every effort was made to ensure accuracy; however, inadvertent computerized transcription errors may still be present.

## 2020-12-27 ENCOUNTER — Other Ambulatory Visit: Payer: Self-pay

## 2020-12-27 ENCOUNTER — Encounter: Payer: Self-pay | Admitting: Family Medicine

## 2020-12-27 ENCOUNTER — Ambulatory Visit (INDEPENDENT_AMBULATORY_CARE_PROVIDER_SITE_OTHER): Payer: 59 | Admitting: Family Medicine

## 2020-12-27 VITALS — BP 118/80 | HR 83 | Temp 97.4°F | Ht 69.0 in | Wt 213.2 lb

## 2020-12-27 DIAGNOSIS — M5441 Lumbago with sciatica, right side: Secondary | ICD-10-CM

## 2020-12-27 MED ORDER — METHOCARBAMOL 500 MG PO TABS: 500.0000 mg | ORAL_TABLET | Freq: Three times a day (TID) | ORAL | 0 refills | Status: DC

## 2020-12-27 NOTE — Patient Instructions (Signed)

## 2020-12-27 NOTE — Progress Notes (Signed)
[facility] [address] [place] [address] Dept: [phone] Dept Fax: [phone]  Office Visit  Subjective:    Patient ID: Anne Lewis, female    DOB: 1966/03/31, 55 y.o..   MRN: [id number]  Chief Complaint  Patient presents with  . Acute Visit    C/o having low back pain radiating down RT leg after falling while trying to sit on the couch x  10 days.  She has been been taking the Methocarbamol, bio-breeze, aleve,  using ice/heat with little relief.     History of Present Illness:  Patient is in today with a 10-day history of low back pain and sciatica. She notes that she misjudged when going to sit on her couch and landed on her buttocks. Soon after she noted pain over her lower right back. Since then she has had some progressive pain down her buttocks and the back of the right thigh and calf.  She did return to work, as she felt she couldn't be off. Activity has helped a small bit in her pain. She has been using Aleve 2 tabs bid, Biofreeze, and heating pads. She had some Robaxin at home (this was out of date).  Past Medical History: Patient Active Problem List   Diagnosis Date Noted  . Paresthesias 08/30/2019  . Weight gain 08/30/2019  . Depression 08/30/2019  . Reactive airway disease 10/06/2018  . 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 Urticaria 04/16/2018  . Tooth pain 04/16/2018  . Acute non-recurrent sinusitis 08/19/2017  . Lateral epicondylitis of left elbow 08/19/2017  . Cough 08/19/2017  . Healthcare maintenance 08/19/2017  . Family history of malignant neoplasm of breast 11/08/2013  . Family history of malignant neoplasm of gastrointestinal tract 11/08/2013  . Family history of malignant neoplasm of ovary 11/08/2013  . RUQ PAIN 08/08/2010  . DYSPEPSIA&OTHER [facility] DISORDERS FUNCTION STOMACH 08/07/2010  . ABDOMINAL PAIN, EPIGASTRIC 08/07/2010  . OTHER CONSTIPATION  12/05/2009   Past Surgical History:  Procedure Laterality Date  . CESAREAN SECTION  [id number]  . ENDOMETRIAL CRYOABLATION  12.19.2012   HER OPTION - office performed  . TONSILLECTOMY  1987   Family History  Problem Relation Age of Onset  . Cancer Cousin 39       ovarian  . Cancer Cousin 46       breast  . Hypertension Mother   . Cancer Mother 24       intestational and ovarian  . Diverticulitis Mother   . Diabetes Father   . Hypertension Father   . Heart disease Father   . Cancer Sister 29       ovarian and intestional  . Breast cancer Maternal Aunt 60  . Cancer Brother 24       GI cancer  . Cancer Paternal Aunt 8       stomach  . Cancer Maternal Grandmother 62       ovarian and pancreatic  . Cancer Maternal Grandfather 82       pancreatic   Outpatient Medications Prior to Visit  Medication Sig Dispense Refill  . acetaminophen (TYLENOL) 160 MG/5ML elixir Take 325 mg by mouth every 4 (four) hours as needed for fever.    . Vitamin D, Ergocalciferol, (DRISDOL) 1.25 MG (50000 UNIT) CAPS capsule Take 1 capsule (50,000 Units total) by mouth every 7 (seven) days. 5 capsule 5  . methocarbamol (ROBAXIN) 500 MG tablet Take 1 tablet (500 mg total) by mouth 3 (three)  times daily. 90 tablet 0  . benzonatate (TESSALON PERLES) 100 MG capsule Take 1 capsule (100 mg total) by mouth 3 (three) times daily as needed. (Patient not taking: Reported on 12/27/2020) 20 capsule 0  . cetirizine (ZYRTEC) 10 MG tablet Take 1 tablet (10 mg total) by mouth at bedtime. As needed for hives (Patient not taking: No sig reported) 30 tablet 0  . cyclobenzaprine (FLEXERIL) 10 MG tablet Take 1 tablet (10 mg total) by mouth 3 (three) times daily as needed for muscle spasms. (Patient not taking: No sig reported) 15 tablet 0  . doxycycline (VIBRA-TABS) 100 MG tablet Take 1 tablet (100 mg total) by mouth 2 (two) times daily. (Patient not taking: Reported on 12/27/2020) 20 tablet 0  . DULoxetine (CYMBALTA) 30 MG capsule  TAKE 1 CAPSULE (30 MG TOTAL) BY MOUTH DAILY. IN THE MORNING. (Patient not taking: No sig reported) 30 capsule 1  . fluticasone (FLONASE) 50 MCG/ACT nasal spray Place 1 spray into both nostrils daily. (Patient not taking: No sig reported) 11.1 g 2  . guaifenesin (ROBITUSSIN) 100 MG/5ML syrup Take 5-10 mLs (100-200 mg total) by mouth every 4 (four) hours as needed for cough. (Patient not taking: No sig reported) 60 mL 0  . hydrOXYzine (ATARAX/VISTARIL) 25 MG tablet TAKE 1 TABLET (25 MG TOTAL) BY MOUTH AT BEDTIME AS NEEDED. (Patient not taking: No sig reported) 30 tablet 1  . pantoprazole (PROTONIX) 20 MG tablet Take 1 tablet (20 mg total) by mouth daily. (Patient not taking: Reported on 12/27/2020) 30 tablet 0  . predniSONE (DELTASONE) 20 MG tablet Take 3 tablets (60 mg total) by mouth daily. (Patient not taking: No sig reported) 12 tablet 0  . traMADol (ULTRAM) 50 MG tablet Take 1 tablet (50 mg total) by mouth every 12 (twelve) hours as needed. (Patient not taking: No sig reported) 30 tablet 0   No facility-administered medications prior to visit.   Allergies  Allergen Reactions  . Aspirin Swelling and Other (See Comments)    Bleeding  . Augmentin [Amoxicillin-Pot Clavulanate] Swelling  . Oxycodone Nausea And Vomiting     Objective:   Today's Vitals   12/27/20 1139  BP: 118/80  Pulse: 83  Temp: (!) 97.4 F (36.3 C)  TempSrc: Temporal  SpO2: 97%  Weight: 213 lb 3.2 oz (96.7 kg)  Height: 5\' 9"  (1.753 m)   Body mass index is 31.48 kg/m.   General: Well developed, well nourished. No acute distress. Back: Straight. Mild pain over the midline near the lumbosacral area. Also mild tenderness over   the right paraspinal muscles in this same area. Extremities: Mild positive SLR on the right. Strength 5/5. DTR + bilat. Sensation normal.  Psych: Alert and oriented. Normal mood and affect.  Health Maintenance Due  Topic Date Due  . Pneumococcal Vaccine 66-31 Years old (1 of 2 - PPSV23) Never  done  . HIV Screening  Never done  . Hepatitis C Screening  Never done  . MAMMOGRAM  11/17/2015  . Zoster Vaccines- Shingrix (1 of 2) Never done  . PAP SMEAR-Modifier  10/20/2016  . COLONOSCOPY (Pts 45-84yrs Insurance coverage will need to be confirmed)  12/14/2019  . COVID-19 Vaccine (3 - Booster for Pfizer series) 07/23/2020   Assessment & Plan:   1. Acute right-sided low back pain with right-sided sciatica Discussed her trying to remain active. Using heat and stretches for her back. Continue Aleve as needed. I will renew her methocarbamol. If symptoms are not improving in 1 week,  I would consider a course of steroids. She will contact me if not improved.  - methocarbamol (ROBAXIN) 500 MG tablet; Take 1 tablet (500 mg total) by mouth 3 (three) times daily.  Dispense: 30 tablet; Refill: 0  Haydee Salter, MD

## 2021-01-03 ENCOUNTER — Telehealth: Payer: Self-pay

## 2021-01-03 DIAGNOSIS — M4722 Other spondylosis with radiculopathy, cervical region: Secondary | ICD-10-CM

## 2021-01-03 MED ORDER — PREDNISONE 20 MG PO TABS: 20.0000 mg | ORAL_TABLET | Freq: Every day | ORAL | 0 refills | Status: DC

## 2021-01-03 NOTE — Telephone Encounter (Signed)
Patient called back ater last OV on 12/27/20 stating that she has been doing everything advised and is still having back pain that is radiating down her leg.    Please review and advise.  Thanks. Dm/cma

## 2021-01-03 NOTE — Addendum Note (Signed)
Addended by: Haydee Salter on: 01/03/2021 06:18 PM   Modules accepted: Orders

## 2021-01-04 NOTE — Telephone Encounter (Signed)
Lft detailed message that RX was sent to the pharmacy. If any questions please call us back. Dm/cma

## 2021-01-05 ENCOUNTER — Ambulatory Visit: Payer: 59 | Admitting: Family Medicine

## 2021-01-11 ENCOUNTER — Ambulatory Visit (INDEPENDENT_AMBULATORY_CARE_PROVIDER_SITE_OTHER): Payer: 59 | Admitting: Family Medicine

## 2021-01-11 ENCOUNTER — Other Ambulatory Visit: Payer: Self-pay

## 2021-01-11 DIAGNOSIS — M5431 Sciatica, right side: Secondary | ICD-10-CM | POA: Diagnosis not present

## 2021-01-11 MED ORDER — MELOXICAM 15 MG PO TABS: 15.0000 mg | ORAL_TABLET | Freq: Every day | ORAL | 0 refills | Status: DC

## 2021-01-11 MED ORDER — GABAPENTIN 300 MG PO CAPS: 300.0000 mg | ORAL_CAPSULE | Freq: Every day | ORAL | 1 refills | Status: DC

## 2021-01-11 NOTE — Progress Notes (Signed)
    SUBJECTIVE:   CHIEF COMPLAINT / HPI:   Anne Lewis is 55 year old female who presents with back pain   Back pain  Pt reports 1 month hx of lumbar back pain radiating to the right leg. This was following a fall when she had trying to sit on the couch and she missed. She ended up falling on her left side. She has seen her PCP who gave her methocarbamol and also a short course of steroids without improvement in sx. She states her pain is primarily throughout the back of her right leg, and is a 'burning' sensation. Pain will also occasionally radiate into her groin, but is not accompanied by bowel/bladder dysfunction.  She has an allergy to NSAIDs, so typically takes tylenol for her pain. This has not been helping. Patient states she has to go back to work tomorrow, and they are very understanding of her symptoms.   PERTINENT  PMH / PSH: Lateral epicondylitis, sinusitis   OBJECTIVE:   BP 126/84   Ht 5\' 9"  (1.753 m)   Wt 206 lb (93.4 kg)   BMI 30.42 kg/m    Back Normal skin, Spine with normal alignment and no deformity. Tenderness on lumbar vertebral process palpation.  Left lumbar paraspinous muscles are tender and with spasm, most notable along L5-S1.   Range of motion is limited at lumbar sacral regions, with significant pain upon flexion/extension of the lumbar spine. Positive straight right leg test- with pain in right leg with raising of left leg. 4 out of 5 strength with foot plantar flexion, eversion on right, 5 out of 5 on left.  Strength otherwise intact.  Distal pulses in tact, normal cap refill  ASSESSMENT/PLAN:   Sciatica of right side Most likely sciatica following injury to back 1 month ago. No red flag symptoms, though she does have significant discomfort. Recommend: -Due to NSAID allergy, will start Gabapentin 300mg  QHS. Titrate as needed/tolerated.  -Xrays of the lumbar spine due to fall initiating symptoms. -Gentle activity as tolerated  -Referral to physical  therapy  -Given positive SLR, pain along S1 dermatome, will place referral for consideration of ESI -If she develops bowel/bladder dysfunction, RTC for immediate reevaluation.  -Consider MRI pending XR/Response to [facility].     Lattie Haw, MD [place]   Saw and evaluated patient with resident. Note edited as needed for accuracy.  Leim Fabry, DO Sports Medicine Fellow  Addendum:  I was the preceptor for this visit and available for immediate consultation.  Karlton Lemon MD Kirt Boys

## 2021-01-11 NOTE — Patient Instructions (Addendum)
Thank you for coming to see me today. It was a pleasure. Today we discussed your back and leg pain. It sounds like sciatica. I recommend: -Gabapentin 300 mg at nighttime. This may cause grogginess for the first few days. If it doesn't help please let us know so we can increase your dose as needed. -Xrays of the back -Gentle activity -Referral to physical therapy  -Referral to Dr. Ernestina Patches (interventional pain) for spinal injection. They will call you to schedule an appt.  Please follow-up with [place] following the xray results.   If you have any questions or concerns, please do not hesitate to call the office.  Best wishes,   Dr Posey Pronto

## 2021-01-11 NOTE — Assessment & Plan Note (Addendum)
Most likely sciatica following injury to back 1 month ago. Recommend: -Toradol and dexamethasone injection in clinic today -Mobic for 7-10 days  -Xrays of the lumbar spine  -Gentle activity as tolerated  -Referral to physical therapy  -Follow up at [facility] after lumbar x-rays

## 2021-01-16 ENCOUNTER — Telehealth: Payer: Self-pay | Admitting: Physical Medicine and Rehabilitation

## 2021-01-16 NOTE — Telephone Encounter (Signed)
Received call from Rod Can with [facility] asking for a call back concerning Authorization reference # [id number]. The number to contact Janamie B. Is [phone]   Fax# [phone]

## 2021-03-02 ENCOUNTER — Telehealth (INDEPENDENT_AMBULATORY_CARE_PROVIDER_SITE_OTHER): Payer: 59 | Admitting: Family Medicine

## 2021-03-02 VITALS — Temp 101.2°F | Ht 69.0 in | Wt 206.0 lb

## 2021-03-02 DIAGNOSIS — U071 COVID-19: Secondary | ICD-10-CM | POA: Diagnosis not present

## 2021-03-02 MED ORDER — NIRMATRELVIR/RITONAVIR (PAXLOVID)TABLET: 3.0000 | ORAL_TABLET | Freq: Two times a day (BID) | ORAL | 0 refills | Status: AC

## 2021-03-02 NOTE — Progress Notes (Signed)
[facility] [address] [address] Dept: [phone] Dept Fax: [phone]  Virtual Video Visit  I connected with [name] on 03/02/21 at  4:15 PM EDT by a video enabled telemedicine application and verified that I am speaking with the correct person using two identifiers.  Location patient: Home Location provider: Clinic Persons participating in the virtual visit: Patient, Provider  I discussed the limitations of evaluation and management by telemedicine and the availability of in person appointments. The patient expressed understanding and agreed to proceed.  Chief Complaint  Patient presents with   Acute Visit    C/o having fever, congestion, HA's, swollen eyes, chills, body aches x 3 days. Has taken Aleve, NyQuil, Tylenol.  tested positive for Covid 03/01/21.     SUBJECTIVE:  HPI: Anne Lewis is a 55 y.o. female who presents with a 2-day history of fever (up to 101.0 F), congestion, headache, swelling of the eyes, chills, sneezing, and myalgias. Her boss had tested positive for COVID last Thursday. Over the past week, she had conducted several COVID tests, which were negative. However, with symptoms onset Wed, she has now tested positive for COVID. Ms. Elizbeth Squires had [id number] last January and had a bit of a prolonged course of symptoms. She is currently taking Tylenol, Aleve, Nyquil and Mucinex for symptom relief. She is not having dyspnea.  Patient Active Problem List   Diagnosis Date Noted   Sciatica of right side 01/11/2021   Paresthesias 08/30/2019   Weight gain 08/30/2019   Depression 08/30/2019   Reactive airway disease 10/06/2018   Osteoarthritis of spine with radiculopathy, cervical region 04/16/2018   Urticaria 04/16/2018   Tooth pain 04/16/2018   Acute non-recurrent sinusitis 08/19/2017   Lateral epicondylitis of left elbow 08/19/2017   Cough 08/19/2017   Healthcare maintenance  08/19/2017   Family history of malignant neoplasm of breast 11/08/2013   Family history of malignant neoplasm of gastrointestinal tract 11/08/2013   Family history of malignant neoplasm of ovary 11/08/2013   RUQ PAIN 08/08/2010   DYSPEPSIA&OTHER SPEC DISORDERS FUNCTION STOMACH 08/07/2010   ABDOMINAL PAIN, EPIGASTRIC 08/07/2010   OTHER CONSTIPATION 12/05/2009   Past Surgical History:  Procedure Laterality Date   CESAREAN SECTION  [id number]   ENDOMETRIAL CRYOABLATION  12.19.2012   HER OPTION - office performed   TONSILLECTOMY  1987   Family History  Problem Relation Age of Onset   Cancer Cousin 30       ovarian   Cancer Cousin 48       breast   Hypertension Mother    Cancer Mother 62       intestational and ovarian   Diverticulitis Mother    Diabetes Father    Hypertension Father    Heart disease Father    Cancer Sister 49       ovarian and intestional   Breast cancer Maternal Aunt 28   Cancer Brother 24       GI cancer   Cancer Paternal Aunt 73       stomach   Cancer Maternal Grandmother 79       ovarian and pancreatic   Cancer Maternal Grandfather 82       pancreatic   Social History   Tobacco Use   Smoking status: Some Days    Types: Cigarettes   Smokeless tobacco: Never  Vaping Use   Vaping Use: Never used  Substance Use Topics   Alcohol use: Yes    Comment: occ  Drug use: No    Current Outpatient Medications:    nirmatrelvir/ritonavir EUA (PAXLOVID) TABS, Take 3 tablets by mouth 2 (two) times daily for 5 days. (Take nirmatrelvir 150 mg two tablets twice daily for 5 days and ritonavir 100 mg one tablet twice daily for 5 days) Patient GFR is > 60, Disp: 30 tablet, Rfl: 0   acetaminophen (TYLENOL) 160 MG/5ML elixir, Take 325 mg by mouth every 4 (four) hours as needed for fever., Disp: , Rfl:    gabapentin (NEURONTIN) 300 MG capsule, Take 1 capsule (300 mg total) by mouth at bedtime., Disp: 30 capsule, Rfl: 1   methocarbamol (ROBAXIN) 500 MG tablet, Take 1  tablet (500 mg total) by mouth 3 (three) times daily., Disp: 30 tablet, Rfl: 0   Vitamin D, Ergocalciferol, (DRISDOL) 1.25 MG (50000 UNIT) CAPS capsule, Take 1 capsule (50,000 Units total) by mouth every 7 (seven) days., Disp: 5 capsule, Rfl: 5  Allergies  Allergen Reactions   Aspirin Swelling and Other (See Comments)    Bleeding   Augmentin [Amoxicillin-Pot Clavulanate] Swelling   Oxycodone Nausea And Vomiting   ROS: See pertinent positives and negatives per HPI.  OBSERVATIONS/OBJECTIVE:  VITALS per patient if applicable: Today's Vitals   03/02/21 1626  Temp: (!) 101.2 F (38.4 C)  TempSrc: Temporal  Weight: 206 lb (93.4 kg)  Height: '5\' 9"'$  (1.753 m)   Body mass index is 30.42 kg/m.   GENERAL: Alert and oriented. Appears well and in no acute distress.  HEENT: Atraumatic. Conjunctiva clear. No obvious abnormalities on inspection of external nose and ears.  NECK: Normal movements of the head and neck.  LUNGS: On inspection, no signs of respiratory distress. Breathing rate appears normal. No obvious gross SOB, gasping or wheezing, and no conversational dyspnea.  CV: No obvious cyanosis.  MS: Moves all visible extremities without noticeable abnormality.  PSYCH/NEURO: Pleasant and cooperative. No obvious depression or anxiety. Speech and thought processing grossly intact.  ASSESSMENT AND PLAN:  1. COVID-19 Reviewed home care instructions for COVID. Advised self-isolation at home for at least 5 days. After 5 days, if improved and fever resolved, can be in public, but should wear a mask around others for an additional 5 days. Patient is interested in antiviral medication. If symptoms, esp, dyspnea develops/worsens, recommend in-person evaluation at either an urgent care or the emergency room.  - nirmatrelvir/ritonavir EUA (PAXLOVID) TABS; Take 3 tablets by mouth 2 (two) times daily for 5 days. (Take nirmatrelvir 150 mg two tablets twice daily for 5 days and ritonavir 100 mg one  tablet twice daily for 5 days) Patient GFR is > 60  Dispense: 30 tablet; Refill: 0  I discussed the assessment and treatment plan with the patient. The patient was provided an opportunity to ask questions and all were answered. The patient agreed with the plan and demonstrated an understanding of the instructions.   The patient was advised to call back or seek an in-person evaluation if the symptoms worsen or if the condition fails to improve as anticipated.   Haydee Salter, MD

## 2021-03-05 ENCOUNTER — Ambulatory Visit: Payer: 59 | Admitting: Family Medicine

## 2021-03-08 ENCOUNTER — Telehealth: Payer: Self-pay | Admitting: Family Medicine

## 2021-03-08 NOTE — Telephone Encounter (Signed)
Pt called and said that she received a negative test for covid today and all she needs now is a note for work to be able to return, she saw Dr Gena Fray on 03/02/21. Please advise

## 2021-03-12 NOTE — Telephone Encounter (Signed)
Called and lvm. Sw,cma ?

## 2021-03-15 NOTE — Telephone Encounter (Signed)
Lft VM to rtn call. Dm/cma  

## 2021-03-16 NOTE — Telephone Encounter (Signed)
Lft VM to rtn call. Dm/cma  

## 2021-04-27 IMAGING — DX DG KNEE COMPLETE 4+V*L*
4 series · 4 of 4 positions shown · non-contrast
Comparison: None.

CLINICAL DATA: Left knee pain and swelling.  No known injury.

EXAM:
LEFT KNEE - COMPLETE 4+ VIEW

[knee ap]
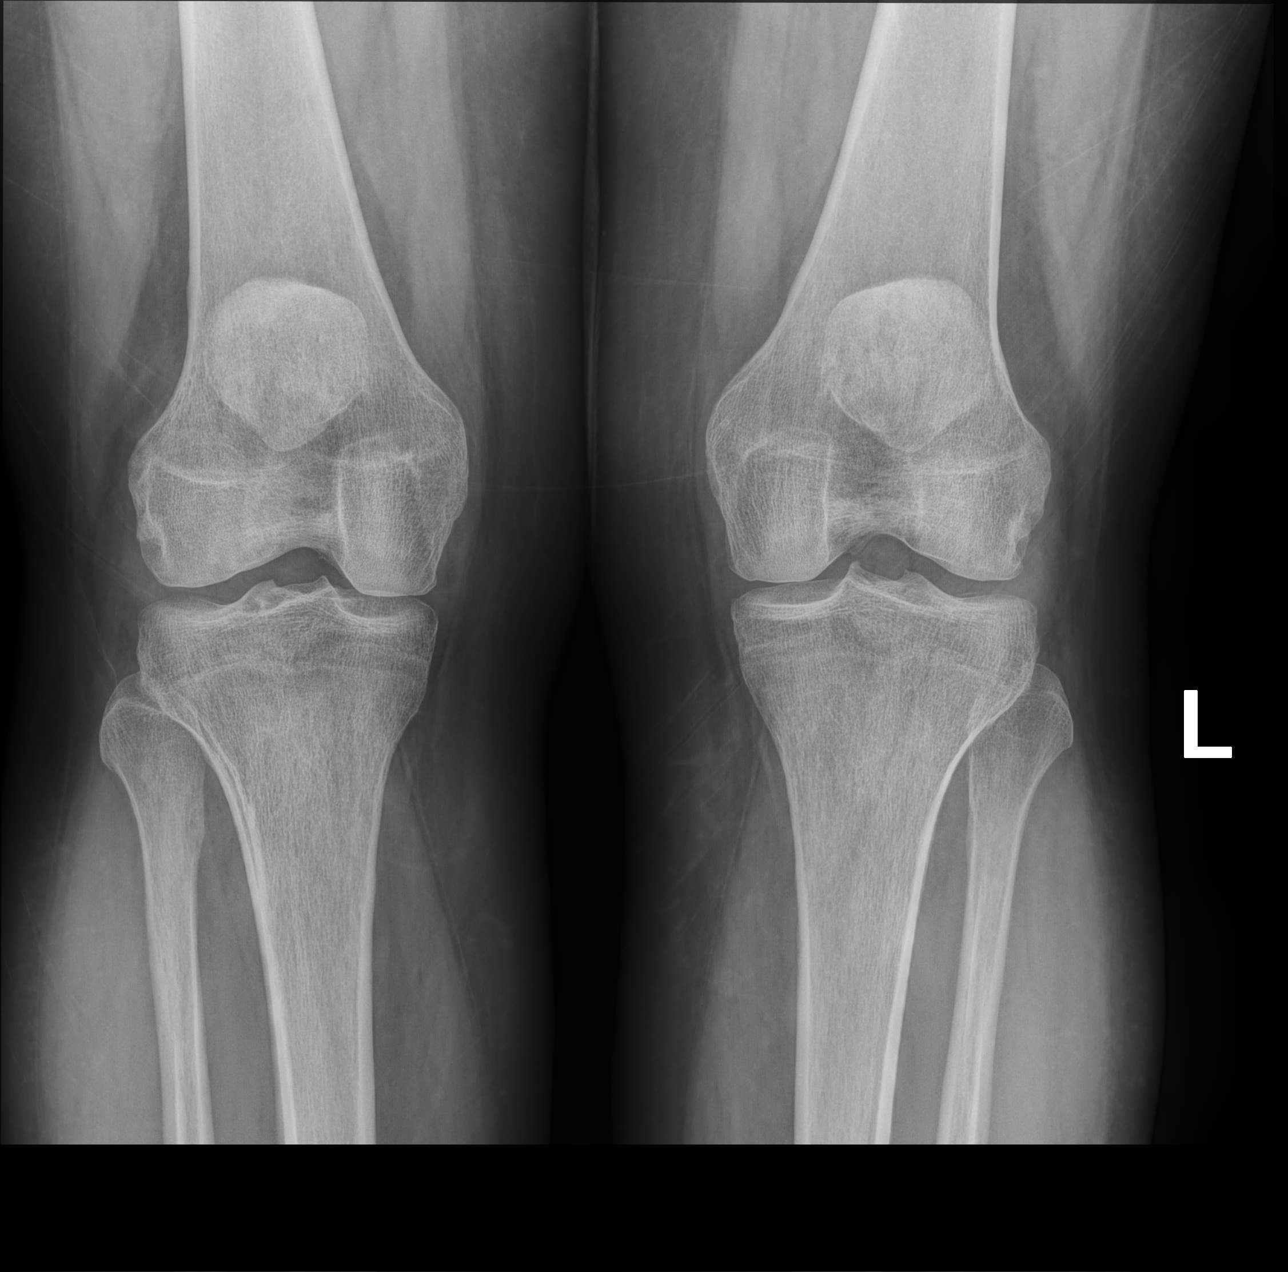

[knee [person_name] view pa]
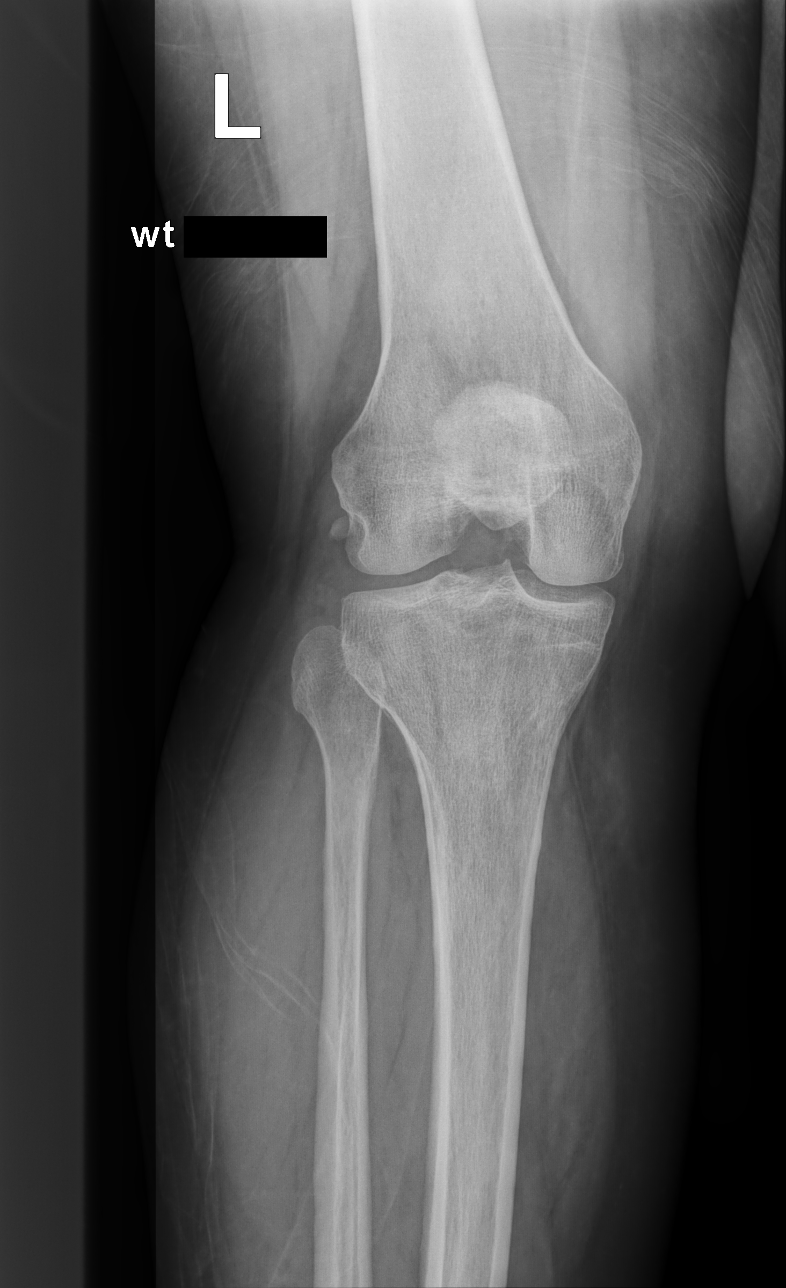

[knee lat]
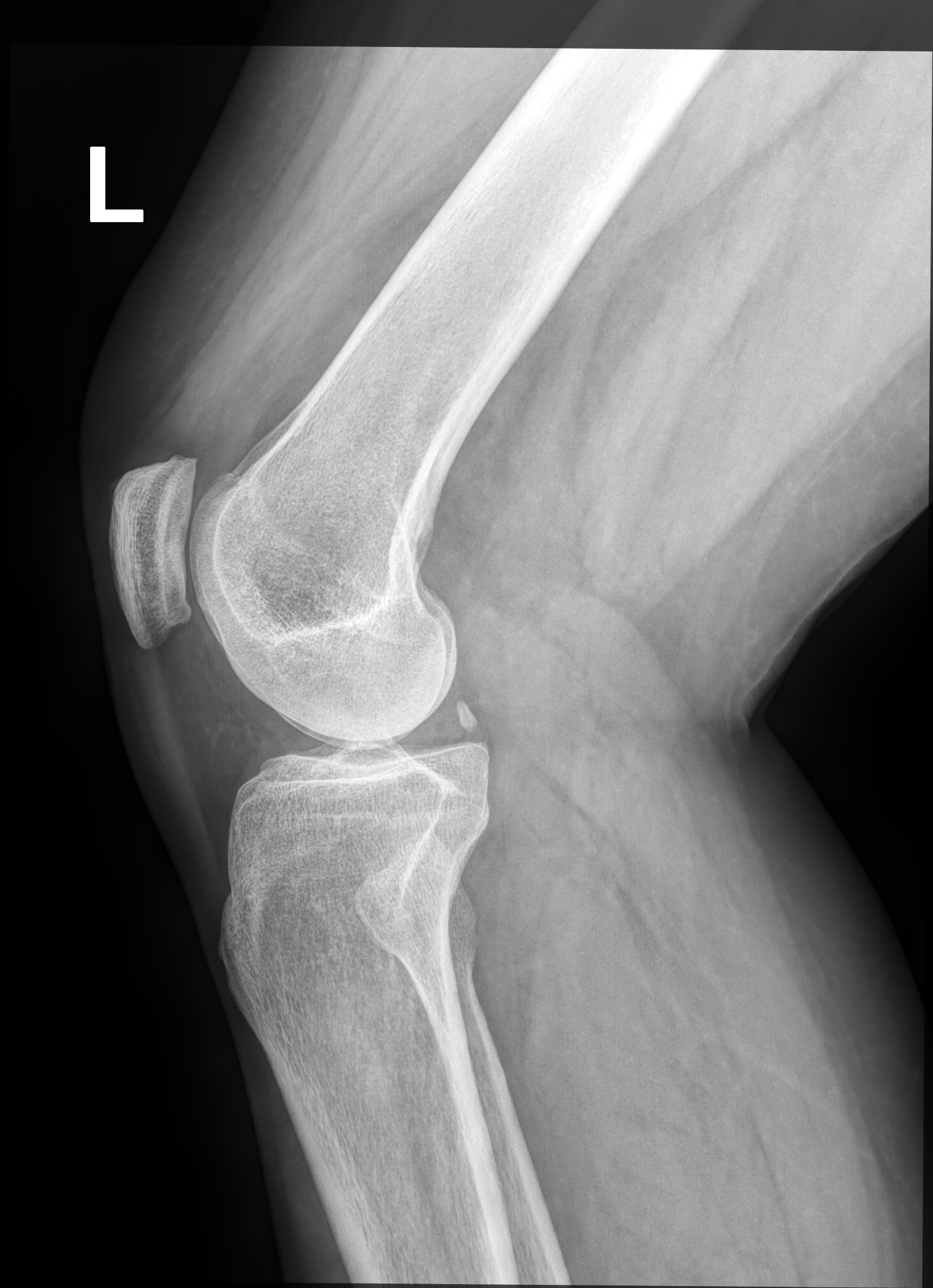

[patella (sunrise)]
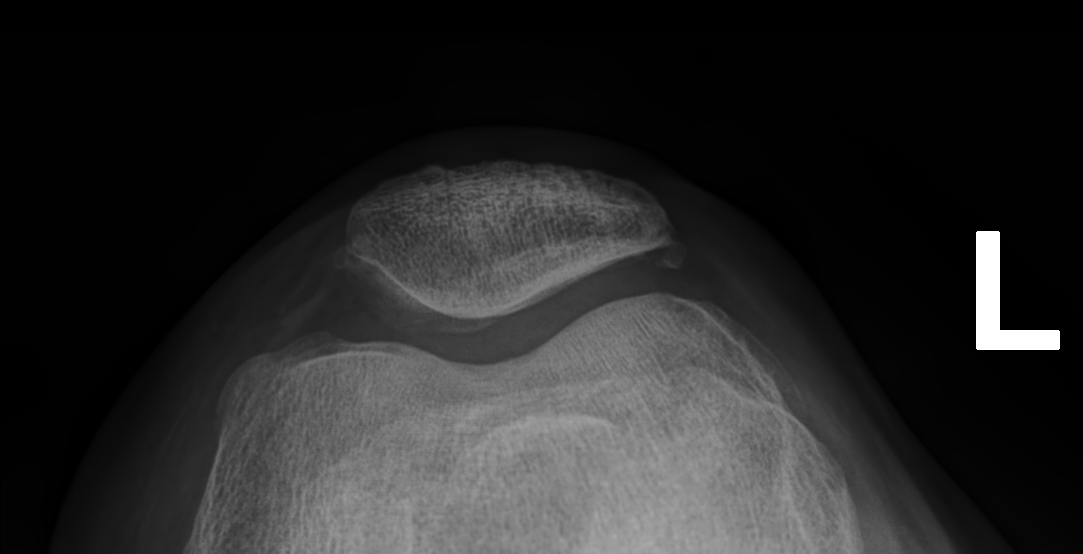

[4 of 4 positions shown; findings below may reference images not displayed]

FINDINGS: No evidence of fracture or dislocation. A small knee joint effusion
is seen. Mild degenerative spurring is seen involving the patella.
No other signs of arthropathy. No focal bone lesions.
IMPRESSION: Small knee joint effusion.

Mild degenerative spurring of patella.

## 2021-05-03 ENCOUNTER — Telehealth (INDEPENDENT_AMBULATORY_CARE_PROVIDER_SITE_OTHER): Payer: 59 | Admitting: Family Medicine

## 2021-05-03 ENCOUNTER — Encounter: Payer: Self-pay | Admitting: Family Medicine

## 2021-05-03 VITALS — Wt 213.0 lb

## 2021-05-03 DIAGNOSIS — H539 Unspecified visual disturbance: Secondary | ICD-10-CM | POA: Diagnosis not present

## 2021-05-03 DIAGNOSIS — R519 Headache, unspecified: Secondary | ICD-10-CM

## 2021-05-03 NOTE — Progress Notes (Signed)
Virtual Visit via Video Note  I connected with Anne Lewis  on 05/03/21 at 11:00 AM EDT by a video enabled telemedicine application and verified that I am speaking with the correct person using two identifiers.  Location patient: home, [place] Location provider:work or home office Persons participating in the virtual visit: patient, provider  I discussed the limitations of evaluation and management by telemedicine and the availability of in person appointments. The patient expressed understanding and agreed to proceed.   HPI:  Acute telemedicine visit for severe headaches: -Onset: about 8 days ago -covid test was negative -Symptoms include: headache bitemporal, severe at times, sweaty at times from the pain, some light sensitivity, feels like is having blurred vision which is changed from baseline -reports the severity is interrupting sleep and ability to work -Denies:fevers, sinus congestion, sinus drainage, sore throat, NVD, inability to eat, drink, get out of bed -Has tried:tylenol or aleve which has worked for her in the past for migraines but is not helping -Pertinent past medical history: has had covid twice and reports has chronic symptoms from this, has had headaches since the covid infections but none like this -has history of migraine as well -Pertinent medication allergies: Allergies  Allergen Reactions   Aspirin Swelling and Other (See Comments)    Bleeding   Augmentin [Amoxicillin-Pot Clavulanate] Swelling   Oxycodone Nausea And Vomiting  -COVID-19 vaccine status:has had 2 vaccines and 2 boosters -reports PCP office did not have any openings available until the end of next week  ROS: See pertinent positives and negatives per HPI.  Past Medical History:  Diagnosis Date   BRCA negative 11/2013   H pylori ulcer    Ulcer of gastric fundus     Past Surgical History:  Procedure Laterality Date   CESAREAN SECTION  0300,9233   ENDOMETRIAL CRYOABLATION  12.19.2012   HER OPTION  - office performed   TONSILLECTOMY  1987     Current Outpatient Medications:    acetaminophen (TYLENOL) 160 MG/5ML elixir, Take 325 mg by mouth every 4 (four) hours as needed for fever., Disp: , Rfl:    Vitamin D, Ergocalciferol, (DRISDOL) 1.25 MG (50000 UNIT) CAPS capsule, Take 1 capsule (50,000 Units total) by mouth every 7 (seven) days., Disp: 5 capsule, Rfl: 5  EXAM:  VITALS per patient if applicable:  GENERAL: alert, oriented, appears to not feel well  HEENT: atraumatic, conjunttiva clear, no obvious abnormalities on inspection of external nose and ears  NECK: normal movements of the head and neck  LUNGS: on inspection no signs of respiratory distress, breathing rate appears normal, no obvious gross SOB, gasping or wheezing  CV: no obvious cyanosis  MS: moves all visible extremities without noticeable abnormality  PSYCH/NEURO: pleasant and cooperative, no obvious depression or anxiety, speech and thought processing grossly intact  ASSESSMENT AND PLAN:  Discussed the following assessment and plan:  Intractable headache, unspecified chronicity pattern, unspecified headache type  Vision disturbance  -we discussed possible serious and likely etiologies, options for evaluation and workup, limitations of telemedicine visit vs in person visit, treatment, treatment risks and precautions. Pt is agreeable to treatment via telemedicine at this moment. This may be an atypical migraine, however, given the degree of reported symptoms advised prompt inperson evaluation at a higher level of care to r/o other causes of severe headache and to treat with possible migraine cocktail if felt to be a migraine. She reports this is more severe and different from her headaches in the past and is reporting change in  vision so advised evaluation at med center or ER. She agrees to seek care today and prefers to go by private vehicle, reports has someone to drive her.      I discussed the assessment  and treatment plan with the patient. The patient was provided an opportunity to ask questions and all were answered. The patient agreed with the plan and demonstrated an understanding of the instructions.     Lucretia Kern, DO

## 2021-05-03 NOTE — Patient Instructions (Signed)
Seek inperson medical care today as we discussed. If worsening severe or life-threatening symptoms develop please cal 911.   -  I hope you are feeling better soon!  It was nice to meet you today. I help  out with telemedicine visits on Tuesdays and Thursdays and am available for visits on those days. If you have any concerns or questions following this visit please schedule a follow up visit with your Primary Care doctor or seek care at a local urgent care clinic to avoid delays in care.

## 2021-05-15 ENCOUNTER — Other Ambulatory Visit: Payer: Self-pay

## 2021-05-16 ENCOUNTER — Ambulatory Visit (INDEPENDENT_AMBULATORY_CARE_PROVIDER_SITE_OTHER): Payer: 59 | Admitting: Family Medicine

## 2021-05-16 VITALS — BP 130/78 | HR 90 | Temp 97.3°F | Ht 69.0 in | Wt 216.6 lb

## 2021-05-16 DIAGNOSIS — R519 Headache, unspecified: Secondary | ICD-10-CM

## 2021-05-16 DIAGNOSIS — R635 Abnormal weight gain: Secondary | ICD-10-CM | POA: Diagnosis not present

## 2021-05-16 DIAGNOSIS — Z23 Encounter for immunization: Secondary | ICD-10-CM | POA: Diagnosis not present

## 2021-05-16 MED ORDER — ZOLMITRIPTAN 2.5 MG PO TABS: 2.5000 mg | ORAL_TABLET | Freq: Once | ORAL | 1 refills | Status: DC

## 2021-05-16 NOTE — Addendum Note (Signed)
Addended by: Konrad Saha on: 05/16/2021 02:17 PM   Modules accepted: Orders

## 2021-05-16 NOTE — Progress Notes (Signed)
[facility] [address] [place] [address] Dept: [phone] Dept Fax: [phone]  Office Visit  Subjective:    Patient ID: Anne Lewis, female    DOB: 09-16-1965, 55 y.o..   MRN: [id number]  Chief Complaint  Patient presents with   Follow-up    F/u HA that has not gotten any better since video visit last week.   Also having trouble with weight gain.  Wants flu shot & shingles today.      History of Present Illness:  Patient is in today for follow-up from a recent video visit for intractable headache. Anne Lewis notes she has a history of migraine headaches starting her teen years. Some years ago, she was treated with Imitrex for these, but has not been on medication for quite some time. She notes her baseline headaches occur about every 2-3 weeks.  Currently, she has had a continuous headache for the past month. This waxes and wanes in intensity. She describes a pressure sensation of the whole head. This is associated with mild photophobia, but denies significant nausea. She has not had any other focal neurological signs. She did go for an eye exam this morning. The eye doctor is changing the prescription for her eye glasses, but did not find other eye disease.  Anne Lewis also notes concerns with increasing weight. She notes that she had some thyroid testing int he past, which was normal. She is unsure why she is gaining weight so much and is concerned about this. She requests a referral for the [facility].  Past Medical History: Patient Active Problem List   Diagnosis Date Noted   Sciatica of right side 01/11/2021   Paresthesias 08/30/2019   Weight gain 08/30/2019   Depression 08/30/2019   Reactive airway disease 10/06/2018   Osteoarthritis of spine with radiculopathy, cervical region 04/16/2018   Urticaria 04/16/2018   Tooth pain 04/16/2018   Acute non-recurrent sinusitis 08/19/2017    Lateral epicondylitis of left elbow 08/19/2017   Cough 08/19/2017   Healthcare maintenance 08/19/2017   Family history of malignant neoplasm of breast 11/08/2013   Family history of malignant neoplasm of gastrointestinal tract 11/08/2013   Family history of malignant neoplasm of ovary 11/08/2013   RUQ PAIN 08/08/2010   DYSPEPSIA&OTHER SPEC DISORDERS FUNCTION STOMACH 08/07/2010   ABDOMINAL PAIN, EPIGASTRIC 08/07/2010   OTHER CONSTIPATION 12/05/2009   Past Surgical History:  Procedure Laterality Date   CESAREAN SECTION  5170,0174   ENDOMETRIAL CRYOABLATION  12.19.2012   HER OPTION - office performed   TONSILLECTOMY  1987   Family History  Problem Relation Age of Onset   Cancer Cousin 59       ovarian   Cancer Cousin 91       breast   Hypertension Mother    Cancer Mother 18       intestational and ovarian   Diverticulitis Mother    Diabetes Father    Hypertension Father    Heart disease Father    Cancer Sister 70       ovarian and intestional   Breast cancer Maternal Aunt 36   Cancer Brother 24       GI cancer   Cancer Paternal Aunt 73       stomach   Cancer Maternal Grandmother 79       ovarian and pancreatic   Cancer Maternal Grandfather 82       pancreatic   Outpatient Medications Prior to Visit  Medication Sig Dispense Refill   Vitamin D, Ergocalciferol, (DRISDOL) 1.25 MG (50000 UNIT) CAPS capsule Take 1 capsule (50,000 Units total) by mouth every 7 (seven) days. 5 capsule 5   acetaminophen (TYLENOL) 160 MG/5ML elixir Take 325 mg by mouth every 4 (four) hours as needed for fever.     No facility-administered medications prior to visit.   Allergies  Allergen Reactions   Aspirin Swelling and Other (See Comments)    Bleeding   Augmentin [Amoxicillin-Pot Clavulanate] Swelling   Oxycodone Nausea And Vomiting    Objective:   Today's Vitals   05/16/21 1301  BP: 130/78  Pulse: 90  Temp: (!) 97.3 F (36.3 C)  TempSrc: Temporal  SpO2: 99%  Weight: 216 lb 9.6  oz (98.2 kg)  Height: 5\' 9"  (1.753 m)   Body mass index is 31.99 kg/m.   General: Well developed, well nourished. No acute distress. HEENT: Normocephalic, non-traumatic. PERRL, EOMI. Conjunctiva clear. External ears   normal. EAC and TMs normal bilaterally. Oropharynx clear. Good dentition. Neck: Supple. No lymphadenopathy. No thyromegaly. Lungs: Clear to auscultation bilaterally. No wheezing, rales or rhonchi. CV: RRR without murmurs or rubs. Pulses 2+ bilaterally. Neuro: CN II-XII grossly intact. No focal neurological deficits. Psych: Alert and oriented. Normal mood and affect.  Health Maintenance Due  Topic Date Due   Pneumococcal Vaccine 77-33 Years old (1 - PCV) Never done   HIV Screening  Never done   Hepatitis C Screening  Never done   MAMMOGRAM  11/17/2015   Zoster Vaccines- Shingrix (1 of 2) Never done   PAP SMEAR-Modifier  10/20/2016   COLONOSCOPY (Pts 45-56yrs Insurance coverage will need to be confirmed)  12/14/2019   INFLUENZA VACCINE  Never done   COVID-19 Vaccine (5 - Booster) 04/28/2021     Assessment & Plan:   1. Intractable headache, unspecified chronicity pattern, unspecified headache type Anne Lewis's headache is intractable. It is unclear if this might be a migraine or some other headache condition. I will give her a trial of Zomig. I would like her to follow-up with her PCP, Dr. Ethelene Hal. She has quite a few health maintenance issues that need to be addressed so recommended an annual preventative visit.  - ZOLMitriptan (ZOMIG) 2.5 MG tablet; Take 1 tablet (2.5 mg total) by mouth once for 1 dose. May repeat in 2 hours if headache persists or recurs.  Dispense: 10 tablet; Refill: 1  2. Weight gain Anne Lewis's weight has increased 60 lbs. in the past 6 years. I will go ahead and refer her for medical weight management, but do want her to follow up with Dr. Ethelene Hal for further assessment.  - Amb Ref to Medical Weight Management    Haydee Salter, MD

## 2021-06-18 ENCOUNTER — Encounter: Payer: Self-pay | Admitting: Family Medicine

## 2021-06-18 ENCOUNTER — Other Ambulatory Visit: Payer: Self-pay

## 2021-06-18 ENCOUNTER — Ambulatory Visit (INDEPENDENT_AMBULATORY_CARE_PROVIDER_SITE_OTHER): Payer: 59 | Admitting: Family Medicine

## 2021-06-18 VITALS — BP 130/74 | HR 82 | Temp 97.9°F | Ht 69.0 in | Wt 214.6 lb

## 2021-06-18 DIAGNOSIS — R635 Abnormal weight gain: Secondary | ICD-10-CM | POA: Diagnosis not present

## 2021-06-18 DIAGNOSIS — N951 Menopausal and female climacteric states: Secondary | ICD-10-CM | POA: Diagnosis not present

## 2021-06-18 DIAGNOSIS — Z0000 Encounter for general adult medical examination without abnormal findings: Secondary | ICD-10-CM

## 2021-06-18 DIAGNOSIS — F341 Dysthymic disorder: Secondary | ICD-10-CM | POA: Diagnosis not present

## 2021-06-18 DIAGNOSIS — E559 Vitamin D deficiency, unspecified: Secondary | ICD-10-CM | POA: Diagnosis not present

## 2021-06-18 DIAGNOSIS — R519 Headache, unspecified: Secondary | ICD-10-CM | POA: Insufficient documentation

## 2021-06-18 LAB — URINALYSIS, ROUTINE W REFLEX MICROSCOPIC
Bilirubin Urine: NEGATIVE
Hgb urine dipstick: NEGATIVE
Ketones, ur: NEGATIVE
Leukocytes,Ua: NEGATIVE
Nitrite: NEGATIVE
Specific Gravity, Urine: 1.025 (ref 1.000–1.030)
Total Protein, Urine: NEGATIVE
Urine Glucose: NEGATIVE
Urobilinogen, UA: 0.2 (ref 0.0–1.0)
pH: 5.5 (ref 5.0–8.0)

## 2021-06-18 LAB — CORTISOL: Cortisol, Plasma: 8.7 ug/dL

## 2021-06-18 LAB — COMPREHENSIVE METABOLIC PANEL
ALT: 23 U/L (ref 0–35)
AST: 16 U/L (ref 0–37)
Albumin: 4.8 g/dL (ref 3.5–5.2)
Alkaline Phosphatase: 92 U/L (ref 39–117)
BUN: 23 mg/dL (ref 6–23)
CO2: 27 mEq/L (ref 19–32)
Calcium: 10.3 mg/dL (ref 8.4–10.5)
Chloride: 99 mEq/L (ref 96–112)
Creatinine, Ser: 0.86 mg/dL (ref 0.40–1.20)
GFR: 75.96 mL/min (ref >60.00)
Glucose, Bld: 93 mg/dL (ref 70–99)
Potassium: 4.4 mEq/L (ref 3.5–5.1)
Sodium: 138 mEq/L (ref 135–145)
Total Bilirubin: 0.3 mg/dL (ref 0.2–1.2)
Total Protein: 7.9 g/dL (ref 6.0–8.3)

## 2021-06-18 LAB — LIPID PANEL
Cholesterol: 248 mg/dL — ABNORMAL HIGH (ref 0–200)
HDL: 41.1 mg/dL (ref >39.00)
NonHDL: 206.67
Total CHOL/HDL Ratio: 6
Triglycerides: 331 mg/dL — ABNORMAL HIGH (ref 0.0–149.0)
VLDL: 66.2 mg/dL — ABNORMAL HIGH (ref 0.0–40.0)

## 2021-06-18 LAB — CBC
HCT: 42.5 % (ref 36.0–46.0)
Hemoglobin: 14 g/dL (ref 12.0–15.0)
MCHC: 33 g/dL (ref 30.0–36.0)
MCV: 89.2 fl (ref 78.0–100.0)
Platelets: 383 10*3/uL (ref 150.0–400.0)
RBC: 4.76 Mil/uL (ref 3.87–5.11)
RDW: 13.3 % (ref 11.5–15.5)
WBC: 8.6 10*3/uL (ref 4.0–10.5)

## 2021-06-18 LAB — LDL CHOLESTEROL, DIRECT: Direct LDL: 156 mg/dL

## 2021-06-18 LAB — HEMOGLOBIN A1C: Hgb A1c MFr Bld: 5.9 % (ref 4.6–6.5)

## 2021-06-18 LAB — VITAMIN D 25 HYDROXY (VIT D DEFICIENCY, FRACTURES): VITD: 12.59 ng/mL — ABNORMAL LOW (ref 30.00–100.00)

## 2021-06-18 MED ORDER — FLUOXETINE HCL 10 MG PO TABS: ORAL_TABLET | ORAL | 1 refills | Status: DC

## 2021-06-18 NOTE — Progress Notes (Signed)
Established Patient Office Visit  Subjective:  Patient ID: Anne Lewis, female    DOB: 14-Aug-1965  Age: 55 y.o. MRN: [id number]  CC:  Chief Complaint  Patient presents with   Annual Exam    CPE, no concerns. Patient fasting for labs. Would like referrals gynecologist, colonoscopy, and mammogram.     HPI Anne Lewis presents for health check.  She is fasting.  She is upset about her weight gain over the last year or so with some 80 pounds.  She is upset at the next available appointment with weight loss management is months away.  She is behind on health maintenance.  Headaches continue.  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  Feels as though her menopause is severe.  Past Medical History:  Diagnosis Date   BRCA negative 11/2013   H pylori ulcer    Ulcer of gastric fundus     Past Surgical History:  Procedure Laterality Date   CESAREAN SECTION  0076,2263   ENDOMETRIAL CRYOABLATION  12.19.2012   HER OPTION - office performed   TONSILLECTOMY  1987    Family History  Problem Relation Age of Onset   Hypertension Mother    Cancer Mother 42       intestational and ovarian   Diverticulitis Mother    Diabetes Father    Hypertension Father    Heart disease Father    Cancer Sister 85       ovarian and intestional   Cancer Brother 24       GI cancer   Breast cancer Maternal Aunt 73   Cancer Paternal Aunt 73       stomach   Cancer Maternal Grandmother 79       ovarian and pancreatic   Cancer Maternal Grandfather 39       pancreatic   Cancer Cousin 39       ovarian   Cancer Cousin 52       breast    Social History   Socioeconomic History   Marital status: Divorced    Spouse name: Not on file   Number of children: Not on file   Years of education: Not on file   Highest education level: Not on file  Occupational History   Not on file  Tobacco Use   Smoking status: Some Days    Types: Cigarettes   Smokeless tobacco: Never  Vaping Use   Vaping Use: Never  used  Substance and Sexual Activity   Alcohol use: Yes    Comment: occ   Drug use: No   Sexual activity: Yes    Birth control/protection: Post-menopausal  Other Topics Concern   Not on file  Social History Narrative   Not on file   Social Determinants of Health   Financial Resource Strain: Not on file  Food Insecurity: Not on file  Transportation Needs: Not on file  Physical Activity: Not on file  Stress: Not on file  Social Connections: Not on file  Intimate Partner Violence: Not on file    Outpatient Medications Prior to Visit  Medication Sig Dispense Refill   Vitamin D, Ergocalciferol, (DRISDOL) 1.25 MG (50000 UNIT) CAPS capsule Take 1 capsule (50,000 Units total) by mouth every 7 (seven) days. 5 capsule 5   ZOLMitriptan (ZOMIG) 2.5 MG tablet Take 1 tablet (2.5 mg total) by mouth once for 1 dose. May repeat in 2 hours if headache persists or recurs. 10 tablet 1   No facility-administered medications prior to visit.  Allergies  Allergen Reactions   Aspirin Swelling and Other (See Comments)    Bleeding   Augmentin [Amoxicillin-Pot Clavulanate] Swelling   Oxycodone Nausea And Vomiting    ROS Review of Systems  Constitutional:  Negative for diaphoresis, fatigue, fever and unexpected weight change.  HENT: Negative.    Eyes:  Negative for photophobia and visual disturbance.  Respiratory: Negative.    Cardiovascular: Negative.   Gastrointestinal: Negative.   Endocrine: Negative for polyphagia and polyuria.  Genitourinary: Negative.   Musculoskeletal:  Negative for gait problem and joint swelling.  Neurological:  Positive for headaches. Negative for facial asymmetry, speech difficulty and weakness.  Hematological:  Does not bruise/bleed easily.  Depression screen [facility] 2/9 06/18/2021 06/18/2021 12/27/2020  Decreased Interest 1 0 0  Down, Depressed, Hopeless 1 0 0  PHQ - 2 Score 2 0 0  Altered sleeping 3 - -  Tired, decreased energy 2 - -  Change in appetite 2 - -   Feeling bad or failure about yourself  3 - -  Trouble concentrating 0 - -  Moving slowly or fidgety/restless 0 - -  Suicidal thoughts 0 - -  PHQ-9 Score 12 - -  Difficult doing work/chores Not difficult at all - -       Objective:    Physical Exam Vitals and nursing note reviewed.  Constitutional:      General: She is not in acute distress.    Appearance: Normal appearance. She is not ill-appearing, toxic-appearing or diaphoretic.  HENT:     Head: Normocephalic and atraumatic.     Right Ear: Tympanic membrane, ear canal and external ear normal.     Left Ear: Tympanic membrane, ear canal and external ear normal.     Mouth/Throat:     Mouth: Mucous membranes are moist.     Pharynx: Oropharynx is clear. No oropharyngeal exudate or posterior oropharyngeal erythema.  Eyes:     General: No scleral icterus.       Right eye: No discharge.        Left eye: No discharge.     Extraocular Movements: Extraocular movements intact.     Conjunctiva/sclera: Conjunctivae normal.     Pupils: Pupils are equal, round, and reactive to light.  Neck:     Vascular: No carotid bruit.  Cardiovascular:     Rate and Rhythm: Normal rate and regular rhythm.  Pulmonary:     Effort: Pulmonary effort is normal.     Breath sounds: Normal breath sounds.  Abdominal:     General: Bowel sounds are normal.  Musculoskeletal:     Cervical back: No rigidity or tenderness.     Right lower leg: No edema.     Left lower leg: No edema.  Lymphadenopathy:     Cervical: No cervical adenopathy.  Skin:    General: Skin is warm and dry.  Neurological:     Mental Status: She is alert and oriented to person, place, and time.     Cranial Nerves: No dysarthria or facial asymmetry.  Psychiatric:        Mood and Affect: Mood normal.        Behavior: Behavior normal.    BP 130/74 (BP Location: Right Arm, Patient Position: Sitting, Cuff Size: Large)   Pulse 82   Temp 97.9 F (36.6 C) (Temporal)   Ht '5\' 9"'  (1.753 m)    Wt 214 lb 9.6 oz (97.3 kg)   SpO2 96%   BMI 31.69 kg/m  Wt Readings from  Last 3 Encounters:  06/18/21 214 lb 9.6 oz (97.3 kg)  05/16/21 216 lb 9.6 oz (98.2 kg)  05/03/21 213 lb (96.6 kg)     Health Maintenance Due  Topic Date Due   Pneumococcal Vaccine 24-21 Years old (1 - PCV) Never done   HIV Screening  Never done   Hepatitis C Screening  Never done   MAMMOGRAM  11/17/2015   PAP SMEAR-Modifier  10/20/2016   COLONOSCOPY (Pts 45-45yr Insurance coverage will need to be confirmed)  12/14/2019    There are no preventive care reminders to display for this patient.  Lab Results  Component Value Date   TSH 0.97 06/20/2020   Lab Results  Component Value Date   WBC 10.6 (H) 08/23/2020   HGB 14.9 08/23/2020   HCT 45.7 08/23/2020   MCV 91.6 08/23/2020   PLT 475 (H) 08/23/2020   Lab Results  Component Value Date   NA 135 08/23/2020   K 4.1 08/23/2020   CO2 24 08/23/2020   GLUCOSE 122 (H) 08/23/2020   BUN 24 (H) 08/23/2020   CREATININE 0.85 08/23/2020   BILITOT 0.3 08/30/2019   ALKPHOS 83 08/30/2019   AST 21 08/30/2019   ALT 20 08/30/2019   PROT 7.9 08/30/2019   ALBUMIN 4.5 08/30/2019   CALCIUM 9.5 08/23/2020   ANIONGAP 11 08/23/2020   GFR 67.88 06/20/2020   Lab Results  Component Value Date   CHOL 218 (H) 08/30/2019   Lab Results  Component Value Date   HDL 34.70 (L) 08/30/2019   Lab Results  Component Value Date   LDLCALC 115 (H) 08/12/2016   Lab Results  Component Value Date   TRIG 360.0 (H) 08/30/2019   Lab Results  Component Value Date   CHOLHDL 6 08/30/2019   Lab Results  Component Value Date   HGBA1C 5.9 08/30/2019      Assessment & Plan:   Problem List Items Addressed This Visit       Other   Healthcare maintenance   Relevant Orders   CBC   Comprehensive metabolic panel   Lipid panel   Hemoglobin A1c   Urinalysis, Routine w reflex microscopic   MM Digital Screening   Ambulatory referral to Gynecology   Ambulatory referral  to Gastroenterology   Weight gain - Primary   Relevant Orders   Cortisol   Dysthymia   Relevant Medications   FLUoxetine (PROZAC) 10 MG tablet   Perimenopausal   Relevant Orders   Ambulatory referral to Gynecology   Vitamin D deficiency   Relevant Orders   VITAMIN D 25 Hydroxy (Vit-D Deficiency, Fractures)   Intractable headache   Relevant Medications   FLUoxetine (PROZAC) 10 MG tablet    Meds ordered this encounter  Medications   FLUoxetine (PROZAC) 10 MG tablet    Sig: Take 1 tablet (10 mg total) by mouth daily for 7 days, THEN 2 tablets (20 mg total) daily.    Dispense:  90 tablet    Refill:  1     Follow-up: Return in about 6 weeks (around 07/30/2021).   Hopefully things will settle down for patient as Prozac becomes efficacious for her.  Scheduled for health maintenance catch-up.  May need advanced scanning of brain if headaches do not resolve with Prozac therapy. WLibby Maw MD

## 2021-06-19 ENCOUNTER — Telehealth: Payer: Self-pay | Admitting: Family Medicine

## 2021-06-19 NOTE — Telephone Encounter (Signed)
Called patient to go over labs, no answer LM with detailed message regarding labs also sent message via MyChart asked patient to give [place] a call with any questions.

## 2021-06-19 NOTE — Telephone Encounter (Signed)
Pt called and asked for a call back about lab results because she has questions, please advise

## 2021-06-20 ENCOUNTER — Other Ambulatory Visit: Payer: Self-pay | Admitting: Family Medicine

## 2021-06-20 ENCOUNTER — Telehealth: Payer: Self-pay | Admitting: Radiology

## 2021-06-20 DIAGNOSIS — Z1231 Encounter for screening mammogram for malignant neoplasm of breast: Secondary | ICD-10-CM

## 2021-06-20 NOTE — Telephone Encounter (Signed)
Left message for patient to call [facility] to schedule New GYN appointment from referral

## 2021-06-22 ENCOUNTER — Ambulatory Visit
Admission: RE | Admit: 2021-06-22 | Discharge: 2021-06-22 | Disposition: A | Discharge status: Home / Self Care | Payer: 59 | Source: Ambulatory Visit | Attending: Family Medicine | Admitting: Family Medicine

## 2021-06-22 DIAGNOSIS — Z1231 Encounter for screening mammogram for malignant neoplasm of breast: Secondary | ICD-10-CM

## 2021-06-28 ENCOUNTER — Encounter: Payer: 59 | Admitting: Obstetrics & Gynecology

## 2021-07-31 ENCOUNTER — Ambulatory Visit: Payer: 59 | Admitting: Family Medicine

## 2021-08-16 ENCOUNTER — Encounter: Payer: 59 | Admitting: Family Medicine

## 2021-08-24 ENCOUNTER — Ambulatory Visit: Payer: 59 | Admitting: Family Medicine

## 2021-08-30 ENCOUNTER — Ambulatory Visit: Payer: 59 | Admitting: Family Medicine

## 2021-09-03 ENCOUNTER — Other Ambulatory Visit: Payer: Self-pay

## 2021-09-03 ENCOUNTER — Emergency Department (HOSPITAL_BASED_OUTPATIENT_CLINIC_OR_DEPARTMENT_OTHER): Payer: 59 | Admitting: Anesthesiology

## 2021-09-03 ENCOUNTER — Emergency Department (HOSPITAL_BASED_OUTPATIENT_CLINIC_OR_DEPARTMENT_OTHER): Payer: 59

## 2021-09-03 ENCOUNTER — Ambulatory Visit (HOSPITAL_BASED_OUTPATIENT_CLINIC_OR_DEPARTMENT_OTHER)
Admission: EM | Admit: 2021-09-03 | Discharge: 2021-09-03 | Disposition: A | Discharge status: Home / Self Care | Payer: 59 | Attending: General Surgery | Admitting: General Surgery

## 2021-09-03 ENCOUNTER — Emergency Department (HOSPITAL_COMMUNITY): Payer: 59 | Admitting: Anesthesiology

## 2021-09-03 ENCOUNTER — Encounter (HOSPITAL_COMMUNITY)
Admission: EM | Disposition: A | Discharge status: Home / Self Care | Payer: Self-pay | Source: Home / Self Care | Attending: Emergency Medicine

## 2021-09-03 ENCOUNTER — Encounter (HOSPITAL_BASED_OUTPATIENT_CLINIC_OR_DEPARTMENT_OTHER): Payer: Self-pay

## 2021-09-03 DIAGNOSIS — F17210 Nicotine dependence, cigarettes, uncomplicated: Secondary | ICD-10-CM | POA: Diagnosis not present

## 2021-09-03 DIAGNOSIS — K353 Acute appendicitis with localized peritonitis, without perforation or gangrene: Secondary | ICD-10-CM | POA: Diagnosis not present

## 2021-09-03 DIAGNOSIS — Z98891 History of uterine scar from previous surgery: Secondary | ICD-10-CM | POA: Insufficient documentation

## 2021-09-03 DIAGNOSIS — R519 Headache, unspecified: Secondary | ICD-10-CM | POA: Insufficient documentation

## 2021-09-03 DIAGNOSIS — F32A Depression, unspecified: Secondary | ICD-10-CM | POA: Diagnosis not present

## 2021-09-03 DIAGNOSIS — K3580 Unspecified acute appendicitis: Secondary | ICD-10-CM

## 2021-09-03 DIAGNOSIS — Z8711 Personal history of peptic ulcer disease: Secondary | ICD-10-CM | POA: Insufficient documentation

## 2021-09-03 DIAGNOSIS — Z20822 Contact with and (suspected) exposure to covid-19: Secondary | ICD-10-CM | POA: Insufficient documentation

## 2021-09-03 HISTORY — PX: LAPAROSCOPIC APPENDECTOMY: SHX408

## 2021-09-03 LAB — COMPREHENSIVE METABOLIC PANEL
ALT: 30 U/L (ref 0–44)
AST: 21 U/L (ref 15–41)
Albumin: 4.3 g/dL (ref 3.5–5.0)
Alkaline Phosphatase: 86 U/L (ref 38–126)
Anion gap: 10 (ref 5–15)
BUN: 26 mg/dL — ABNORMAL HIGH (ref 6–20)
CO2: 27 mmol/L (ref 22–32)
Calcium: 9.9 mg/dL (ref 8.9–10.3)
Chloride: 99 mmol/L (ref 98–111)
Creatinine, Ser: 0.8 mg/dL (ref 0.44–1.00)
GFR, Estimated: >60 mL/min (ref >60)
Glucose, Bld: 121 mg/dL — ABNORMAL HIGH (ref 70–99)
Potassium: 4.2 mmol/L (ref 3.5–5.1)
Sodium: 136 mmol/L (ref 135–145)
Total Bilirubin: 0.5 mg/dL (ref 0.3–1.2)
Total Protein: 8.5 g/dL — ABNORMAL HIGH (ref 6.5–8.1)

## 2021-09-03 LAB — URINALYSIS, ROUTINE W REFLEX MICROSCOPIC
Bilirubin Urine: NEGATIVE
Glucose, UA: NEGATIVE mg/dL
Hgb urine dipstick: NEGATIVE
Ketones, ur: NEGATIVE mg/dL
Leukocytes,Ua: NEGATIVE
Nitrite: NEGATIVE
Protein, ur: NEGATIVE mg/dL
Specific Gravity, Urine: >1.03 — ABNORMAL HIGH (ref 1.005–1.030)
pH: 5.5 (ref 5.0–8.0)

## 2021-09-03 LAB — CBC
HCT: 44.6 % (ref 36.0–46.0)
Hemoglobin: 14.8 g/dL (ref 12.0–15.0)
MCH: 29.4 pg (ref 26.0–34.0)
MCHC: 33.2 g/dL (ref 30.0–36.0)
MCV: 88.7 fL (ref 80.0–100.0)
Platelets: 378 10*3/uL (ref 150–400)
RBC: 5.03 MIL/uL (ref 3.87–5.11)
RDW: 12.9 % (ref 11.5–15.5)
WBC: 8.1 10*3/uL (ref 4.0–10.5)
nRBC: 0 % (ref 0.0–0.2)

## 2021-09-03 LAB — RESP PANEL BY RT-PCR (FLU A&B, COVID) ARPGX2
Influenza A by PCR: NEGATIVE
Influenza B by PCR: NEGATIVE
SARS Coronavirus 2 by RT PCR: NEGATIVE

## 2021-09-03 LAB — LIPASE, BLOOD: Lipase: 39 U/L (ref 11–51)

## 2021-09-03 LAB — PREGNANCY, URINE: Preg Test, Ur: NEGATIVE

## 2021-09-03 SURGERY — APPENDECTOMY, LAPAROSCOPIC
Anesthesia: General | Site: Abdomen

## 2021-09-03 MED ORDER — MAGIC MOUTHWASH
15.0000 mL | Freq: Four times a day (QID) | ORAL | Status: DC | PRN
  Filled 2021-09-03: qty 15

## 2021-09-03 MED ORDER — METRONIDAZOLE 500 MG/100ML IV SOLN
500.0000 mg | Freq: Two times a day (BID) | INTRAVENOUS | Status: DC
  Administered 2021-09-03: 500 mg via INTRAVENOUS
  Filled 2021-09-03: qty 100

## 2021-09-03 MED ORDER — HYDROCODONE-ACETAMINOPHEN 5-325 MG PO TABS: 1.0000 | ORAL_TABLET | ORAL | 0 refills | Status: DC | PRN

## 2021-09-03 MED ORDER — SODIUM CHLORIDE 0.9 % IV SOLN
8.0000 mg | Freq: Four times a day (QID) | INTRAVENOUS | Status: DC | PRN
  Filled 2021-09-03: qty 4

## 2021-09-03 MED ORDER — HYDROMORPHONE HCL 1 MG/ML IJ SOLN: 0.5000 mg | INTRAMUSCULAR | Status: DC | PRN

## 2021-09-03 MED ORDER — CIPROFLOXACIN IN D5W 400 MG/200ML IV SOLN
400.0000 mg | Freq: Two times a day (BID) | INTRAVENOUS | Status: DC
  Administered 2021-09-03: 400 mg via INTRAVENOUS
  Filled 2021-09-03: qty 200

## 2021-09-03 MED ORDER — DEXAMETHASONE SODIUM PHOSPHATE 10 MG/ML IJ SOLN
INTRAMUSCULAR | Status: DC | PRN
  Administered 2021-09-03: 10 mg via INTRAVENOUS

## 2021-09-03 MED ORDER — CHLORHEXIDINE GLUCONATE 0.12 % MT SOLN
15.0000 mL | OROMUCOSAL | Status: AC
  Administered 2021-09-03: 15 mL via OROMUCOSAL

## 2021-09-03 MED ORDER — FENTANYL CITRATE (PF) 100 MCG/2ML IJ SOLN
INTRAMUSCULAR | Status: AC
  Filled 2021-09-03: qty 2

## 2021-09-03 MED ORDER — MENTHOL 3 MG MT LOZG
1.0000 | LOZENGE | OROMUCOSAL | Status: DC | PRN
  Filled 2021-09-03: qty 9

## 2021-09-03 MED ORDER — ACETAMINOPHEN 500 MG PO TABS
1000.0000 mg | ORAL_TABLET | Freq: Once | ORAL | Status: AC
  Administered 2021-09-03: 1000 mg via ORAL
  Filled 2021-09-03: qty 2

## 2021-09-03 MED ORDER — BUPIVACAINE-EPINEPHRINE 0.25% -1:200000 IJ SOLN
INTRAMUSCULAR | Status: DC | PRN
  Administered 2021-09-03: 7 mL

## 2021-09-03 MED ORDER — FENTANYL CITRATE PF 50 MCG/ML IJ SOSY
25.0000 ug | PREFILLED_SYRINGE | INTRAMUSCULAR | Status: DC | PRN
  Administered 2021-09-03: 50 ug via INTRAVENOUS

## 2021-09-03 MED ORDER — METHOCARBAMOL 1000 MG/10ML IJ SOLN
1000.0000 mg | Freq: Four times a day (QID) | INTRAVENOUS | Status: DC | PRN
  Filled 2021-09-03: qty 10

## 2021-09-03 MED ORDER — FENTANYL CITRATE (PF) 100 MCG/2ML IJ SOLN
INTRAMUSCULAR | Status: DC | PRN
Start: 2021-09-03 — End: 2021-09-03
  Administered 2021-09-03: 50 ug via INTRAVENOUS
  Administered 2021-09-03: 100 ug via INTRAVENOUS
  Administered 2021-09-03: 50 ug via INTRAVENOUS

## 2021-09-03 MED ORDER — LACTATED RINGERS IV BOLUS: 1000.0000 mL | Freq: Three times a day (TID) | INTRAVENOUS | Status: DC | PRN

## 2021-09-03 MED ORDER — BUPIVACAINE-EPINEPHRINE (PF) 0.25% -1:200000 IJ SOLN
INTRAMUSCULAR | Status: AC
  Filled 2021-09-03: qty 30

## 2021-09-03 MED ORDER — IOHEXOL 300 MG/ML  SOLN
100.0000 mL | Freq: Once | INTRAMUSCULAR | Status: AC | PRN
  Administered 2021-09-03: 100 mL via INTRAVENOUS

## 2021-09-03 MED ORDER — LIDOCAINE 2% (20 MG/ML) 5 ML SYRINGE
INTRAMUSCULAR | Status: DC | PRN
Start: 2021-09-03 — End: 2021-09-03
  Administered 2021-09-03: 60 mg via INTRAVENOUS

## 2021-09-03 MED ORDER — ROCURONIUM BROMIDE 10 MG/ML (PF) SYRINGE
PREFILLED_SYRINGE | INTRAVENOUS | Status: DC | PRN
  Administered 2021-09-03: 50 mg via INTRAVENOUS

## 2021-09-03 MED ORDER — DEXAMETHASONE SODIUM PHOSPHATE 10 MG/ML IJ SOLN
INTRAMUSCULAR | Status: AC
  Filled 2021-09-03: qty 1

## 2021-09-03 MED ORDER — ONDANSETRON HCL 4 MG/2ML IJ SOLN
4.0000 mg | Freq: Once | INTRAMUSCULAR | Status: AC
  Administered 2021-09-03: 4 mg via INTRAVENOUS
  Filled 2021-09-03: qty 2

## 2021-09-03 MED ORDER — ONDANSETRON HCL 4 MG/2ML IJ SOLN
INTRAMUSCULAR | Status: DC | PRN
  Administered 2021-09-03: 4 mg via INTRAVENOUS

## 2021-09-03 MED ORDER — PROPOFOL 10 MG/ML IV BOLUS
INTRAVENOUS | Status: AC
  Filled 2021-09-03: qty 20

## 2021-09-03 MED ORDER — ONDANSETRON HCL 4 MG/2ML IJ SOLN: 4.0000 mg | Freq: Four times a day (QID) | INTRAMUSCULAR | Status: DC | PRN

## 2021-09-03 MED ORDER — LIP MEDEX EX OINT
1.0000 | TOPICAL_OINTMENT | Freq: Two times a day (BID) | CUTANEOUS | Status: DC
Start: 2021-09-03 — End: 2021-09-03

## 2021-09-03 MED ORDER — PHENOL 1.4 % MT LIQD
2.0000 | OROMUCOSAL | Status: DC | PRN
  Filled 2021-09-03: qty 177

## 2021-09-03 MED ORDER — LACTATED RINGERS IV SOLN: INTRAVENOUS | Status: DC

## 2021-09-03 MED ORDER — MIDAZOLAM HCL 5 MG/5ML IJ SOLN
INTRAMUSCULAR | Status: DC | PRN
  Administered 2021-09-03: 2 mg via INTRAVENOUS

## 2021-09-03 MED ORDER — DIPHENHYDRAMINE HCL 50 MG/ML IJ SOLN: 12.5000 mg | Freq: Four times a day (QID) | INTRAMUSCULAR | Status: DC | PRN

## 2021-09-03 MED ORDER — PROMETHAZINE HCL 25 MG/ML IJ SOLN: 6.2500 mg | INTRAMUSCULAR | Status: DC | PRN

## 2021-09-03 MED ORDER — ALUM & MAG HYDROXIDE-SIMETH 200-200-20 MG/5ML PO SUSP: 30.0000 mL | Freq: Four times a day (QID) | ORAL | Status: DC | PRN

## 2021-09-03 MED ORDER — LACTATED RINGERS IV BOLUS: 1000.0000 mL | Freq: Once | INTRAVENOUS | Status: DC

## 2021-09-03 MED ORDER — SODIUM CHLORIDE 0.9 % IR SOLN
Status: DC | PRN
  Administered 2021-09-03: 1000 mL

## 2021-09-03 MED ORDER — SODIUM CHLORIDE 0.9 % IV BOLUS
1000.0000 mL | Freq: Once | INTRAVENOUS | Status: AC
  Administered 2021-09-03: 1000 mL via INTRAVENOUS

## 2021-09-03 MED ORDER — HYDROCODONE-ACETAMINOPHEN 7.5-325 MG PO TABS: 1.0000 | ORAL_TABLET | Freq: Four times a day (QID) | ORAL | 0 refills | Status: DC | PRN

## 2021-09-03 MED ORDER — ONDANSETRON HCL 4 MG/2ML IJ SOLN
INTRAMUSCULAR | Status: AC
  Filled 2021-09-03: qty 2

## 2021-09-03 MED ORDER — FENTANYL CITRATE PF 50 MCG/ML IJ SOSY
PREFILLED_SYRINGE | INTRAMUSCULAR | Status: AC
  Filled 2021-09-03: qty 1

## 2021-09-03 MED ORDER — MIDAZOLAM HCL 2 MG/2ML IJ SOLN
INTRAMUSCULAR | Status: AC
  Filled 2021-09-03: qty 2

## 2021-09-03 MED ORDER — PROPOFOL 10 MG/ML IV BOLUS
INTRAVENOUS | Status: DC | PRN
  Administered 2021-09-03: 150 mg via INTRAVENOUS

## 2021-09-03 MED ORDER — ONDANSETRON HCL 4 MG PO TABS: 4.0000 mg | ORAL_TABLET | Freq: Every day | ORAL | 1 refills | Status: DC | PRN

## 2021-09-03 MED ORDER — PROCHLORPERAZINE EDISYLATE 10 MG/2ML IJ SOLN: 5.0000 mg | INTRAMUSCULAR | Status: DC | PRN

## 2021-09-03 SURGICAL SUPPLY — 35 items
APPLIER CLIP 5 13 M/L LIGAMAX5 (MISCELLANEOUS)
APPLIER CLIP ROT 10 11.4 M/L (STAPLE)
BAG COUNTER SPONGE SURGICOUNT (BAG) IMPLANT
CABLE HIGH FREQUENCY MONO STRZ (ELECTRODE) ×1 IMPLANT
CHLORAPREP W/TINT 26 (MISCELLANEOUS) ×2 IMPLANT
CLIP APPLIE 5 13 M/L LIGAMAX5 (MISCELLANEOUS) IMPLANT
CLIP APPLIE ROT 10 11.4 M/L (STAPLE) IMPLANT
CLIP LIGATING HEMO LOK XL GOLD (MISCELLANEOUS) ×2 IMPLANT
COVER TRANSDUCER ULTRASND (DRAPES) ×1 IMPLANT
DERMABOND ADVANCED (GAUZE/BANDAGES/DRESSINGS) ×1
DERMABOND ADVANCED .7 DNX12 (GAUZE/BANDAGES/DRESSINGS) ×1 IMPLANT
DEVICE TROCAR PUNCTURE CLOSURE (ENDOMECHANICALS) ×2 IMPLANT
ELECT REM PT RETURN 15FT ADLT (MISCELLANEOUS) ×2 IMPLANT
ENDOLOOP SUT PDS II  0 18 (SUTURE)
ENDOLOOP SUT PDS II 0 18 (SUTURE) IMPLANT
GLOVE SURG ENC MOIS LTX SZ7.5 (GLOVE) ×2 IMPLANT
GOWN STRL REUS W/TWL XL LVL3 (GOWN DISPOSABLE) ×4 IMPLANT
IRRIG SUCT STRYKERFLOW 2 WTIP (MISCELLANEOUS) ×2
IRRIGATION SUCT STRKRFLW 2 WTP (MISCELLANEOUS) ×1 IMPLANT
KIT BASIN OR (CUSTOM PROCEDURE TRAY) ×2 IMPLANT
KIT TURNOVER KIT A (KITS) IMPLANT
NDL INSUFFLATION 14GA 120MM (NEEDLE) ×1 IMPLANT
NEEDLE INSUFFLATION 14GA 120MM (NEEDLE) ×2 IMPLANT
PENCIL SMOKE EVACUATOR (MISCELLANEOUS) IMPLANT
SCISSORS LAP 5X35 DISP (ENDOMECHANICALS) ×2 IMPLANT
SET TUBE SMOKE EVAC HIGH FLOW (TUBING) ×2 IMPLANT
SLEEVE XCEL OPT CAN 5 100 (ENDOMECHANICALS) ×2 IMPLANT
SPIKE FLUID TRANSFER (MISCELLANEOUS) ×1 IMPLANT
SUT MNCRL AB 4-0 PS2 18 (SUTURE) ×2 IMPLANT
TOWEL OR 17X26 10 PK STRL BLUE (TOWEL DISPOSABLE) ×2 IMPLANT
TOWEL OR NON WOVEN STRL DISP B (DISPOSABLE) ×2 IMPLANT
TRAY FOLEY MTR SLVR 16FR STAT (SET/KITS/TRAYS/PACK) IMPLANT
TRAY LAPAROSCOPIC (CUSTOM PROCEDURE TRAY) ×2 IMPLANT
TROCAR BLADELESS OPT 5 100 (ENDOMECHANICALS) ×2 IMPLANT
TROCAR XCEL NON-BLD 11X100MML (ENDOMECHANICALS) ×2 IMPLANT

## 2021-09-03 NOTE — Anesthesia Preprocedure Evaluation (Addendum)
Anesthesia Evaluation  Patient identified by MRN, date of birth, ID band Patient awake    Reviewed: Allergy & Precautions, NPO status , Patient's Chart, lab work & pertinent test results  Airway Mallampati: II  TM Distance: >3 FB Neck ROM: Full    Dental  (+) Teeth Intact, Dental Advisory Given, Chipped,    Pulmonary Current Smoker and Patient abstained from smoking.,    Pulmonary exam normal breath sounds clear to auscultation       Cardiovascular negative cardio ROS Normal cardiovascular exam Rhythm:Regular Rate:Normal     Neuro/Psych  Headaches, Seizures -,  PSYCHIATRIC DISORDERS Depression  Neuromuscular disease    GI/Hepatic Neg liver ROS, PUD, Appendicitis    Endo/Other  Obesity   Renal/GU negative Renal ROS     Musculoskeletal  (+) Arthritis ,   Abdominal   Peds  Hematology negative hematology ROS (+)   Anesthesia Other Findings Day of surgery medications reviewed with the patient.  Reproductive/Obstetrics                            Anesthesia Physical Anesthesia Plan  ASA: 2 and emergent  Anesthesia Plan: General   Post-op Pain Management: Tylenol PO (pre-op)*   Induction: Intravenous and Rapid sequence  PONV Risk Score and Plan: 2 and Midazolam, Dexamethasone and Ondansetron  Airway Management Planned: Oral ETT  Additional Equipment:   Intra-op Plan:   Post-operative Plan: Extubation in OR  Informed Consent: I have reviewed the patients History and Physical, chart, labs and discussed the procedure including the risks, benefits and alternatives for the proposed anesthesia with the patient or authorized representative who has indicated his/her understanding and acceptance.     Dental advisory given  Plan Discussed with: CRNA  Anesthesia Plan Comments:        Anesthesia Quick Evaluation

## 2021-09-03 NOTE — ED Triage Notes (Addendum)
Pt c/o mid/ lower abd pain x 3 days-intermittent fevers-states pain is worse when she eats-NAD-steady gait

## 2021-09-03 NOTE — Transfer of Care (Signed)
Immediate Anesthesia Transfer of Care Note  Patient: Anne Lewis  Procedure(s) Performed: APPENDECTOMY LAPAROSCOPIC (Abdomen)  Patient Location: PACU  Anesthesia Type:General  Level of Consciousness: awake, alert  and oriented  Airway & Oxygen Therapy: Patient Spontanous Breathing and Patient connected to face mask oxygen  Post-op Assessment: Report given to RN and Post -op Vital signs reviewed and stable  Post vital signs: Reviewed and stable  Last Vitals:  Vitals Value Taken Time  BP 128/98 09/03/21 1930  Temp    Pulse 101 09/03/21 1931  Resp 13 09/03/21 1931  SpO2 99 % 09/03/21 1931  Vitals shown include unvalidated device data.  Last Pain:  Vitals:   09/03/21 1839  TempSrc:   PainSc: 8       Patients Stated Pain Goal: 0 (43/32/95 1884)  Complications: No notable events documented.

## 2021-09-03 NOTE — ED Notes (Signed)
Report to [place] and [facility] dr Eulis Foster accepting

## 2021-09-03 NOTE — H&P (Signed)
Anne Lewis is an 56 y.o. female.   Chief Complaint: Abdominal pain HPI: Patient is a 56 year old female, who comes in secondary to abdominal pain over the last 2 weeks.  Patient states that she had pain significantly changed on Friday.  She states that she was doing some heavy lifting and felt that it was due to this.  She states that she took some MiraLAX and Gas-X without any relief.  She states that she did have some abdominal distention at the time.  She states that she did have a temperature of 101.9 on Friday.  She states that thereafter she had no further fevers.  She denies any diarrhea.  She states that the pain subsided after Saturday night, however was continued through Monday.  She states that she was sent to the ER for further evaluation.  Upon evaluation the ER she underwent CT scan and laboratory studies.  CT scan was significant for acute appendicitis, no perforation.  I did review the CT scan personally.  Patient also with no leukocytosis.  Patient was transferred to [facility] for further evaluation and management.  Patient's had previous C-section in the past.  Past Medical History:  Diagnosis Date   BRCA negative 11/2013   H pylori ulcer    Ulcer of gastric fundus     Past Surgical History:  Procedure Laterality Date   CESAREAN SECTION  1610,[id number]   ENDOMETRIAL CRYOABLATION  12.19.2012   HER OPTION - office performed   TONSILLECTOMY  1987    Family History  Problem Relation Age of Onset   Hypertension Mother    Cancer Mother 46       intestational and ovarian   Diverticulitis Mother    Diabetes Father    Hypertension Father    Heart disease Father    Cancer Sister 9       ovarian and intestional   Cancer Brother 24       GI cancer   Breast cancer Maternal Aunt 77   Cancer Paternal Aunt 73       stomach   Cancer Maternal Grandmother 79       ovarian and pancreatic   Cancer Maternal Grandfather 44       pancreatic   Cancer Cousin 39        ovarian   Cancer Cousin 42       breast   Social History:  reports that she has been smoking cigarettes. She has never used smokeless tobacco. She reports current alcohol use. She reports that she does not use drugs.  Allergies:  Allergies  Allergen Reactions   Aspirin Swelling and Other (See Comments)    Bleeding   Augmentin [Amoxicillin-Pot Clavulanate] Swelling   Oxycodone Nausea And Vomiting    Medications Prior to Admission  Medication Sig Dispense Refill   diphenhydrAMINE (BENADRYL) 25 mg capsule Take 25 mg by mouth every 6 (six) hours as needed.     naproxen sodium (ALEVE) 220 MG tablet Take 440 mg by mouth daily as needed.     Vitamin D, Ergocalciferol, (DRISDOL) 1.25 MG (50000 UNIT) CAPS capsule Take 1 capsule (50,000 Units total) by mouth every 7 (seven) days. 5 capsule 5   FLUoxetine (PROZAC) 10 MG tablet Take 1 tablet (10 mg total) by mouth daily for 7 days, THEN 2 tablets (20 mg total) daily. 90 tablet 1   ZOLMitriptan (ZOMIG) 2.5 MG tablet Take 1 tablet (2.5 mg total) by mouth once for 1 dose. May repeat in 2  hours if headache persists or recurs. 10 tablet 1    Results for orders placed or performed during the hospital encounter of 09/03/21 (from the past 48 hour(s))  Urinalysis, Routine w reflex microscopic Urine, Clean Catch     Status: Abnormal   Collection Time: 09/03/21  1:20 PM  Result Value Ref Range   Color, Urine YELLOW YELLOW   APPearance CLEAR CLEAR   Specific Gravity, Urine >1.030 (H) 1.005 - 1.030   pH 5.5 5.0 - 8.0   Glucose, UA NEGATIVE NEGATIVE mg/dL   Hgb urine dipstick NEGATIVE NEGATIVE   Bilirubin Urine NEGATIVE NEGATIVE   Ketones, ur NEGATIVE NEGATIVE mg/dL   Protein, ur NEGATIVE NEGATIVE mg/dL   Nitrite NEGATIVE NEGATIVE   Leukocytes,Ua NEGATIVE NEGATIVE    Comment: Microscopic not done on urines with negative protein, blood, leukocytes, nitrite, or glucose < 500 mg/dL. Performed at [facility], [address] [place],  [place] [address]   Pregnancy, urine     Status: None   Collection Time: 09/03/21  1:20 PM  Result Value Ref Range   Preg Test, Ur NEGATIVE NEGATIVE    Comment:        THE SENSITIVITY OF THIS METHODOLOGY IS >20 mIU/mL. Performed at [facility], [address] [place], [place] [address]   Lipase, blood     Status: None   Collection Time: 09/03/21  1:27 PM  Result Value Ref Range   Lipase 39 11 - 51 U/L    Comment: Performed at [facility], [address] [place], [place] [address]  Comprehensive metabolic panel     Status: Abnormal   Collection Time: 09/03/21  1:27 PM  Result Value Ref Range   Sodium 136 135 - 145 mmol/L   Potassium 4.2 3.5 - 5.1 mmol/L   Chloride 99 98 - 111 mmol/L   CO2 27 22 - 32 mmol/L   Glucose, Bld 121 (H) 70 - 99 mg/dL    Comment: Glucose reference range applies only to samples taken after fasting for at least 8 hours.   BUN 26 (H) 6 - 20 mg/dL   Creatinine, Ser 0.80 0.44 - 1.00 mg/dL   Calcium 9.9 8.9 - 10.3 mg/dL   Total Protein 8.5 (H) 6.5 - 8.1 g/dL   Albumin 4.3 3.5 - 5.0 g/dL   AST 21 15 - 41 U/L   ALT 30 0 - 44 U/L   Alkaline Phosphatase 86 38 - 126 U/L   Total Bilirubin 0.5 0.3 - 1.2 mg/dL   GFR, Estimated >60 >60 mL/min    Comment: (NOTE) Calculated using the CKD-EPI Creatinine Equation (2021)    Anion gap 10 5 - 15    Comment: Performed at [facility], [address] [place], [place] [address]  CBC     Status: None   Collection Time: 09/03/21  1:27 PM  Result Value Ref Range   WBC 8.1 4.0 - 10.5 K/uL   RBC 5.03 3.87 - 5.11 MIL/uL   Hemoglobin 14.8 12.0 - 15.0 g/dL   HCT 44.6 36.0 - 46.0 %   MCV 88.7 80.0 - 100.0 fL   MCH 29.4 26.0 - 34.0 pg   MCHC 33.2 30.0 - 36.0 g/dL   RDW 12.9 11.5 - 15.5 %   Platelets 378 150 - 400 K/uL   nRBC 0.0 0.0 - 0.2 %    Comment: Performed at [facility], [address] [place], [place] [address]   CT Abdomen Pelvis W Contrast  Result Date:  09/03/2021 CLINICAL DATA:  Acute lower abdominal pain. EXAM: CT ABDOMEN AND PELVIS WITH CONTRAST TECHNIQUE: Multidetector CT imaging of the abdomen and pelvis was performed using the standard protocol following bolus administration of intravenous contrast. RADIATION DOSE REDUCTION: This exam was performed according to the departmental dose-optimization program which includes automated exposure control, adjustment of the mA and/or kV according to patient size and/or use of iterative reconstruction technique. CONTRAST:  178m OMNIPAQUE IOHEXOL 300 MG/ML  SOLN COMPARISON:  None. FINDINGS: Lower chest: No acute abnormality. Hepatobiliary: No focal liver abnormality is seen. No gallstones, gallbladder wall thickening, or biliary dilatation. Pancreas: Unremarkable. No pancreatic ductal dilatation or surrounding inflammatory changes. Spleen: Normal in size without focal abnormality. Adrenals/Urinary Tract: Adrenal glands are unremarkable. Kidneys are normal, without renal calculi, focal lesion, or hydronephrosis. Bladder is unremarkable. Stomach/Bowel: The stomach appears normal. There is no evidence of bowel obstruction. The appendix is enlarged with surrounding inflammation consistent with acute appendicitis. No definite abscess is noted. Vascular/Lymphatic: Aortic atherosclerosis. No enlarged abdominal or pelvic lymph nodes. Reproductive: Uterus and bilateral adnexa are unremarkable. Other: No abdominal wall hernia or abnormality. No abdominopelvic ascites. Musculoskeletal: No acute or significant osseous findings. IMPRESSION: Findings consistent with acute appendicitis. No definite abscess formation is noted. Aortic Atherosclerosis (ICD10-I70.0). Electronically Signed   By: JMarijo ConceptionM.D.   On: 09/03/2021 16:09    Review of Systems  Constitutional: Negative.   HENT: Negative.    Respiratory: Negative.    Cardiovascular: Negative.   Gastrointestinal:  Positive for abdominal distention and abdominal pain.   Neurological: Negative.   All other systems reviewed and are negative.  Blood pressure 111/74, pulse 77, temperature 97.6 F (36.4 C), temperature source Oral, resp. rate 17, height '5\' 9"'  (1.753 m), weight 96.6 kg, last menstrual period 03/14/2018, SpO2 98 %. Physical Exam Constitutional:      Appearance: She is well-developed.     Comments: Conversant No acute distress  HENT:     Head: Normocephalic and atraumatic.  Eyes:     General: Lids are normal. No scleral icterus.    Pupils: Pupils are equal, round, and reactive to light.     Comments: Pupils are equal round and reactive No lid lag Moist conjunctiva  Neck:     Thyroid: No thyromegaly.     Trachea: No tracheal tenderness.     Comments: No cervical lymphadenopathy Cardiovascular:     Rate and Rhythm: Normal rate and regular rhythm.     Heart sounds: No murmur heard. Pulmonary:     Effort: Pulmonary effort is normal.     Breath sounds: Normal breath sounds. No wheezing or rales.  Abdominal:     Tenderness: There is abdominal tenderness in the right lower quadrant and left lower quadrant. There is no guarding or rebound. Negative signs include Rovsing's sign and McBurney's sign.     Hernia: No hernia is present.  Musculoskeletal:     Cervical back: Normal range of motion and neck supple.  Skin:    General: Skin is warm.     Findings: No rash.     Nails: There is no clubbing.     Comments: Normal skin turgor  Neurological:     Mental Status: She is alert and oriented to person, place, and time.     Comments: Normal gait and station  Psychiatric:        Mood and Affect: Mood normal.  Thought Content: Thought content normal.        Judgment: Judgment normal.     Comments: Appropriate affect     Assessment/Plan 56 year old female with acute appendicitis 1.  We will proceed to the operating for an urgent lap appendectomy. 2.I discussed with the patient the risks benefits of the procedure to include but not  limited to: Infection, bleeding, damage to surrounding structures, possible ileus, possible postoperative infection. Patient voiced understanding and wishes to proceed.   High level medical decision making.  Ralene Ok, MD 09/03/2021, 6:35 PM

## 2021-09-03 NOTE — Anesthesia Procedure Notes (Signed)
Procedure Name: Intubation Date/Time: 09/03/2021 7:03 PM Performed by: Gean Maidens, CRNA Pre-anesthesia Checklist: Patient identified, Emergency Drugs available, Suction available, Patient being monitored and Timeout performed Patient Re-evaluated:Patient Re-evaluated prior to induction Oxygen Delivery Method: Circle system utilized Preoxygenation: Pre-oxygenation with 100% oxygen Induction Type: IV induction Ventilation: Mask ventilation without difficulty Laryngoscope Size: Mac and 3 Grade View: Grade II Tube type: Oral Tube size: 7.0 mm Number of attempts: 1 Airway Equipment and Method: Stylet Placement Confirmation: ETT inserted through vocal cords under direct vision, positive ETCO2 and breath sounds checked- equal and bilateral Secured at: 21 cm Tube secured with: Tape Dental Injury: Teeth and Oropharynx as per pre-operative assessment

## 2021-09-03 NOTE — Op Note (Signed)
09/03/2021  7:23 PM  PATIENT:  Anne Lewis  56 y.o. female  PRE-OPERATIVE DIAGNOSIS:  ACUTE APPENDICITIS  POST-OPERATIVE DIAGNOSIS:  ACUTE APPENDICITIS  PROCEDURE:  Procedure(s): APPENDECTOMY LAPAROSCOPIC (N/A)  SURGEON:  Surgeon(s) and Role:    Ralene Ok, MD - Primary   ANESTHESIA:   local and general  EBL:  5 mL   BLOOD ADMINISTERED:none  DRAINS: none   LOCAL MEDICATIONS USED:  BUPIVICAINE   SPECIMEN:  Source of Specimen:  appendix  DISPOSITION OF SPECIMEN:  PATHOLOGY  COUNTS:  YES  TOURNIQUET:  * No tourniquets in log *  DICTATION: .Dragon Dictation  Complications: none  Counts: reported as correct x 2  Findings:  The patient had a acutely inflamed, non-perforated appendix  Specimen: Appendix  Indications for procedure:  The patient is a 56 year old female with a history of periumbilical pain localized in the right lower quadrant patient had a CT scan which revealed signs consistent with acute appendicitis the patient back in for laparoscopic appendectomy.  Details of the procedure:The patient was taken back to the operating room. The patient was placed in supine position with bilateral SCDs in place.   The patient was prepped and draped in the usual sterile fashion.  After appropriate anitbiotics were confirmed, a time-out was confirmed and all facts were verified.    A pneumoperitoneum of 14 mmHg was obtained via a Veress needle technique in the left lower quadrant quadrant.  A 5 mm trocar and 5 mm camera then placed intra-abdominally there is no injury to any intra-abdominal organs a 10 mm infraumbilical port was placed and direct visualization as was a 5 mm port in the suprapubic area.   The appendix was identified and seen to be non-perforated.  The appendix was cleaned down to the appendiceal base. The mesoappendix was then incised and the appendiceal artery was cauterized.  The the appendiceal base was clean.  A gold hemoclip was placed  proximallyx2 and one distally and the appendix was transected between these 2. A retrieval bag was then placed into the abdomen and the specimen placed in the bag. The appendiceal stump was cauterized. We evacuate the fluid from the pelvis until the effluent was clear.  The appendix and retrieval  bag was then retrieved via the supraumbilical port. #1 Vicryl was used to reapproximate the fascia at the umbilical port site x2. The skin was reapproximated all port sites 3-0 Monocryl subcuticular fashion. The skin was dressed with Dermabond.  The patient was awakened from general anesthesia was taken to recovery room in stable condition.     PLAN OF CARE: Discharge to home after PACU  PATIENT DISPOSITION:  PACU - hemodynamically stable.   Delay start of Pharmacological VTE agent (>24hrs) due to surgical blood loss or risk of bleeding: not applicable

## 2021-09-03 NOTE — ED Notes (Signed)
Pt arrived from [facility] for a surgical consult for an appendicitis.  Pt has been having abdominal pain since friday. Pt states that she was giving medications at [facility] that helped with nausea and abdominal pain. Pt is complaining of headache at this time

## 2021-09-03 NOTE — ED Provider Notes (Signed)
[facility] EMERGENCY DEPARTMENT Provider Note   CSN: [id number] Arrival date & time: 09/03/21  1159     History  Chief Complaint  Patient presents with   Abdominal Pain    Anne Lewis is a 56 y.o. female. With no pertinent past medical history who presents to the emergency department with abdominal pain.   Patient states that she has had bilateral lower abdominal pain for "the past few weeks." States that on Friday, she had been moving all week. States that when she finished up on Friday, she "all of a sudden had increased abdominal pain." States that she sat down, however this increased her pain. States she felt bloated, so unbuttoned her pants for relief. She went home Friday evening and had cold sweats and fever up to 101.9. States that since then she has continued to have intermittent increased abdominal pain, decreased PO intake, and nausea without vomiting. States that pain increases with eating. Also endorses increased GERD symptoms and increased burping. She called PCP this morning who instructed her to present to ED. She denies vomiting, diarrhea, constipation. Denies urinary symptoms. Denies new vaginal discharge.    Abdominal Pain Associated symptoms: fever and nausea   Associated symptoms: no chest pain, no constipation, no diarrhea, no dysuria, no shortness of breath, no vaginal discharge and no vomiting       Home Medications Prior to Admission medications   Medication Sig Start Date End Date Taking? Authorizing Provider  FLUoxetine (PROZAC) 10 MG tablet Take 1 tablet (10 mg total) by mouth daily for 7 days, THEN 2 tablets (20 mg total) daily. 06/18/21 08/09/21  Libby Maw, MD  Vitamin D, Ergocalciferol, (DRISDOL) 1.25 MG (50000 UNIT) CAPS capsule Take 1 capsule (50,000 Units total) by mouth every 7 (seven) days. 08/31/19   Libby Maw, MD  ZOLMitriptan (ZOMIG) 2.5 MG tablet Take 1 tablet (2.5 mg total) by mouth once for 1 dose. May  repeat in 2 hours if headache persists or recurs. 05/16/21 06/18/21  Haydee Salter, MD      Allergies    Aspirin, Augmentin [amoxicillin-pot clavulanate], and Oxycodone    Review of Systems   Review of Systems  Constitutional:  Positive for appetite change and fever.  Respiratory:  Negative for shortness of breath.   Cardiovascular:  Negative for chest pain.  Gastrointestinal:  Positive for abdominal distention, abdominal pain and nausea. Negative for blood in stool, constipation, diarrhea and vomiting.  Genitourinary:  Negative for difficulty urinating, dysuria, flank pain and vaginal discharge.  All other systems reviewed and are negative.  Physical Exam Updated Vital Signs BP (!) 125/100    Pulse 79    Temp 98.2 F (36.8 C) (Oral)    Resp 18    Ht 5\' 9"  (1.753 m)    Wt 96.6 kg    LMP 03/14/2018    SpO2 100%    BMI 31.45 kg/m  Physical Exam Vitals and nursing note reviewed.  Constitutional:      General: She is not in acute distress.    Appearance: Normal appearance. She is well-developed. She is not ill-appearing or toxic-appearing.  HENT:     Head: Normocephalic and atraumatic.     Mouth/Throat:     Mouth: Mucous membranes are moist.     Pharynx: Oropharynx is clear.  Eyes:     General: No scleral icterus.    Extraocular Movements: Extraocular movements intact.     Pupils: Pupils are equal, round, and reactive to light.  Cardiovascular:     Rate and Rhythm: Normal rate and regular rhythm.     Heart sounds: Normal heart sounds. No murmur heard. Pulmonary:     Effort: Pulmonary effort is normal. No respiratory distress.     Breath sounds: Normal breath sounds.  Abdominal:     General: Bowel sounds are normal. There is no distension.     Palpations: Abdomen is soft.     Tenderness: There is generalized abdominal tenderness. There is no right CVA tenderness, left CVA tenderness, guarding or rebound. Negative signs include Murphy's sign, Rovsing's sign and McBurney's sign.      Hernia: No hernia is present.  Musculoskeletal:        General: Normal range of motion.     Cervical back: Neck supple.  Skin:    General: Skin is warm and dry.     Capillary Refill: Capillary refill takes less than 2 seconds.  Neurological:     General: No focal deficit present.     Mental Status: She is alert and oriented to person, place, and time. Mental status is at baseline.  Psychiatric:        Mood and Affect: Mood normal.        Behavior: Behavior normal.        Thought Content: Thought content normal.        Judgment: Judgment normal.    ED Results / Procedures / Treatments   Labs (all labs ordered are listed, but only abnormal results are displayed) Labs Reviewed  COMPREHENSIVE METABOLIC PANEL - Abnormal; Notable for the following components:      Result Value   Glucose, Bld 121 (*)    BUN 26 (*)    Total Protein 8.5 (*)    All other components within normal limits  URINALYSIS, ROUTINE W REFLEX MICROSCOPIC - Abnormal; Notable for the following components:   Specific Gravity, Urine >1.030 (*)    All other components within normal limits  RESP PANEL BY RT-PCR (FLU A&B, COVID) ARPGX2  LIPASE, BLOOD  CBC  PREGNANCY, URINE   EKG None  Radiology CT Abdomen Pelvis W Contrast  Result Date: 09/03/2021 CLINICAL DATA:  Acute lower abdominal pain. EXAM: CT ABDOMEN AND PELVIS WITH CONTRAST TECHNIQUE: Multidetector CT imaging of the abdomen and pelvis was performed using the standard protocol following bolus administration of intravenous contrast. RADIATION DOSE REDUCTION: This exam was performed according to the departmental dose-optimization program which includes automated exposure control, adjustment of the mA and/or kV according to patient size and/or use of iterative reconstruction technique. CONTRAST:  171mL OMNIPAQUE IOHEXOL 300 MG/ML  SOLN COMPARISON:  None. FINDINGS: Lower chest: No acute abnormality. Hepatobiliary: No focal liver abnormality is seen. No  gallstones, gallbladder wall thickening, or biliary dilatation. Pancreas: Unremarkable. No pancreatic ductal dilatation or surrounding inflammatory changes. Spleen: Normal in size without focal abnormality. Adrenals/Urinary Tract: Adrenal glands are unremarkable. Kidneys are normal, without renal calculi, focal lesion, or hydronephrosis. Bladder is unremarkable. Stomach/Bowel: The stomach appears normal. There is no evidence of bowel obstruction. The appendix is enlarged with surrounding inflammation consistent with acute appendicitis. No definite abscess is noted. Vascular/Lymphatic: Aortic atherosclerosis. No enlarged abdominal or pelvic lymph nodes. Reproductive: Uterus and bilateral adnexa are unremarkable. Other: No abdominal wall hernia or abnormality. No abdominopelvic ascites. Musculoskeletal: No acute or significant osseous findings. IMPRESSION: Findings consistent with acute appendicitis. No definite abscess formation is noted. Aortic Atherosclerosis (ICD10-I70.0). Electronically Signed   By: Marijo Conception M.D.   On: 09/03/2021 16:09  Procedures Procedures   Medications Ordered in ED Medications  metroNIDAZOLE (FLAGYL) IVPB 500 mg (has no administration in time range)  ciprofloxacin (CIPRO) IVPB 400 mg (has no administration in time range)  lactated ringers bolus 1,000 mL (has no administration in time range)  lactated ringers bolus 1,000 mL (has no administration in time range)  HYDROmorphone (DILAUDID) injection 0.5-2 mg (has no administration in time range)  methocarbamol (ROBAXIN) 1,000 mg in dextrose 5 % 100 mL IVPB (has no administration in time range)  ondansetron (ZOFRAN) injection 4 mg (has no administration in time range)    Or  ondansetron (ZOFRAN) 8 mg in sodium chloride 0.9 % 50 mL IVPB (has no administration in time range)  prochlorperazine (COMPAZINE) injection 5-10 mg (has no administration in time range)  phenol (CHLORASEPTIC) mouth spray 2 spray (has no administration  in time range)  menthol-cetylpyridinium (CEPACOL) lozenge 3 mg (has no administration in time range)  magic mouthwash (has no administration in time range)  alum & mag hydroxide-simeth (MAALOX/MYLANTA) 200-200-20 MG/5ML suspension 30 mL (has no administration in time range)  diphenhydrAMINE (BENADRYL) injection 12.5-25 mg (has no administration in time range)  sodium chloride 0.9 % bolus 1,000 mL (1,000 mLs Intravenous New Bag/Given 09/03/21 1613)  ondansetron (ZOFRAN) injection 4 mg (4 mg Intravenous Given 09/03/21 1611)  iohexol (OMNIPAQUE) 300 MG/ML solution 100 mL (100 mLs Intravenous Contrast Given 09/03/21 1547)    ED Course/ Medical Decision Making/ A&P                           Medical Decision Making Amount and/or Complexity of Data Reviewed Labs: ordered. Radiology: ordered.  Risk Prescription drug management.  Patient presents to the ED with complaints of abdominal pain. This involves an extensive number of treatment options, and is a complaint that carries with it a high risk of complications and morbidity.   Additional history obtained:  Additional history obtained from: n/a External records from outside source obtained and reviewed including: n/a   Lab Results: I personally ordered, reviewed, and interpreted labs. Pertinent results include: CBC without leukocytosis  CMP without AKI, electrolyte derangement  Lipase 39, negative  UA without UTI   Imaging Studies ordered:  I ordered imaging studies which included CT.  I independently reviewed & interpreted imaging & am in agreement with radiology impression. Imaging shows: CT abdomen pelvis with contrast shows acute appendicitis without abscess or perforation   Medications  I ordered medication including IVF and zofran for volume resuscitation, nausea  Reevaluation of the patient after medication shows that patient improved  Critical Interventions: Surgery consult  Consultations: I requested consultation with  the general surgeon, Dr. Johney Maine at 1630,  and discussed lab and imaging findings as well as pertinent plan - they recommend: ED to ED transfer for surgery consult   ED Course: 56 year old female who presents to the emergency department with abdominal pain.  Abdominal exam without peritoneal signs.  There is no evidence of acute abdomen.  She is well-appearing.  No indication for pelvic exam at this time.  I have very low suspicion for PID or TOA.  Considered and doubt ovarian torsion given history and presentation.  Given labs and work-up I have low suspicion for acute hepatobiliary disease including acute cholecystitis, acute pancreatitis (lipase negative), peptic ulcer disease, gastric perforation, hepatitis, pyelonephritis, pneumonia, vascular catastrophe, bowel obstruction or viscus perforation, diverticulitis.  Given her ongoing pain without obvious cause I obtained a CT abdomen pelvis which  shows acute appendicitis.  This is consistent with her bilateral lower abdominal pain.  Again this is not an acute abdomen at this time.  It is tender without rebound tenderness.  There is no rigidity.  Given IV fluids and Zofran with improvement in nausea.  Discussed case with Dr. Johney Maine, general surgery who recommends ED to ED transfer at this time and surgery will consult at [facility].  Talked with Dr. Eulis Foster, attending ED physician at [facility] long emergency department to accept the patient for transfer.  In the meantime Dr. Johney Maine has placed orders for antibiotics, pain management.  We will continue to monitor her until she is transferred to [facility].    After consideration of the diagnostic results and the patients response to treatment, I feel that the patent would benefit from transfer to [facility] for surgical consultation. The patient has been appropriately medically screened and/or stabilized in the ED. I have low suspicion for any other emergent medical condition which would require further screening,  evaluation or treatment in the ED. Final Clinical Impression(s) / ED Diagnoses Final diagnoses:  Acute appendicitis with localized peritonitis, without perforation, abscess, or gangrene   Rx / DC Orders ED Discharge Orders     None         Mickie Hillier, PA-C 09/03/21 [place] A, DO 09/04/21 2045

## 2021-09-03 NOTE — ED Provider Notes (Signed)
She is arrived from the [facility] emergency department to be evaluated for appendicitis.  She reports having abdominal pain for several days with fever.  Contacted general surgery,   Daleen Bo, MD 09/04/21 1230

## 2021-09-04 ENCOUNTER — Encounter (HOSPITAL_COMMUNITY): Payer: Self-pay | Admitting: General Surgery

## 2021-09-04 ENCOUNTER — Ambulatory Visit: Payer: Self-pay | Admitting: Family Medicine

## 2021-09-04 NOTE — Anesthesia Postprocedure Evaluation (Signed)
Anesthesia Post Note  Patient: [name]  Procedure(s) Performed: APPENDECTOMY LAPAROSCOPIC (Abdomen)     Patient location during evaluation: PACU Anesthesia Type: General Level of consciousness: awake and alert Pain management: pain level controlled Vital Signs Assessment: post-procedure vital signs reviewed and stable Respiratory status: spontaneous breathing, nonlabored ventilation, respiratory function stable and patient connected to nasal cannula oxygen Cardiovascular status: blood pressure returned to baseline and stable Postop Assessment: no apparent nausea or vomiting Anesthetic complications: no   No notable events documented.  Last Vitals:  Vitals:   09/03/21 2045 09/03/21 2058  BP:  116/75  Pulse: 76 80  Resp:  16  Temp:  36.7 C  SpO2: 100% 100%    Last Pain:  Vitals:   09/03/21 2015  TempSrc:   PainSc: [place] Roshawnda [place]

## 2021-09-05 LAB — SURGICAL PATHOLOGY

## 2021-09-13 ENCOUNTER — Other Ambulatory Visit: Payer: Self-pay

## 2021-09-13 ENCOUNTER — Other Ambulatory Visit (HOSPITAL_COMMUNITY): Payer: Self-pay | Admitting: Student

## 2021-09-13 ENCOUNTER — Ambulatory Visit (HOSPITAL_COMMUNITY)
Admission: RE | Admit: 2021-09-13 | Discharge: 2021-09-13 | Disposition: A | Discharge status: Home / Self Care | Payer: 59 | Source: Ambulatory Visit | Attending: Student | Admitting: Student

## 2021-09-13 ENCOUNTER — Other Ambulatory Visit: Payer: Self-pay | Admitting: Student

## 2021-09-13 DIAGNOSIS — Z9889 Other specified postprocedural states: Secondary | ICD-10-CM | POA: Insufficient documentation

## 2021-09-13 DIAGNOSIS — K769 Liver disease, unspecified: Secondary | ICD-10-CM | POA: Insufficient documentation

## 2021-09-13 DIAGNOSIS — R161 Splenomegaly, not elsewhere classified: Secondary | ICD-10-CM | POA: Insufficient documentation

## 2021-09-13 DIAGNOSIS — I700 Atherosclerosis of aorta: Secondary | ICD-10-CM | POA: Insufficient documentation

## 2021-09-13 MED ORDER — IOHEXOL 300 MG/ML  SOLN
100.0000 mL | Freq: Once | INTRAMUSCULAR | Status: AC | PRN
  Administered 2021-09-13: 100 mL via INTRAVENOUS

## 2021-09-13 MED ORDER — SODIUM CHLORIDE (PF) 0.9 % IJ SOLN
INTRAMUSCULAR | Status: AC
  Filled 2021-09-13: qty 50

## 2021-09-13 MED ORDER — IOHEXOL 9 MG/ML PO SOLN: 1000.0000 mL | ORAL | Status: AC

## 2021-09-13 MED ORDER — IOHEXOL 9 MG/ML PO SOLN: 500.0000 mL | ORAL | Status: AC

## 2021-09-13 MED ORDER — IOHEXOL 9 MG/ML PO SOLN
ORAL | Status: AC
  Administered 2021-09-13: 1000 mL via ORAL
  Filled 2021-09-13: qty 1000

## 2021-09-14 ENCOUNTER — Ambulatory Visit: Payer: 59 | Admitting: Family Medicine

## 2021-09-17 ENCOUNTER — Telehealth: Payer: Self-pay

## 2021-09-17 NOTE — Telephone Encounter (Signed)
Spoke with patient who states that she had appendix removed on 09/13/21 still in lots of pain patient called to schedule appointment with general surgery they did not have any appointments until 10/02/21 patient concerned about burst spleen would like to be seen patient states that she would not like to be seen at ER appointment scheduled. Is this okay for patient to come in the office for evaluation? Please advise.

## 2021-09-17 NOTE — Telephone Encounter (Signed)
Patient aware that per Dr. Alric Seton to have a ruptured spleen. This soon after surgery, needs to go to ER or call surgeon on call for her doctor.

## 2021-09-18 ENCOUNTER — Ambulatory Visit: Payer: 59 | Admitting: Family Medicine

## 2021-09-18 ENCOUNTER — Other Ambulatory Visit: Payer: Self-pay | Admitting: General Surgery

## 2021-09-18 ENCOUNTER — Other Ambulatory Visit (HOSPITAL_COMMUNITY): Payer: Self-pay | Admitting: General Surgery

## 2021-09-18 DIAGNOSIS — Z9049 Acquired absence of other specified parts of digestive tract: Secondary | ICD-10-CM

## 2021-09-18 DIAGNOSIS — G8918 Other acute postprocedural pain: Secondary | ICD-10-CM

## 2021-09-19 ENCOUNTER — Other Ambulatory Visit: Payer: Self-pay

## 2021-09-19 ENCOUNTER — Ambulatory Visit (HOSPITAL_COMMUNITY)
Admission: RE | Admit: 2021-09-19 | Discharge: 2021-09-19 | Disposition: A | Discharge status: Home / Self Care | Payer: 59 | Source: Ambulatory Visit | Attending: General Surgery | Admitting: General Surgery

## 2021-09-19 DIAGNOSIS — G8918 Other acute postprocedural pain: Secondary | ICD-10-CM | POA: Insufficient documentation

## 2021-09-19 DIAGNOSIS — Z9049 Acquired absence of other specified parts of digestive tract: Secondary | ICD-10-CM | POA: Diagnosis present

## 2021-09-19 MED ORDER — TECHNETIUM TC 99M MEBROFENIN IV KIT
5.2000 | PACK | Freq: Once | INTRAVENOUS | Status: AC | PRN
  Administered 2021-09-19: 5.2 via INTRAVENOUS

## 2021-09-20 ENCOUNTER — Encounter: Payer: Self-pay | Admitting: Family Medicine

## 2021-10-10 ENCOUNTER — Other Ambulatory Visit: Payer: Self-pay | Admitting: Family Medicine

## 2021-10-10 DIAGNOSIS — R519 Headache, unspecified: Secondary | ICD-10-CM

## 2021-10-11 MED ORDER — ZOLMITRIPTAN 2.5 MG PO TABS: 2.5000 mg | ORAL_TABLET | Freq: Once | ORAL | 1 refills | Status: DC

## 2021-10-15 ENCOUNTER — Ambulatory Visit (INDEPENDENT_AMBULATORY_CARE_PROVIDER_SITE_OTHER): Payer: 59 | Admitting: Gastroenterology

## 2021-10-15 ENCOUNTER — Encounter: Payer: Self-pay | Admitting: Gastroenterology

## 2021-10-15 ENCOUNTER — Other Ambulatory Visit (INDEPENDENT_AMBULATORY_CARE_PROVIDER_SITE_OTHER): Payer: 59

## 2021-10-15 VITALS — BP 118/82 | HR 98 | Ht 69.5 in | Wt 208.0 lb

## 2021-10-15 DIAGNOSIS — R1084 Generalized abdominal pain: Secondary | ICD-10-CM

## 2021-10-15 LAB — COMPREHENSIVE METABOLIC PANEL
ALT: 18 U/L (ref 0–35)
AST: 13 U/L (ref 0–37)
Albumin: 4.5 g/dL (ref 3.5–5.2)
Alkaline Phosphatase: 88 U/L (ref 39–117)
BUN: 26 mg/dL — ABNORMAL HIGH (ref 6–23)
CO2: 27 mEq/L (ref 19–32)
Calcium: 10 mg/dL (ref 8.4–10.5)
Chloride: 102 mEq/L (ref 96–112)
Creatinine, Ser: 0.91 mg/dL (ref 0.40–1.20)
GFR: 70.82 mL/min (ref >60.00)
Glucose, Bld: 90 mg/dL (ref 70–99)
Potassium: 4.1 mEq/L (ref 3.5–5.1)
Sodium: 137 mEq/L (ref 135–145)
Total Bilirubin: 0.4 mg/dL (ref 0.2–1.2)
Total Protein: 7.8 g/dL (ref 6.0–8.3)

## 2021-10-15 LAB — CBC
HCT: 39.8 % (ref 36.0–46.0)
Hemoglobin: 13.5 g/dL (ref 12.0–15.0)
MCHC: 33.9 g/dL (ref 30.0–36.0)
MCV: 87.5 fl (ref 78.0–100.0)
Platelets: 359 10*3/uL (ref 150.0–400.0)
RBC: 4.55 Mil/uL (ref 3.87–5.11)
RDW: 13.2 % (ref 11.5–15.5)
WBC: 7.8 10*3/uL (ref 4.0–10.5)

## 2021-10-15 MED ORDER — HYOSCYAMINE SULFATE 0.125 MG SL SUBL: 0.1250 mg | SUBLINGUAL_TABLET | Freq: Four times a day (QID) | SUBLINGUAL | 0 refills | Status: DC | PRN

## 2021-10-15 NOTE — Progress Notes (Signed)
Review of gastrointestinal problems: ?1. Gastritis, H. pylori positive on EGD biopsies May 2011. Put on appropriate antibiotics ?2. routine risk for colon cancer, colonoscopy May 2011 found no polyps, recall colonoscopy at 10 year interval. ? ? ?HPI: ?This is a very pleasant 56 year old woman whom I last saw about 10 years ago.  She was referred by [facility] surgery. ? ?To update her respect for colon cancer above, her sister was diagnosed with colon cancer 5 or 6 years ago.  She was in her early 51s. ? ?She was having lower abdominal discomforts which eventually worsened and led to ER visit at which time she was diagnosed with acute appendicitis ? ? ?CT scan 08/2021 abdomen pelvis with IV and oral contrast indication "acute lower abdominal pain."  Findings "acute appendicitis ? ?She underwent laparoscopic appendectomy by Dr. Rosendo Gros September 03, 2021.  His op note describes acutely inflamed, nonperforated appendix. ? ?CT scan 2 weeks later abdomen pelvis with IV and oral contrast, indication abdominal pain.  Findings "no evidence of intestinal obstruction or pneumoperitoneum.  There is no hydronephrosis.  Enlarged fatty liver.  Possible 4 mm cyst or hemangioma in the liver.  Enlarged spleen." ? ?HIDA scan 09/2021 indication "2 weeks of right upper quadrant abdominal pain."  Findings patent cystic and common bile ducts, normal gallbladder ejection fraction. ? ?CBC 08/2021 completely normal, complete metabolic profile 01/8468 normal except for BUN 26, total protein 8.5. ? ?Following surgery she has not felt better.  In fact she has probably felt worse.  Not only she is bothered by lower abdominal discomforts but she is also bothered by upper abdominal discomfort.  This led to a repeat CT scan see above.  It also led to a HIDA scan, see above.  She tells me that eating is very uncomfortable, she is generally always having abdominal discomforts all day and then eating makes these worse.  She is not on NSAIDs.  She  is taking Carafate 4 times daily and it is not helping.  She does not have pyrosis.  She has no regurg or dysphagia.  She does have mild chronic constipation and takes MiraLAX which generally helps.  She has not seen any overt GI bleeding ? ?In the past year she has gained 80 pounds including 8 pounds in the past month or so. ? ? ? ?Review of systems: ?Pertinent positive and negative review of systems were noted in the above HPI section. All other review negative. ? ? ?Past Medical History:  ?Diagnosis Date  ? BRCA negative 11/2013  ? H pylori ulcer   ? Ulcer of gastric fundus   ? ? ?Past Surgical History:  ?Procedure Laterality Date  ? CESAREAN SECTION  [phone]  ? ENDOMETRIAL CRYOABLATION  12.19.2012  ? [facility] - office performed  ? LAPAROSCOPIC APPENDECTOMY N/A 09/03/2021  ? Procedure: APPENDECTOMY LAPAROSCOPIC;  Surgeon: Ralene Ok, MD;  Location: [facility] ORS;  Service: General;  Laterality: N/A;  ? TONSILLECTOMY  1987  ? ? ?Current Outpatient Medications  ?Medication Instructions  ? diphenhydrAMINE (BENADRYL) 25 mg, Oral, Every 6 hours PRN  ? naproxen sodium (ALEVE) 440 mg, Oral, Daily PRN  ? ondansetron (ZOFRAN) 4 mg, Oral, Daily PRN  ? sucralfate (CARAFATE) 1 g, Oral, 4 times daily  ? Vitamin D (Ergocalciferol) (DRISDOL) 50,000 Units, Oral, Every 7 days  ? ZOLMitriptan (ZOMIG) 2.5 mg, Oral,  Once, May repeat in 2 hours if headache persists or recurs.  ? ? ?Allergies as of 10/15/2021 - Review Complete 10/15/2021  ?Allergen  Reaction Noted  ? Aspirin Swelling and Other (See Comments) 05/28/2011  ? Augmentin [amoxicillin-pot clavulanate] Swelling 01/03/2018  ? Oxycodone Nausea And Vomiting 01/03/2018  ? ? ?Family History  ?Problem Relation Age of Onset  ? Hypertension Mother   ? Cancer Mother 87  ?     intestational and ovarian  ? Diverticulitis Mother   ? Diabetes Father   ? Hypertension Father   ? Heart disease Father   ? Cancer Sister 103  ?     ovarian and intestional  ? Cancer Brother 5  ?     GI cancer   ? Breast cancer Maternal Aunt 60  ? Cancer Paternal Aunt 76  ?     stomach  ? Cancer Maternal Grandmother 63  ?     ovarian and pancreatic  ? Cancer Maternal Grandfather 65  ?     pancreatic  ? Cancer Cousin 39  ?     ovarian  ? Cancer Cousin 81  ?     breast  ? ? ?Social History  ? ?Socioeconomic History  ? Marital status: Married  ?  Spouse name: Not on file  ? Number of children: Not on file  ? Years of education: Not on file  ? Highest education level: Not on file  ?Occupational History  ? Not on file  ?Tobacco Use  ? Smoking status: Some Days  ?  Types: Cigarettes  ? Smokeless tobacco: Never  ?Vaping Use  ? Vaping Use: Never used  ?Substance and Sexual Activity  ? Alcohol use: Yes  ?  Comment: occ  ? Drug use: No  ? Sexual activity: Yes  ?  Birth control/protection: Post-menopausal  ?Other Topics Concern  ? Not on file  ?Social History Narrative  ? Not on file  ? ?Social Determinants of Health  ? ?Financial Resource Strain: Not on file  ?Food Insecurity: Not on file  ?Transportation Needs: Not on file  ?Physical Activity: Not on file  ?Stress: Not on file  ?Social Connections: Not on file  ?Intimate Partner Violence: Not on file  ? ? ? ?Physical Exam: ?Ht 5' 9.5" (1.765 m)   Wt 208 lb (94.3 kg)   LMP 03/14/2018   BMI 30.28 kg/m?  ?Constitutional: generally well-appearing ?Psychiatric: alert and oriented x3 ?Eyes: extraocular movements intact ?Mouth: oral pharynx moist, no lesions ?Neck: supple no lymphadenopathy ?Cardiovascular: heart regular rate and rhythm ?Lungs: clear to auscultation bilaterally ?Abdomen: soft, mildly to moderately tender throughout her abdomen nondistended, no obvious ascites, no peritoneal signs, normal bowel sounds ?Extremities: no lower extremity edema bilaterally ?Skin: no lesions on visible extremities ? ? ?Assessment and plan: ?56 y.o. female with abdominal pains which have probably worsened since appendicitis 6 weeks ago ? ?Unclear etiology but I am struck by the fact that she  is fairly tender on examination today throughout her abdomen.  I think she needs further testing to try to sort this out and I am ordering a CT scan abdomen pelvis with IV and oral contrast as well as repeat blood work including a CBC, complete metabolic profile.  Also stool for H. pylori antigen testing.  She understands that if these tests are not helpful that she will probably require further testing which would be upper endoscopy plus minus colonoscopy which she is certainly due for given her newly known family history of colon cancer. ? ?I am calling in antispasm sublingual medicine to take on appearing basis for now as well. ? ?Please see the "Patient  Instructions" section for addition details about the plan. ? ? ?Owens Loffler, MD ?[facility] ?10/15/2021, 10:39 AM ? ?Cc: Libby Maw,* ? ?Total time on date of encounter was 45  minutes (this included time spent preparing to see the patient reviewing records; obtaining and/or reviewing separately obtained history; performing a medically appropriate exam and/or evaluation; counseling and educating the patient and family if present; ordering medications, tests or procedures if applicable; and documenting clinical information in the health record). ? ? ? ? ? ? ? ? ?

## 2021-10-15 NOTE — Patient Instructions (Addendum)
If you are age 56 or younger, your body mass index should be between 19-25. Your Body mass index is 30.28 kg/m?Marland Kitchen If this is out of the aformentioned range listed, please consider follow up with your Primary Care Provider.  ?________________________________________________________ ? ?The [facility] providers would like to encourage you to use [facility] to communicate with providers for non-urgent requests or questions.  Due to long hold times on the telephone, sending your provider a message by [facility] may be a faster and more efficient way to get a response.  Please allow 48 business hours for a response.  Please remember that this is for non-urgent requests.  ?_______________________________________________________ ? ?We have sent the following medications to your pharmacy for you to pick up at your convenience: ? ?START: Levsin 0.125 mg take one to two tablets sublingual (under the tongue) every 6 hours as needed for abdominal cramping. ? ?Your provider has requested that you go to the basement level for lab work before leaving today. Press "B" on the elevator. The lab is located at the first door on the left as you exit the elevator. ? ?Due to recent changes in healthcare laws, you may see the results of your imaging and laboratory studies on MyChart before your provider has had a chance to review them.  We understand that in some cases there may be results that are confusing or concerning to you. Not all laboratory results come back in the same time frame and the provider may be waiting for multiple results in order to interpret others.  Please give [place] 48 hours in order for your provider to thoroughly review all the results before contacting the office for clarification of your results. ? ?You have been scheduled for a CT scan of the abdomen and pelvis at [facility] ([address]---this is in the same building as [facility]).  ? ?You are scheduled on 10-23-21 at 11:30am.  You should arrive 15 minutes prior to your appointment time for registration. Please follow the written instructions below on the day of your exam: ? ?WARNING: IF YOU ARE ALLERGIC TO IODINE/X-RAY DYE, PLEASE NOTIFY RADIOLOGY IMMEDIATELY AT [phone]! YOU WILL BE GIVEN A 13 HOUR PREMEDICATION PREP. ? ?1) Do not eat or drink anything after 7:30am (4 hours prior to your test) ?2) You have been given 2 bottles of oral contrast to drink. The solution may taste better if refrigerated, but do NOT add ice or any other liquid to this solution. Shake well before drinking. ?  ? Drink 1 bottle of contrast @ 9:30am (2 hours prior to your exam) ? Drink 1 bottle of contrast @ 10:30am (1 hour prior to your exam) ? ?You may take any medications as prescribed with a small amount of water, if necessary. If you take any of the following medications: METFORMIN, GLUCOPHAGE, GLUCOVANCE, AVANDAMET, RIOMET, FORTAMET, ACTOPLUS MET, JANUMET, [place] or METAGLIP, you MAY be asked to HOLD this medication 48 hours AFTER the exam. ? ?The purpose of you drinking the oral contrast is to aid in the visualization of your intestinal tract. The contrast solution may cause some diarrhea. Depending on your individual set of symptoms, you may also receive an intravenous injection of x-ray contrast/dye. Plan on being at [facility] for 30 minutes or longer, depending on the type of exam you are having performed. ? ?This test typically takes 30-45 minutes to complete. ? ?If you have any questions regarding your exam or if you need to reschedule,  you may call the CT department at [phone] between the hours of 8:00 am and 5:00 pm, Monday-Friday. ? ?________________________________________________________________________ ? ?Thank you for entrusting me with your care and choosing [facility]. ? ?Dr Ardis Hughs ? ? ? ?

## 2021-10-23 ENCOUNTER — Ambulatory Visit (INDEPENDENT_AMBULATORY_CARE_PROVIDER_SITE_OTHER)
Admission: RE | Admit: 2021-10-23 | Discharge: 2021-10-23 | Disposition: A | Discharge status: Home / Self Care | Payer: 59 | Source: Ambulatory Visit | Attending: Gastroenterology | Admitting: Gastroenterology

## 2021-10-23 DIAGNOSIS — R1084 Generalized abdominal pain: Secondary | ICD-10-CM

## 2021-10-23 MED ORDER — IOHEXOL 300 MG/ML  SOLN
100.0000 mL | Freq: Once | INTRAMUSCULAR | Status: AC | PRN
  Administered 2021-10-23: 100 mL via INTRAVENOUS

## 2021-10-30 ENCOUNTER — Ambulatory Visit: Payer: 59 | Admitting: Family Medicine

## 2021-10-31 DIAGNOSIS — Z0289 Encounter for other administrative examinations: Secondary | ICD-10-CM

## 2021-11-07 ENCOUNTER — Ambulatory Visit (INDEPENDENT_AMBULATORY_CARE_PROVIDER_SITE_OTHER): Payer: 59 | Admitting: Family Medicine

## 2021-11-07 ENCOUNTER — Encounter (INDEPENDENT_AMBULATORY_CARE_PROVIDER_SITE_OTHER): Payer: Self-pay | Admitting: Family Medicine

## 2021-11-07 VITALS — BP 111/74 | HR 93 | Temp 98.1°F | Ht 68.0 in | Wt 207.0 lb

## 2021-11-07 DIAGNOSIS — E669 Obesity, unspecified: Secondary | ICD-10-CM

## 2021-11-07 DIAGNOSIS — E66811 Obesity, class 1: Secondary | ICD-10-CM

## 2021-11-07 DIAGNOSIS — R739 Hyperglycemia, unspecified: Secondary | ICD-10-CM

## 2021-11-07 DIAGNOSIS — Z8659 Personal history of other mental and behavioral disorders: Secondary | ICD-10-CM

## 2021-11-07 DIAGNOSIS — Z9189 Other specified personal risk factors, not elsewhere classified: Secondary | ICD-10-CM

## 2021-11-07 DIAGNOSIS — E538 Deficiency of other specified B group vitamins: Secondary | ICD-10-CM

## 2021-11-07 DIAGNOSIS — E559 Vitamin D deficiency, unspecified: Secondary | ICD-10-CM

## 2021-11-07 DIAGNOSIS — E7849 Other hyperlipidemia: Secondary | ICD-10-CM | POA: Diagnosis not present

## 2021-11-07 DIAGNOSIS — Z6831 Body mass index (BMI) 31.0-31.9, adult: Secondary | ICD-10-CM

## 2021-11-07 DIAGNOSIS — Z1331 Encounter for screening for depression: Secondary | ICD-10-CM

## 2021-11-07 DIAGNOSIS — R5383 Other fatigue: Secondary | ICD-10-CM | POA: Diagnosis not present

## 2021-11-07 DIAGNOSIS — R0602 Shortness of breath: Secondary | ICD-10-CM

## 2021-11-08 LAB — CBC WITH DIFFERENTIAL/PLATELET
Basophils Absolute: 0.1 10*3/uL (ref 0.0–0.2)
Basos: 1 %
EOS (ABSOLUTE): 0.3 10*3/uL (ref 0.0–0.4)
Eos: 4 %
Hematocrit: 43.8 % (ref 34.0–46.6)
Hemoglobin: 14.7 g/dL (ref 11.1–15.9)
Immature Grans (Abs): 0 10*3/uL (ref 0.0–0.1)
Immature Granulocytes: 0 %
Lymphocytes Absolute: 2 10*3/uL (ref 0.7–3.1)
Lymphs: 27 %
MCH: 29.8 pg (ref 26.6–33.0)
MCHC: 33.6 g/dL (ref 31.5–35.7)
MCV: 89 fL (ref 79–97)
Monocytes Absolute: 0.5 10*3/uL (ref 0.1–0.9)
Monocytes: 7 %
Neutrophils Absolute: 4.7 10*3/uL (ref 1.4–7.0)
Neutrophils: 61 %
Platelets: 338 10*3/uL (ref 150–450)
RBC: 4.93 x10E6/uL (ref 3.77–5.28)
RDW: 12.6 % (ref 11.7–15.4)
WBC: 7.5 10*3/uL (ref 3.4–10.8)

## 2021-11-08 LAB — COMPREHENSIVE METABOLIC PANEL
ALT: 24 IU/L (ref 0–32)
AST: 18 IU/L (ref 0–40)
Albumin/Globulin Ratio: 1.9 (ref 1.2–2.2)
Albumin: 5 g/dL — ABNORMAL HIGH (ref 3.8–4.9)
Alkaline Phosphatase: 116 IU/L (ref 44–121)
BUN/Creatinine Ratio: 26 — ABNORMAL HIGH (ref 9–23)
BUN: 22 mg/dL (ref 6–24)
Bilirubin Total: <0.2 mg/dL (ref 0.0–1.2)
CO2: 23 mmol/L (ref 20–29)
Calcium: 10.1 mg/dL (ref 8.7–10.2)
Chloride: 102 mmol/L (ref 96–106)
Creatinine, Ser: 0.85 mg/dL (ref 0.57–1.00)
Globulin, Total: 2.6 g/dL (ref 1.5–4.5)
Glucose: 78 mg/dL (ref 70–99)
Potassium: 5 mmol/L (ref 3.5–5.2)
Sodium: 141 mmol/L (ref 134–144)
Total Protein: 7.6 g/dL (ref 6.0–8.5)
eGFR: 81 mL/min/{1.73_m2} (ref >59)

## 2021-11-08 LAB — LIPID PANEL
Chol/HDL Ratio: 5 ratio — ABNORMAL HIGH (ref 0.0–4.4)
Cholesterol, Total: 221 mg/dL — ABNORMAL HIGH (ref 100–199)
HDL: 44 mg/dL (ref >39)
LDL Chol Calc (NIH): 154 mg/dL — ABNORMAL HIGH (ref 0–99)
Triglycerides: 125 mg/dL (ref 0–149)
VLDL Cholesterol Cal: 23 mg/dL (ref 5–40)

## 2021-11-08 LAB — TSH: TSH: 0.537 u[IU]/mL (ref 0.450–4.500)

## 2021-11-08 LAB — INSULIN, RANDOM: INSULIN: 19.9 u[IU]/mL (ref 2.6–24.9)

## 2021-11-08 LAB — HEMOGLOBIN A1C
Est. average glucose Bld gHb Est-mCnc: 117 mg/dL
Hgb A1c MFr Bld: 5.7 % — ABNORMAL HIGH (ref 4.8–5.6)

## 2021-11-08 LAB — VITAMIN B12: Vitamin B-12: 492 pg/mL (ref 232–1245)

## 2021-11-08 LAB — T4, FREE: Free T4: 1.27 ng/dL (ref 0.82–1.77)

## 2021-11-08 LAB — VITAMIN D 25 HYDROXY (VIT D DEFICIENCY, FRACTURES): Vit D, 25-Hydroxy: 24.2 ng/mL — ABNORMAL LOW (ref 30.0–100.0)

## 2021-11-08 LAB — T3: T3, Total: 155 ng/dL (ref 71–180)

## 2021-11-08 LAB — FOLATE: Folate: 3.8 ng/mL (ref >3.0)

## 2021-11-19 NOTE — Progress Notes (Signed)
?Office: [phone]  /  Fax: [phone] ? ? ? ?Date: 12/03/2021   ?Appointment Start Time: 3:04pm ?Duration: 62 minutes ?Provider: Glennie Isle, Psy.D. ?Type of Session: Intake for Individual Therapy  ?Location of Patient: Work (private location) ?Location of Provider: Provider's home (private office) ?Type of Contact: Telepsychological Visit via MyChart Video Visit ? ?Informed Consent: Prior to proceeding with today's appointment, two pieces of identifying information were obtained. In addition, Myna's physical location at the time of this appointment was obtained as well a phone number she could be reached at in the event of technical difficulties. Anne Lewis and this provider participated in today's telepsychological service.  ? ?The provider's role was explained to [employer]. The provider reviewed and discussed issues of confidentiality, privacy, and limits therein (e.g., reporting obligations). In addition to verbal informed consent, written informed consent for psychological services was obtained prior to the initial appointment. Since the clinic is not a 24/7 crisis center, mental health emergency resources were shared and this  provider explained MyChart, e-mail, voicemail, and/or other messaging systems should be utilized only for non-emergency reasons. This provider also explained that information obtained during appointments will be placed in [name]'s medical record and relevant information will be shared with other providers at [facility] for coordination of care. Lisha agreed information may be shared with other [facility] providers as needed for coordination of care and by signing the service agreement document, she provided written consent for coordination of care. Prior to initiating telepsychological services, Marquette completed an informed consent document, which included the development of a safety plan (i.e., an emergency contact and emergency  resources) in the event of an emergency/crisis. Nyilah verbally acknowledged understanding she is ultimately responsible for understanding her insurance benefits for telepsychological and in-person services. This provider also reviewed confidentiality, as it relates to telepsychological services, as well as the rationale for telepsychological services (i.e., to reduce exposure risk to COVID-19). Jaedin  acknowledged understanding that appointments cannot be recorded without both party consent and she is aware she is responsible for securing confidentiality on her end of the session. Jenipher verbally consented to proceed. ? ?Per [facility] new policy, Loretta will be e-mailed two additional forms (AOB and Receipt of NPP) to sign as well as [place]'s Notice of Privacy Practices. The content of the documents were explained to [place] at the onset of the appointment, and she agreed to proceed. Additionally, she agreed to complete the forms and return them prior to the next appointment with this provider.  ? ?Chief Complaint/HPI: Anne Lewis was referred by Dr. Dennard Nip due to  "history of eating disorder" . Per the note for the initial visit with Dr. 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 She was taught how to purge there and was encouraged to adopt anorexic behavior. Her mother contributed to her body dysmorphia " The note for the initial appointment further indicated the following: "Anne Lewis's habits were reviewed today and are as follows: Her family eats meals together, she thinks her family will eat healthier with her, her desired weight loss is 67 lbs, she started gaining weight in 2020, her heaviest weight ever was 213 pounds, she snacks frequently in the evenings, she skips meals frequently, she is frequently drinking liquids with calories, she frequently makes poor food choices, she frequently eats larger portions than normal, and she struggles with  emotional eating." Anne Lewis's Food and Mood (modified PHQ-9) score on 11/07/2021 was 17. ? ?  During today's appointment, [name] reported a history of her weight fluctuating over the years. 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 She disclosed a history of significant concerns secondary to disordered eating behaviors (e.g., convulsions, hair loss). Anne Lewis disclosed she was diagnosed with anorexia and bulimia "during younger years." Anne Lewis explained she attended support groups, which she described as helpful. Due to COVID-19 she lost her job and recalled gaining weight. She shared she wonders if her current weight gain is secondary to hormonal changes, adding she attempted to discuss the concern with her PCP and was prescribed different antidepressant medications. She agreed to discuss with her gynecologist on May 26th. Moreover, Anne Lewis stated she last used laxatives or purged approximately "one to two" years ago. She further reported she is no longer significantly restricting food intake; however,she "hate[s]" having to eat. She was verbally administered a questionnaire assessing various behaviors related to emotional eating behaviors. Anne Lewis endorsed the following: experience food cravings on a regular basis and eat certain foods when you are anxious, stressed, depressed, or your feelings are hurt. Currently, Anne Lewis reported challenges following her prescribed structured meal plan, adding it makes her "feel very guilty" to eat all the food. She explained she feels "embarrassed" when others see her eat due to worry about judgment. Anne Lewis further stated she intentionally drinks water to help her "get full faster."  ? ?Mental Status Examination:  ?Appearance: neat ?Behavior: appropriate to circumstances ?Mood: depressed ?Affect: mood congruent;  tearful at times ?Speech: WNL ?Eye Contact: appropriate ?Psychomotor Activity: WNL ?Gait: unable to assess  ?Thought Process: linear, logical, and goal directed and denies suicidal, homicidal, and self-harm ideation, plan and intent  ?Thought Content/Perception: no hallucinations, delusions, bizarre thinking or behavior endorsed or observed ?Orientation: AAOx4 ?Memory/Concentration: memory, attention, language, and fund of knowledge intact  ?Insight/Judgment: fair ? ?Family & Psychosocial History: Lataria reported she is married and she has three children (ages 51, 22, and 21). 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 Additionally, Gabrella shared her highest level of education obtained is a bachelor's degree. Currently, Bridgette's social support system consists of her son (age 92), sister, and boss. Moreover, Nadia stated she resides with her son (age 70) and husband.  ? ?Medical History:  ?Past Medical History:  ?Diagnosis Date  ? anemia   ? Anxiety   ? Aortic atherosclerosis ([place])   ? B12 deficiency   ? Back pain   ? BRCA negative 11/2013  ? Chest pain   ? Constipation   ? Fatty liver   ? Gallbladder problem   ? H pylori ulcer   ? Heartburn   ? Joint pain   ? Migraines   ? Palpitation   ? sleep apnea   ? Stomach ulcer   ? Ulcer of gastric fundus   ? Vitamin D deficiency   ? ?Past Surgical History:  ?Procedure Laterality Date  ? CESAREAN SECTION  [phone]  ? ENDOMETRIAL CRYOABLATION  12.19.2012  ? HER OPTION - office performed  ? LAPAROSCOPIC APPENDECTOMY N/A 09/03/2021  ? Procedure: APPENDECTOMY LAPAROSCOPIC;  Surgeon: Ralene Ok, MD;  Location: [facility];  Service: General;  Laterality: N/A;  ? TONSILLECTOMY  1987  ? ?Current Outpatient Medications on File Prior to Visit  ?Medication Sig Dispense Refill  ? diphenhydrAMINE (BENADRYL) 25 mg capsule Take 25 mg by mouth every 6 (six) hours as needed.    ?  hyoscyamine (LEVSIN SL) 0.125 MG SL tablet Place 1 tablet (0.125 mg  total) under the tongue every 6 (six) hours as needed for cramping. 30 tablet 6  ? naproxen sodium (ALEVE) 220 MG tablet Take 440 mg by mouth daily as needed.    ? ondansetron (ZOFRAN) 4 MG tablet Take 1 tablet

## 2021-11-21 ENCOUNTER — Encounter (INDEPENDENT_AMBULATORY_CARE_PROVIDER_SITE_OTHER): Payer: Self-pay | Admitting: Family Medicine

## 2021-11-21 ENCOUNTER — Ambulatory Visit (INDEPENDENT_AMBULATORY_CARE_PROVIDER_SITE_OTHER): Payer: 59 | Admitting: Family Medicine

## 2021-11-21 ENCOUNTER — Other Ambulatory Visit: Payer: Self-pay | Admitting: Gastroenterology

## 2021-11-21 VITALS — BP 106/72 | HR 81 | Temp 98.2°F | Ht 68.0 in | Wt 206.0 lb

## 2021-11-21 DIAGNOSIS — E559 Vitamin D deficiency, unspecified: Secondary | ICD-10-CM | POA: Diagnosis not present

## 2021-11-21 DIAGNOSIS — E669 Obesity, unspecified: Secondary | ICD-10-CM

## 2021-11-21 DIAGNOSIS — R7303 Prediabetes: Secondary | ICD-10-CM

## 2021-11-21 DIAGNOSIS — E7849 Other hyperlipidemia: Secondary | ICD-10-CM

## 2021-11-21 DIAGNOSIS — Z6831 Body mass index (BMI) 31.0-31.9, adult: Secondary | ICD-10-CM

## 2021-11-21 MED ORDER — VITAMIN D (ERGOCALCIFEROL) 1.25 MG (50000 UNIT) PO CAPS: 50000.0000 [IU] | ORAL_CAPSULE | ORAL | 0 refills | Status: DC

## 2021-11-22 MED ORDER — HYOSCYAMINE SULFATE 0.125 MG SL SUBL: 0.1250 mg | SUBLINGUAL_TABLET | Freq: Four times a day (QID) | SUBLINGUAL | 6 refills | Status: DC | PRN

## 2021-11-22 NOTE — Telephone Encounter (Signed)
?  Patient notified that hyoscyamine was sent to the pharmacy at her request. Patient also reminded that she should return stool studies to the lab as soon as possible.  Patient agreed to plan and verbalized understanding. ? ? ? ?Milus Banister, MD  You 3 hours ago (5:52 AM)  ? ?It is OK to refill for 6 months, can you contact her about the stool studies.  ? ?Thanks   ? ?You  Milus Banister, MD 16 hours ago (4:17 PM)  ? ?Is it OK to refill?    ? ?Looks like she has not completed her stool studies.  ? ?Thank you,  ?Leanne Chang, CMA   ? ?

## 2021-11-22 NOTE — Progress Notes (Signed)
? ? ? ? ?Chief Complaint:  ? ?OBESITY ?Anne Lewis (MR# [id number]) is a 56 y.o. female who presents for evaluation and treatment of obesity and related comorbidities. Current BMI is Body mass index is 31.47 kg/m?Anne Lewis has been struggling with her weight for many years and has been unsuccessful in either losing weight, maintaining weight loss, or reaching her healthy weight goal. ? ?Brihana is currently in the action stage of change and ready to dedicate time achieving and maintaining a healthier weight. [name] is interested in becoming our patient and working on intensive lifestyle modifications including (but not limited to) diet and exercise for weight loss. ? ?Anne Lewis's habits were reviewed today and are as follows: Her family eats meals together, she thinks her family will eat healthier with her, her desired weight loss is 67 lbs, she started gaining weight in 2020, her heaviest weight ever was 213 pounds, she snacks frequently in the evenings, she skips meals frequently, she is frequently drinking liquids with calories, she frequently makes poor food choices, she frequently eats larger portions than normal, and she struggles with emotional eating. ? ?Depression Screen ?Anne Lewis's Food and Mood (modified PHQ-9) score was 17. ? ? ?  11/07/2021  ?  9:32 AM  ?Depression screen PHQ 2/9  ?Decreased Interest 3  ?Down, Depressed, Hopeless 3  ?PHQ - 2 Score 6  ?Altered sleeping 3  ?Tired, decreased energy 3  ?Change in appetite 1  ?Feeling bad or failure about yourself  3  ?Trouble concentrating 0  ?Moving slowly or fidgety/restless 0  ?Suicidal thoughts 1  ?PHQ-9 Score 17  ?Difficult doing work/chores Not difficult at all  ? ?Subjective:  ? ?1. Other fatigue ?[name] admits to daytime somnolence and admits to waking up still tired. Patient has a history of symptoms of daytime fatigue, morning fatigue, and morning headache. Anne Lewis generally gets 4 hours of sleep per night, and states that she has  difficulty falling asleep. Snoring is present. Apneic episodes are present. Epworth Sleepiness Score is 1.  ? ?2. SOBOE (shortness of breath on exertion) ?[name] notes increasing shortness of breath with exercising and seems to be worsening over time with weight gain. She notes getting out of breath sooner with activity than she used to. This has not gotten worse recently. [name] denies shortness of breath at rest or orthopnea. ? ?3. Other hyperlipidemia ?[name] is not on a statin. She has a history of elevated LDL. ? ?4. Vitamin D deficiency ?Anne Lewis has a history of Vitamin D deficiency, and she notes fatigue.  ? ?5. B12 deficiency ?Anne Lewis has a history of Vitamin B12 deficiency, and she notes fatigue.  ? ?6. History of eating disorder ?Anne Lewis grew up in [place] and modeled as a young woman. She was taught how to purge there and was encouraged to adopt anorexic behavior. Her mother contributed to her body dysmorphia  ? ?7. Hyperglycemia ?Anne Lewis has a history of elevated A1c and glucose, but also hypoglycemia.  ? ?8. At risk for heart disease ?Anne Lewis is at higher than average risk for cardiovascular disease due to obesity. ? ?Assessment/Plan:  ? ?1. Other fatigue ?Anne Lewis does feel that her weight is causing her energy to be lower than it should be. Fatigue may be related to obesity, depression or many other causes. Labs will be ordered, and in the meanwhile, Anne Lewis will focus on self care including making healthy food choices, increasing physical activity and focusing on stress reduction. ? ?- EKG 12-Lead ?- Vitamin B12 ?- CBC with  Differential/Platelet ?- Comprehensive metabolic panel ?- Folate ?- Lipid panel ?- T3 ?- T4, free ?- TSH ? ?2. SOBOE (shortness of breath on exertion) ?Anne Lewis does feel that she gets out of breath more easily that she used to when she exercises. Anne Lewis's shortness of breath appears to be obesity related and exercise induced. She has agreed to work on weight  loss and gradually increase exercise to treat her exercise induced shortness of breath. Will continue to monitor closely. ? ?3. Other hyperlipidemia ?We will check labs today, and Anne Lewis agreed to start Category 2 plan and we will follow up at her next visit.  ? ?4. Vitamin D deficiency ?We will check labs today, and we will follow up at Anne Lewis's next visit. ? ?- VITAMIN D 25 Hydroxy (Vit-D Deficiency, Fractures) ? ?5. B12 deficiency ?We will check labs today, and we will follow up at Anne Lewis's next visit. ? ?6. History of eating disorder ?We will refer to Dr. Mallie Mussel, our Bariatric Psychologist for evaluation.  ? ?7. Hyperglycemia ?We will check labs today and results with be discussed with Anne Lewis in 2 weeks at her follow up visit. In the meanwhile Anne Lewis will start her Category 2 plan and will work on weight loss efforts. ? ?- Hemoglobin A1c ?- Insulin, random ? ?8. Depression screening ?Raylin had a positive depression screening. Depression is commonly associated with obesity and often results in emotional eating behaviors. We will monitor this closely and work on CBT to help improve the non-hunger eating patterns. Referral to Psychology may be required if no improvement is seen as she continues in our clinic. ? ?9. At risk for heart disease ?Anne Lewis was given approximately 30 minutes of coronary artery disease prevention counseling today. She is 56 y.o. female and has risk factors for heart disease including obesity. We discussed intensive lifestyle modifications today with an emphasis on specific weight loss instructions and strategies. ? ?Repetitive spaced learning was employed today to elicit superior memory formation and behavioral change.  ? ?10. Obesity, current BMI 31.6 ?Anne Lewis is currently in the action stage of change and her goal is to continue with weight loss efforts. I recommend Anne Lewis begin the structured treatment plan as follows: ? ?She has agreed to the Category 2 Plan + 100  calories. ? ?Exercise goals: No exercise has been prescribed for now, while we concentrate on nutritional changes.  ? ?Behavioral modification strategies: increasing lean protein intake. ? ?She was informed of the importance of frequent follow-up visits to maximize her success with intensive lifestyle modifications for her multiple health conditions. She was informed we would discuss her lab results at her next visit unless there is a critical issue that needs to be addressed sooner. Maureena agreed to keep her next visit at the agreed upon time to discuss these results. ? ?Objective:  ? ?Blood pressure 111/74, pulse 93, temperature 98.1 ?F (36.7 ?C), height '5\' 8"'$  (1.727 m), weight 207 lb (93.9 kg), last menstrual period 03/14/2018, SpO2 100 %. Body mass index is 31.47 kg/m?. ? ?EKG: Normal sinus rhythm, rate 86 BPM. ? ?Indirect Calorimeter completed today shows a VO2 of 257 and a REE of 1771.  Her calculated basal metabolic rate is 2500 thus her basal metabolic rate is better than expected. ? ?General: Cooperative, alert, well developed, in no acute distress. ?HEENT: Conjunctivae and lids unremarkable. ?Cardiovascular: Regular rhythm.  ?Lungs: Normal work of breathing. ?Neurologic: No focal deficits.  ? ?Lab Results  ?Component Value Date  ? CREATININE 0.85 11/07/2021  ? BUN  22 11/07/2021  ? NA 141 11/07/2021  ? K 5.0 11/07/2021  ? CL 102 11/07/2021  ? CO2 23 11/07/2021  ? ?Lab Results  ?Component Value Date  ? ALT 24 11/07/2021  ? AST 18 11/07/2021  ? ALKPHOS 116 11/07/2021  ? BILITOT <0.2 11/07/2021  ? ?Lab Results  ?Component Value Date  ? HGBA1C 5.7 (H) 11/07/2021  ? HGBA1C 5.9 06/18/2021  ? HGBA1C 5.9 08/30/2019  ? ?Lab Results  ?Component Value Date  ? INSULIN 19.9 11/07/2021  ? ?Lab Results  ?Component Value Date  ? TSH 0.537 11/07/2021  ? ?Lab Results  ?Component Value Date  ? CHOL 221 (H) 11/07/2021  ? HDL 44 11/07/2021  ? LDLCALC 154 (H) 11/07/2021  ? LDLDIRECT 156.0 06/18/2021  ? TRIG 125 11/07/2021  ?  CHOLHDL 5.0 (H) 11/07/2021  ? ?Lab Results  ?Component Value Date  ? WBC 7.5 11/07/2021  ? HGB 14.7 11/07/2021  ? HCT 43.8 11/07/2021  ? MCV 89 11/07/2021  ? PLT 338 11/07/2021  ? ?No results found for: IRON, TIBC,

## 2021-12-03 ENCOUNTER — Telehealth (INDEPENDENT_AMBULATORY_CARE_PROVIDER_SITE_OTHER): Payer: Self-pay | Admitting: Psychology

## 2021-12-03 ENCOUNTER — Telehealth (INDEPENDENT_AMBULATORY_CARE_PROVIDER_SITE_OTHER): Payer: 59 | Admitting: Psychology

## 2021-12-03 DIAGNOSIS — F509 Eating disorder, unspecified: Secondary | ICD-10-CM

## 2021-12-03 DIAGNOSIS — F4323 Adjustment disorder with mixed anxiety and depressed mood: Secondary | ICD-10-CM | POA: Diagnosis not present
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
## 2021-12-04 ENCOUNTER — Encounter: Payer: Self-pay | Admitting: Family Medicine

## 2021-12-04 ENCOUNTER — Ambulatory Visit (INDEPENDENT_AMBULATORY_CARE_PROVIDER_SITE_OTHER): Payer: 59 | Admitting: Family Medicine

## 2021-12-04 VITALS — BP 135/78 | HR 77 | Temp 97.2°F | Ht 68.0 in | Wt 213.8 lb

## 2021-12-04 DIAGNOSIS — R21 Rash and other nonspecific skin eruption: Secondary | ICD-10-CM | POA: Diagnosis not present

## 2021-12-04 MED ORDER — HYDROXYZINE PAMOATE 50 MG PO CAPS: 50.0000 mg | ORAL_CAPSULE | Freq: Three times a day (TID) | ORAL | 0 refills | Status: DC | PRN

## 2021-12-04 MED ORDER — PREDNISONE 20 MG PO TABS: 20.0000 mg | ORAL_TABLET | Freq: Every day | ORAL | 0 refills | Status: AC

## 2021-12-04 NOTE — Patient Instructions (Signed)
Please take prednisone and hydroxyzine as needed.  Hydroxyzine can make you sleepy so try this at night first before taking a day or operating machinery. ?

## 2021-12-04 NOTE — Progress Notes (Signed)
?TeleHealth Visit:  ?This visit was completed with telemedicine (audio/video) technology. ?[facility] has verbally consented to this TeleHealth visit. The patient is located at home, the provider is located at home. The participants in this visit include the listed provider and patient. The visit was conducted today via MyChart video. ? ?OBESITY ?Anne Lewis is here to discuss her progress with her obesity treatment plan along with follow-up of her obesity related diagnoses.  ? ?Today's visit was # 3 ?Starting weight: 207 lbs ?Starting date: 11/07/21 ?Weight at last in office visit: 206 lbs on 11/21/21 ?Total weight loss: 1 lbs at last in office visit on 11/21/21. ?Today's reported weight:  No weight reported. ? ?Nutrition Plan: keeping a food journal and adhering to recommended goals of 1200-1400 calories and 85 gms protein.  ?Hunger is well controlled. Cravings are well controlled.  ?Current exercise:  walks 15-20 min 5 x week. ? ?Interim History: Anne Lewis has been experiencing a terrible rash of unknown etiology on her arms and legs.  Seeing PCP for this and has been prescribed prednisone. ?This has made it very hard for her to focus on journaling.   ?She reports that she does not like sweets, bread, pasta.  She mostly likes to eat protein, fruits, vegetables. ?She is eating only once per day.  Yesterday she did not eat until night.  She notes that this is a long-term habit for her.  She is unsure why she cannot lose weight when she eats so little. ? ?Assessment/Plan:  ?1. Prediabetes ?Anne Lewis has a diagnosis of prediabetes based on her elevated HgA1c. ?She denies polyphagia. ?Medication(s): none ?Lab Results  ?Component Value Date  ? HGBA1C 5.7 (H) 11/07/2021  ? ?Lab Results  ?Component Value Date  ? INSULIN 19.9 11/07/2021  ? ? ?Plan: ?Continue to work on increasing frequency of meals. ? ?2. Rash ?She reports the rash on her arms and legs is worsening.  The rash is painful and pruritic.  Her PCP did a biopsy  but she does not have the results back.  She is on prednisone and hydroxyzine. ? ?Plan: ?Follow-up with PCP as directed. ? ?3. Obesity: Current BMI 31.33 ?Anne Lewis is currently in the action stage of change. As such, her goal is to continue with weight loss efforts.  ?She has agreed to keeping a food journal and adhering to recommended goals of 1200-1400 calories and 85 gms protein.  ? ?Until she is able to focus on journaling she will have 2 meals per day.  She likes yogurt so I told her she can have 2 Greek yogurts during the day and then have a meal with at least 6 ounces of protein for dinner. ?Encouraged her to set an alarm to remind her to eat during the day. ? ?She may use my fitness pal or [employer] to journal.  Advised that [employer] ?still has a free barcode scanner. ? ?Exercise goals: Continue current regimen. ? ?Behavioral modification strategies: increasing lean protein intake and no skipping meals. ? ?Anne Lewis has agreed to follow-up with our clinic in 2 weeks.  ? ?No orders of the defined types were placed in this encounter. ? ? ?Medications Discontinued During This Encounter  ?Medication Reason  ? sucralfate (CARAFATE) 1 g tablet Patient Preference  ?  ? ?No orders of the defined types were placed in this encounter. ?   ? ?Objective:  ? ?VITALS: Per patient if applicable, see vitals. ?GENERAL: Alert and in no acute distress. ?CARDIOPULMONARY: No increased WOB. Speaking in clear sentences.  ?  PSYCH: Pleasant and cooperative. Speech normal rate and rhythm. Affect is appropriate. Insight and judgement are appropriate. Attention is focused, linear, and appropriate.  ?NEURO: Oriented as arrived to appointment on time with no prompting.  ? ?Lab Results  ?Component Value Date  ? CREATININE 0.85 11/07/2021  ? BUN 22 11/07/2021  ? NA 141 11/07/2021  ? K 5.0 11/07/2021  ? CL 102 11/07/2021  ? CO2 23 11/07/2021  ? ?Lab Results  ?Component Value Date  ? ALT 24 11/07/2021  ? AST 18 11/07/2021  ? ALKPHOS 116  11/07/2021  ? BILITOT <0.2 11/07/2021  ? ?Lab Results  ?Component Value Date  ? HGBA1C 5.7 (H) 11/07/2021  ? HGBA1C 5.9 06/18/2021  ? HGBA1C 5.9 08/30/2019  ? ?Lab Results  ?Component Value Date  ? INSULIN 19.9 11/07/2021  ? ?Lab Results  ?Component Value Date  ? TSH 0.537 11/07/2021  ? ?Lab Results  ?Component Value Date  ? CHOL 221 (H) 11/07/2021  ? HDL 44 11/07/2021  ? LDLCALC 154 (H) 11/07/2021  ? LDLDIRECT 156.0 06/18/2021  ? TRIG 125 11/07/2021  ? CHOLHDL 5.0 (H) 11/07/2021  ? ?Lab Results  ?Component Value Date  ? WBC 7.5 11/07/2021  ? HGB 14.7 11/07/2021  ? HCT 43.8 11/07/2021  ? MCV 89 11/07/2021  ? PLT 338 11/07/2021  ? ?No results found for: IRON, TIBC, FERRITIN ?Lab Results  ?Component Value Date  ? VD25OH 24.2 (L) 11/07/2021  ? VD25OH 12.59 (L) 06/18/2021  ? VD25OH 11.20 (L) 08/30/2019  ? ? ?Attestation Statements:  ? ?Reviewed by clinician on day of visit: allergies, medications, problem list, medical history, surgical history, family history, social history, and previous encounter notes. ? ?Time spent on visit including pre-visit chart review and post-visit charting and care was 31 minutes.  ? ? ?

## 2021-12-04 NOTE — Progress Notes (Signed)
? ?Acute Office Visit ? ?Subjective:  ? ?  ?Patient ID: Anne Lewis, female    DOB: [birth year], 56 y.o.   MRN: [id number] ? ?Chief Complaint  ?Patient presents with  ? Acute Visit  ?  Pt c/o painful itchy rash on both arms and thighs.x2 weeks ?Taking benadryl.  ? ? ?Patient denies anyone also has having this.  Denies any exposure to any outside insects or other bugs that she is seen.  Has had a new mattress and has not seen any bedbugs.  Denies any issues with pets but does have animal exposure. ? ?Rash ?This is a new problem. The current episode started in the past 7 days. The problem has been waxing and waning since onset. The affected locations include the left hand, right hip, right upper leg and left upper leg. The rash is characterized by pain, redness and itchiness. It is unknown (has been monitoring diet and exposure to new detergents and soaps) if there was an exposure to a precipitant. Pertinent negatives include no anorexia, congestion, cough, diarrhea, eye pain, facial edema, fatigue, fever, joint pain, nail changes, rhinorrhea, shortness of breath, sore throat or vomiting. Past treatments include antihistamine. The treatment provided no relief. There is no history of allergies, asthma, eczema or varicella.  ? ? ?Review of Systems  ?Constitutional:  Negative for fatigue and fever.  ?HENT:  Negative for congestion, rhinorrhea and sore throat.   ?Eyes:  Negative for pain.  ?Respiratory:  Negative for cough and shortness of breath.   ?Gastrointestinal:  Negative for anorexia, diarrhea and vomiting.  ?Musculoskeletal:  Negative for joint pain.  ?Skin:  Positive for rash. Negative for nail changes.  ? ? ?   ?Objective:  ?  ?BP 135/78 (BP Location: Left Arm, Patient Position: Sitting, Cuff Size: Normal)   Pulse 77   Temp (!) 97.2 ?F (36.2 ?C) (Temporal)   Ht '5\' 8"'$  (1.727 m)   Wt 213 lb 12.8 oz (97 kg)   LMP 03/14/2018   SpO2 97%   BMI 32.51 kg/m?  ?Gen: NAD, resting comfortably ?Skin: On hands  dorsal side, multiple erythematous papules, no drainage, but do extend along the dorsal side of the elbow but are most heavily concentrated along the hands ?Neuro: grossly normal, moves all extremities ?Psych: Normal affect and thought content ? ?No results found for any visits on 12/04/21. ? ? ?   ?Assessment & Plan:  ?Rash ?Multiple papules, pruritic ?Distribution dorsal hands, concern for some type of exposure or irritant, however does not appear to be classic contact irritation, appears to be multiple insect bites such as possible bedbugs versus scabies ?Patient reports no other family or other ill contacts making the above 2 less likely ?Try symptomatic relief with prednisone and hydroxyzine ?If no improvement, consider return for biopsy versus referral to dermatology versus empiric treatment for scabies ?Advised patient to check for bedbugs ?Problem List Items Addressed This Visit   ?None ?Visit Diagnoses   ? ? Rash    -  Primary  ? Relevant Medications  ? predniSONE (DELTASONE) 20 MG tablet  ? hydrOXYzine (VISTARIL) 50 MG capsule  ? ?  ? ? ?Meds ordered this encounter  ?Medications  ? predniSONE (DELTASONE) 20 MG tablet  ?  Sig: Take 1 tablet (20 mg total) by mouth daily with breakfast for 5 days.  ?  Dispense:  5 tablet  ?  Refill:  0  ? hydrOXYzine (VISTARIL) 50 MG capsule  ?  Sig: Take 1 capsule (50 mg  total) by mouth 3 (three) times daily as needed.  ?  Dispense:  30 capsule  ?  Refill:  0  ? ? ?Return in about 1 week (around 12/11/2021), or if symptoms worsen or fail to improve. ? ?Bonnita Hollow, MD ? ? ?

## 2021-12-05 ENCOUNTER — Telehealth: Payer: Self-pay | Admitting: Family Medicine

## 2021-12-05 ENCOUNTER — Encounter (INDEPENDENT_AMBULATORY_CARE_PROVIDER_SITE_OTHER): Payer: Self-pay | Admitting: Family Medicine

## 2021-12-05 ENCOUNTER — Telehealth (INDEPENDENT_AMBULATORY_CARE_PROVIDER_SITE_OTHER): Payer: 59 | Admitting: Family Medicine

## 2021-12-05 DIAGNOSIS — E669 Obesity, unspecified: Secondary | ICD-10-CM | POA: Diagnosis not present

## 2021-12-05 DIAGNOSIS — R7303 Prediabetes: Secondary | ICD-10-CM

## 2021-12-05 DIAGNOSIS — Z6831 Body mass index (BMI) 31.0-31.9, adult: Secondary | ICD-10-CM | POA: Diagnosis not present

## 2021-12-05 DIAGNOSIS — R21 Rash and other nonspecific skin eruption: Secondary | ICD-10-CM | POA: Diagnosis not present

## 2021-12-05 NOTE — Telephone Encounter (Signed)
Message below FYI.

## 2021-12-05 NOTE — Progress Notes (Signed)
Chief Complaint:   OBESITY Anne Lewis is here to discuss her progress with her obesity treatment plan along with follow-up of her obesity related diagnoses. Anne Lewis is on the Category 2 Plan + 100 calories and states she is following her eating plan approximately 90% of the time. Anne Lewis states she is walking and tracking steps 7 times per week.   Today's visit was #: 2 Starting weight: 207 lbs Starting date: 11/07/2021 Today's weight: 206 lbs Today's date: 11/21/2021 Total lbs lost to date: 1 Total lbs lost since last in-office visit: 1  Interim History: Anne Lewis struggled to eat all of the food on her plan. She felt it was more food than normal. She did mis some milk in her coffee.   Subjective:   1. Other hyperlipidemia, pure Anne Lewis is not on statin, and she is working on her diet, exercise, and weight loss to improve. She denies chest pain. I discussed labs with the patient today.  2. Pre-diabetes Anne Lewis has a new diagnosis. Her A1c is elevated at 5.7. She is working on her diet and exercise. She notes decreased polyphagia. I discussed labs with the patient today.  3. Vitamin D deficiency Anne Lewis is on Vitamin D prescription weekly, but her level is very low. I discussed labs with the patient today.  Assessment/Plan:   1. Other hyperlipidemia, pure Anne Lewis will continue with her diet and exercise, and we will recheck labs in 3 months.  2. Pre-diabetes Anne Lewis will continue her diet, exercise, and weight loss. We may consider medications in the future.   3. Vitamin D deficiency Anne Lewis agreed to increase prescription Vitamin D to 50,000 IU every 3 days, with no refills.   - Vitamin D, Ergocalciferol, (DRISDOL) 1.25 MG (50000 UNIT) CAPS capsule; Take 1 capsule (50,000 Units total) by mouth every 3 (three) days.  Dispense: 10 capsule; Refill: 0  4. Obesity, Current weight 31.3 Anne Lewis is currently in the action stage of change. As such, her goal is to continue  with weight loss efforts. She has agreed to change to keeping a food journal and adhering to recommended goals of 1200-1400 calories and 80+ grams of protein daily.   Exercise goals: As is.   Behavioral modification strategies: increasing lean protein intake.  Anne Lewis has agreed to follow-up with our clinic in 2 weeks. She was informed of the importance of frequent follow-up visits to maximize her success with intensive lifestyle modifications for her multiple health conditions.   Objective:   Blood pressure 106/72, pulse 81, temperature 98.2 F (36.8 C), height '5\' 8"'$  (1.727 m), weight 206 lb (93.4 kg), last menstrual period 03/14/2018, SpO2 99 %. Body mass index is 31.32 kg/m.  General: Cooperative, alert, well developed, in no acute distress. HEENT: Conjunctivae and lids unremarkable. Cardiovascular: Regular rhythm.  Lungs: Normal work of breathing. Neurologic: No focal deficits.   Lab Results  Component Value Date   CREATININE 0.85 11/07/2021   BUN 22 11/07/2021   NA 141 11/07/2021   K 5.0 11/07/2021   CL 102 11/07/2021   CO2 23 11/07/2021   Lab Results  Component Value Date   ALT 24 11/07/2021   AST 18 11/07/2021   ALKPHOS 116 11/07/2021   BILITOT <0.2 11/07/2021   Lab Results  Component Value Date   HGBA1C 5.7 (H) 11/07/2021   HGBA1C 5.9 06/18/2021   HGBA1C 5.9 08/30/2019   Lab Results  Component Value Date   INSULIN 19.9 11/07/2021   Lab Results  Component Value Date  TSH 0.537 11/07/2021   Lab Results  Component Value Date   CHOL 221 (H) 11/07/2021   HDL 44 11/07/2021   LDLCALC 154 (H) 11/07/2021   LDLDIRECT 156.0 06/18/2021   TRIG 125 11/07/2021   CHOLHDL 5.0 (H) 11/07/2021   Lab Results  Component Value Date   VD25OH 24.2 (L) 11/07/2021   VD25OH 12.59 (L) 06/18/2021   VD25OH 11.20 (L) 08/30/2019   Lab Results  Component Value Date   WBC 7.5 11/07/2021   HGB 14.7 11/07/2021   HCT 43.8 11/07/2021   MCV 89 11/07/2021   PLT 338 11/07/2021    No results found for: IRON, TIBC, FERRITIN  Attestation Statements:   Reviewed by clinician on day of visit: allergies, medications, problem list, medical history, surgical history, family history, social history, and previous encounter notes. Time spent on visit including pre and post visit documentation is 51 minutes   I, Trixie Dredge, am acting as transcriptionist for Dennard Nip, MD.  I have reviewed the above documentation for accuracy and completeness, and I agree with the above. -  Dennard Nip, MD

## 2021-12-05 NOTE — Telephone Encounter (Signed)
FYI: Pt called about rash, says she thinks she has figured out what it is. In 2019 she was bit by a cat, and had the same reaction as she is having now... recently she was bit by her dog, while they were playing. Pt has FU appt next week.  ?

## 2021-12-06 ENCOUNTER — Telehealth: Payer: Self-pay | Admitting: Family Medicine

## 2021-12-06 NOTE — Telephone Encounter (Signed)
Dr Grandville Silos saw this pt for a rash and she would like him to know that she thinks the rash came from a dog bite. She has an appt next week but insists on a phone call about the rash.

## 2021-12-07 NOTE — Telephone Encounter (Signed)
LVM for patient to return call. 

## 2021-12-07 NOTE — Telephone Encounter (Signed)
Called and spoke to patient regarding informed patient of provider instructions and comments. Pt verbalized understanding. As, cma

## 2021-12-11 ENCOUNTER — Ambulatory Visit (INDEPENDENT_AMBULATORY_CARE_PROVIDER_SITE_OTHER): Payer: 59 | Admitting: Family Medicine

## 2021-12-11 ENCOUNTER — Encounter: Payer: Self-pay | Admitting: Family Medicine

## 2021-12-11 VITALS — BP 130/78 | HR 71 | Temp 98.3°F | Ht 68.0 in | Wt 209.8 lb

## 2021-12-11 DIAGNOSIS — R7303 Prediabetes: Secondary | ICD-10-CM | POA: Diagnosis not present

## 2021-12-11 DIAGNOSIS — R58 Hemorrhage, not elsewhere classified: Secondary | ICD-10-CM | POA: Insufficient documentation

## 2021-12-11 DIAGNOSIS — R21 Rash and other nonspecific skin eruption: Secondary | ICD-10-CM | POA: Insufficient documentation

## 2021-12-11 LAB — CBC WITH DIFFERENTIAL/PLATELET
Basophils Absolute: 0 10*3/uL (ref 0.0–0.1)
Basophils Relative: 0.7 % (ref 0.0–3.0)
Eosinophils Absolute: 0.3 10*3/uL (ref 0.0–0.7)
Eosinophils Relative: 5.3 % — ABNORMAL HIGH (ref 0.0–5.0)
HCT: 43.1 % (ref 36.0–46.0)
Hemoglobin: 14.5 g/dL (ref 12.0–15.0)
Lymphocytes Relative: 25.8 % (ref 12.0–46.0)
Lymphs Abs: 1.5 10*3/uL (ref 0.7–4.0)
MCHC: 33.6 g/dL (ref 30.0–36.0)
MCV: 88.5 fl (ref 78.0–100.0)
Monocytes Absolute: 0.5 10*3/uL (ref 0.1–1.0)
Monocytes Relative: 7.9 % (ref 3.0–12.0)
Neutro Abs: 3.5 10*3/uL (ref 1.4–7.7)
Neutrophils Relative %: 60.3 % (ref 43.0–77.0)
Platelets: 344 10*3/uL (ref 150.0–400.0)
RBC: 4.87 Mil/uL (ref 3.87–5.11)
RDW: 13.9 % (ref 11.5–15.5)
WBC: 5.9 10*3/uL (ref 4.0–10.5)

## 2021-12-11 MED ORDER — METFORMIN HCL ER 500 MG PO TB24: 500.0000 mg | ORAL_TABLET | Freq: Every day | ORAL | 0 refills | Status: DC

## 2021-12-11 MED ORDER — DOXYCYCLINE HYCLATE 100 MG PO TABS: 100.0000 mg | ORAL_TABLET | Freq: Two times a day (BID) | ORAL | 0 refills | Status: AC

## 2021-12-11 MED ORDER — DOXYCYCLINE HYCLATE 100 MG PO TABS: 100.0000 mg | ORAL_TABLET | Freq: Two times a day (BID) | ORAL | 0 refills | Status: DC

## 2021-12-11 NOTE — Progress Notes (Signed)
Established Patient Office Visit  Subjective   Patient ID: Anne Lewis, female    DOB: August 24, 1965  Age: 56 y.o. MRN: [id number]  Chief Complaint  Patient presents with   Follow-up    1 week follow up per patient still have painful rash on arms and legs some headaches. Patient have noticed some bruising she also states she remembered she had these same symptoms after she was bitten by a stray cat a few years ago and she was nipped by her dog prior the rash.     HPI follow-up of body 70-day history of a fixed rash on her hands and inner thighs.  There are no bruises on her legs that are unaccounted for.  She has been experiencing headaches.  There has been no fever.  The rash is nonpruritic and painful.  She is a prediabetic through primary care labs drawn at her appointment with weight loss management.  Interdigital spaces private areas or significant other    Review of Systems  Constitutional:  Negative for chills, diaphoresis, malaise/fatigue and weight loss.  HENT: Negative.    Eyes: Negative.  Negative for blurred vision and double vision.  Cardiovascular:  Negative for chest pain.  Gastrointestinal:  Negative for abdominal pain.  Genitourinary: Negative.   Musculoskeletal:  Negative for falls and myalgias.  Neurological:  Positive for headaches. Negative for speech change, loss of consciousness and weakness.  Psychiatric/Behavioral: Negative.       Objective:     BP 130/78 (BP Location: Right Arm, Patient Position: Sitting, Cuff Size: Normal)   Pulse 71   Temp 98.3 F (36.8 C) (Temporal)   Ht '5\' 8"'$  (1.727 m)   Wt 209 lb 12.8 oz (95.2 kg)   LMP 03/14/2018   SpO2 98%   BMI 31.90 kg/m    Physical Exam Constitutional:      General: She is not in acute distress.    Appearance: Normal appearance. She is not ill-appearing, toxic-appearing or diaphoretic.  HENT:     Head: Normocephalic and atraumatic.     Right Ear: External ear normal.     Left Ear: External ear  normal.     Mouth/Throat:     Mouth: Mucous membranes are moist.     Pharynx: Oropharynx is clear. No oropharyngeal exudate or posterior oropharyngeal erythema.  Eyes:     General: No scleral icterus.       Right eye: No discharge.        Left eye: No discharge.     Extraocular Movements: Extraocular movements intact.     Conjunctiva/sclera: Conjunctivae normal.     Pupils: Pupils are equal, round, and reactive to light.  Cardiovascular:     Rate and Rhythm: Normal rate and regular rhythm.  Pulmonary:     Effort: Pulmonary effort is normal. No respiratory distress.     Breath sounds: Normal breath sounds.  Musculoskeletal:     Cervical back: No rigidity or tenderness.  Skin:    General: Skin is warm and dry.       Neurological:     Mental Status: She is alert and oriented to person, place, and time.  Psychiatric:        Mood and Affect: Mood normal.        Behavior: Behavior normal.     No results found for any visits on 12/11/21.    The 10-year ASCVD risk score (Arnett DK, et al., 2019) is: 7.1%    Assessment & Plan:   Problem  List Items Addressed This Visit       Musculoskeletal and Integument   Rash   Relevant Medications   doxycycline (VIBRA-TABS) 100 MG tablet   Other Relevant Orders   Rocky mtn spotted fvr abs pnl(IgG+IgM)   Ambulatory referral to Dermatology     Other   Ecchymosis   Relevant Orders   CBC w/Diff   Lactate dehydrogenase   Pre-diabetes - Primary   Relevant Medications   metFORMIN (GLUCOPHAGE-XR) 500 MG 24 hr tablet    Return in about 3 months (around 03/13/2022), or if symptoms worsen or fail to improve.  We will start metformin XR to control the progression of diabetes along with weight loss.  Doxycycline for 10 days sun precautions given.  This rash does not have a clear syndrome associated with it.  Derm referral.   Libby Maw, MD

## 2021-12-12 LAB — LACTATE DEHYDROGENASE: LDH: 153 U/L (ref 120–250)

## 2021-12-14 ENCOUNTER — Encounter: Payer: 59 | Admitting: Obstetrics & Gynecology

## 2021-12-14 LAB — ROCKY MTN SPOTTED FVR ABS PNL(IGG+IGM)
RMSF IgG: NOT DETECTED
RMSF IgM: NOT DETECTED
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
## 2021-12-19 ENCOUNTER — Ambulatory Visit (INDEPENDENT_AMBULATORY_CARE_PROVIDER_SITE_OTHER): Payer: 59 | Admitting: Family Medicine

## 2021-12-19 ENCOUNTER — Encounter (INDEPENDENT_AMBULATORY_CARE_PROVIDER_SITE_OTHER): Payer: Self-pay | Admitting: Family Medicine

## 2021-12-19 VITALS — BP 107/75 | HR 100 | Temp 98.5°F | Ht 68.0 in | Wt 203.0 lb

## 2021-12-19 DIAGNOSIS — Z6831 Body mass index (BMI) 31.0-31.9, adult: Secondary | ICD-10-CM

## 2021-12-19 DIAGNOSIS — Z7984 Long term (current) use of oral hypoglycemic drugs: Secondary | ICD-10-CM

## 2021-12-19 DIAGNOSIS — R110 Nausea: Secondary | ICD-10-CM

## 2021-12-19 DIAGNOSIS — E559 Vitamin D deficiency, unspecified: Secondary | ICD-10-CM | POA: Diagnosis not present

## 2021-12-19 DIAGNOSIS — R7303 Prediabetes: Secondary | ICD-10-CM

## 2021-12-19 DIAGNOSIS — E669 Obesity, unspecified: Secondary | ICD-10-CM | POA: Diagnosis not present

## 2021-12-25 NOTE — Progress Notes (Signed)
Chief Complaint:   OBESITY Anne Lewis is here to discuss her progress with her obesity treatment plan along with follow-up of her obesity related diagnoses. Anne Lewis is on keeping a food journal and adhering to recommended goals of 1200-1400 calories and 85 grams of protein daily and states she is following her eating plan approximately 20% of the time. Anne Lewis states she is walking at work 7 times per week.    Today's visit was #: 4 Starting weight: 207 lbs Starting date: 11/07/2021 Today's weight: 203 lbs Today's date: 12/19/2021 Total lbs lost to date: 4 Total lbs lost since last in-office visit: 3  Interim History: Anne Lewis continues to do well with weight loss. She is dealing with increases stress with other health issues, and she is not able to concentrate on her nutrition as much as she would like.   Subjective:   1. Pre-diabetes Anne Lewis continues to work diet and weight loss. She is doing much better with decreasing simple carbohydrates. She was on prednisone recently, which can increase glucose. She was started on metformin by her PCP but she has problems with nausea and she doesn't want to take it.   2. Nausea Anne Lewis has chronic nausea and she is on Zofran with limited improvement.   3. Vitamin D deficiency Anne Lewis's Vitamin D level is below goal. She notes fatigue, but she has no problems with Vitamin D.   Assessment/Plan:   1. Pre-diabetes Kami was advised that 80% of the treatment of pre-diabetes is lifestyle modifications. She will continue as is, and we will recheck labs in 2 months to monitor her progress.   2. Nausea Anne Lewis was advised that Zofran can contribute to weight gain (mild effect). She will hold Zofran and follow up with GI for further evaluation.   3. Vitamin D deficiency Anne Lewis will continue Vitamin D as is, and we will recheck labs in 2 months.   4. Obesity, Current BMI 31.0 Anne Lewis is currently in the action stage of change. As  such, her goal is to continue with weight loss efforts. She has agreed to keeping a food journal and adhering to recommended goals of 1200-1400 calories and 80+ grams of protein daily.   Exercise goals: As is.   Behavioral modification strategies: increasing lean protein intake, decreasing simple carbohydrates, and increasing vegetables.  Anne Lewis has agreed to follow-up with our clinic in 4 weeks. She was informed of the importance of frequent follow-up visits to maximize her success with intensive lifestyle modifications for her multiple health conditions.   Objective:   Blood pressure 107/75, pulse 100, temperature 98.5 F (36.9 C), height '5\' 8"'$  (1.727 m), weight 203 lb (92.1 kg), last menstrual period 03/14/2018, SpO2 100 %. Body mass index is 30.87 kg/m.  General: Cooperative, alert, well developed, in no acute distress. HEENT: Conjunctivae and lids unremarkable. Cardiovascular: Regular rhythm.  Lungs: Normal work of breathing. Neurologic: No focal deficits.   Lab Results  Component Value Date   CREATININE 0.85 11/07/2021   BUN 22 11/07/2021   NA 141 11/07/2021   K 5.0 11/07/2021   CL 102 11/07/2021   CO2 23 11/07/2021   Lab Results  Component Value Date   ALT 24 11/07/2021   AST 18 11/07/2021   ALKPHOS 116 11/07/2021   BILITOT <0.2 11/07/2021   Lab Results  Component Value Date   HGBA1C 5.7 (H) 11/07/2021   HGBA1C 5.9 06/18/2021   HGBA1C 5.9 08/30/2019   Lab Results  Component Value Date   INSULIN 19.9  11/07/2021   Lab Results  Component Value Date   TSH 0.537 11/07/2021   Lab Results  Component Value Date   CHOL 221 (H) 11/07/2021   HDL 44 11/07/2021   LDLCALC 154 (H) 11/07/2021   LDLDIRECT 156.0 06/18/2021   TRIG 125 11/07/2021   CHOLHDL 5.0 (H) 11/07/2021   Lab Results  Component Value Date   VD25OH 24.2 (L) 11/07/2021   VD25OH 12.59 (L) 06/18/2021   VD25OH 11.20 (L) 08/30/2019   Lab Results  Component Value Date   WBC 5.9 12/11/2021   HGB  14.5 12/11/2021   HCT 43.1 12/11/2021   MCV 88.5 12/11/2021   PLT 344.0 12/11/2021   No results found for: IRON, TIBC, FERRITIN  Attestation Statements:   Reviewed by clinician on day of visit: allergies, medications, problem list, medical history, surgical history, family history, social history, and previous encounter notes.  Time spent on visit including pre-visit chart review and post-visit care and charting was 32 minutes.   I, Trixie Dredge, am acting as transcriptionist for Dennard Nip, MD.  I have reviewed the above documentation for accuracy and completeness, and I agree with the above. -  Dennard Nip, MD

## 2021-12-31 ENCOUNTER — Telehealth (INDEPENDENT_AMBULATORY_CARE_PROVIDER_SITE_OTHER): Payer: 59 | Admitting: Psychology

## 2021-12-31 DIAGNOSIS — F4323 Adjustment disorder with mixed anxiety and depressed mood: Secondary | ICD-10-CM | POA: Diagnosis not present

## 2021-12-31 DIAGNOSIS — F509 Eating disorder, unspecified: Secondary | ICD-10-CM | POA: Diagnosis not present

## 2022-01-02 ENCOUNTER — Encounter (INDEPENDENT_AMBULATORY_CARE_PROVIDER_SITE_OTHER): Payer: Self-pay | Admitting: Family Medicine

## 2022-01-02 ENCOUNTER — Telehealth: Payer: Self-pay | Admitting: Family Medicine

## 2022-01-02 ENCOUNTER — Telehealth (INDEPENDENT_AMBULATORY_CARE_PROVIDER_SITE_OTHER): Payer: 59 | Admitting: Family Medicine

## 2022-01-02 DIAGNOSIS — R112 Nausea with vomiting, unspecified: Secondary | ICD-10-CM

## 2022-01-02 DIAGNOSIS — R7303 Prediabetes: Secondary | ICD-10-CM

## 2022-01-02 DIAGNOSIS — F3289 Other specified depressive episodes: Secondary | ICD-10-CM | POA: Diagnosis not present

## 2022-01-02 DIAGNOSIS — Z7984 Long term (current) use of oral hypoglycemic drugs: Secondary | ICD-10-CM

## 2022-01-02 DIAGNOSIS — E669 Obesity, unspecified: Secondary | ICD-10-CM | POA: Diagnosis not present

## 2022-01-02 DIAGNOSIS — Z6831 Body mass index (BMI) 31.0-31.9, adult: Secondary | ICD-10-CM

## 2022-01-02 MED ORDER — BUPROPION HCL ER (SR) 150 MG PO TB12: 150.0000 mg | ORAL_TABLET | Freq: Every morning | ORAL | 0 refills | Status: DC

## 2022-01-02 MED ORDER — ONDANSETRON HCL 4 MG PO TABS: 4.0000 mg | ORAL_TABLET | Freq: Three times a day (TID) | ORAL | 0 refills | Status: DC | PRN

## 2022-01-02 NOTE — Telephone Encounter (Signed)
Pt's referral [id number] called her and let her know, they are no longer taking new pt's. She is wanting her referral sent to another office.
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
## 2022-01-07 ENCOUNTER — Ambulatory Visit: Payer: 59 | Admitting: Dermatology

## 2022-01-07 NOTE — Progress Notes (Unsigned)
  Office: [phone]  /  Fax: [phone]    Date: 01/21/2022   Appointment Start Time: *** Duration: *** minutes Provider: Glennie Isle, Psy.D. Type of Session: Individual Therapy  Location of Patient: {gbptloc:23249} (private location) Location of Provider: Provider's Home (private office) Type of Contact: Telepsychological Visit via MyChart Video Visit  Session Content: Anne Lewis is a 56 y.o. female presenting for a follow-up appointment to address the previously established treatment goal of increasing coping skills.Today's appointment was a telepsychological visit. Anne Lewis provided verbal consent for today's telepsychological appointment and she is aware she is responsible for securing confidentiality on her end of the session. Prior to proceeding with today's appointment, Anne Lewis's physical location at the time of this appointment was obtained as well a phone number she could be reached at in the event of technical difficulties. Anne Lewis and this provider participated in today's telepsychological service.   This provider conducted a brief check-in. *** Anne Lewis was receptive to today's appointment as evidenced by openness to sharing, responsiveness to feedback, and {gbreceptiveness:23401}.  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 Anne Lewis has demonstrated progress in her goal as evidenced by {gbtxprogress:22839}. Anne Lewis also {gbtxprogress2:22951}.  Plan: The next appointment is scheduled for *** at ***, which will be via MyChart Video Visit. The next session will focus on {Plan for Next Appointment:23400}.

## 2022-01-15 ENCOUNTER — Encounter (INDEPENDENT_AMBULATORY_CARE_PROVIDER_SITE_OTHER): Payer: Self-pay | Admitting: *Deleted

## 2022-01-16 ENCOUNTER — Ambulatory Visit (INDEPENDENT_AMBULATORY_CARE_PROVIDER_SITE_OTHER): Payer: 59 | Admitting: Family Medicine

## 2022-01-21 ENCOUNTER — Encounter (HOSPITAL_BASED_OUTPATIENT_CLINIC_OR_DEPARTMENT_OTHER): Payer: Self-pay | Admitting: Pediatrics

## 2022-01-21 ENCOUNTER — Emergency Department (HOSPITAL_BASED_OUTPATIENT_CLINIC_OR_DEPARTMENT_OTHER): Payer: 59

## 2022-01-21 ENCOUNTER — Encounter (INDEPENDENT_AMBULATORY_CARE_PROVIDER_SITE_OTHER): Payer: Self-pay

## 2022-01-21 ENCOUNTER — Emergency Department (HOSPITAL_BASED_OUTPATIENT_CLINIC_OR_DEPARTMENT_OTHER)
Admission: EM | Admit: 2022-01-21 | Discharge: 2022-01-22 | Disposition: A | Discharge status: Home / Self Care | Payer: 59 | Attending: Emergency Medicine | Admitting: Emergency Medicine

## 2022-01-21 ENCOUNTER — Other Ambulatory Visit: Payer: Self-pay

## 2022-01-21 ENCOUNTER — Telehealth (INDEPENDENT_AMBULATORY_CARE_PROVIDER_SITE_OTHER): Payer: 59 | Admitting: Psychology

## 2022-01-21 DIAGNOSIS — R519 Headache, unspecified: Secondary | ICD-10-CM | POA: Insufficient documentation

## 2022-01-21 DIAGNOSIS — L03211 Cellulitis of face: Secondary | ICD-10-CM | POA: Diagnosis not present

## 2022-01-21 DIAGNOSIS — R202 Paresthesia of skin: Secondary | ICD-10-CM | POA: Diagnosis not present

## 2022-01-21 DIAGNOSIS — F17210 Nicotine dependence, cigarettes, uncomplicated: Secondary | ICD-10-CM | POA: Diagnosis not present

## 2022-01-21 DIAGNOSIS — R220 Localized swelling, mass and lump, head: Secondary | ICD-10-CM | POA: Diagnosis present

## 2022-01-21 LAB — BASIC METABOLIC PANEL
Anion gap: 11 (ref 5–15)
BUN: 17 mg/dL (ref 6–20)
CO2: 21 mmol/L — ABNORMAL LOW (ref 22–32)
Calcium: 9.4 mg/dL (ref 8.9–10.3)
Chloride: 105 mmol/L (ref 98–111)
Creatinine, Ser: 1.1 mg/dL — ABNORMAL HIGH (ref 0.44–1.00)
GFR, Estimated: 59 mL/min — ABNORMAL LOW (ref >60)
Glucose, Bld: 165 mg/dL — ABNORMAL HIGH (ref 70–99)
Potassium: 3.7 mmol/L (ref 3.5–5.1)
Sodium: 137 mmol/L (ref 135–145)

## 2022-01-21 LAB — CBC
HCT: 43 % (ref 36.0–46.0)
Hemoglobin: 14.7 g/dL (ref 12.0–15.0)
MCH: 29.7 pg (ref 26.0–34.0)
MCHC: 34.2 g/dL (ref 30.0–36.0)
MCV: 86.9 fL (ref 80.0–100.0)
Platelets: 369 K/uL (ref 150–400)
RBC: 4.95 MIL/uL (ref 3.87–5.11)
RDW: 13.1 % (ref 11.5–15.5)
WBC: 10.4 K/uL (ref 4.0–10.5)
nRBC: 0 % (ref 0.0–0.2)

## 2022-01-21 MED ORDER — ONDANSETRON HCL 4 MG/2ML IJ SOLN
4.0000 mg | Freq: Once | INTRAMUSCULAR | Status: AC
Start: 2022-01-21 — End: 2022-01-21
  Administered 2022-01-21: 4 mg via INTRAVENOUS
  Filled 2022-01-21: qty 2

## 2022-01-21 MED ORDER — IOHEXOL 300 MG/ML  SOLN
75.0000 mL | Freq: Once | INTRAMUSCULAR | Status: AC | PRN
  Administered 2022-01-21: 75 mL via INTRAVENOUS

## 2022-01-21 MED ORDER — FENTANYL CITRATE PF 50 MCG/ML IJ SOSY
100.0000 ug | PREFILLED_SYRINGE | Freq: Once | INTRAMUSCULAR | Status: AC
  Administered 2022-01-21: 100 ug via INTRAVENOUS
  Filled 2022-01-21: qty 2

## 2022-01-21 NOTE — ED Provider Notes (Signed)
[name] Provider Note: Anne Spurling, MD, FACEP  CSN: [id number] MRN: [id number] ARRIVAL: 01/21/22 at 2019 ROOM: [place]  Facial Swelling   HISTORY OF PRESENT ILLNESS  01/21/22 11:06 PM Anne Lewis is a 56 y.o. female who has had increased sinus drainage for the past several days but no pain.  This morning she awakened with swelling on the right side of her face.  There is also decreased sensation of the right side of her face.  She is having pain pain on the right side of her head which is different than her usual migraines.  She rates her pain as a 9 out of 10.  She is not having a fever with this.  She is having no facial droop.  She is having no neurologic changes apart from the decreased sensation of her right face.  There is no associated redness.    Past Medical History:  Diagnosis Date   anemia    Anxiety    Aortic atherosclerosis (HCC)    B12 deficiency    Back pain    BRCA negative 11/2013   Chest pain    Constipation    Fatty liver    Gallbladder problem    H pylori ulcer    Heartburn    Joint pain    Migraines    Palpitation    sleep apnea    Stomach ulcer    Ulcer of gastric fundus    Vitamin D deficiency     Past Surgical History:  Procedure Laterality Date   CESAREAN SECTION  7829,5621   ENDOMETRIAL CRYOABLATION  12.19.2012   HER OPTION - office performed   LAPAROSCOPIC APPENDECTOMY N/A 09/03/2021   Procedure: APPENDECTOMY LAPAROSCOPIC;  Surgeon: Ralene Ok, MD;  Location: [facility];  Service: General;  Laterality: N/A;   TONSILLECTOMY  1987    Family History  Problem Relation Age of Onset   Hypertension Mother    Cancer Mother 36       intestational and ovarian   Diverticulitis Mother    Depression Mother    Anxiety disorder Mother    Sleep apnea Mother    Stroke Father    Diabetes Father    Hypertension Father    Heart disease Father    Cancer Sister 39       ovarian and intestional   Cancer  Brother 24       GI cancer   Cancer Maternal Grandmother 79       ovarian and pancreatic   Cancer Maternal Grandfather 82       pancreatic   Breast cancer Maternal Aunt 60   Cancer Paternal Aunt 73       stomach   Cancer Cousin 39       ovarian   Cancer Cousin 47       breast    Social History   Tobacco Use   Smoking status: Some Days    Types: Cigarettes   Smokeless tobacco: Never  Vaping Use   Vaping Use: Never used  Substance Use Topics   Alcohol use: Yes    Comment: occ   Drug use: No    Prior to Admission medications   Medication Sig Start Date End Date Taking? Authorizing Provider  cefUROXime (CEFTIN) 500 MG tablet Take 1 tablet (500 mg total) by mouth 2 (two) times daily with a meal. 01/22/22  Yes Brooklynn Brandenburg, MD  fluconazole (DIFLUCAN) 150 MG tablet Take 1 tablet as  needed for vaginal yeast infection.  May repeat in 3 days if symptoms persist. 01/22/22  Yes Georga Stys, Jenny Reichmann, MD  buPROPion [facility] SR) 150 MG 12 hr tablet Take 1 tablet (150 mg total) by mouth in the morning. 01/02/22   Whitmire, Joneen Boers, FNP  hydrOXYzine (VISTARIL) 50 MG capsule Take 1 capsule (50 mg total) by mouth 3 (three) times daily as needed. 12/04/21   Bonnita Hollow, MD  hyoscyamine (LEVSIN SL) 0.125 MG SL tablet Place 1 tablet (0.125 mg total) under the tongue every 6 (six) hours as needed for cramping. 11/22/21   Milus Banister, MD  metFORMIN (GLUCOPHAGE-XR) 500 MG 24 hr tablet Take 1 tablet (500 mg total) by mouth daily with breakfast. 12/11/21   Libby Maw, MD  naproxen sodium (ALEVE) 220 MG tablet Take 440 mg by mouth daily as needed.    [provider]  ondansetron (ZOFRAN) 4 MG tablet Take 1 tablet (4 mg total) by mouth every 8 (eight) hours as needed for nausea or vomiting. 01/02/22 01/02/23  Whitmire, Joneen Boers, FNP  Vitamin D, Ergocalciferol, (DRISDOL) 1.25 MG (50000 UNIT) CAPS capsule Take 1 capsule (50,000 Units total) by mouth every 3 (three) days. 11/21/21   Dennard Nip  D, MD  ZOLMitriptan (ZOMIG) 2.5 MG tablet Take 1 tablet (2.5 mg total) by mouth once for 1 dose. May repeat in 2 hours if headache persists or recurs. 10/11/21 11/07/97  Libby Maw, MD    Allergies Aspirin, Augmentin [amoxicillin-pot clavulanate], and Oxycodone   REVIEW OF SYSTEMS  Negative except as noted here or in the History of Present Illness.   PHYSICAL EXAMINATION  Initial Vital Signs Blood pressure 134/78, pulse 81, temperature 98.6 F (37 C), temperature source Oral, resp. rate 18, height 5' 8.5" (1.74 m), weight 93.1 kg, last menstrual period 03/14/2018, SpO2 96 %.  Examination General: Well-developed, well-nourished female in no acute distress; appearance consistent with age of record HENT: normocephalic; atraumatic; tenderness of right scalp; mild edema of right side of face with mild warmth but no erythema; TMs normal Eyes: pupils equal, round and reactive to light; extraocular muscles intact Neck: supple Heart: regular rate and rhythm; no ectopy Lungs: clear to auscultation bilaterally Abdomen: soft; nondistended; nontender; bowel sounds present Extremities: No deformity; full range of motion; pulses normal Neurologic: Awake, alert and oriented; motor function intact in all extremities and symmetric; no facial droop; decreased sensation of right side of face Skin: Warm and dry Psychiatric: Normal mood and affect   RESULTS  Summary of this visit's results, reviewed and interpreted by myself:   EKG Interpretation  Date/Time:    Ventricular Rate:    PR Interval:    QRS Duration:   QT Interval:    QTC Calculation:   R Axis:     Text Interpretation:         Laboratory Studies: Results for orders placed or performed during the hospital encounter of 01/21/22 (from the past 24 hour(s))  Basic metabolic panel     Status: Abnormal   Collection Time: 01/21/22  8:31 PM  Result Value Ref Range   Sodium 137 135 - 145 mmol/L   Potassium 3.7 3.5 - 5.1  mmol/L   Chloride 105 98 - 111 mmol/L   CO2 21 (L) 22 - 32 mmol/L   Glucose, Bld 165 (H) 70 - 99 mg/dL   BUN 17 6 - 20 mg/dL   Creatinine, Ser 1.10 (H) 0.44 - 1.00 mg/dL   Calcium 9.4 8.9 -  10.3 mg/dL   GFR, Estimated 59 (L) >60 mL/min   Anion gap 11 5 - 15  CBC     Status: None   Collection Time: 01/21/22  8:31 PM  Result Value Ref Range   WBC 10.4 4.0 - 10.5 K/uL   RBC 4.95 3.87 - 5.11 MIL/uL   Hemoglobin 14.7 12.0 - 15.0 g/dL   HCT 43.0 36.0 - 46.0 %   MCV 86.9 80.0 - 100.0 fL   MCH 29.7 26.0 - 34.0 pg   MCHC 34.2 30.0 - 36.0 g/dL   RDW 13.1 11.5 - 15.5 %   Platelets 369 150 - 400 K/uL   nRBC 0.0 0.0 - 0.2 %   Imaging Studies: CT Maxillofacial W Contrast  Result Date: 01/22/2022 CLINICAL DATA:  Right facial swelling EXAM: CT MAXILLOFACIAL WITH CONTRAST TECHNIQUE: Multidetector CT imaging of the maxillofacial structures was performed with intravenous contrast. Multiplanar CT image reconstructions were also generated. RADIATION DOSE REDUCTION: This exam was performed according to the departmental dose-optimization program which includes automated exposure control, adjustment of the mA and/or kV according to patient size and/or use of iterative reconstruction technique. CONTRAST:  23m OMNIPAQUE IOHEXOL 300 MG/ML  SOLN COMPARISON:  None Available. FINDINGS: Osseous: No fracture or mandibular dislocation. No destructive process. There are periapical lucencies of the maxillary molars. Orbits: Negative. No traumatic or inflammatory finding. Sinuses: Right maxillary sinus mucosal thickening. Soft tissues: Mild lower right facial subcutaneous induration without abscess or fluid collection. Limited intracranial: Normal IMPRESSION: 1. Mild lower right facial subcutaneous inflammation without abscess or fluid collection. 2. Odontogenic right maxillary sinusitis. Electronically Signed   By: KUlyses JarredM.D.   On: 01/22/2022 00:27   DG Chest 2 View  Result Date: 01/21/2022 CLINICAL DATA:   Intermittent palpitations with headache and right-sided facial swelling. EXAM: CHEST - 2 VIEW COMPARISON:  August 23, 2020 FINDINGS: The heart size and mediastinal contours are within normal limits. Both lungs are clear. The visualized skeletal structures are unremarkable. IMPRESSION: No active cardiopulmonary disease. Electronically Signed   By: TVirgina NorfolkM.D.   On: 01/21/2022 21:00    ED COURSE and MDM  Nursing notes, initial and subsequent vitals signs, including pulse oximetry, reviewed and interpreted by myself.  Vitals:   01/21/22 2028 01/21/22 2028 01/21/22 2248 01/21/22 2330  BP:  133/83 134/78 122/80  Pulse:  (!) 101 81 78  Resp:  _0 Temp:  98.6 F (37 C)    TempSrc:  Oral    SpO2:  100% 96% 95%  Weight: 93.1 kg     Height: 5' 8.5" (1.74 m)      Medications  ceFAZolin (ANCEF) IVPB 1 g/50 mL premix (has no administration in time range)  ondansetron (ZOFRAN) injection 4 mg (4 mg Intravenous Given 01/21/22 2346)  fentaNYL (SUBLIMAZE) injection 100 mcg (100 mcg Intravenous Given 01/21/22 2346)  iohexol (OMNIPAQUE) 300 MG/ML solution 75 mL (75 mLs Intravenous Contrast Given 01/21/22 2355)   12:57 AM CT changes are concerning for an early right facial cellulitis, possibly associated with the right maxillary sinusitis seen.  We will start her on antibiotics.  The right-sided headache and right facial paresthesias may be due to pressure on the facial nerve branches associated with the swelling.   PROCEDURES  Procedures   ED DIAGNOSES     ICD-10-CM   1. Facial cellulitis  LT70.017         MShanon Rosser MD 01/22/22 0100

## 2022-01-21 NOTE — ED Triage Notes (Signed)
C/O headache on posterior and temporal side of head; c/o swelling on right side of the face as well as numbness in that area. C/O intermittent palpitations as well.

## 2022-01-21 NOTE — ED Notes (Signed)
Patient transported to CT 

## 2022-01-22 MED ORDER — CEFUROXIME AXETIL 500 MG PO TABS: 500.0000 mg | ORAL_TABLET | Freq: Two times a day (BID) | ORAL | 0 refills | Status: DC

## 2022-01-22 MED ORDER — CEFAZOLIN SODIUM-DEXTROSE 1-4 GM/50ML-% IV SOLN
1.0000 g | Freq: Once | INTRAVENOUS | Status: AC
  Administered 2022-01-22: 1 g via INTRAVENOUS
  Filled 2022-01-22: qty 50

## 2022-01-22 MED ORDER — FLUCONAZOLE 150 MG PO TABS: ORAL_TABLET | ORAL | 0 refills | Status: DC

## 2022-01-24 ENCOUNTER — Other Ambulatory Visit: Payer: Self-pay

## 2022-01-24 ENCOUNTER — Encounter (HOSPITAL_BASED_OUTPATIENT_CLINIC_OR_DEPARTMENT_OTHER): Payer: Self-pay | Admitting: Emergency Medicine

## 2022-01-24 ENCOUNTER — Ambulatory Visit (INDEPENDENT_AMBULATORY_CARE_PROVIDER_SITE_OTHER): Payer: 59 | Admitting: Nurse Practitioner

## 2022-01-24 ENCOUNTER — Emergency Department (HOSPITAL_BASED_OUTPATIENT_CLINIC_OR_DEPARTMENT_OTHER)
Admission: EM | Admit: 2022-01-24 | Discharge: 2022-01-24 | Disposition: A | Discharge status: Home / Self Care | Payer: 59 | Attending: Emergency Medicine | Admitting: Emergency Medicine

## 2022-01-24 ENCOUNTER — Encounter: Payer: Self-pay | Admitting: Nurse Practitioner

## 2022-01-24 VITALS — BP 122/86 | HR 84 | Temp 98.6°F | Ht 68.0 in | Wt 205.6 lb

## 2022-01-24 DIAGNOSIS — E119 Type 2 diabetes mellitus without complications: Secondary | ICD-10-CM | POA: Diagnosis not present

## 2022-01-24 DIAGNOSIS — R519 Headache, unspecified: Secondary | ICD-10-CM | POA: Diagnosis present

## 2022-01-24 DIAGNOSIS — L03211 Cellulitis of face: Secondary | ICD-10-CM | POA: Diagnosis not present

## 2022-01-24 DIAGNOSIS — Z7984 Long term (current) use of oral hypoglycemic drugs: Secondary | ICD-10-CM | POA: Diagnosis not present

## 2022-01-24 LAB — CBC WITH DIFFERENTIAL/PLATELET
Abs Immature Granulocytes: 0.02 10*3/uL (ref 0.00–0.07)
Basophils Absolute: 0.1 10*3/uL (ref 0.0–0.1)
Basophils Relative: 1 %
Eosinophils Absolute: 0.3 10*3/uL (ref 0.0–0.5)
Eosinophils Relative: 3 %
HCT: 43.2 % (ref 36.0–46.0)
Hemoglobin: 14.5 g/dL (ref 12.0–15.0)
Immature Granulocytes: 0 %
Lymphocytes Relative: 26 %
Lymphs Abs: 2.4 10*3/uL (ref 0.7–4.0)
MCH: 29.5 pg (ref 26.0–34.0)
MCHC: 33.6 g/dL (ref 30.0–36.0)
MCV: 88 fL (ref 80.0–100.0)
Monocytes Absolute: 0.7 10*3/uL (ref 0.1–1.0)
Monocytes Relative: 8 %
Neutro Abs: 5.6 10*3/uL (ref 1.7–7.7)
Neutrophils Relative %: 62 %
Platelets: 394 10*3/uL (ref 150–400)
RBC: 4.91 MIL/uL (ref 3.87–5.11)
RDW: 13 % (ref 11.5–15.5)
WBC: 9.1 10*3/uL (ref 4.0–10.5)
nRBC: 0 % (ref 0.0–0.2)

## 2022-01-24 LAB — BASIC METABOLIC PANEL
Anion gap: 7 (ref 5–15)
BUN: 19 mg/dL (ref 6–20)
CO2: 27 mmol/L (ref 22–32)
Calcium: 10 mg/dL (ref 8.9–10.3)
Chloride: 103 mmol/L (ref 98–111)
Creatinine, Ser: 0.95 mg/dL (ref 0.44–1.00)
GFR, Estimated: >60 mL/min (ref >60)
Glucose, Bld: 104 mg/dL — ABNORMAL HIGH (ref 70–99)
Potassium: 4.1 mmol/L (ref 3.5–5.1)
Sodium: 137 mmol/L (ref 135–145)

## 2022-01-24 MED ORDER — KETOROLAC TROMETHAMINE 15 MG/ML IJ SOLN
15.0000 mg | Freq: Once | INTRAMUSCULAR | Status: AC
Start: 2022-01-24 — End: 2022-01-24
  Administered 2022-01-24: 15 mg via INTRAVENOUS
  Filled 2022-01-24: qty 1

## 2022-01-24 MED ORDER — CEFTRIAXONE SODIUM 1 G IJ SOLR
1.0000 g | Freq: Once | INTRAMUSCULAR | Status: AC
  Administered 2022-01-24: 1 g via INTRAVENOUS
  Filled 2022-01-24: qty 10

## 2022-01-24 NOTE — Progress Notes (Signed)
Acute Office Visit  Subjective:     Patient ID: Anne Lewis, female    DOB: 1965-07-30, 56 y.o.   MRN: [id number]  Chief Complaint  Patient presents with   Acute Visit    R side of face is swollen -facial cellulitis due to sinus infection per ER, , fever X4 days, headache, GI issues, antibiotics not helping.    HPI Patient is in today for cellulitis of the right side of her face. She went to the ER on 01/21/22 and had a CT scan which showed the beginning of cellulitis. She was given IV ancef and discharged on cefuroxime. She is still having ongoing swelling and fevers. She states the antibiotics are not helping.   She states that her symptoms started 5 days ago with a runny nose and nasal congestion with a right sided headache. She states that yesterday she still had a lot of swelling and pain. She states that even her gum started swelling yesterday. Yesterday she had a fever of 101. She is experiencing nausea taking zofran. She states that she also had a skin infection a month ago over her hands and legs.   ROS See pertinent positives and negatives per HPI.     Objective:    BP 122/86 (BP Location: Right Arm, Patient Position: Sitting, Cuff Size: Large)   Pulse 84   Temp 98.6 F (37 C) (Oral)   Ht '5\' 8"'$  (1.727 m)   Wt 205 lb 9.6 oz (93.3 kg)   LMP 03/14/2018   SpO2 98%   BMI 31.26 kg/m    Physical Exam Vitals and nursing note reviewed.  Constitutional:      General: She is not in acute distress.    Appearance: Normal appearance.  HENT:     Head: Normocephalic.     Right Ear: Tympanic membrane, ear canal and external ear normal.     Left Ear: Tympanic membrane, ear canal and external ear normal.     Nose:     Right Sinus: Maxillary sinus tenderness and frontal sinus tenderness present.     Comments: Swelling to right cheek Eyes:     Conjunctiva/sclera: Conjunctivae normal.  Cardiovascular:     Rate and Rhythm: Normal rate and regular rhythm.     Pulses:  Normal pulses.     Heart sounds: Normal heart sounds.  Pulmonary:     Effort: Pulmonary effort is normal.     Breath sounds: Normal breath sounds.  Musculoskeletal:     Cervical back: Normal range of motion and neck supple. No tenderness.  Lymphadenopathy:     Cervical: No cervical adenopathy.  Skin:    General: Skin is warm.  Neurological:     General: No focal deficit present.     Mental Status: She is alert and oriented to person, place, and time.  Psychiatric:        Mood and Affect: Mood normal.        Behavior: Behavior normal.        Thought Content: Thought content normal.        Judgment: Judgment normal.     Results for orders placed or performed during the hospital encounter of 01/24/22  CBC with Differential  Result Value Ref Range   WBC 9.1 4.0 - 10.5 K/uL   RBC 4.91 3.87 - 5.11 MIL/uL   Hemoglobin 14.5 12.0 - 15.0 g/dL   HCT 43.2 36.0 - 46.0 %   MCV 88.0 80.0 - 100.0 fL   MCH 29.5  26.0 - 34.0 pg   MCHC 33.6 30.0 - 36.0 g/dL   RDW 13.0 11.5 - 15.5 %   Platelets 394 150 - 400 K/uL   nRBC 0.0 0.0 - 0.2 %   Neutrophils Relative % 62 %   Neutro Abs 5.6 1.7 - 7.7 K/uL   Lymphocytes Relative 26 %   Lymphs Abs 2.4 0.7 - 4.0 K/uL   Monocytes Relative 8 %   Monocytes Absolute 0.7 0.1 - 1.0 K/uL   Eosinophils Relative 3 %   Eosinophils Absolute 0.3 0.0 - 0.5 K/uL   Basophils Relative 1 %   Basophils Absolute 0.1 0.0 - 0.1 K/uL   Immature Granulocytes 0 %   Abs Immature Granulocytes 0.02 0.00 - 0.07 K/uL  Basic metabolic panel  Result Value Ref Range   Sodium 137 135 - 145 mmol/L   Potassium 4.1 3.5 - 5.1 mmol/L   Chloride 103 98 - 111 mmol/L   CO2 27 22 - 32 mmol/L   Glucose, Bld 104 (H) 70 - 99 mg/dL   BUN 19 6 - 20 mg/dL   Creatinine, Ser 0.95 0.44 - 1.00 mg/dL   Calcium 10.0 8.9 - 10.3 mg/dL   GFR, Estimated >60 >60 mL/min   Anion gap 7 5 - 15        Assessment & Plan:   Problem List Items Addressed This Visit       Other   Facial cellulitis  - Primary    Facial cellulitis that was diagnosed after a visit to the ER on 01/21/22. She states that she is still having fevers, yesterday was 101, and significant pain and mild swelling to maxillary sinus. She does not have fever today, BP and HR stable. Discussed with Dr. Ethelene Hal and with ongoing symptoms and significant pain, will refer her back to the ER for further evaluation and possible IV antibiotics. Encouraged her to schedule an appointment after she is discharged.        No orders of the defined types were placed in this encounter.   Return if symptoms worsen or fail to improve, for after ER .  Charyl Dancer, NP

## 2022-01-24 NOTE — ED Triage Notes (Signed)
Pt sent here by PCP for IV ABX for right sided facial cellulitis.  Endorses fever yesterday, and pain and swelling in face.

## 2022-01-24 NOTE — Assessment & Plan Note (Signed)
Facial cellulitis that was diagnosed after a visit to the ER on 01/21/22. She states that she is still having fevers, yesterday was 101, and significant pain and mild swelling to maxillary sinus. She does not have fever today, BP and HR stable. Discussed with Dr. Ethelene Hal and with ongoing symptoms and significant pain, will refer her back to the ER for further evaluation and possible IV antibiotics. Encouraged her to schedule an appointment after she is discharged.

## 2022-01-24 NOTE — Patient Instructions (Signed)
With the amount of swelling and pain you are having and your fever yesterday, I recommend you go back to the ER for further evaluation. You may need more IV antibiotics.

## 2022-01-24 NOTE — Discharge Instructions (Signed)
Overall, your blood work is improving today.  Please continue previously prescribed antibiotics.  Return with high fever or worsening severe facial swelling or pain.  Your CT scan performed at previous visit did show some evidence that infection may have started at the root of the molars on the upper right side.  Please follow-up with a dentist for evaluation.

## 2022-01-24 NOTE — ED Provider Notes (Signed)
[facility] EMERGENCY DEPARTMENT Provider Note   CSN: [id number] Arrival date & time: 01/24/22  1442     History  No chief complaint on file.   Anne Lewis is a 56 y.o. female.  Patient with h/o DM presents to the ED today for continued right-sided facial pain and swelling.  She had PCP follow-up today and was referred back to the emergency department.  Patient was seen on the morning of 01/22/2022.  She had a CT scan at that time demonstrating right-sided maxillary sinusitis with associated facial cellulitis.  Patient was started on cefuroxime which she has been taking.  She had fevers to 101 F, last fever was yesterday, afebrile today.  She notes not resolved, but improving right-sided facial swelling.  She continues to have pain in her face which radiates to her forehead and right scalp.  Per patient report, PCP was concerned for spreading infection and recommended that she come back to the emergency department to be reevaluated.  Patient reports waking up 3 times last night with the urge to use the restroom, but feeling like she has to "push out the urine".  She reports lower back pain without fecal incontinence since onset of symptoms 4 days ago.  No saddle paresthesias.  She is able to ambulate. Patient denies signs of stroke including: facial droop, slurred speech, aphasia, weakness/numbness in extremities, imbalance/trouble walking.        Home Medications Prior to Admission medications   Medication Sig Start Date End Date Taking? Authorizing Provider  buPROPion (WELLBUTRIN SR) 150 MG 12 hr tablet Take 1 tablet (150 mg total) by mouth in the morning. 01/02/22   Whitmire, Joneen Boers, FNP  cefUROXime (CEFTIN) 500 MG tablet Take 1 tablet (500 mg total) by mouth 2 (two) times daily with a meal. 01/22/22   Molpus, John, MD  fluconazole (DIFLUCAN) 150 MG tablet Take 1 tablet as needed for vaginal yeast infection.  May repeat in 3 days if symptoms persist. Patient not taking:  Reported on 01/24/2022 01/22/22   Molpus, Jenny Reichmann, MD  hydrOXYzine (VISTARIL) 50 MG capsule Take 1 capsule (50 mg total) by mouth 3 (three) times daily as needed. 12/04/21   Bonnita Hollow, MD  hyoscyamine (LEVSIN SL) 0.125 MG SL tablet Place 1 tablet (0.125 mg total) under the tongue every 6 (six) hours as needed for cramping. 11/22/21   Milus Banister, MD  metFORMIN (GLUCOPHAGE-XR) 500 MG 24 hr tablet Take 1 tablet (500 mg total) by mouth daily with breakfast. 12/11/21   Libby Maw, MD  naproxen sodium (ALEVE) 220 MG tablet Take 440 mg by mouth daily as needed.    [provider]  ondansetron (ZOFRAN) 4 MG tablet Take 1 tablet (4 mg total) by mouth every 8 (eight) hours as needed for nausea or vomiting. 01/02/22 01/02/23  Whitmire, Joneen Boers, FNP  Vitamin D, Ergocalciferol, (DRISDOL) 1.25 MG (50000 UNIT) CAPS capsule Take 1 capsule (50,000 Units total) by mouth every 3 (three) days. 11/21/21   Dennard Nip D, MD  ZOLMitriptan (ZOMIG) 2.5 MG tablet Take 1 tablet (2.5 mg total) by mouth once for 1 dose. May repeat in 2 hours if headache persists or recurs. 10/11/21 11/07/97  Libby Maw, MD      Allergies    Aspirin, Augmentin [amoxicillin-pot clavulanate], and Oxycodone    Review of Systems   Review of Systems  Physical Exam Updated Vital Signs BP (!) 125/94 (BP Location: Right Arm)   Pulse 85   Temp  98.8 F (37.1 C) (Oral)   Resp 18   LMP 03/14/2018   SpO2 100%  Physical Exam Vitals and nursing note reviewed.  Constitutional:      General: She is not in acute distress.    Appearance: She is well-developed.  HENT:     Head: Normocephalic and atraumatic.     Comments: No gross right-sided facial swelling.  Patient is very tender over the right maxillary sinus area.  No overlying erythema or palpable abscess.    Right Ear: External ear normal.     Left Ear: External ear normal.     Nose: Nose normal.     Mouth/Throat:     Comments: No obvious dental abscess.   Patient is unable to tolerate me touching her right cheek. Eyes:     Extraocular Movements: Extraocular movements intact.     Conjunctiva/sclera: Conjunctivae normal.     Pupils: Pupils are equal, round, and reactive to light.     Comments: EOMI, pupils equal round and reactive  Cardiovascular:     Rate and Rhythm: Normal rate and regular rhythm.     Heart sounds: No murmur heard. Pulmonary:     Effort: No respiratory distress.     Breath sounds: No wheezing, rhonchi or rales.  Abdominal:     Palpations: Abdomen is soft.     Tenderness: There is no abdominal tenderness. There is no guarding or rebound.  Musculoskeletal:     Cervical back: Normal range of motion and neck supple.     Right lower leg: No edema.     Left lower leg: No edema.  Skin:    General: Skin is warm and dry.     Findings: No rash.  Neurological:     General: No focal deficit present.     Mental Status: She is alert. Mental status is at baseline.     Motor: No weakness.  Psychiatric:        Mood and Affect: Mood normal.     ED Results / Procedures / Treatments   Labs (all labs ordered are listed, but only abnormal results are displayed) Labs Reviewed  CBC WITH DIFFERENTIAL/PLATELET  BASIC METABOLIC PANEL  URINALYSIS, ROUTINE W REFLEX MICROSCOPIC    EKG None  Radiology No results found.  Procedures Procedures    Medications Ordered in ED Medications  cefTRIAXone (ROCEPHIN) 1 g in sodium chloride 0.9 % 100 mL IVPB (has no administration in time range)    ED Course/ Medical Decision Making/ A&P    Patient seen and examined. History obtained directly from patient.  I reviewed previous ED visit as well as reviewed CT images from previous visit.  Clinically, based on being afebrile today and on decreasing right-sided facial swelling, she seems to be improving.  However she continues to have pain in the right face, which is fairly unexpected given that her symptoms are continuing to resolve.  She  does not have any neurodeficits.  I do not think she needs reimage at this time.  She was sent for reevaluation and therefore will obtain lab work and give a dose of IV Rocephin she is currently on a third-generation cephalosporin.  Unsure what to make of her urine symptoms, will check UA to evaluate for possible UTI.  I do not really get the sense that she has cauda equina as she is able to urinate and is not having any fecal incontinence or other red flag symptoms.  Labs/EKG: Ordered CBC, BMP, UA.  Imaging: Considered  reimaging of the maxillofacial area, however patient overall seems to have improving symptoms other than persistent pain.  Medications/Fluids: IV Rocephin, IV Toradol.  Most recent vital signs reviewed and are as follows: BP (!) 125/94 (BP Location: Right Arm)   Pulse 85   Temp 98.8 F (37.1 C) (Oral)   Resp 18   LMP 03/14/2018   SpO2 100%   Initial impression: Right maxillary sinusitis with associated facial cellulitis, low concern for abscess.  7:19 PM Reassessment performed. Patient appears stable, more comfortable.  States that the Toradol did not help much however.  IV antibiotics completed.  Labs personally reviewed and interpreted including: CBC with white blood cell count trending towards improvement otherwise unremarkable; BMP glucose 104 without other significant findings.  Reviewed pertinent lab work and imaging with patient at bedside. Questions answered.   Most current vital signs reviewed and are as follows: BP 116/81 (BP Location: Left Arm)   Pulse 75   Temp 98.8 F (37.1 C) (Oral)   Resp 14   LMP 03/14/2018   SpO2 99%   Plan: Discharge to home.   Prescriptions written for: None  Other home care instructions discussed: We will continue OTC meds and previously prescribed antibiotic.  ED return instructions discussed: Worsening facial pain, swelling, high persistent fever.  Follow-up instructions discussed: Patient encouraged to follow-up with  their PCP in 3 days for recheck to ensure continued improvement and resolution.  Also, based on previous CT results, encouraged to follow-up with dentist.                          Medical Decision Making Amount and/or Complexity of Data Reviewed Labs: ordered.   Patient with recent diagnosis of facial cellulitis and sinusitis.  As discussed above, overall seems to be improving, however patient continues to have pain and she was sent to the emergency department by PCP today for further evaluation.  White blood cell count is trending towards improvement.  No fevers here.  Patient appears well, nontoxic.  Symptoms are controlled.  No palpable abscess or worsening swelling.  I doubt development of abscess or other complication of ongoing infection.  She will need to be followed until her symptoms resolved.  No indications for admission at this time.        Final Clinical Impression(s) / ED Diagnoses Final diagnoses:  Facial cellulitis  Facial pain    Rx / DC Orders ED Discharge Orders     None         Carlisle Cater, Hershal Coria 01/24/22 Vilma Prader, MD 01/25/22 [phone]

## 2022-01-24 NOTE — ED Notes (Signed)
Pt states no relief from Toradol IV, pain at rt side of face remains at an 8 on a 0-10 scale

## 2022-01-30 ENCOUNTER — Telehealth (INDEPENDENT_AMBULATORY_CARE_PROVIDER_SITE_OTHER): Payer: 59 | Admitting: Family Medicine

## 2022-01-30 ENCOUNTER — Encounter: Payer: 59 | Admitting: Obstetrics and Gynecology

## 2022-02-01 ENCOUNTER — Other Ambulatory Visit (INDEPENDENT_AMBULATORY_CARE_PROVIDER_SITE_OTHER): Payer: Self-pay | Admitting: Family Medicine

## 2022-02-01 DIAGNOSIS — F3289 Other specified depressive episodes: Secondary | ICD-10-CM

## 2022-02-05 ENCOUNTER — Ambulatory Visit (INDEPENDENT_AMBULATORY_CARE_PROVIDER_SITE_OTHER): Payer: 59 | Admitting: Family Medicine

## 2022-02-05 ENCOUNTER — Encounter (INDEPENDENT_AMBULATORY_CARE_PROVIDER_SITE_OTHER): Payer: Self-pay | Admitting: Family Medicine

## 2022-02-05 VITALS — BP 122/74 | HR 76 | Temp 97.9°F | Ht 68.0 in | Wt 205.0 lb

## 2022-02-05 DIAGNOSIS — E669 Obesity, unspecified: Secondary | ICD-10-CM

## 2022-02-05 DIAGNOSIS — F3289 Other specified depressive episodes: Secondary | ICD-10-CM | POA: Diagnosis not present

## 2022-02-05 DIAGNOSIS — Z6831 Body mass index (BMI) 31.0-31.9, adult: Secondary | ICD-10-CM | POA: Diagnosis not present

## 2022-02-05 DIAGNOSIS — E66811 Obesity, class 1: Secondary | ICD-10-CM

## 2022-02-05 MED ORDER — SAXENDA 18 MG/3ML ~~LOC~~ SOPN: 3.0000 mg | PEN_INJECTOR | Freq: Every day | SUBCUTANEOUS | 0 refills | Status: DC

## 2022-02-05 MED ORDER — BD PEN NEEDLE NANO U/F 32G X 4 MM MISC: 1.0000 [IU] | 0 refills | Status: AC

## 2022-02-05 MED ORDER — BUPROPION HCL ER (SR) 150 MG PO TB12
150.0000 mg | ORAL_TABLET | Freq: Every morning | ORAL | 0 refills | Status: DC
Start: 2022-02-05 — End: 2022-02-26

## 2022-02-07 ENCOUNTER — Encounter (INDEPENDENT_AMBULATORY_CARE_PROVIDER_SITE_OTHER): Payer: Self-pay

## 2022-02-07 ENCOUNTER — Telehealth (INDEPENDENT_AMBULATORY_CARE_PROVIDER_SITE_OTHER): Payer: Self-pay | Admitting: Family Medicine

## 2022-02-07 NOTE — Telephone Encounter (Signed)
Dr. Leafy Ro - Prior authorization denied for Saxenda. Per insurance: not a covered benefit/plan exclusion. Patient sent denial message via mychart.

## 2022-02-11 ENCOUNTER — Telehealth (INDEPENDENT_AMBULATORY_CARE_PROVIDER_SITE_OTHER): Payer: Self-pay | Admitting: Psychology

## 2022-02-11 NOTE — Progress Notes (Signed)
Office: [phone]  /  Fax: [phone]    Date: 02/25/2022   Appointment Start Time: 8:04am Duration: 26 minutes Provider: Glennie Isle, Psy.D. Type of Session: Individual Therapy  Location of Patient: Work (private location) Location of Provider: [employer] (private office) Type of Contact: Telepsychological Visit via MyChart Video Visit  Session Content:This provider called Anne Lewis at 8:03am as she did not present for today's appointment. She did not answer, but observed joining today's appointment via [place] Visit. As such, today's appointment was initiated 4 minutes late. Anne Lewis is a 56 y.o. female presenting for a follow-up appointment to address the previously established treatment goal of increasing coping skills.Today's appointment was a telepsychological visit. Pa provided verbal consent for today's telepsychological appointment and she is aware she is responsible for securing confidentiality on her end of the session. Prior to proceeding with today's appointment, Anne Lewis's physical location at the time of this appointment was obtained as well a phone number she could be reached at in the event of technical difficulties. Anne Lewis and this provider participated in today's telepsychological service.   This provider conducted a brief check-in. Anne Lewis shared she suffered "a very bad infection [cellulitis]" resulting in a reduction in appetite. Nevertheless, she described making better choices and engaging in portion control. While she is "back on track," she noted she is "not losing weight." Notably, she reported her husband asked for a divorce over the weekend, which impacted her appetite again. Associated thoughts and feelings were briefly processed. She denied any safety concerns. This provider again recommended longer-term therapeutic services. She explained due to issues with her insurance, she was unable to establish care. Anne Lewis reported she will be speaking  with her HR department today to determine options and she agreed to reach out to shared referral options. Due to recent events and challenges with appetite, psychoeducation was provided regarding self-compassion. Anne Lewis was engaged in a self-compassion exercise to help with eating-related challenges and other ongoing stressors. She was encouraged to regularly ask herself, "What do I need right now?" She discussed a desire to "cry it out" and then drink coffee and listen to her favorite music prior to resuming her work day. Overall, Anne Lewis was receptive to today's appointment as evidenced by openness to sharing, responsiveness to feedback, and willingness to work toward increasing self-compassion.  Mental Status Examination:  Appearance: neat Behavior: appropriate to circumstances Mood: sad Affect: mood congruent; tearful at times  Speech: WNL Eye Contact: appropriate Psychomotor Activity: WNL Gait: unable to assess Thought Process: linear, logical, and goal directed and denies suicidal, homicidal, and self-harm ideation, plan and intent  Thought Content/Perception: no hallucinations, delusions, bizarre thinking or behavior endorsed or observed Orientation: AAOx4 Memory/Concentration: memory, attention, language, and fund of knowledge intact  Insight: fair Judgment: fair  Interventions:  Conducted a brief chart review Provided empathic reflections and validation Employed supportive psychotherapy interventions to facilitate reduced distress and to improve coping skills with identified stressors Recommended/discussed option for longer-term therapeutic services Psychoeducation provided regarding self-compassion Engaged pt in a self-compassion exercise  DSM-5 Diagnosis(es):  Anne Lewis Adjustment Disorder, With Mixed Anxiety and Depressed Mood and F50.9 Unspecified Feeding or Eating Disorder  Treatment Goal & Progress: During the initial appointment with this provider, the following treatment  goal was established: increase coping skills. Anne Lewis has demonstrated progress in her goal as evidenced by increased awareness of portion control and food choices as well as willingness to engage in self-compassion exercises.  Plan: The next appointment is scheduled for 03/11/2022 at 8:30am, which will  be via [place] Visit. The next session will focus on working towards the established treatment goal. Anne Lewis will establish care for traditional therapeutic services.

## 2022-02-11 NOTE — Progress Notes (Signed)
Chief Complaint:   OBESITY Anne Lewis is here to discuss her progress with her obesity treatment plan along with follow-up of her obesity related diagnoses. [name] is on practicing portion control and making smarter food choices, such as increasing vegetables and decreasing simple carbohydrates and states she is following her eating plan approximately 80% of the time. [name] states she is doing cardio and walking for 60 minutes 7 times per week.  Today's visit was #: 6 Starting weight: 207 lbs Starting date: 11/07/2021 Today's weight: 205 lbs Today's date: 02/05/2022 Total lbs lost to date: 2 Total lbs lost since last in-office visit: 0  Interim History: Anne Lewis has not been able to follow her plan closely.  She is still trying to eat healthy, but not following a structured plan.  She is retaining fluid today.  She thinks her insurance company will cover obesity medications.  Subjective:   1. Other depression, emotional eating behaviors Anne Lewis is stable on Wellbutrin.  She notes no side effects and she requests a refill today.  Assessment/Plan:   1. Other depression, emotional eating behaviors Anne Lewis will continue Wellbutrin SR 150 mg once every morning, and we will refill for 1 month.  - buPROPion (WELLBUTRIN SR) 150 MG 12 hr tablet; Take 1 tablet (150 mg total) by mouth in the morning.  Dispense: 30 tablet; Refill: 0  2. Obesity, Current BMI 31.3 Anne Lewis is currently in the action stage of change. As such, her goal is to continue with weight loss efforts. She has agreed to practicing portion control and making smarter food choices, such as increasing vegetables and decreasing simple carbohydrates.   We discussed various medication options to help Anne Lewis with her weight loss efforts and we both agreed to start Saxenda 3.0 mg once every morning, and Nano needles #100 with no refills; (patient is to start Saxenda at 0.6 mg once every morning until her next visit).  -  Liraglutide -Weight Management (SAXENDA) 18 MG/3ML SOPN; Inject 3 mg into the skin daily.  Dispense: 15 mL; Refill: 0 - Insulin Pen Needle (BD PEN NEEDLE NANO U/F) 32G X 4 MM MISC; 1 Units by Does not apply route 1 day or 1 dose for 1 dose.  Dispense: 100 each; Refill: 0  Exercise goals: As is.   Behavioral modification strategies: increasing lean protein intake and increasing water intake.  Anne Lewis has agreed to follow-up with our clinic in 3 weeks with [facility], FNP-C, and then in 3 weeks after with myself. She was informed of the importance of frequent follow-up visits to maximize her success with intensive lifestyle modifications for her multiple health conditions.   Objective:   Blood pressure 122/74, pulse 76, temperature 97.9 F (36.6 C), height '5\' 8"'$  (1.727 m), weight 205 lb (93 kg), last menstrual period 03/14/2018, SpO2 99 %. Body mass index is 31.17 kg/m.  General: Cooperative, alert, well developed, in no acute distress. HEENT: Conjunctivae and lids unremarkable. Cardiovascular: Regular rhythm.  Lungs: Normal work of breathing. Neurologic: No focal deficits.   Lab Results  Component Value Date   CREATININE 0.95 01/24/2022   BUN 19 01/24/2022   NA 137 01/24/2022   K 4.1 01/24/2022   CL 103 01/24/2022   CO2 27 01/24/2022   Lab Results  Component Value Date   ALT 24 11/07/2021   AST 18 11/07/2021   ALKPHOS 116 11/07/2021   BILITOT <0.2 11/07/2021   Lab Results  Component Value Date   HGBA1C 5.7 (H) 11/07/2021   HGBA1C  5.9 06/18/2021   HGBA1C 5.9 08/30/2019   Lab Results  Component Value Date   INSULIN 19.9 11/07/2021   Lab Results  Component Value Date   TSH 0.537 11/07/2021   Lab Results  Component Value Date   CHOL 221 (H) 11/07/2021   HDL 44 11/07/2021   LDLCALC 154 (H) 11/07/2021   LDLDIRECT 156.0 06/18/2021   TRIG 125 11/07/2021   CHOLHDL 5.0 (H) 11/07/2021   Lab Results  Component Value Date   VD25OH 24.2 (L) 11/07/2021   VD25OH  12.59 (L) 06/18/2021   VD25OH 11.20 (L) 08/30/2019   Lab Results  Component Value Date   WBC 9.1 01/24/2022   HGB 14.5 01/24/2022   HCT 43.2 01/24/2022   MCV 88.0 01/24/2022   PLT 394 01/24/2022   No results found for: "IRON", "TIBC", "FERRITIN"  Attestation Statements:   Reviewed by clinician on day of visit: allergies, medications, problem list, medical history, surgical history, family history, social history, and previous encounter notes.  Time spent on visit including pre-visit chart review and post-visit care and charting was 30 minutes.   I, Trixie Dredge, am acting as transcriptionist for Dennard Nip, MD.  I have reviewed the above documentation for accuracy and completeness, and I agree with the above. -  Dennard Nip, MD

## 2022-02-11 NOTE — Telephone Encounter (Signed)
  Office: [phone]  /  Fax: [phone]  Date of Call: February 11, 2022  Time of Call: 9:45am Duration of Call: ~1.5 minute(s) Provider: Glennie Isle, PsyD  CONTENT:  This provider called Aalijah to check-in and schedule a follow-up appointment. She expressed appreciation of this provider's call and requested to schedule. She denied any safety concerns. All questions/concerns addressed.   PLAN:  Eddie is scheduled for an appointment on 02/25/2022 at 8am.

## 2022-02-25 ENCOUNTER — Telehealth (INDEPENDENT_AMBULATORY_CARE_PROVIDER_SITE_OTHER): Payer: Commercial Managed Care - HMO | Admitting: Psychology

## 2022-02-25 DIAGNOSIS — F509 Eating disorder, unspecified: Secondary | ICD-10-CM | POA: Diagnosis not present

## 2022-02-25 DIAGNOSIS — F4323 Adjustment disorder with mixed anxiety and depressed mood: Secondary | ICD-10-CM | POA: Diagnosis not present

## 2022-02-25 NOTE — Progress Notes (Signed)
TeleHealth Visit:  This visit was completed with telemedicine (audio/video) technology. Anne Lewis has verbally consented to this TeleHealth visit. The patient is located at home, the provider is located at home. The participants in this visit include the listed provider and patient. The visit was conducted today via MyChart video.  OBESITY Anne Lewis is here to discuss her progress with her obesity treatment plan along with follow-up of her obesity related diagnoses.   Today's visit was # 7 Starting weight: 207 lbs Starting date: 11/07/2021 Weight at last in office visit: 205 lbs on 02/05/22 Total weight loss: 2 lbs at last in office visit on 02/05/22. Today's reported weight:  No weight reported.   Nutrition Plan: practicing portion control and making smarter food choices, such as increasing vegetables and decreasing simple carbohydrates.   Current exercise: walking  for  1 hr 6-7 days per week except for this past week.   Interim History: Anne Lewis's husband asked for divorce this past weekend out of the blue.  She is very upset and has no appetite at all.  She is eating very little.  She says she is still drinking about a gallon of water per day. Anne Lewis was not covered by her insurance.  However she feels that she will be getting new insurance because of this recent family change.  Assessment/Plan:  1.  Other depression/emotional eating Anne Lewis has had issues with stress/emotional eating.  This is not an issue currently Overall mood is stable. Denies suicidal/homicidal ideation. Medication(s): Bupropion SR 150 mg every morning.  She has not noticed any difference at all in cravings or mood since starting the medication.  She also denies side effects.  Plan: Increase dose of bupropion to 200 mg daily. Refill bupropion SR 200 mg every morning.  2. Vitamin D Deficiency Vitamin D is not at goal of 50.  Vitamin D level low at 24.2 on 11/07/2021. She is on weekly prescription  Vitamin D 50,000 IU.  Lab Results  Component Value Date   VD25OH 24.2 (L) 11/07/2021   VD25OH 12.59 (L) 06/18/2021   VD25OH 11.20 (L) 08/30/2019    Plan: Refill prescription vitamin D 50,000 IU weekly.  3. Prediabetes Anne Lewis has a diagnosis of prediabetes based on her elevated HgA1c. Medication(s): Metformin XR 500 mg daily.  This was prescribed by her PCP. Lab Results  Component Value Date   HGBA1C 5.7 (H) 11/07/2021   Lab Results  Component Value Date   INSULIN 19.9 11/07/2021    Plan: Refill metformin XR 500 mg daily with breakfast.   4. Obesity: Current BMI 31.1 Anne Lewis is currently in the action stage of change. As such, her goal is to continue with weight loss efforts.  She has agreed to practicing portion control and making smarter food choices, such as increasing vegetables and decreasing simple carbohydrates.   Discussed that she really needs to try to eat even though she does not have an appetite.  We discussed options for protein such as Premier protein clear, soups with protein, yogurt.  She does not like protein shakes.  Exercise goals: as is  Behavioral modification strategies: increasing lean protein intake and meal planning and cooking strategies.  Anne Lewis has agreed to follow-up with our clinic in 4 weeks.   No orders of the defined types were placed in this encounter.   Medications Discontinued During This Encounter  Medication Reason   buPROPion (WELLBUTRIN SR) 150 MG 12 hr tablet Dose change   Liraglutide -Weight Management (SAXENDA) 18 MG/3ML SOPN Cost of medication  Vitamin D, Ergocalciferol, (DRISDOL) 1.25 MG (50000 UNIT) CAPS capsule Reorder   metFORMIN (GLUCOPHAGE-XR) 500 MG 24 hr tablet Reorder     Meds ordered this encounter  Medications   buPROPion (WELLBUTRIN SR) 200 MG 12 hr tablet    Sig: Take 1 tablet (200 mg total) by mouth in the morning.    Dispense:  30 tablet    Refill:  0    Order Specific Question:   Supervising  Provider    Answer:   Dennard Nip D [[id number]]   metFORMIN (GLUCOPHAGE-XR) 500 MG 24 hr tablet    Sig: Take 1 tablet (500 mg total) by mouth daily with breakfast.    Dispense:  90 tablet    Refill:  0    Order Specific Question:   Supervising Provider    Answer:   Dennard Nip D [[id number]]   Vitamin D, Ergocalciferol, (DRISDOL) 1.25 MG (50000 UNIT) CAPS capsule    Sig: Take 1 capsule (50,000 Units total) by mouth every 3 (three) days.    Dispense:  10 capsule    Refill:  0    Order Specific Question:   Supervising Provider    Answer:   Dennard Nip D [[id number]]      Objective:   VITALS: Per patient if applicable, see vitals. GENERAL: Alert and in no acute distress. CARDIOPULMONARY: No increased WOB. Speaking in clear sentences.  PSYCH: Pleasant and cooperative. Speech normal rate and rhythm. Affect is appropriate. Insight and judgement are appropriate. Attention is focused, linear, and appropriate.  NEURO: Oriented as arrived to appointment on time with no prompting.   Lab Results  Component Value Date   CREATININE 0.95 01/24/2022   BUN 19 01/24/2022   NA 137 01/24/2022   K 4.1 01/24/2022   CL 103 01/24/2022   CO2 27 01/24/2022   Lab Results  Component Value Date   ALT 24 11/07/2021   AST 18 11/07/2021   ALKPHOS 116 11/07/2021   BILITOT <0.2 11/07/2021   Lab Results  Component Value Date   HGBA1C 5.7 (H) 11/07/2021   HGBA1C 5.9 06/18/2021   HGBA1C 5.9 08/30/2019   Lab Results  Component Value Date   INSULIN 19.9 11/07/2021   Lab Results  Component Value Date   TSH 0.537 11/07/2021   Lab Results  Component Value Date   CHOL 221 (H) 11/07/2021   HDL 44 11/07/2021   LDLCALC 154 (H) 11/07/2021   LDLDIRECT 156.0 06/18/2021   TRIG 125 11/07/2021   CHOLHDL 5.0 (H) 11/07/2021   Lab Results  Component Value Date   WBC 9.1 01/24/2022   HGB 14.5 01/24/2022   HCT 43.2 01/24/2022   MCV 88.0 01/24/2022   PLT 394 01/24/2022   No results found for: "IRON",  "TIBC", "FERRITIN" Lab Results  Component Value Date   VD25OH 24.2 (L) 11/07/2021   VD25OH 12.59 (L) 06/18/2021   VD25OH 11.20 (L) 08/30/2019    Attestation Statements:   Reviewed by clinician on day of visit: allergies, medications, problem list, medical history, surgical history, family history, social history, and previous encounter notes.

## 2022-02-26 ENCOUNTER — Telehealth (INDEPENDENT_AMBULATORY_CARE_PROVIDER_SITE_OTHER): Payer: Commercial Managed Care - HMO | Admitting: Family Medicine

## 2022-02-26 ENCOUNTER — Encounter (INDEPENDENT_AMBULATORY_CARE_PROVIDER_SITE_OTHER): Payer: Self-pay | Admitting: Family Medicine

## 2022-02-26 DIAGNOSIS — E669 Obesity, unspecified: Secondary | ICD-10-CM

## 2022-02-26 DIAGNOSIS — Z7984 Long term (current) use of oral hypoglycemic drugs: Secondary | ICD-10-CM

## 2022-02-26 DIAGNOSIS — R7303 Prediabetes: Secondary | ICD-10-CM | POA: Diagnosis not present

## 2022-02-26 DIAGNOSIS — F3289 Other specified depressive episodes: Secondary | ICD-10-CM | POA: Diagnosis not present

## 2022-02-26 DIAGNOSIS — E559 Vitamin D deficiency, unspecified: Secondary | ICD-10-CM

## 2022-02-26 DIAGNOSIS — Z6831 Body mass index (BMI) 31.0-31.9, adult: Secondary | ICD-10-CM

## 2022-02-26 MED ORDER — VITAMIN D (ERGOCALCIFEROL) 1.25 MG (50000 UNIT) PO CAPS: 50000.0000 [IU] | ORAL_CAPSULE | ORAL | 0 refills | Status: DC

## 2022-02-26 MED ORDER — METFORMIN HCL ER 500 MG PO TB24: 500.0000 mg | ORAL_TABLET | Freq: Every day | ORAL | 0 refills | Status: DC

## 2022-02-26 MED ORDER — BUPROPION HCL ER (SR) 200 MG PO TB12: 200.0000 mg | ORAL_TABLET | Freq: Every morning | ORAL | 0 refills | Status: DC

## 2022-02-27 ENCOUNTER — Encounter (INDEPENDENT_AMBULATORY_CARE_PROVIDER_SITE_OTHER): Payer: Self-pay

## 2022-03-07 ENCOUNTER — Other Ambulatory Visit (INDEPENDENT_AMBULATORY_CARE_PROVIDER_SITE_OTHER): Payer: Self-pay | Admitting: Family Medicine

## 2022-03-07 DIAGNOSIS — F3289 Other specified depressive episodes: Secondary | ICD-10-CM

## 2022-03-11 ENCOUNTER — Telehealth (INDEPENDENT_AMBULATORY_CARE_PROVIDER_SITE_OTHER): Payer: Commercial Managed Care - HMO | Admitting: Psychology

## 2022-03-11 DIAGNOSIS — F4323 Adjustment disorder with mixed anxiety and depressed mood: Secondary | ICD-10-CM

## 2022-03-11 DIAGNOSIS — F509 Eating disorder, unspecified: Secondary | ICD-10-CM

## 2022-03-11 NOTE — Progress Notes (Signed)
  Office: [phone]  /  Fax: [phone]    Date: March 11, 2022    Appointment Start Time: 8:26am Duration: 26 minutes Provider: Glennie Isle, Psy.D. Type of Session: Individual Therapy  Location of Patient: Work (private location) Location of Provider: Provider's Home (private office) Type of Contact: Telepsychological Visit via MyChart Video Visit  Session Content: Anne Lewis is a 56 y.o. female presenting for a follow-up appointment to address the previously established treatment goal of increasing coping skills.Today's appointment was a telepsychological visit. Anne Lewis provided verbal consent for today's telepsychological appointment and she is aware she is responsible for securing confidentiality on her end of the session. Prior to proceeding with today's appointment, Anne Lewis's physical location at the time of this appointment was obtained as well a phone number she could be reached at in the event of technical difficulties. Anne Lewis and this provider participated in today's telepsychological service.   This provider conducted a brief check-in. Anne Lewis shared she continues to be "in shock with everything going on," adding she is "trying to take it one day at a time." She stated she has not established care with a primary therapist. Anne Lewis stated she contacted a couple providers, but did not schedule an appointment. This provider discussed the option of a referral with Anne Lewis; Anne Lewis provided verbal consent for this provider to place a referral to address ongoing stressors, including recent separation. Regarding eating habits, Anne Lewis reported experiencing decreased appetite due to recent separation, abdominal pain, sleep challenges, and hot flashes resulting in her drinking more water and feeling full. She was encouraged to contact her PCP; she agreed. She was engaged in problem solving to help her eat regularly and eat foods congruent to her structured meal  plan. She discussed a plan to eat smaller meals frequently; take snacks/meals to work congruent to structured meal plan; and set alarms/reminders on the phone to eat. Overall, Anne Lewis was receptive to today's appointment as evidenced by openness to sharing, responsiveness to feedback, and willingness to implement discussed strategies .  Mental Status Examination:  Appearance: neat Behavior: appropriate to circumstances Mood: sad Affect: mood congruent Speech: WNL Eye Contact: appropriate Psychomotor Activity: WNL Gait: unable to assess Thought Process: linear, logical, and goal directed and no evidence or endorsement of suicidal, homicidal, and self-harm ideation, plan and intent  Thought Content/Perception: no hallucinations, delusions, bizarre thinking or behavior endorsed or observed Orientation: AAOx4 Memory/Concentration: memory, attention, language, and fund of knowledge intact  Insight: fair Judgment: fair  Interventions:  Conducted a brief chart review Provided empathic reflections and validation Employed supportive psychotherapy interventions to facilitate reduced distress and to improve coping skills with identified stressors Engaged patient in problem solving Recommended/discussed option for longer-term therapeutic services  DSM-5 Diagnosis(es):  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 Anne Lewis has demonstrated progress in her goal as evidenced by increased awareness of portion control and food choices as well as willingness to engage in self-compassion exercises.  Plan: The next appointment is scheduled for 04/01/2022 at 8:30am, which will be via MyChart Video Visit. The next session will focus on working towards the established treatment goal. Anne Lewis  will establish care for traditional therapeutic services.

## 2022-03-19 ENCOUNTER — Ambulatory Visit (INDEPENDENT_AMBULATORY_CARE_PROVIDER_SITE_OTHER): Payer: Commercial Managed Care - HMO | Admitting: Family Medicine

## 2022-03-19 VITALS — BP 134/85 | HR 86 | Temp 98.0°F | Ht 68.0 in | Wt 205.0 lb

## 2022-03-19 DIAGNOSIS — F418 Other specified anxiety disorders: Secondary | ICD-10-CM

## 2022-03-19 DIAGNOSIS — E669 Obesity, unspecified: Secondary | ICD-10-CM

## 2022-03-19 DIAGNOSIS — Z6830 Body mass index (BMI) 30.0-30.9, adult: Secondary | ICD-10-CM | POA: Diagnosis not present

## 2022-03-19 MED ORDER — ESCITALOPRAM OXALATE 10 MG PO TABS: 10.0000 mg | ORAL_TABLET | Freq: Every day | ORAL | 0 refills | Status: DC

## 2022-03-22 ENCOUNTER — Ambulatory Visit (INDEPENDENT_AMBULATORY_CARE_PROVIDER_SITE_OTHER): Payer: Commercial Managed Care - HMO | Admitting: Behavioral Health

## 2022-03-22 DIAGNOSIS — F4323 Adjustment disorder with mixed anxiety and depressed mood: Secondary | ICD-10-CM | POA: Diagnosis not present

## 2022-03-22 NOTE — Progress Notes (Addendum)
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 [place]:  [name] requested: No   Reason for Visit /Presenting Problem: Pt has elevated anx/dep due to recent initiation of divorce process from current Husb: Anne Lewis  Mental Status Exam: Appearance:   Neat     Behavior:  Appropriate  Motor:  Normal  Speech/Language:   Clear and Coherent  Affect:  Appropriate  Mood:  angry and anxious  Thought process:  normal  Thought content:    WNL  Sensory/Perceptual disturbances:    WNL  Orientation:  oriented to person, place, and time/date  Attention:  Good  Concentration:  Good  Memory:  WNL  Fund of knowledge:   Good  Insight:    Good  Judgment:   Good  Impulse Control:  Good   Risk Assessment: Danger to Self:  No Self-injurious Behavior: No Danger to Others: No Duty to Warn:no Physical Aggression / Violence:No  Access to Firearms a concern: No  Gang Involvement:No  Patient / guardian was educated about steps to take if suicide or homicide risk level increases between visits: yes;Pt provided appropriate resources While future psychiatric events cannot be accurately predicted, the patient does not currently require acute inpatient psychiatric care and does not currently meet Anne Lewis involuntary commitment criteria.  Substance Abuse History: Current substance abuse: No     Past Psychiatric History:   No previous psychological problems have been observed Outpatient Providers: Dr. Otho Bellows, MD History of Psych Hospitalization: No  Psychological Testing:  NA    Abuse History:  Victim of: No.,  NA    Report needed: No. Victim of Neglect:No. Perpetrator of  NA   Witness / Exposure to Domestic Violence: No   Protective Services  Involvement: No  Witness to [employer] Violence:  No   Family History:  Family History  Problem Relation Age of Onset   Hypertension Mother    Cancer Mother 35       intestational and ovarian   Diverticulitis Mother    Depression Mother    Anxiety disorder Mother    Sleep apnea Mother    Stroke Father    Diabetes Father    Hypertension Father    Heart disease Father    Cancer Sister 99       ovarian and intestional   Cancer Brother 24       GI cancer   Cancer Maternal Grandmother 79       ovarian and pancreatic   Cancer Maternal Grandfather 82       pancreatic   Breast cancer Maternal Aunt 56   Cancer Paternal Aunt 73       stomach   Cancer Cousin 39       ovarian   Cancer Cousin 47       breast    Living situation: the patient lives with their family  Sexual Orientation: Straight  Relationship Status: married  Name of spouse / other: Anne Lewis If a parent, number of children / ages: 57yo, 39yo, & 63yo  Support Systems: Children & Retail banker Stress:  No   Income/Employment/Disability: 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 History of legal issue / charges:  NA  Medical History/Surgical History: reviewed Past Medical History:  Diagnosis Date   anemia    Anxiety    Aortic atherosclerosis ([place])    B12 deficiency    Back pain    BRCA negative 11/2013   Chest pain     Constipation    Fatty liver    Gallbladder problem    H pylori ulcer    Heartburn    Joint pain    Migraines    Palpitation    sleep apnea    Stomach ulcer    Ulcer of gastric fundus    Vitamin D deficiency     Past Surgical History:  Procedure Laterality Date   CESAREAN SECTION  8032,1224   ENDOMETRIAL CRYOABLATION  12.19.2012   HER OPTION - office performed   LAPAROSCOPIC APPENDECTOMY N/A 09/03/2021   Procedure: APPENDECTOMY LAPAROSCOPIC;  Surgeon: Anne Ok, MD;  Location: [facility];  Service: General;  Laterality: N/A;   TONSILLECTOMY  1987    Medications: Current Outpatient Medications  Medication Sig Dispense Refill   buPROPion (WELLBUTRIN SR) 200 MG 12 hr tablet Take 1 tablet (200 mg total) by mouth in the morning. 30 tablet 0   escitalopram (LEXAPRO) 10 MG tablet Take 1 tablet (10 mg total) by mouth daily. 30 tablet 0   hyoscyamine (LEVSIN SL) 0.125 MG SL tablet Place 1 tablet (0.125 mg total) under the tongue every 6 (six) hours as needed for cramping. 30 tablet 6   metFORMIN (GLUCOPHAGE-XR) 500 MG 24 hr tablet Take 1 tablet (500 mg total) by mouth daily with breakfast. 90 tablet 0   naproxen sodium (ALEVE) 220 MG tablet Take 440 mg by mouth daily as needed.     ondansetron (ZOFRAN) 4 MG tablet Take 1 tablet (4 mg total) by mouth every 8 (eight) hours as needed for nausea or vomiting. 30 tablet 0   Vitamin D, Ergocalciferol, (DRISDOL) 1.25 MG (50000 UNIT) CAPS capsule Take 1 capsule (50,000 Units total) by mouth every 3 (three) days. 10 capsule 0   ZOLMitriptan (ZOMIG) 2.5 MG tablet Take 1 tablet (2.5 mg total) by mouth once for 1 dose. May repeat in 2 hours if headache persists or recurs. 10 tablet 1   No current facility-administered medications for this visit.    Allergies  Allergen Reactions   Aspirin Swelling and Other (See Comments)    Bleeding   Augmentin [Amoxicillin-Pot Clavulanate] Swelling   Oxycodone Nausea And Vomiting    Diagnoses:   Adjustment disorder with mixed anxiety and depressed mood  Plan of Care: ST: Process recent events w/in the marital context as Husb has decided to get a divorce with no indicators of this in the past  LT: Process this traumatic event as she is trying to manage her anx/dep   Donnetta Hutching, LMFT

## 2022-03-22 NOTE — Progress Notes (Signed)
                Mirinda Monte L Meshell Abdulaziz, LMFT 

## 2022-03-26 NOTE — Progress Notes (Signed)
Chief Complaint:   OBESITY Anne Lewis is here to discuss her progress with her obesity treatment plan along with follow-up of her obesity related diagnoses. Anne Lewis is on practicing portion control and making smarter food choices, such as increasing vegetables and decreasing simple carbohydrates and states she is following her eating plan approximately 50% of the time. Anne Lewis states she is walking 2 miles for 60+ minutes 7 times per week.  Today's visit was #: 8 Starting weight: 207 lbs Starting date: 11/07/2021 Today's weight: 205 lbs Today's date: 03/19/2022 Total lbs lost to date: 2 Total lbs lost since last in-office visit: 0  Interim History: Anne Lewis hasn't been able to concentrate on weight loss as much due to increased family stress. She has done well with not gaining weight, but she is trying to get back on track.   Subjective:   1. Depression with anxiety Anne Lewis's husband recently left unexpectedly. She is naturally having a difficult time dealing with this. Her Wellbutrin was increased recently which was fortuitous. She is still seeing Dr. Mallie Mussel.   Assessment/Plan:   1. Depression with anxiety Anne Lewis agreed to start Lexapro 10 mg daily with no refills. She was encouraged to remember to take care of herself and work on some self-care and counseling.  - escitalopram (LEXAPRO) 10 MG tablet; Take 1 tablet (10 mg total) by mouth daily.  Dispense: 30 tablet; Refill: 0  2. Obesity, current BMI 31.3 Anne Lewis is currently in the action stage of change. As such, her goal is to continue with weight loss efforts. She has agreed to the Category 2 Plan.   Exercise goals: As is.   Behavioral modification strategies: no skipping meals and emotional eating strategies.  Anne Lewis has agreed to follow-up with our clinic in 2 to 3 weeks. She was informed of the importance of frequent follow-up visits to maximize her success with intensive lifestyle modifications for her multiple  health conditions.   Objective:   Blood pressure 134/85, pulse 86, temperature 98 F (36.7 C), height '5\' 8"'$  (1.727 m), weight 205 lb (93 kg), last menstrual period 03/14/2018, SpO2 98 %. Body mass index is 31.17 kg/m.  General: Cooperative, alert, well developed, in no acute distress. HEENT: Conjunctivae and lids unremarkable. Cardiovascular: Regular rhythm.  Lungs: Normal work of breathing. Neurologic: No focal deficits.   Lab Results  Component Value Date   CREATININE 0.95 01/24/2022   BUN 19 01/24/2022   NA 137 01/24/2022   K 4.1 01/24/2022   CL 103 01/24/2022   CO2 27 01/24/2022   Lab Results  Component Value Date   ALT 24 11/07/2021   AST 18 11/07/2021   ALKPHOS 116 11/07/2021   BILITOT <0.2 11/07/2021   Lab Results  Component Value Date   HGBA1C 5.7 (H) 11/07/2021   HGBA1C 5.9 06/18/2021   HGBA1C 5.9 08/30/2019   Lab Results  Component Value Date   INSULIN 19.9 11/07/2021   Lab Results  Component Value Date   TSH 0.537 11/07/2021   Lab Results  Component Value Date   CHOL 221 (H) 11/07/2021   HDL 44 11/07/2021   LDLCALC 154 (H) 11/07/2021   LDLDIRECT 156.0 06/18/2021   TRIG 125 11/07/2021   CHOLHDL 5.0 (H) 11/07/2021   Lab Results  Component Value Date   VD25OH 24.2 (L) 11/07/2021   VD25OH 12.59 (L) 06/18/2021   VD25OH 11.20 (L) 08/30/2019   Lab Results  Component Value Date   WBC 9.1 01/24/2022   HGB 14.5 01/24/2022  HCT 43.2 01/24/2022   MCV 88.0 01/24/2022   PLT 394 01/24/2022   No results found for: "IRON", "TIBC", "FERRITIN"  Attestation Statements:   Reviewed by clinician on day of visit: allergies, medications, problem list, medical history, surgical history, family history, social history, and previous encounter notes.  Time spent on visit including pre-visit chart review and post-visit care and charting was 30 minutes.   I, Trixie Dredge, am acting as transcriptionist for Dennard Nip, MD.  I have reviewed the above  documentation for accuracy and completeness, and I agree with the above. -  Dennard Nip, MD

## 2022-04-01 ENCOUNTER — Telehealth (INDEPENDENT_AMBULATORY_CARE_PROVIDER_SITE_OTHER): Payer: Commercial Managed Care - HMO | Admitting: Psychology

## 2022-04-01 DIAGNOSIS — F4323 Adjustment disorder with mixed anxiety and depressed mood: Secondary | ICD-10-CM

## 2022-04-01 DIAGNOSIS — F509 Eating disorder, unspecified: Secondary | ICD-10-CM

## 2022-04-01 NOTE — Progress Notes (Signed)
  Office: [phone]  /  Fax: [phone]    Date: April 01, 2022    Appointment Start Time: 8:01am Duration: 28 minutes Provider: Glennie Lewis, Psy.D. Type of Session: Individual Therapy  Location of Patient: Work (private location) Location of Provider: Provider's Home (private office) Type of Contact: Telepsychological Visit via MyChart Video Visit  Session Content: Anne Lewis is a 56 y.o. female presenting for a follow-up appointment to address the previously established treatment goal of increasing coping skills.Today's appointment was a telepsychological visit. Anne Lewis provided verbal consent for today's telepsychological appointment and she is aware she is responsible for securing confidentiality on her end of the session. Prior to proceeding with today's appointment, Anne Lewis's physical location at the time of this appointment was obtained as well a phone number she could be reached at in the event of technical difficulties. Anne Lewis and this provider participated in today's telepsychological service.   This provider conducted a brief check-in. Anne Lewis shared about her initial appointment with her primary therapist Anne Lewis). She also discussed changing her daily routine to cope with the separation. Regarding eating habits, she reported she is back to journaling again and noted implementing discussed strategies from the last appointment with this provider. Positive reinforcement was provided. Of note, Anne Lewis discussed frequently feeling nauseous regardless of what she eats/drinks. It was recommended she follow up with her PCP and Dr. Leafy Lewis. She agreed. Remainder of today's appointment focused on triggers for emotional eating behaviors. Psychoeducation regarding triggers for emotional eating was provided. Anne Lewis was provided a handout, and encouraged to utilize the handout between now and the next appointment to increase awareness of triggers and frequency. Anne Lewis  agreed. This provider also discussed behavioral strategies for specific triggers, such as placing the utensil down when conversing to avoid mindless eating. Anne Lewis provided verbal consent during today's appointment for this provider to send a handout about triggers via e-mail. Overall, Anne Lewis was receptive to today's appointment as evidenced by openness to sharing, responsiveness to feedback, and willingness to explore triggers for emotional eating.  Mental Status Examination:  Appearance: neat Behavior: appropriate to circumstances Mood: sad Affect: mood congruent Speech: WNL Eye Contact: appropriate Psychomotor Activity: WNL Gait: unable to assess Thought Process: linear, logical, and goal directed and no evidence or endorsement of suicidal, homicidal, and self-harm ideation, plan and intent  Thought Content/Perception: no hallucinations, delusions, bizarre thinking or behavior endorsed or observed Orientation: AAOx4 Memory/Concentration: memory, attention, language, and fund of knowledge intact  Insight: fair Judgment: fair  Interventions:  Conducted a brief chart review Provided empathic reflections and validation Reviewed content from the previous session Provided positive reinforcement Employed supportive psychotherapy interventions to facilitate reduced distress and to improve coping skills with identified stressors Psychoeducation provided regarding triggers for emotional eating behaviors  DSM-5 Diagnosis(es):  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 Anne Lewis has demonstrated progress in her goal as evidenced by increased awareness of portion control and food choices. Anne Lewis also continues to demonstrate willingness to engage in learned skill(s).  Plan:  The next appointment is scheduled for 04/16/2022 at 8:30am, which will be via MyChart Video Visit. The next session will focus on working towards the established treatment goal. Anne Lewis will continue meeting with her primary therapist.

## 2022-04-02 ENCOUNTER — Ambulatory Visit (INDEPENDENT_AMBULATORY_CARE_PROVIDER_SITE_OTHER): Payer: Commercial Managed Care - HMO | Admitting: Family Medicine

## 2022-04-02 ENCOUNTER — Encounter (INDEPENDENT_AMBULATORY_CARE_PROVIDER_SITE_OTHER): Payer: Self-pay | Admitting: Family Medicine

## 2022-04-02 VITALS — BP 132/78 | HR 87 | Temp 98.0°F | Ht 68.0 in | Wt 203.0 lb

## 2022-04-02 DIAGNOSIS — Z6830 Body mass index (BMI) 30.0-30.9, adult: Secondary | ICD-10-CM

## 2022-04-02 DIAGNOSIS — R112 Nausea with vomiting, unspecified: Secondary | ICD-10-CM | POA: Diagnosis not present

## 2022-04-02 DIAGNOSIS — E559 Vitamin D deficiency, unspecified: Secondary | ICD-10-CM

## 2022-04-02 DIAGNOSIS — E669 Obesity, unspecified: Secondary | ICD-10-CM | POA: Diagnosis not present

## 2022-04-02 DIAGNOSIS — F3289 Other specified depressive episodes: Secondary | ICD-10-CM | POA: Diagnosis not present

## 2022-04-02 MED ORDER — ESCITALOPRAM OXALATE 10 MG PO TABS
10.0000 mg | ORAL_TABLET | Freq: Every day | ORAL | 0 refills | Status: DC
Start: 2022-04-02 — End: 2022-05-28

## 2022-04-02 MED ORDER — METFORMIN HCL ER 500 MG PO TB24
500.0000 mg | ORAL_TABLET | Freq: Every day | ORAL | 0 refills | Status: DC
Start: 2022-04-02 — End: 2022-04-02

## 2022-04-02 MED ORDER — BUPROPION HCL ER (SR) 200 MG PO TB12: 200.0000 mg | ORAL_TABLET | Freq: Every morning | ORAL | 0 refills | Status: DC

## 2022-04-02 MED ORDER — VITAMIN D (ERGOCALCIFEROL) 1.25 MG (50000 UNIT) PO CAPS: 50000.0000 [IU] | ORAL_CAPSULE | ORAL | 0 refills | Status: DC

## 2022-04-08 NOTE — Progress Notes (Unsigned)
Chief Complaint:   OBESITY Anne Lewis is here to discuss her progress with her obesity treatment plan along with follow-up of her obesity related diagnoses. Anne Lewis is on the Category 2 Plan and states she is following her eating plan approximately 80% of the time. Anne Lewis states she is walking for 60 minutes 7 times per week.  Today's visit was #: 9 Starting weight: 207 lbs Starting date: 11/07/2021 Today's weight: 203 lbs Today's date: 04/02/2022 Total lbs lost to date: 4 Total lbs lost since last in-office visit: 2  Interim History: Anne Lewis   Subjective:   1. Nausea and vomiting in adult Anne Lewis has had increased nausea and some vomiting in the last 2 weeks.   2. Vitamin D deficiency Anne Lewis is stable on Vitamin D, and she requests a refill.   3. Other depression/emotional eating Anne Lewis is doing well on her medications, and she is working on decreasing emotional eating behaviors.   Assessment/Plan:   1. Nausea and vomiting in adult Anne Lewis is to hold metformin for now to see if her nausea improves.   2. Vitamin D deficiency Anne Lewis will continue prescription Vitamin D 50,000 IU every 3 days, and we will refill for 1 month.   - Vitamin D, Ergocalciferol, (DRISDOL) 1.25 MG (50000 UNIT) CAPS capsule; Take 1 capsule (50,000 Units total) by mouth every 3 (three) days.  Dispense: 10 capsule; Refill: 0  3. Other depression/emotional eating Anne Lewis will continue her medications, and we will refill Lexapro and Wellbutrin SR for 1 month.  - escitalopram (LEXAPRO) 10 MG tablet; Take 1 tablet (10 mg total) by mouth daily.  Dispense: 30 tablet; Refill: 0 - buPROPion (WELLBUTRIN SR) 200 MG 12 hr tablet; Take 1 tablet (200 mg total) by mouth in the morning.  Dispense: 30 tablet; Refill: 0  4. Obesity, current BMI 30.9 Anne Lewis is currently in the action stage of change. As such, her goal is to continue with weight loss efforts. She has agreed to the Category 2 Plan and  practicing portion control and making smarter food choices, such as increasing vegetables and decreasing simple carbohydrates.   Anne Lewis is to have GI issues evaluated by her PCP and we will continue to follow.   Exercise goals: As is.   Behavioral modification strategies: planning for success.  Anne Lewis has agreed to follow-up with our clinic in 4 weeks. She was informed of the importance of frequent follow-up visits to maximize her success with intensive lifestyle modifications for her multiple health conditions.   Objective:   Blood pressure 132/78, pulse 87, temperature 98 F (36.7 C), height '5\' 8"'$  (1.727 m), weight 203 lb (92.1 kg), last menstrual period 03/14/2018, SpO2 100 %. Body mass index is 30.87 kg/m.  General: Cooperative, alert, well developed, in no acute distress. HEENT: Conjunctivae and lids unremarkable. Cardiovascular: Regular rhythm.  Lungs: Normal work of breathing. Neurologic: No focal deficits.   Lab Results  Component Value Date   CREATININE 0.95 01/24/2022   BUN 19 01/24/2022   NA 137 01/24/2022   K 4.1 01/24/2022   CL 103 01/24/2022   CO2 27 01/24/2022   Lab Results  Component Value Date   ALT 24 11/07/2021   AST 18 11/07/2021   ALKPHOS 116 11/07/2021   BILITOT <0.2 11/07/2021   Lab Results  Component Value Date   HGBA1C 5.7 (H) 11/07/2021   HGBA1C 5.9 06/18/2021   HGBA1C 5.9 08/30/2019   Lab Results  Component Value Date   INSULIN 19.9 11/07/2021   Lab  Results  Component Value Date   TSH 0.537 11/07/2021   Lab Results  Component Value Date   CHOL 221 (H) 11/07/2021   HDL 44 11/07/2021   LDLCALC 154 (H) 11/07/2021   LDLDIRECT 156.0 06/18/2021   TRIG 125 11/07/2021   CHOLHDL 5.0 (H) 11/07/2021   Lab Results  Component Value Date   VD25OH 24.2 (L) 11/07/2021   VD25OH 12.59 (L) 06/18/2021   VD25OH 11.20 (L) 08/30/2019   Lab Results  Component Value Date   WBC 9.1 01/24/2022   HGB 14.5 01/24/2022   HCT 43.2 01/24/2022    MCV 88.0 01/24/2022   PLT 394 01/24/2022   No results found for: "IRON", "TIBC", "FERRITIN"  Attestation Statements:   Reviewed by clinician on day of visit: allergies, medications, problem list, medical history, surgical history, family history, social history, and previous encounter notes.   I, Trixie Dredge, am acting as transcriptionist for Dennard Nip, MD.  I have reviewed the above documentation for accuracy and completeness, and I agree with the above. -  Dennard Nip, MD

## 2022-04-09 ENCOUNTER — Other Ambulatory Visit (INDEPENDENT_AMBULATORY_CARE_PROVIDER_SITE_OTHER): Payer: Self-pay | Admitting: Family Medicine

## 2022-04-09 DIAGNOSIS — R112 Nausea with vomiting, unspecified: Secondary | ICD-10-CM

## 2022-04-15 NOTE — Progress Notes (Deleted)
TeleHealth Visit:  This visit was completed with telemedicine (audio/video) technology. Anne Lewis has verbally consented to this TeleHealth visit. The patient is located at home, the provider is located at home. The participants in this visit include the listed provider and patient. The visit was conducted today via MyChart video.  OBESITY Anne Lewis is here to discuss her progress with her obesity treatment plan along with follow-up of her obesity related diagnoses.   Today's visit was # 10 Starting weight: 207 lbs Starting date: 11/07/2021 Weight at last in office visit: 203 lbs on 04/02/22 Total weight loss: 4 lbs at last in office visit on 04/02/22. Today's reported weight: *** lbs No weight reported.  Nutrition Plan: the Category 2 Plan and practicing portion control and making smarter food choices, such as increasing vegetables and decreasing simple carbohydrates.  Hunger is {EWCONTROLASSESSMENT:24261}. Cravings are {EWCONTROLASSESSMENT:24261}.  Current exercise: {exercise types:16438}  Interim History: ***  Assessment/Plan:  1. ***  2. ***  3. ***  Obesity: Current BMI *** Anne Lewis {CHL AMB IS/IS NOT:210130109} currently in the action stage of change. As such, her goal is to {MWMwtloss#1:210800005}.  She has agreed to {MWMwtlossportion/plan2:23431}.   Exercise goals: {MWM EXERCISE RECS:23473}  Behavioral modification strategies: {MWMwtlossdietstrategies3:23432}.  Anne Lewis has agreed to follow-up with our clinic in {NUMBER 1-10:22536} weeks.   No orders of the defined types were placed in this encounter.   There are no discontinued medications.   No orders of the defined types were placed in this encounter.     Objective:   VITALS: Per patient if applicable, see vitals. GENERAL: Alert and in no acute distress. CARDIOPULMONARY: No increased WOB. Speaking in clear sentences.  PSYCH: Pleasant and cooperative. Speech normal rate and rhythm. Affect is  appropriate. Insight and judgement are appropriate. Attention is focused, linear, and appropriate.  NEURO: Oriented as arrived to appointment on time with no prompting.   Lab Results  Component Value Date   CREATININE 0.95 01/24/2022   BUN 19 01/24/2022   NA 137 01/24/2022   K 4.1 01/24/2022   CL 103 01/24/2022   CO2 27 01/24/2022   Lab Results  Component Value Date   ALT 24 11/07/2021   AST 18 11/07/2021   ALKPHOS 116 11/07/2021   BILITOT <0.2 11/07/2021   Lab Results  Component Value Date   HGBA1C 5.7 (H) 11/07/2021   HGBA1C 5.9 06/18/2021   HGBA1C 5.9 08/30/2019   Lab Results  Component Value Date   INSULIN 19.9 11/07/2021   Lab Results  Component Value Date   TSH 0.537 11/07/2021   Lab Results  Component Value Date   CHOL 221 (H) 11/07/2021   HDL 44 11/07/2021   LDLCALC 154 (H) 11/07/2021   LDLDIRECT 156.0 06/18/2021   TRIG 125 11/07/2021   CHOLHDL 5.0 (H) 11/07/2021   Lab Results  Component Value Date   WBC 9.1 01/24/2022   HGB 14.5 01/24/2022   HCT 43.2 01/24/2022   MCV 88.0 01/24/2022   PLT 394 01/24/2022   No results found for: "IRON", "TIBC", "FERRITIN" Lab Results  Component Value Date   VD25OH 24.2 (L) 11/07/2021   VD25OH 12.59 (L) 06/18/2021   VD25OH 11.20 (L) 08/30/2019    Attestation Statements:   Reviewed by clinician on day of visit: allergies, medications, problem list, medical history, surgical history, family history, social history, and previous encounter notes.  ***(delete if time-based billing not used) Time spent on visit including the items listed below was *** minutes.  -preparing to see the patient (e.g.,  review of tests, history, previous notes) -obtaining and/or reviewing separately obtained history -counseling and educating the patient/family/caregiver -documenting clinical information in the electronic or other health record -ordering medications, tests, or procedures -independently interpreting results and  communicating results to the patient/ family/caregiver -referring and communicating with other health care professionals  -care coordination

## 2022-04-16 ENCOUNTER — Ambulatory Visit (INDEPENDENT_AMBULATORY_CARE_PROVIDER_SITE_OTHER): Payer: Commercial Managed Care - HMO | Admitting: Family Medicine

## 2022-04-16 ENCOUNTER — Encounter (INDEPENDENT_AMBULATORY_CARE_PROVIDER_SITE_OTHER): Payer: Self-pay | Admitting: Family Medicine

## 2022-04-16 ENCOUNTER — Telehealth (INDEPENDENT_AMBULATORY_CARE_PROVIDER_SITE_OTHER): Payer: Commercial Managed Care - HMO | Admitting: Family Medicine

## 2022-04-16 ENCOUNTER — Ambulatory Visit: Payer: Commercial Managed Care - HMO | Admitting: Behavioral Health

## 2022-04-16 ENCOUNTER — Encounter (INDEPENDENT_AMBULATORY_CARE_PROVIDER_SITE_OTHER): Payer: Self-pay

## 2022-04-16 ENCOUNTER — Encounter: Payer: Self-pay | Admitting: Family Medicine

## 2022-04-16 ENCOUNTER — Telehealth (INDEPENDENT_AMBULATORY_CARE_PROVIDER_SITE_OTHER): Payer: Commercial Managed Care - HMO | Admitting: Psychology

## 2022-04-16 VITALS — BP 116/74 | HR 99 | Temp 97.9°F | Ht 68.0 in | Wt 202.2 lb

## 2022-04-16 DIAGNOSIS — F509 Eating disorder, unspecified: Secondary | ICD-10-CM

## 2022-04-16 DIAGNOSIS — R1031 Right lower quadrant pain: Secondary | ICD-10-CM

## 2022-04-16 DIAGNOSIS — K2100 Gastro-esophageal reflux disease with esophagitis, without bleeding: Secondary | ICD-10-CM

## 2022-04-16 DIAGNOSIS — K5909 Other constipation: Secondary | ICD-10-CM | POA: Diagnosis not present

## 2022-04-16 DIAGNOSIS — R1313 Dysphagia, pharyngeal phase: Secondary | ICD-10-CM

## 2022-04-16 DIAGNOSIS — F4323 Adjustment disorder with mixed anxiety and depressed mood: Secondary | ICD-10-CM

## 2022-04-16 DIAGNOSIS — R112 Nausea with vomiting, unspecified: Secondary | ICD-10-CM | POA: Diagnosis not present

## 2022-04-16 MED ORDER — ONDANSETRON 4 MG PO TBDP
4.0000 mg | ORAL_TABLET | Freq: Three times a day (TID) | ORAL | 1 refills | Status: DC | PRN
Start: 2022-04-16 — End: 2022-05-15

## 2022-04-16 MED ORDER — OMEPRAZOLE 20 MG PO CPDR: 20.0000 mg | DELAYED_RELEASE_CAPSULE | Freq: Every day | ORAL | 3 refills | Status: DC

## 2022-04-16 NOTE — Progress Notes (Signed)
  Office: [phone]  /  Fax: [phone]    Date: April 16, 2022    Appointment Start Time: 8:33am Duration: 34 minutes Provider: Glennie Isle, Psy.D. Type of Session: Individual Therapy  Location of Patient: Work (private location) Location of Provider: Provider's Home (private office) Type of Contact: Telepsychological Visit via MyChart Video Visit  Session Content: Anne Lewis is a 56 y.o. female presenting for a follow-up appointment to address the previously established treatment goal of increasing coping skills.Today's appointment was a telepsychological visit. Anne Lewis provided verbal consent for today's telepsychological appointment and she is aware she is responsible for securing confidentiality on her end of the session. Prior to proceeding with today's appointment, Anne Lewis's physical location at the time of this appointment was obtained as well a phone number she could be reached at in the event of technical difficulties. Anne Lewis and this provider participated in today's telepsychological service. Of note, today's appointment was switched to a regular telephone call at 8:35am with Anne Lewis's verbal consent due to technical issues.   This provider conducted a brief check-in. Anne Lewis continues to experience GI symptoms, noting she has an appointment with her PCP today to discuss further. She indicated Anne Lewis is aware of the symptoms. Explored and processed associated thoughts and feelings. Reviewed triggers for emotional eating behaviors. She described implementing discussed strategies. Positive reinforcement was provided. Remainder of today's appointment focused further on self-compassion given the circumstances. Reviewed self-compassion and engaged [name] in an exercise (how would you respond to a small child that felt like a failure) to help increase self-compassion. Overall, Anne Lewis was receptive to today's appointment as evidenced by openness to sharing, responsiveness  to feedback, and willingness to work toward increasing self-compassion.  Mental Status Examination:  Appearance: neat Behavior: appropriate to circumstances Mood: sad Affect: mood congruent Speech: WNL Eye Contact: appropriate Psychomotor Activity: WNL Gait: unable to assess Thought Process: linear, logical, and goal directed and no evidence or endorsement of suicidal, homicidal, and self-harm ideation, plan and intent  Thought Content/Perception: no hallucinations, delusions, bizarre thinking or behavior endorsed or observed Orientation: AAOx4 Memory/Concentration: memory, attention, language, and fund of knowledge intact  Insight: fair Judgment: fair  Interventions:  Conducted a brief chart review Provided empathic reflections and validation Reviewed content from the previous session Provided positive reinforcement Employed supportive psychotherapy interventions to facilitate reduced distress and to improve coping skills with identified stressors Engaged pt in a self-compassion exercise  DSM-5 Diagnosis(es):  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 Anne Lewis has demonstrated progress in her goal as evidenced by increased awareness of hunger patterns, increased awareness of triggers for emotional eating behaviors, and increased awareness of portion control and food choices. Anne Lewis also continues to demonstrate willingness to engage in learned skill(s).  Plan: The next appointment is scheduled for 04/29/2022 at 8:30am, which will be via MyChart Video Visit. The next session will focus on working towards the established treatment goal. Anne Lewis will continue with her primary therapist.

## 2022-04-16 NOTE — Progress Notes (Signed)
Established Patient Office Visit  Subjective   Patient ID: Anne Lewis, female    DOB: 1966/07/03  Age: 56 y.o. MRN: [id number]  Chief Complaint  Patient presents with   Pain    Lower right side and abdominal pain, vomiting, little headaches symptoms x 6 days.     HPI reports a 6 to 7-day history of intermittent nausea with vomiting.  She seems to be vomiting more than what she personally consumed.  Vomitus has been yellow in color with occasional green coloration.  She has developed dysphagia with foods that seem to become lodged in her esophagus near the base of her neck.  She has developed indigestion or reflux.  She has never experienced this issue before this time.  She continues to tolerate liquids for the most part.  She has been trying to keep yourself hydrated.  There have been no fever or chills.  She has experienced right lower quadrant pain.  Ongoing constipation responsive to MiraLAX as needed.  Denies diarrhea.  There is been no blood in her stool or melena.  Status post appendicitis with appendectomy several months back.  Recent CT of abdomen and pelvis was normal back in March.  She has lost no weight.  Denies dysuria vaginal discharge or urinary frequency.    Review of Systems  Constitutional: Negative.   HENT: Negative.    Eyes:  Negative for blurred vision, discharge and redness.  Respiratory: Negative.    Cardiovascular: Negative.   Gastrointestinal:  Positive for abdominal pain, constipation, nausea and vomiting. Negative for blood in stool, diarrhea and melena.  Genitourinary: Negative.  Negative for dysuria, frequency and urgency.  Musculoskeletal: Negative.  Negative for myalgias.  Skin:  Negative for rash.  Neurological:  Negative for tingling, loss of consciousness and weakness.  Endo/Heme/Allergies:  Negative for polydipsia.      Objective:     BP 116/74 (BP Location: Right Arm, Patient Position: Sitting, Cuff Size: Large)   Pulse 99   Temp 97.9  F (36.6 C) (Temporal)   Ht '5\' 8"'$  (1.727 m)   Wt 202 lb 3.2 oz (91.7 kg)   LMP 03/14/2018   SpO2 95%   BMI 30.74 kg/m  Wt Readings from Last 3 Encounters:  04/16/22 202 lb 3.2 oz (91.7 kg)  04/02/22 203 lb (92.1 kg)  03/19/22 205 lb (93 kg)      Physical Exam Constitutional:      General: She is not in acute distress.    Appearance: Normal appearance. She is not ill-appearing, toxic-appearing or diaphoretic.  HENT:     Head: Normocephalic and atraumatic.     Right Ear: External ear normal.     Left Ear: External ear normal.     Mouth/Throat:     Mouth: Mucous membranes are moist.     Pharynx: Oropharynx is clear. No oropharyngeal exudate or posterior oropharyngeal erythema.  Eyes:     General: No scleral icterus.       Right eye: No discharge.        Left eye: No discharge.     Extraocular Movements: Extraocular movements intact.     Conjunctiva/sclera: Conjunctivae normal.     Pupils: Pupils are equal, round, and reactive to light.  Cardiovascular:     Rate and Rhythm: Normal rate and regular rhythm.  Pulmonary:     Effort: Pulmonary effort is normal. No respiratory distress.     Breath sounds: Normal breath sounds.  Abdominal:     General: Bowel sounds  are normal. There is no distension.     Palpations: There is no mass.     Tenderness: There is no abdominal tenderness (RLQ without guard or rebound.). There is no guarding or rebound.  Musculoskeletal:     Cervical back: No rigidity or tenderness.  Skin:    General: Skin is warm and dry.  Neurological:     Mental Status: She is alert and oriented to person, place, and time.  Psychiatric:        Mood and Affect: Mood normal.        Behavior: Behavior normal.      No results found for any visits on 04/16/22.    The 10-year ASCVD risk score (Arnett DK, et al., 2019) is: 5.7%    Assessment & Plan:   Problem List Items Addressed This Visit       Other   OTHER CONSTIPATION   Other Visit Diagnoses      Pharyngeal dysphagia    -  Primary   Relevant Orders   Basic metabolic panel   Hepatic function panel   Urinalysis, Routine w reflex microscopic   CBC w/Diff   Ambulatory referral to Gastroenterology   Nausea and vomiting in adult       Relevant Medications   ondansetron (ZOFRAN-ODT) 4 MG disintegrating tablet   Other Relevant Orders   Amylase   Ambulatory referral to Gastroenterology   Right lower quadrant abdominal pain       Relevant Orders   Basic metabolic panel   Hepatic function panel   Urinalysis, Routine w reflex microscopic   CBC w/Diff   Ambulatory referral to Gastroenterology   Gastroesophageal reflux disease with esophagitis without hemorrhage       Relevant Medications   omeprazole (PRILOSEC) 20 MG capsule   Other Relevant Orders   Ambulatory referral to Gastroenterology       Return in about 4 weeks (around 05/14/2022), or Please proceed to ER if worse.Libby Maw, MD

## 2022-04-17 ENCOUNTER — Encounter (INDEPENDENT_AMBULATORY_CARE_PROVIDER_SITE_OTHER): Payer: Self-pay | Admitting: Family Medicine

## 2022-04-17 ENCOUNTER — Telehealth (INDEPENDENT_AMBULATORY_CARE_PROVIDER_SITE_OTHER): Payer: Commercial Managed Care - HMO | Admitting: Family Medicine

## 2022-04-17 DIAGNOSIS — R112 Nausea with vomiting, unspecified: Secondary | ICD-10-CM

## 2022-04-17 DIAGNOSIS — R7303 Prediabetes: Secondary | ICD-10-CM | POA: Diagnosis not present

## 2022-04-17 DIAGNOSIS — Z6830 Body mass index (BMI) 30.0-30.9, adult: Secondary | ICD-10-CM

## 2022-04-17 DIAGNOSIS — E669 Obesity, unspecified: Secondary | ICD-10-CM | POA: Diagnosis not present

## 2022-04-17 LAB — HEPATIC FUNCTION PANEL
ALT: 53 U/L — ABNORMAL HIGH (ref 0–35)
AST: 31 U/L (ref 0–37)
Albumin: 4.6 g/dL (ref 3.5–5.2)
Alkaline Phosphatase: 86 U/L (ref 39–117)
Bilirubin, Direct: 0.1 mg/dL (ref 0.0–0.3)
Total Bilirubin: 0.5 mg/dL (ref 0.2–1.2)
Total Protein: 8 g/dL (ref 6.0–8.3)

## 2022-04-17 LAB — BASIC METABOLIC PANEL
BUN: 23 mg/dL (ref 6–23)
CO2: 27 mEq/L (ref 19–32)
Calcium: 9.8 mg/dL (ref 8.4–10.5)
Chloride: 101 mEq/L (ref 96–112)
Creatinine, Ser: 1.04 mg/dL (ref 0.40–1.20)
GFR: 60.12 mL/min (ref >60.00)
Glucose, Bld: 90 mg/dL (ref 70–99)
Potassium: 4.4 mEq/L (ref 3.5–5.1)
Sodium: 135 mEq/L (ref 135–145)

## 2022-04-17 LAB — URINALYSIS, ROUTINE W REFLEX MICROSCOPIC
Hgb urine dipstick: NEGATIVE
Leukocytes,Ua: NEGATIVE
Nitrite: NEGATIVE
Specific Gravity, Urine: >=1.03 — AB (ref 1.000–1.030)
Total Protein, Urine: NEGATIVE
Urine Glucose: NEGATIVE
Urobilinogen, UA: 1 (ref 0.0–1.0)
pH: 5 (ref 5.0–8.0)

## 2022-04-17 LAB — CBC WITH DIFFERENTIAL/PLATELET
Basophils Absolute: 0.1 10*3/uL (ref 0.0–0.1)
Basophils Relative: 1.1 % (ref 0.0–3.0)
Eosinophils Absolute: 0.2 10*3/uL (ref 0.0–0.7)
Eosinophils Relative: 3.2 % (ref 0.0–5.0)
HCT: 40.3 % (ref 36.0–46.0)
Hemoglobin: 13.5 g/dL (ref 12.0–15.0)
Lymphocytes Relative: 20.9 % (ref 12.0–46.0)
Lymphs Abs: 1.6 10*3/uL (ref 0.7–4.0)
MCHC: 33.5 g/dL (ref 30.0–36.0)
MCV: 89.9 fl (ref 78.0–100.0)
Monocytes Absolute: 0.5 10*3/uL (ref 0.1–1.0)
Monocytes Relative: 7.1 % (ref 3.0–12.0)
Neutro Abs: 5.1 10*3/uL (ref 1.4–7.7)
Neutrophils Relative %: 67.7 % (ref 43.0–77.0)
Platelets: 315 10*3/uL (ref 150.0–400.0)
RBC: 4.48 Mil/uL (ref 3.87–5.11)
RDW: 13.7 % (ref 11.5–15.5)
WBC: 7.6 10*3/uL (ref 4.0–10.5)

## 2022-04-17 LAB — AMYLASE: Amylase: 37 U/L (ref 27–131)

## 2022-04-17 MED ORDER — PROMETHAZINE HCL 25 MG PO TABS: 25.0000 mg | ORAL_TABLET | Freq: Three times a day (TID) | ORAL | 0 refills | Status: DC | PRN

## 2022-04-17 NOTE — Progress Notes (Signed)
TeleHealth Visit:  This visit was completed with telemedicine (audio/video) technology. Anne Lewis has verbally consented to this TeleHealth visit. The patient is located at home, the provider is located at home. The participants in this visit include the listed provider and patient. The visit was conducted today via MyChart video.  OBESITY Anne Lewis is here to discuss her progress with her obesity treatment plan along with follow-up of her obesity related diagnoses.   Today's visit was # 10 Starting weight: 207 lbs Starting date: 11/07/2021 Weight at last in office visit: 203 lbs on 04/02/22 Total weight loss: 4 lbs at last in office visit on 04/02/22. Today's reported weight: 202 lbs at PCP visit yesterday.  Nutrition Plan: the Category 2 Plan and practicing portion control and making smarter food choices, such as increasing vegetables and decreasing simple carbohydrates.   Current exercise: none  Interim History: Anne Lewis has not eaten anything except a bite of chicken and rice and some crackers for the past week due to nausea and vomiting.  She is trying to stay hydrated but notes she has also been having some dehydration at times.  Assessment/Plan:  1. Nausea and vomiting in adult Saw PCP yesterday for evaluation of nausea and vomiting.  No diarrhea.  Reports less frequent urination.  Referred to GI.  Prescribed dissolvable Zofran.  Some lab work done.  Plan: GI office if she has not heard not heard from them in a few days to check on referral. Take frequent small sips of Pedialyte. Bland foods if tolerated.   New prescription for Phenergan 25 mg every 8 hours as needed for nausea and vomiting.  Advised it will make her drowsy.  2. Prediabetes Last A1c elevated at 5.7 on 11/07/2021. Medication(s): Metformin XR 500 mg daily (on hold currently due to nausea and vomiting) Lab Results  Component Value Date   HGBA1C 5.7 (H) 11/07/2021   Lab Results  Component Value Date    INSULIN 19.9 11/07/2021    Plan: Continue to hold metformin. Work on staying hydrated. Bland foods as tolerated.   3. Obesity: Current BMI 30.9 Anne Lewis is not currently in the action stage of change. As such, her goal is to maintain weight for now.  She has agreed to practicing portion control and making smarter food choices, such as increasing vegetables and decreasing simple carbohydrates.   Exercise goals: No exercise has been prescribed at this time.  Behavioral modification strategies: increasing lean protein intake, decreasing simple carbohydrates, and increasing water intake.  Anne Lewis has agreed to follow-up with our clinic in 2 weeks.   No orders of the defined types were placed in this encounter.   There are no discontinued medications.   Meds ordered this encounter  Medications   promethazine (PHENERGAN) 25 MG tablet    Sig: Take 1 tablet (25 mg total) by mouth every 8 (eight) hours as needed for nausea or vomiting.    Dispense:  30 tablet    Refill:  0    Order Specific Question:   Supervising Provider    Answer:   Dell Ponto [[id number]]      Objective:   VITALS: Per patient if applicable, see vitals. GENERAL: Alert and in no acute distress. CARDIOPULMONARY: No increased WOB. Speaking in clear sentences.  PSYCH: Pleasant and cooperative. Speech normal rate and rhythm. Affect is appropriate. Insight and judgement are appropriate. Attention is focused, linear, and appropriate.  NEURO: Oriented as arrived to appointment on time with no prompting.   Lab Results  Component  Value Date   CREATININE 1.04 04/16/2022   BUN 23 04/16/2022   NA 135 04/16/2022   K 4.4 04/16/2022   CL 101 04/16/2022   CO2 27 04/16/2022   Lab Results  Component Value Date   ALT 53 (H) 04/16/2022   AST 31 04/16/2022   ALKPHOS 86 04/16/2022   BILITOT 0.5 04/16/2022   Lab Results  Component Value Date   HGBA1C 5.7 (H) 11/07/2021   HGBA1C 5.9 06/18/2021   HGBA1C 5.9 08/30/2019    Lab Results  Component Value Date   INSULIN 19.9 11/07/2021   Lab Results  Component Value Date   TSH 0.537 11/07/2021   Lab Results  Component Value Date   CHOL 221 (H) 11/07/2021   HDL 44 11/07/2021   LDLCALC 154 (H) 11/07/2021   LDLDIRECT 156.0 06/18/2021   TRIG 125 11/07/2021   CHOLHDL 5.0 (H) 11/07/2021   Lab Results  Component Value Date   WBC 7.6 04/16/2022   HGB 13.5 04/16/2022   HCT 40.3 04/16/2022   MCV 89.9 04/16/2022   PLT 315.0 04/16/2022   No results found for: "IRON", "TIBC", "FERRITIN" Lab Results  Component Value Date   VD25OH 24.2 (L) 11/07/2021   VD25OH 12.59 (L) 06/18/2021   VD25OH 11.20 (L) 08/30/2019    Attestation Statements:   Reviewed by clinician on day of visit: allergies, medications, problem list, medical history, surgical history, family history, social history, and previous encounter notes.

## 2022-04-18 ENCOUNTER — Encounter (INDEPENDENT_AMBULATORY_CARE_PROVIDER_SITE_OTHER): Payer: Self-pay | Admitting: Family Medicine

## 2022-04-18 ENCOUNTER — Telehealth: Payer: Self-pay | Admitting: Family Medicine

## 2022-04-18 NOTE — Telephone Encounter (Signed)
Pt want you to explain her lab results

## 2022-04-19 NOTE — Telephone Encounter (Signed)
Returned patients call no answer sent message via Mychart with results and responses from Provider.

## 2022-04-29 ENCOUNTER — Telehealth (INDEPENDENT_AMBULATORY_CARE_PROVIDER_SITE_OTHER): Payer: Commercial Managed Care - HMO | Admitting: Psychology

## 2022-04-29 DIAGNOSIS — F509 Eating disorder, unspecified: Secondary | ICD-10-CM

## 2022-04-29 DIAGNOSIS — F4323 Adjustment disorder with mixed anxiety and depressed mood: Secondary | ICD-10-CM

## 2022-04-29 NOTE — Progress Notes (Signed)
  Office: [phone]  /  Fax: [phone]    Date: April 29, 2022    Appointment Start Time: 8:30am Duration: 28 minutes Provider: Glennie Isle, Psy.D. Type of Session: Individual Therapy  Location of Patient: Work (private location) Location of Provider: Provider's Home (private office) Type of Contact: Telepsychological Visit via MyChart Video Visit  Session Content: Anne Lewis is a 56 y.o. female presenting for a follow-up appointment to address the previously established treatment goal of increasing coping skills.Today's appointment was a telepsychological visit. Anne Lewis provided verbal consent for today's telepsychological appointment and she is aware she is responsible for securing confidentiality on her end of the session. Prior to proceeding with today's appointment, Anne Lewis's physical location at the time of this appointment was obtained as well a phone number she could be reached at in the event of technical difficulties. Anne Lewis and this provider participated in today's telepsychological service.   This provider conducted a brief check-in. Anne Lewis shared about her physical health, noting she will be meeting with a GI specialist. She stated she began eating solid foods last Thursday and described only being able to eat small portions. Anne Lewis denied engagement in emotional eating behaviors despite recent medical concerns. She also discussed she continues to "measure" and portion control everything she is eating. Reflected on progress to date. She noted, "You helped me start loving myself a little more. I'm watching what I put in my mouth." Anne Lewis of today's appointment focused further on coping with emotional eating behaviors should they arise in the future. Anne Lewis was engaged in problem solving to develop a plan to help cope with urges/cravings involving activities to relax, activities to distract, comforting places, people to call and connect with, and activities that help  soothe senses. She was observed writing the plan. Overall, Anne Lewis was receptive to today's appointment as evidenced by openness to sharing, responsiveness to feedback, and willingness to implement discussed strategies .  Mental Status Examination:  Appearance: neat Behavior: appropriate to circumstances Mood: sad Affect: mood congruent Speech: WNL Eye Contact: appropriate Psychomotor Activity: WNL Gait: unable to assess Thought Process: linear, logical, and goal directed and no evidence or endorsement of suicidal, homicidal, and self-harm ideation, plan and intent  Thought Content/Perception: no hallucinations, delusions, bizarre thinking or behavior endorsed or observed Orientation: AAOx4 Memory/Concentration: memory, attention, language, and fund of knowledge intact  Insight: fair Judgment: fair  Interventions:  Conducted a brief chart review Provided empathic reflections and validation Provided positive reinforcement Employed supportive psychotherapy interventions to facilitate reduced distress and to improve coping skills with identified stressors Engaged patient in problem solving Reviewed learned skills Discussed termination   DSM-5 Diagnosis(es):  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 Anne Lewis demonstrated progress in her goal as evidenced by increased awareness of hunger patterns, increased awareness of triggers for emotional eating behaviors, increased awareness of portion control and food choices, and reduction in emotional eating behaviors. Anne Lewis also continues to demonstrate willingness to engage in learned skill(s).  Plan: Given recent medical concerns and ongoing medical appointments, today was Anne Lewis's last appointment with this provider. She  will continue meeting with her primary therapist. She acknowledged understanding that she may request a follow-up appointment with this provider in the future as long as she is still established with the clinic. No further follow-up planned by this provider.

## 2022-04-30 ENCOUNTER — Ambulatory Visit (INDEPENDENT_AMBULATORY_CARE_PROVIDER_SITE_OTHER): Payer: Commercial Managed Care - HMO | Admitting: Family Medicine

## 2022-04-30 ENCOUNTER — Encounter (INDEPENDENT_AMBULATORY_CARE_PROVIDER_SITE_OTHER): Payer: Self-pay | Admitting: Family Medicine

## 2022-04-30 VITALS — BP 134/77 | HR 86 | Temp 98.1°F | Ht 68.0 in | Wt 196.0 lb

## 2022-04-30 DIAGNOSIS — Z6829 Body mass index (BMI) 29.0-29.9, adult: Secondary | ICD-10-CM

## 2022-04-30 DIAGNOSIS — E669 Obesity, unspecified: Secondary | ICD-10-CM | POA: Diagnosis not present

## 2022-04-30 DIAGNOSIS — R112 Nausea with vomiting, unspecified: Secondary | ICD-10-CM | POA: Insufficient documentation

## 2022-04-30 DIAGNOSIS — F32A Depression, unspecified: Secondary | ICD-10-CM | POA: Insufficient documentation

## 2022-04-30 DIAGNOSIS — E559 Vitamin D deficiency, unspecified: Secondary | ICD-10-CM

## 2022-04-30 DIAGNOSIS — F3289 Other specified depressive episodes: Secondary | ICD-10-CM | POA: Diagnosis not present

## 2022-05-01 LAB — VITAMIN D 25 HYDROXY (VIT D DEFICIENCY, FRACTURES): Vit D, 25-Hydroxy: 69 ng/mL (ref 30.0–100.0)

## 2022-05-02 ENCOUNTER — Ambulatory Visit: Payer: Commercial Managed Care - HMO | Admitting: Behavioral Health

## 2022-05-06 ENCOUNTER — Ambulatory Visit: Payer: Commercial Managed Care - HMO | Admitting: Behavioral Health

## 2022-05-07 NOTE — Progress Notes (Signed)
Chief Complaint:   OBESITY Anne Lewis is here to discuss her progress with her obesity treatment plan along with follow-up of her obesity related diagnoses. Anne Lewis is on practicing portion control and making smarter food choices, such as increasing vegetables and decreasing simple carbohydrates and states she is following her eating plan approximately 80% of the time. Anne Lewis states she is doing cardio for 45-60 minutes 6 times per week.  Today's visit was #: 11 Starting weight: 207 lbs Starting date: 10/28/2021 Today's weight: 196 lbs Today's date: 04/30/2022 Total lbs lost to date: 11 Total lbs lost since last in-office visit: 7  Interim History: Anne Lewis is still having nausea, but less vomiting.  She is taking Zofran and Phenergan, and ginger pills.  She is taking MiraLAX for constipation then she had diarrhea episodes, but this has resolved.  Last emesis since Sunday after eating chicken and vegetables.  She is getting undigested food back.  She started some [place] and ground beef but she feels like she is burping consistently.  She tolerates some crackers.  Subjective:   1. Vitamin D deficiency Anne Lewis is taking vitamin D prescription 50,000 units once weekly, and she notes persistent nausea and vomiting.  She reports some generalized muscle pain.  2. Nausea and vomiting, unspecified vomiting type Anne Lewis is taking Zofran, Phenergan, ginger pills, and omeprazole.  3. Other depression/emotional eating Anne Lewis is taking Wellbutrin, and she reports no side effects.  She feels Wellbutrin is helping with depression, emotional eating behaviors, and cravings.  Assessment/Plan:   1. Vitamin D deficiency We will check labs today.  We will not refill prescription vitamin D for now pending Anne Lewis's results.   - VITAMIN D 25 Hydroxy (Vit-D Deficiency, Fractures)  2. Nausea and vomiting, unspecified vomiting type Anne Lewis was referred back to GI, Dr. Ardis Hughs who has seen her for  many years.  - Ambulatory referral to Gastroenterology  3. Other depression/emotional eating Anne Lewis will continue Wellbutrin as is.   4. Obesity, current BMI 29.8 Anne Lewis is currently in the action stage of change. As such, her goal is to continue with weight loss efforts. She has agreed to practicing portion control and making smarter food choices, such as increasing vegetables and decreasing simple carbohydrates.   Patient was advised to get in liquids to maintain hydration and get back to diet as tolerated.   Exercise goals: As is.   Behavioral modification strategies: increasing lean protein intake and increasing water intake.  Anne Lewis has agreed to follow-up with our clinic in 4 weeks. She was informed of the importance of frequent follow-up visits to maximize her success with intensive lifestyle modifications for her multiple health conditions.   Objective:   Blood pressure 134/77, pulse 86, temperature 98.1 F (36.7 C), height '5\' 8"'$  (1.727 m), weight 196 lb (88.9 kg), last menstrual period 03/14/2018, SpO2 100 %. Body mass index is 29.8 kg/m.  General: Cooperative, alert, well developed, in no acute distress. HEENT: Conjunctivae and lids unremarkable. Cardiovascular: Regular rhythm.  Lungs: Normal work of breathing. Neurologic: No focal deficits.   Lab Results  Component Value Date   CREATININE 1.04 04/16/2022   BUN 23 04/16/2022   NA 135 04/16/2022   K 4.4 04/16/2022   CL 101 04/16/2022   CO2 27 04/16/2022   Lab Results  Component Value Date   ALT 53 (H) 04/16/2022   AST 31 04/16/2022   ALKPHOS 86 04/16/2022   BILITOT 0.5 04/16/2022   Lab Results  Component Value Date   HGBA1C  5.7 (H) 11/07/2021   HGBA1C 5.9 06/18/2021   HGBA1C 5.9 08/30/2019   Lab Results  Component Value Date   INSULIN 19.9 11/07/2021   Lab Results  Component Value Date   TSH 0.537 11/07/2021   Lab Results  Component Value Date   CHOL 221 (H) 11/07/2021   HDL 44 11/07/2021    LDLCALC 154 (H) 11/07/2021   LDLDIRECT 156.0 06/18/2021   TRIG 125 11/07/2021   CHOLHDL 5.0 (H) 11/07/2021   Lab Results  Component Value Date   VD25OH 69.0 04/30/2022   VD25OH 24.2 (L) 11/07/2021   VD25OH 12.59 (L) 06/18/2021   Lab Results  Component Value Date   WBC 7.6 04/16/2022   HGB 13.5 04/16/2022   HCT 40.3 04/16/2022   MCV 89.9 04/16/2022   PLT 315.0 04/16/2022   No results found for: "IRON", "TIBC", "FERRITIN"  Attestation Statements:   Reviewed by clinician on day of visit: allergies, medications, problem list, medical history, surgical history, family history, social history, and previous encounter notes.   I, Trixie Dredge, am acting as transcriptionist for Dennard Nip, MD.  I have reviewed the above documentation for accuracy and completeness, and I agree with the above. -  Dennard Nip, MD

## 2022-05-12 ENCOUNTER — Other Ambulatory Visit (INDEPENDENT_AMBULATORY_CARE_PROVIDER_SITE_OTHER): Payer: Self-pay | Admitting: Family Medicine

## 2022-05-12 DIAGNOSIS — F3289 Other specified depressive episodes: Secondary | ICD-10-CM

## 2022-05-13 NOTE — Progress Notes (Unsigned)
TeleHealth Visit:  This visit was completed with telemedicine (audio/video) technology. Mariaisabel has verbally consented to this TeleHealth visit. The patient is located at home, the provider is located at home. The participants in this visit include the listed provider and patient. The visit was conducted today via MyChart video.  OBESITY Anne Lewis is here to discuss her progress with her obesity treatment plan along with follow-up of her obesity related diagnoses.   Today's visit was # 12 Starting weight: 207 lbs Starting date: 10/28/2021 Weight at last in office visit: 196 lbs on 04/30/22 Total weight loss: 11 lbs at last in office visit on 04/30/22. Today's reported weight: 191 lbs  last week  Nutrition Plan: practicing portion control and making smarter food choices, such as increasing vegetables and decreasing simple carbohydrates.   Current exercise:  cardio for 45-60 minutes 5 times per week.  Interim History: Anayansi has been suffering from nausea and vomiting for the past 2 months.  Her weight last week was 191 pounds-down 14 pounds since 03/19/2022.  She is trying to get in protein and seems to tolerate that fairly well.  She does not tolerate raw vegetables or large amount of liquids.  Does not tolerate Gatorade well.  More than 3-4 saltines causes nausea.  She also has a feeling of something stuck in her throat.  Notes pain in area where she had her appendectomy this past February. She stopped drinking coffee because it caused nausea.  She sometimes awakens in the middle the night with nausea and has to vomit.  Assessment/Plan:  1. GERD Controlled with omeprazole 20 mg daily.  Has had some instances of GERD over the past few months along with her nausea.  Plan: Refill omeprazole 20 mg daily.  May take OTC famotidine with omeprazole if needed.  2.  Other depression/emotional eating Currently this is well controlled largely duet o her constant nausea.  Overall mood is  stable. Denies suicidal/homicidal ideation.  Despite feeling so bad over the few months she continues to exercise and notes energy level is good. Currently taking bupropion 200 mg daily and escitalopram 10 mg daily.  Plan: Refill bupropion 200 mg daily. Continue escitalopram 10 mg daily.  3. Vitamin D Deficiency Vitamin D is at goal of 50.  Vitamin D is now 23 on 04/30/2022, up from 24 on 11/07/2021. She is on  prescription Vitamin D 50,000 IU every 3 days. Lab Results  Component Value Date   VD25OH 69.0 04/30/2022   VD25OH 24.2 (L) 11/07/2021   VD25OH 12.59 (L) 06/18/2021    Plan: Decrease dose and refill vitamin D 50,000 IU weekly.   4.  Nausea vomiting in adult  Reports constant nausea for past 2 months along with right lower quadrant pain and intermittent vomiting.  Had appendectomy in February 2023.  Fluid intake is good.  Many foods trigger vomiting.  She is able to tolerate chicken.  Tried a salad yesterday and vomited. The Phenergan and Zofran help but do not totally alleviate the nausea. Has appointment with gastroenterology on 06/03/2022.  Plan: Follow-up with gastroenterology on 06/03/2022. Refill:  promethazine (PHENERGAN) 25 MG tablet    Sig: Take 1 tablet (25 mg total) by mouth every 8 (eight) hours as needed for nausea or vomiting.    Dispense:  30 tablet    Refill:  0    Order Specific Question:   Supervising Provider    Answer:   Loyal Gambler E [[id number]]   ondansetron (ZOFRAN-ODT) 4 MG disintegrating tablet  Sig: Take 1 tablet (4 mg total) by mouth every 8 (eight) hours as needed for nausea or vomiting.    Dispense:  20 tablet    Refill:  1    Order Specific Question:   Supervising Provider    Answer:   Loyal Gambler E [[id number]]      5. Obesity: Current BMI 29.8 She is not actively trying to lose weight currently but is losing weight due to nausea and vomiting.   She has agreed to practicing portion control and making smarter food choices, such as increasing  vegetables and decreasing simple carbohydrates.   1. Will start bland diet. 2. Encouraged protein intake. 3. Criss Rosales diet information sent via [place]. 4. Try watered-down Gatorade or Powerade. 5.  Drink liquids in small sips.   Exercise goals: as is  Behavioral modification strategies: increasing lean protein intake and planning for success.  Hula has agreed to follow-up with our clinic in 2 weeks.   No orders of the defined types were placed in this encounter.   Medications Discontinued During This Encounter  Medication Reason   buPROPion (WELLBUTRIN SR) 200 MG 12 hr tablet Reorder   Vitamin D, Ergocalciferol, (DRISDOL) 1.25 MG (50000 UNIT) CAPS capsule Reorder   ondansetron (ZOFRAN-ODT) 4 MG disintegrating tablet Reorder   omeprazole (PRILOSEC) 20 MG capsule Reorder   promethazine (PHENERGAN) 25 MG tablet Reorder     Meds ordered this encounter  Medications   omeprazole (PRILOSEC) 20 MG capsule    Sig: Take 1 capsule (20 mg total) by mouth daily.    Dispense:  30 capsule    Refill:  0    Order Specific Question:   Supervising Provider    Answer:   Dell Ponto [[id number]]   buPROPion (WELLBUTRIN SR) 200 MG 12 hr tablet    Sig: Take 1 tablet (200 mg total) by mouth in the morning.    Dispense:  30 tablet    Refill:  0    Order Specific Question:   Supervising Provider    Answer:   Loyal Gambler E [[id number]]   Vitamin D, Ergocalciferol, (DRISDOL) 1.25 MG (50000 UNIT) CAPS capsule    Sig: Take 1 capsule (50,000 Units total) by mouth every 7 (seven) days.    Dispense:  12 capsule    Refill:  0    Order Specific Question:   Supervising Provider    Answer:   Dell Ponto [[id number]]   promethazine (PHENERGAN) 25 MG tablet    Sig: Take 1 tablet (25 mg total) by mouth every 8 (eight) hours as needed for nausea or vomiting.    Dispense:  30 tablet    Refill:  0    Order Specific Question:   Supervising Provider    Answer:   Loyal Gambler E [[id number]]   ondansetron (ZOFRAN-ODT) 4 MG  disintegrating tablet    Sig: Take 1 tablet (4 mg total) by mouth every 8 (eight) hours as needed for nausea or vomiting.    Dispense:  20 tablet    Refill:  1    Order Specific Question:   Supervising Provider    Answer:   Dell Ponto [[id number]]      Objective:   VITALS: Per patient if applicable, see vitals. GENERAL: Alert and in no acute distress. CARDIOPULMONARY: No increased WOB. Speaking in clear sentences.  PSYCH: Pleasant and cooperative. Speech normal rate and rhythm. Affect is appropriate. Insight and judgement are appropriate. Attention is focused, linear, and appropriate.  NEURO:  Oriented as arrived to appointment on time with no prompting.   Lab Results  Component Value Date   CREATININE 1.04 04/16/2022   BUN 23 04/16/2022   NA 135 04/16/2022   K 4.4 04/16/2022   CL 101 04/16/2022   CO2 27 04/16/2022   Lab Results  Component Value Date   ALT 53 (H) 04/16/2022   AST 31 04/16/2022   ALKPHOS 86 04/16/2022   BILITOT 0.5 04/16/2022   Lab Results  Component Value Date   HGBA1C 5.7 (H) 11/07/2021   HGBA1C 5.9 06/18/2021   HGBA1C 5.9 08/30/2019   Lab Results  Component Value Date   INSULIN 19.9 11/07/2021   Lab Results  Component Value Date   TSH 0.537 11/07/2021   Lab Results  Component Value Date   CHOL 221 (H) 11/07/2021   HDL 44 11/07/2021   LDLCALC 154 (H) 11/07/2021   LDLDIRECT 156.0 06/18/2021   TRIG 125 11/07/2021   CHOLHDL 5.0 (H) 11/07/2021   Lab Results  Component Value Date   WBC 7.6 04/16/2022   HGB 13.5 04/16/2022   HCT 40.3 04/16/2022   MCV 89.9 04/16/2022   PLT 315.0 04/16/2022   No results found for: "IRON", "TIBC", "FERRITIN" Lab Results  Component Value Date   VD25OH 69.0 04/30/2022   VD25OH 24.2 (L) 11/07/2021   VD25OH 12.59 (L) 06/18/2021    Attestation Statements:   Reviewed by clinician on day of visit: allergies, medications, problem list, medical history, surgical history, family history, social history, and  previous encounter notes.

## 2022-05-15 ENCOUNTER — Encounter (INDEPENDENT_AMBULATORY_CARE_PROVIDER_SITE_OTHER): Payer: Self-pay | Admitting: Family Medicine

## 2022-05-15 ENCOUNTER — Telehealth (INDEPENDENT_AMBULATORY_CARE_PROVIDER_SITE_OTHER): Payer: Commercial Managed Care - HMO | Admitting: Family Medicine

## 2022-05-15 DIAGNOSIS — F3289 Other specified depressive episodes: Secondary | ICD-10-CM

## 2022-05-15 DIAGNOSIS — K2100 Gastro-esophageal reflux disease with esophagitis, without bleeding: Secondary | ICD-10-CM

## 2022-05-15 DIAGNOSIS — E559 Vitamin D deficiency, unspecified: Secondary | ICD-10-CM | POA: Diagnosis not present

## 2022-05-15 DIAGNOSIS — Z6829 Body mass index (BMI) 29.0-29.9, adult: Secondary | ICD-10-CM

## 2022-05-15 DIAGNOSIS — R112 Nausea with vomiting, unspecified: Secondary | ICD-10-CM | POA: Diagnosis not present

## 2022-05-15 DIAGNOSIS — E669 Obesity, unspecified: Secondary | ICD-10-CM

## 2022-05-15 MED ORDER — VITAMIN D (ERGOCALCIFEROL) 1.25 MG (50000 UNIT) PO CAPS: 50000.0000 [IU] | ORAL_CAPSULE | ORAL | 0 refills | Status: DC

## 2022-05-15 MED ORDER — ONDANSETRON 4 MG PO TBDP: 4.0000 mg | ORAL_TABLET | Freq: Three times a day (TID) | ORAL | 1 refills | Status: DC | PRN

## 2022-05-15 MED ORDER — BUPROPION HCL ER (SR) 200 MG PO TB12: 200.0000 mg | ORAL_TABLET | Freq: Every morning | ORAL | 0 refills | Status: DC

## 2022-05-15 MED ORDER — OMEPRAZOLE 20 MG PO CPDR: 20.0000 mg | DELAYED_RELEASE_CAPSULE | Freq: Every day | ORAL | 0 refills | Status: DC

## 2022-05-15 MED ORDER — PROMETHAZINE HCL 25 MG PO TABS: 25.0000 mg | ORAL_TABLET | Freq: Three times a day (TID) | ORAL | 0 refills | Status: DC | PRN

## 2022-05-16 ENCOUNTER — Ambulatory Visit: Payer: Commercial Managed Care - HMO | Admitting: Behavioral Health

## 2022-05-16 ENCOUNTER — Other Ambulatory Visit (INDEPENDENT_AMBULATORY_CARE_PROVIDER_SITE_OTHER): Payer: Self-pay | Admitting: Family Medicine

## 2022-05-16 DIAGNOSIS — F3289 Other specified depressive episodes: Secondary | ICD-10-CM

## 2022-05-28 ENCOUNTER — Encounter (INDEPENDENT_AMBULATORY_CARE_PROVIDER_SITE_OTHER): Payer: Self-pay | Admitting: Family Medicine

## 2022-05-28 ENCOUNTER — Ambulatory Visit (INDEPENDENT_AMBULATORY_CARE_PROVIDER_SITE_OTHER): Payer: Commercial Managed Care - HMO | Admitting: Family Medicine

## 2022-05-28 VITALS — BP 119/74 | HR 77 | Temp 98.1°F | Ht 68.0 in | Wt 190.0 lb

## 2022-05-28 DIAGNOSIS — F3289 Other specified depressive episodes: Secondary | ICD-10-CM | POA: Diagnosis not present

## 2022-05-28 DIAGNOSIS — E559 Vitamin D deficiency, unspecified: Secondary | ICD-10-CM

## 2022-05-28 DIAGNOSIS — K2100 Gastro-esophageal reflux disease with esophagitis, without bleeding: Secondary | ICD-10-CM

## 2022-05-28 DIAGNOSIS — R112 Nausea with vomiting, unspecified: Secondary | ICD-10-CM

## 2022-05-28 DIAGNOSIS — Z6828 Body mass index (BMI) 28.0-28.9, adult: Secondary | ICD-10-CM

## 2022-05-28 DIAGNOSIS — E669 Obesity, unspecified: Secondary | ICD-10-CM

## 2022-05-28 MED ORDER — VITAMIN D (ERGOCALCIFEROL) 1.25 MG (50000 UNIT) PO CAPS: 50000.0000 [IU] | ORAL_CAPSULE | ORAL | 0 refills | Status: DC

## 2022-05-28 MED ORDER — BUPROPION HCL ER (SR) 200 MG PO TB12: 200.0000 mg | ORAL_TABLET | Freq: Every morning | ORAL | 0 refills | Status: DC

## 2022-05-28 MED ORDER — ESCITALOPRAM OXALATE 10 MG PO TABS: 10.0000 mg | ORAL_TABLET | Freq: Every day | ORAL | 0 refills | Status: DC

## 2022-05-28 MED ORDER — OMEPRAZOLE 20 MG PO CPDR: 20.0000 mg | DELAYED_RELEASE_CAPSULE | Freq: Every day | ORAL | 0 refills | Status: DC

## 2022-06-03 ENCOUNTER — Encounter: Payer: Self-pay | Admitting: Physician Assistant

## 2022-06-03 ENCOUNTER — Ambulatory Visit: Payer: Commercial Managed Care - HMO | Admitting: Physician Assistant

## 2022-06-03 VITALS — BP 122/78 | HR 98 | Ht 69.0 in | Wt 194.8 lb

## 2022-06-03 DIAGNOSIS — R112 Nausea with vomiting, unspecified: Secondary | ICD-10-CM

## 2022-06-03 DIAGNOSIS — R1084 Generalized abdominal pain: Secondary | ICD-10-CM | POA: Diagnosis not present

## 2022-06-03 MED ORDER — OMEPRAZOLE 40 MG PO CPDR: 40.0000 mg | DELAYED_RELEASE_CAPSULE | Freq: Two times a day (BID) | ORAL | 3 refills | Status: DC

## 2022-06-03 NOTE — Patient Instructions (Signed)
We have sent the following medications to your pharmacy for you to pick up at your convenience: Omeprazole 40 mg twice daily 30-60 minutes before breakfast and dinner.  You have been scheduled for an endoscopy. Please follow written instructions given to you at your visit today. If you use inhalers (even only as needed), please bring them with you on the day of your procedure.  _______________________________________________________  If you are age 56 or older, your body mass index should be between 23-30. Your Body mass index is 28.77 kg/m. If this is out of the aforementioned range listed, please consider follow up with your Primary Care Provider.  If you are age 45 or younger, your body mass index should be between 19-25. Your Body mass index is 28.77 kg/m. If this is out of the aformentioned range listed, please consider follow up with your Primary Care Provider.   ________________________________________________________  The [facility] providers would like to encourage you to use [facility] to communicate with providers for non-urgent requests or questions.  Due to long hold times on the telephone, sending your provider a message by [facility] may be a faster and more efficient way to get a response.  Please allow 48 business hours for a response.  Please remember that this is for non-urgent requests.  _______________________________________________________

## 2022-06-03 NOTE — Progress Notes (Signed)
Chief Complaint: Nausea and vomiting  Review of gastrointestinal problems: 1. Gastritis, H. pylori positive on EGD biopsies May 2011. Put on appropriate antibiotics 2. routine risk for colon cancer, colonoscopy May 2011 found no polyps, recall colonoscopy at 10 year interval.      HPI:    [name] is a 56 year old female with a past medical history as listed below including anxiety, heartburn, prior gastric ulcer and multiple others, known to Dr. Ardis Hughs, who presents to clinic today with a complaint of nausea and vomiting.    09/03/2021 laparoscopic appendectomy.    10/15/2021 patient seen in clinic by Dr. Ardis Hughs.  At that time discussed that her sister was diagnosed with colon cancer.  That time was because she had felt no better since surgery and probably worse.  She had upper abdominal discomfort and a HIDA scan which showed patent cystic and common bile ducts normal gallbladder ejection fraction.  She was taking Carafate 4 times daily which is not helping.  That time noted she gained 80 pounds including 8 pounds in the past month or so over the past year.  At that time Dr. Ardis Hughs noted unclear etiology but was struck by the fact that she was fairly tender on exam throughout her abdomen.  Another CT scan was ordered with contrast and repeat labs.  Also H. pylori antigen testing.  She was given hyoscyamine.  Discussed possible EGD and colonoscopy.    10/23/2021 CTAP with contrast was negative.    H. pylori stool antigen was never returned.    04/16/2022 CBC with differential was normal.  Hepatic function panel with minimally elevated ALT at 53.  Amylase normal.    05/28/2022 patient saw her PCP for reflux    Today, patient presents to clinic and tells me that since February when she got her appendix out her whole GI system has been weird and she will radiate back and forth from constipation to diarrhea and has been constantly nauseous.  In May/June timeframe this got worse and she was using  Zofran or Phenergan or Ginger pills in order to abate the symptoms.  Tells me very occasionally they will get so severe that she will just eat something that triggers it and will have to run and vomit.  She often vomits "toilet fulls of liquid", she is not exactly sure where the liquid comes from.  Apparently her PCP held her Metformin for 18 days which did not help.  Tells me that she went through about a month timeframe where she was vomiting every day and got dehydrated and ended up in the ER for fluids.  Over the past 3 weeks she has not vomited as much but is still constantly nauseous.  Tells me that certain things trigger her symptoms including sweets, coffee and others.  Tells me she is not sure what is going on.  Describes history of a stomach ulcer.  Currently on Omeprazole 20 mg daily.    Also reminds me of her family history of colon cancer and need for colonoscopy.    Denies fever, chills, blood in her stool or symptoms that awaken her from sleep.  Past Medical History:  Diagnosis Date   anemia    Anxiety    Aortic atherosclerosis (HCC)    B12 deficiency    Back pain    BRCA negative 11/2013   Chest pain    Constipation    Fatty liver    Gallbladder problem    H pylori ulcer  Heartburn    Joint pain    Migraines    Palpitation    sleep apnea    Stomach ulcer    Ulcer of gastric fundus    Vitamin D deficiency     Past Surgical History:  Procedure Laterality Date   CESAREAN SECTION  3016,0109   ENDOMETRIAL CRYOABLATION  12.19.2012   HER OPTION - office performed   LAPAROSCOPIC APPENDECTOMY N/A 09/03/2021   Procedure: APPENDECTOMY LAPAROSCOPIC;  Surgeon: Ralene Ok, MD;  Location: [facility];  Service: General;  Laterality: N/A;   TONSILLECTOMY  1987    Current Outpatient Medications  Medication Sig Dispense Refill   buPROPion (WELLBUTRIN SR) 200 MG 12 hr tablet Take 1 tablet (200 mg total) by mouth in the morning. 30 tablet 0   escitalopram (LEXAPRO) 10 MG tablet  Take 1 tablet (10 mg total) by mouth daily. 30 tablet 0   hyoscyamine (LEVSIN SL) 0.125 MG SL tablet Place 1 tablet (0.125 mg total) under the tongue every 6 (six) hours as needed for cramping. 30 tablet 6   metFORMIN (GLUCOPHAGE-XR) 500 MG 24 hr tablet Take 500 mg by mouth daily. (Patient not taking: Reported on 04/16/2022)     naproxen sodium (ALEVE) 220 MG tablet Take 440 mg by mouth daily as needed.     omeprazole (PRILOSEC) 20 MG capsule Take 1 capsule (20 mg total) by mouth daily. 30 capsule 0   ondansetron (ZOFRAN-ODT) 4 MG disintegrating tablet Take 1 tablet (4 mg total) by mouth every 8 (eight) hours as needed for nausea or vomiting. 20 tablet 1   promethazine (PHENERGAN) 25 MG tablet Take 1 tablet (25 mg total) by mouth every 8 (eight) hours as needed for nausea or vomiting. 30 tablet 0   Vitamin D, Ergocalciferol, (DRISDOL) 1.25 MG (50000 UNIT) CAPS capsule Take 1 capsule (50,000 Units total) by mouth every 7 (seven) days. 12 capsule 0   ZOLMitriptan (ZOMIG) 2.5 MG tablet Take 1 tablet (2.5 mg total) by mouth once for 1 dose. May repeat in 2 hours if headache persists or recurs. 10 tablet 1   No current facility-administered medications for this visit.    Allergies as of 06/03/2022 - Review Complete 06/03/2022  Allergen Reaction Noted   Aspirin Swelling and Other (See Comments) 05/28/2011   Augmentin [amoxicillin-pot clavulanate] Swelling 01/03/2018   Oxycodone Nausea And Vomiting 01/03/2018    Family History  Problem Relation Age of Onset   Hypertension Mother    Cancer Mother 47       intestational and ovarian   Diverticulitis Mother    Depression Mother    Anxiety disorder Mother    Sleep apnea Mother    Stroke Father    Diabetes Father    Hypertension Father    Heart disease Father    Cancer Sister 43       ovarian and intestional   Cancer Brother 24       GI cancer   Cancer Maternal Grandmother 45       ovarian and pancreatic   Cancer Maternal Grandfather 82        pancreatic   Breast cancer Maternal Aunt 69   Cancer Paternal Aunt 73       stomach   Cancer Cousin 39       ovarian   Cancer Cousin 37       breast    Social History   Socioeconomic History   Marital status: Married    Spouse name: Film/video editor  Number of children: Not on file   Years of education: Not on file   Highest education level: Not on file  Occupational History   Occupation: Geneticist, molecular  Tobacco Use   Smoking status: Some Days    Types: Cigarettes   Smokeless tobacco: Never  Vaping Use   Vaping Use: Never used  Substance and Sexual Activity   Alcohol use: Yes    Comment: occ   Drug use: No   Sexual activity: Yes    Birth control/protection: Post-menopausal  Other Topics Concern   Not on file  Social History Narrative   Not on file   Social Determinants of Health   Financial Resource Strain: Not on file  Food Insecurity: Not on file  Transportation Needs: Not on file  Physical Activity: Not on file  Stress: Not on file  Social Connections: Not on file  Intimate Partner Violence: Not on file    Review of Systems:    Constitutional: No weight loss, fever or chills Cardiovascular: No chest pain Respiratory: No SOB Gastrointestinal: See HPI and otherwise negative   Physical Exam:  Vital signs: BP 122/78   Pulse 98   Ht _0  (1.753 m)   Wt 194 lb 12.8 oz (88.4 kg)   LMP 03/14/2018   SpO2 98%   BMI 28.77 kg/m   Constitutional:   Pleasant female appears to be in NAD, Well developed, Well nourished, alert and cooperative Respiratory: Respirations even and unlabored. Lungs clear to auscultation bilaterally.   No wheezes, crackles, or rhonchi.  Cardiovascular: Normal S1, S2. No MRG. Regular rate and rhythm. No peripheral edema, cyanosis or pallor.  Gastrointestinal:  Soft, nondistended, mild to moderate generalized TTP, no rebound or guarding. Normal bowel sounds. No appreciable masses or hepatomegaly. Rectal:  Not performed.   Psychiatric: Oriented to person, place and time. Demonstrates good judgement and reason without abnormal affect or behaviors.  RELEVANT LABS AND IMAGING: CBC    Component Value Date/Time   WBC 7.6 04/16/2022 1629   RBC 4.48 04/16/2022 1629   HGB 13.5 04/16/2022 1629   HGB 14.7 11/07/2021 1029   HCT 40.3 04/16/2022 1629   HCT 43.8 11/07/2021 1029   PLT 315.0 04/16/2022 1629   PLT 338 11/07/2021 1029   MCV 89.9 04/16/2022 1629   MCV 89 11/07/2021 1029   MCH 29.5 01/24/2022 1739   MCHC 33.5 04/16/2022 1629   RDW 13.7 04/16/2022 1629   RDW 12.6 11/07/2021 1029   LYMPHSABS 1.6 04/16/2022 1629   LYMPHSABS 2.0 11/07/2021 1029   MONOABS 0.5 04/16/2022 1629   EOSABS 0.2 04/16/2022 1629   EOSABS 0.3 11/07/2021 1029   BASOSABS 0.1 04/16/2022 1629   BASOSABS 0.1 11/07/2021 1029    CMP     Component Value Date/Time   NA 135 04/16/2022 1629   NA 141 11/07/2021 1029   K 4.4 04/16/2022 1629   CL 101 04/16/2022 1629   CO2 27 04/16/2022 1629   GLUCOSE 90 04/16/2022 1629   BUN 23 04/16/2022 1629   BUN 22 11/07/2021 1029   CREATININE 1.04 04/16/2022 1629   CREATININE 0.91 08/12/2016 0837   CALCIUM 9.8 04/16/2022 1629   PROT 8.0 04/16/2022 1629   PROT 7.6 11/07/2021 1029   ALBUMIN 4.6 04/16/2022 1629   ALBUMIN 5.0 (H) 11/07/2021 1029   AST 31 04/16/2022 1629   ALT 53 (H) 04/16/2022 1629   ALKPHOS 86 04/16/2022 1629   BILITOT 0.5 04/16/2022 1629   BILITOT <0.2 11/07/2021 1029   GFRNONAA >  60 01/24/2022 1739    Assessment: 1.  Nausea and vomiting: For the past 8 months, worse over the past couple of months, history of PUD/GERD which is likely contributing +/- H. pylori +/- gastroparesis 2.  Generalized abdominal pain: Recent CT imaging unrevealing, consider musculoskeletal given all of her vomiting recently versus other 3.  Family history of colon cancer: In her sister, patient is overdue for screening colonoscopy  Plan: 1.  Scheduled the patient for an urgent EGD with Dr.  Tarri Glenn.  Did provide the patient a detailed list of risks for the procedure and she agrees to proceed. Patient is appropriate for endoscopic procedure(s) in the ambulatory ([place]) setting.  2.  Increased Omeprazole to 40 mg twice daily, 30-60 minutes before breakfast and dinner #60 with 3 refills. 3.  Recommend scheduling out her Zofran 4 mg 1-2 tabs every 6 hours.  Patient has enough of this medication at home.  She can use Phenergan for breakthrough. 4.  Patient to follow in clinic per recommendations from Dr. Tarri Glenn after time of procedure.  She is due for colonoscopy but I do not think she could do the bowel prep at this point.  Hopefully we can do this at some point in the future.  Patient will continue to follow with Dr. Tarri Glenn in lieu of Dr. Ardis Hughs absence.  Ellouise Newer, PA-C [facility] 06/03/2022, 8:52 AM  Cc: Libby Maw

## 2022-06-04 ENCOUNTER — Ambulatory Visit (AMBULATORY_SURGERY_CENTER): Payer: Commercial Managed Care - HMO | Admitting: Gastroenterology

## 2022-06-04 ENCOUNTER — Telehealth: Payer: Self-pay

## 2022-06-04 ENCOUNTER — Encounter: Payer: Self-pay | Admitting: Gastroenterology

## 2022-06-04 VITALS — BP 137/81 | HR 87 | Temp 99.1°F | Resp 12 | Ht 69.0 in | Wt 194.0 lb

## 2022-06-04 DIAGNOSIS — R1084 Generalized abdominal pain: Secondary | ICD-10-CM

## 2022-06-04 DIAGNOSIS — K2950 Unspecified chronic gastritis without bleeding: Secondary | ICD-10-CM | POA: Diagnosis not present

## 2022-06-04 DIAGNOSIS — K219 Gastro-esophageal reflux disease without esophagitis: Secondary | ICD-10-CM | POA: Diagnosis not present

## 2022-06-04 MED ORDER — SODIUM CHLORIDE 0.9 % IV SOLN: 500.0000 mL | Freq: Once | INTRAVENOUS | Status: DC

## 2022-06-04 NOTE — Patient Instructions (Signed)
Resume previous diet. - Continue present medications. - Await pathology results. - Office follow-up to review these results  YOU HAD AN ENDOSCOPIC PROCEDURE TODAY: Refer to the procedure report and other information in the discharge instructions given to you for any specific questions about what was found during the examination. If this information does not answer your questions, please call [facility] office at [phone] to clarify.   YOU SHOULD EXPECT: Some feelings of bloating in the abdomen. Passage of more gas than usual. Walking can help get rid of the air that was put into your GI tract during the procedure and reduce the bloating. If you had a lower endoscopy (such as a colonoscopy or flexible sigmoidoscopy) you may notice spotting of blood in your stool or on the toilet paper. Some abdominal soreness may be present for a day or two, also.  DIET: Your first meal following the procedure should be a light meal and then it is ok to progress to your normal diet. A half-sandwich or bowl of soup is an example of a good first meal. Heavy or fried foods are harder to digest and may make you feel nauseous or bloated. Drink plenty of fluids but you should avoid alcoholic beverages for 24 hours. If you had a esophageal dilation, please see attached instructions for diet.    ACTIVITY: Your care partner should take you home directly after the procedure. You should plan to take it easy, moving slowly for the rest of the day. You can resume normal activity the day after the procedure however YOU SHOULD NOT DRIVE, use power tools, machinery or perform tasks that involve climbing or major physical exertion for 24 hours (because of the sedation medicines used during the test).   SYMPTOMS TO REPORT IMMEDIATELY: A gastroenterologist can be reached at any hour. Please call [phone]  for any of the following symptoms:  Following upper endoscopy (EGD, EUS, ERCP, esophageal dilation) Vomiting of blood or coffee  ground material  New, significant abdominal pain  New, significant chest pain or pain under the shoulder blades  Painful or persistently difficult swallowing  New shortness of breath  Black, tarry-looking or red, bloody stools  FOLLOW UP:  If any biopsies were taken you will be contacted by phone or by letter within the next 1-3 weeks. Call [phone]  if you have not heard about the biopsies in 3 weeks.  Please also call with any specific questions about appointments or follow up tests.

## 2022-06-04 NOTE — Progress Notes (Unsigned)
Pt's states no medical or surgical changes since previsit or office visit. 

## 2022-06-04 NOTE — Telephone Encounter (Signed)
OV scheduled for 07/01/22 at 1:30 pm with Anderson Malta, [place]. Pt notified via mychart.

## 2022-06-04 NOTE — Telephone Encounter (Signed)
-----   Message from Thornton Park, MD sent at 06/04/2022  3:59 PM EST ----- Office follow-up with Anderson Malta in 2-3 weeks.  Thanks.  KLB

## 2022-06-04 NOTE — Progress Notes (Signed)
Reviewed.  Penelope Fittro L. Praise Dolecki, MD, MPH  

## 2022-06-04 NOTE — Progress Notes (Unsigned)
For upper endoscopy: Nausea, vomiting, generalized abdominal pain not explained by CT scan, and history of H. Pylori  Please see the 06/03/2022 office note for complete details.  There is been no change in history or physical exam since that time.  The patient remains an appropriate candidate for monitored anesthesia care in the [facility].

## 2022-06-04 NOTE — Progress Notes (Unsigned)
1612 Patient experiencing nausea.  MD updated and Zofran 4 mg IV given, vss  

## 2022-06-04 NOTE — Progress Notes (Signed)
Called to room to assist during endoscopic procedure.  Patient ID and intended procedure confirmed with present staff. Received instructions for my participation in the procedure from the performing physician.  

## 2022-06-04 NOTE — Op Note (Signed)
[place] Patient Name: Anne Lewis Procedure Date: 06/04/2022 3:28 PM MRN: [id number] Endoscopist: Thornton Park MD, MD, [id number] Age: 56 Referring MD:  Date of Birth: [birth year] Gender: Female Account #: [id number] Procedure:                Upper GI endoscopy Indications:              Abdominal pain, Previously treated for Helicobacter                            pylori, Nausea with vomiting Medicines:                Monitored Anesthesia Care Procedure:                Pre-Anesthesia Assessment:                           - Prior to the procedure, a History and Physical                            was performed, and patient medications and                            allergies were reviewed. The patient's tolerance of                            previous anesthesia was also reviewed. The risks                            and benefits of the procedure and the sedation                            options and risks were discussed with the patient.                            All questions were answered, and informed consent                            was obtained. Prior Anticoagulants: The patient has                            taken no anticoagulant or antiplatelet agents. ASA                            Grade Assessment: II - A patient with mild systemic                            disease. After reviewing the risks and benefits,                            the patient was deemed in satisfactory condition to                            undergo the procedure.  After obtaining informed consent, the endoscope was                            passed under direct vision. Throughout the                            procedure, the patient's blood pressure, pulse, and                            oxygen saturations were monitored continuously. The                            Endoscope was introduced through the mouth, and                            advanced to  the third part of duodenum. The upper                            GI endoscopy was accomplished without difficulty.                            The patient tolerated the procedure well. Scope In: Scope Out: Findings:                 The examined esophagus was normal except for mild                            congestion at the GE junction. The z-line is                            located 38 cm from the incisors. Biopsies were                            obtained from the mid/proximal and distal esophagus                            with cold forceps for histology of suspected                            eosinophilic esophagitis.                           The entire examined stomach was normal except for                            mild erythema in the body. Biopsies were taken from                            the antrum, body, and fundus with a cold forceps                            for histology. Estimated blood loss was minimal.  The examined duodenum was normal. Biopsies were                            taken with a cold forceps for histology. Estimated                            blood loss was minimal.                           The cardia and gastric fundus were normal on                            retroflexion.                           The exam was otherwise without abnormality. Complications:            No immediate complications. Estimated Blood Loss:     Estimated blood loss was minimal. Impression:               - Mild distal esophagitis. Biopsied.                           - Mild gastritis. Biopsied.                           - Normal examined duodenum. Biopsied.                           - The examination was otherwise normal.                           - Biopsies were taken with a cold forceps for                            evaluation of eosinophilic esophagitis. Recommendation:           - Patient has a contact number available for                             emergencies. The signs and symptoms of potential                            delayed complications were discussed with the                            patient. Return to normal activities tomorrow.                            Written discharge instructions were provided to the                            patient.                           - Resume previous diet.                           -  Continue present medications.                           - Await pathology results.                           - Office follow-up to review these results. Thornton Park MD, MD 06/04/2022 4:03:19 PM This report has been signed electronically.

## 2022-06-04 NOTE — Progress Notes (Unsigned)
Report given to PACU, vss 

## 2022-06-05 ENCOUNTER — Telehealth: Payer: Self-pay

## 2022-06-05 NOTE — Progress Notes (Signed)
Chief Complaint:   OBESITY Anne Lewis is here to discuss her progress with her obesity treatment plan along with follow-up of her obesity related diagnoses. [name] is on practicing portion control and making smarter food choices, such as increasing vegetables and decreasing simple carbohydrates and states she is following her eating plan approximately 90% of the time. [name] states she is walking and lifting weights for 60 minutes 7 times per week.  Today's visit was #: 32 Starting weight: 207 lbs Starting date: 11/07/2021 Today's weight: 190 lbs Today's date: 05/28/2022 Total lbs lost to date: 17 Total lbs lost since last in-office visit: 6  Interim History: [name] has had a little less nausea and vomiting lately, but she is less consistent. She continues to loose weight. She is tolerating some smoothies that is making with fruit, spinach, and kale. She is taking multivitamins.   Subjective:   1. Gastroesophageal reflux disease with esophagitis without hemorrhage [name] is taking Prilosec with no side effects noted.   2. Vitamin D deficiency [name] is taking Vitamin D prescription with food weekly. No side effects were noted.   3. Nausea and vomiting in adult [name] reports nausea and vomiting are less consistent and she is tolerating more orally. Is taking MiraLax as she feels bloated and constipated. She has a GI evaluation this week.   4. Other depression/emotional eating [name] is taking Wellbutrin and Lexapro with no side effects.   Assessment/Plan:   1. Gastroesophageal reflux disease with esophagitis without hemorrhage Keyairra will continue Prilosec 20 mg once daily, and we will refill for 1 month. She will continue a bland diet.   2. Vitamin D deficiency [name] will continue prescription Vitamin D 50,000 IU every week with food, and we will refill for 90 days.    - Vitamin D, Ergocalciferol, (DRISDOL) 1.25 MG (50000 UNIT) CAPS capsule; Take 1 capsule  (50,000 Units total) by mouth every 7 (seven) days.  Dispense: 12 capsule; Refill: 0  3. Nausea and vomiting in adult [name] will continue with a bland diet, medications, and exercise.   4. Other depression/emotional eating [name] will continue her medication, diet, and exercise. We will refill Wellbutrin SR and Lexapro for 1 month.  - buPROPion (WELLBUTRIN SR) 200 MG 12 hr tablet; Take 1 tablet (200 mg total) by mouth in the morning.  Dispense: 30 tablet; Refill: 0 - escitalopram (LEXAPRO) 10 MG tablet; Take 1 tablet (10 mg total) by mouth daily.  Dispense: 30 tablet; Refill: 0  5. Obesity, Current BMI 28.9 [name] is currently in the action stage of change. As such, her goal is to continue with weight loss efforts. She has agreed to the Category 2 Plan and practicing portion control and making smarter food choices, such as increasing vegetables and decreasing simple carbohydrates.   Exercise goals: As is.   Behavioral modification strategies: increasing lean protein intake, decreasing simple carbohydrates, increasing water intake, meal planning and cooking strategies, and planning for success.  [name] has agreed to follow-up with our clinic in 4 weeks. She was informed of the importance of frequent follow-up visits to maximize her success with intensive lifestyle modifications for her multiple health conditions.   Objective:   Blood pressure 119/74, pulse 77, temperature 98.1 F (36.7 C), height '5\' 8"'$  (1.727 m), weight 190 lb (86.2 kg), last menstrual period 03/14/2018, SpO2 95 %. Body mass index is 28.89 kg/m.  General: Cooperative, alert, well developed, in no acute distress. HEENT: Conjunctivae and lids unremarkable. Cardiovascular: Regular rhythm.  Lungs:  Normal work of breathing. Neurologic: No focal deficits.   Lab Results  Component Value Date   CREATININE 1.04 04/16/2022   BUN 23 04/16/2022   NA 135 04/16/2022   K 4.4 04/16/2022   CL 101 04/16/2022   CO2 27  04/16/2022   Lab Results  Component Value Date   ALT 53 (H) 04/16/2022   AST 31 04/16/2022   ALKPHOS 86 04/16/2022   BILITOT 0.5 04/16/2022   Lab Results  Component Value Date   HGBA1C 5.7 (H) 11/07/2021   HGBA1C 5.9 06/18/2021   HGBA1C 5.9 08/30/2019   Lab Results  Component Value Date   INSULIN 19.9 11/07/2021   Lab Results  Component Value Date   TSH 0.537 11/07/2021   Lab Results  Component Value Date   CHOL 221 (H) 11/07/2021   HDL 44 11/07/2021   LDLCALC 154 (H) 11/07/2021   LDLDIRECT 156.0 06/18/2021   TRIG 125 11/07/2021   CHOLHDL 5.0 (H) 11/07/2021   Lab Results  Component Value Date   VD25OH 69.0 04/30/2022   VD25OH 24.2 (L) 11/07/2021   VD25OH 12.59 (L) 06/18/2021   Lab Results  Component Value Date   WBC 7.6 04/16/2022   HGB 13.5 04/16/2022   HCT 40.3 04/16/2022   MCV 89.9 04/16/2022   PLT 315.0 04/16/2022   No results found for: "IRON", "TIBC", "FERRITIN"  Attestation Statements:   Reviewed by clinician on day of visit: allergies, medications, problem list, medical history, surgical history, family history, social history, and previous encounter notes.   I, Trixie Dredge, am acting as transcriptionist for Dennard Nip, MD.  I have reviewed the above documentation for accuracy and completeness, and I agree with the above. -  Dennard Nip, MD

## 2022-06-05 NOTE — Telephone Encounter (Signed)
Post procedure follow up call, no answer 

## 2022-06-17 NOTE — Progress Notes (Signed)
TeleHealth Visit:  This visit was completed with telemedicine (audio/video) technology. Anne Lewis has verbally consented to this TeleHealth visit. The patient is located at home, the provider is located at home. The participants in this visit include the listed provider and patient. The visit was conducted today via MyChart video.  OBESITY Anne Lewis is here to discuss her progress with her obesity treatment plan along with follow-up of her obesity related diagnoses.   Today's visit was # 14 Starting weight: 207 lbs Starting date: 11/07/2021 Weight at last in office visit: 190 lbs on 05/28/22 Total weight loss: 17 lbs at last in office visit on 05/28/22. Today's reported weight: 194 lbs on 11/14 at GI visit.  Nutrition Plan: Category 2 Plan and practicing portion control and making smarter food choices, such as increasing vegetables and decreasing simple carbohydrates.    Current exercise:  walking 6-7 days per week and lifting weights 2 times per week.  Interim History: She is having less nausea and vomiting but still has episodes.  She now knows her triggers and is able to avoid these.  She mainly tolerates protein-tuna, boiled eggs, chicken, fish and small portions.  She is getting in some vegetables and fruit. She had EGD on 06/04/2022 which was mostly normal except for reflux esophagitis and gastritis.  Omeprazole increased to 40 mg twice daily. She is drinking about a gallon of water a day but reports she is not urinating much. She has lost 15 pounds since September when her GI symptoms started.   Assessment/Plan:  1.  Nausea and vomiting in adult Improved but still has episodes. Has appointment to follow-up with GI on December 11.  Plan: Refill Phenergan 25 mg every 8 hours as needed for nausea and vomiting.  2. Vitamin D Deficiency Vitamin D is at goal of 50.  She is on weekly prescription Vitamin D 50,000 IU.  Lab Results  Component Value Date   VD25OH 69.0  04/30/2022   VD25OH 24.2 (L) 11/07/2021   VD25OH 12.59 (L) 06/18/2021    Plan: Refill prescription vitamin D 50,000 IU weekly.   3. Obesity: Current BMI 28.9 Anne Lewis is currently in the action stage of change. As such, her goal is to continue with weight loss efforts.  She has agreed to practicing portion control and making smarter food choices, such as increasing vegetables and decreasing simple carbohydrates.   Exercise goals:  as is  Behavioral modification strategies: increasing lean protein intake and planning for success.  Anne Lewis has agreed to follow-up with our clinic in 1 weeks.   No orders of the defined types were placed in this encounter.   Medications Discontinued During This Encounter  Medication Reason   metFORMIN (GLUCOPHAGE-XR) 500 MG 24 hr tablet Discontinued by provider   promethazine (PHENERGAN) 25 MG tablet Reorder   Vitamin D, Ergocalciferol, (DRISDOL) 1.25 MG (50000 UNIT) CAPS capsule Reorder     Meds ordered this encounter  Medications   promethazine (PHENERGAN) 25 MG tablet    Sig: Take 1 tablet (25 mg total) by mouth every 8 (eight) hours as needed for nausea or vomiting.    Dispense:  30 tablet    Refill:  0    Order Specific Question:   Supervising Provider    Answer:   Loyal Gambler E [[id number]]   Vitamin D, Ergocalciferol, (DRISDOL) 1.25 MG (50000 UNIT) CAPS capsule    Sig: Take 1 capsule (50,000 Units total) by mouth every 7 (seven) days.    Dispense:  12 capsule  Refill:  0    Order Specific Question:   Supervising Provider    Answer:   Dell Ponto [[id number]]      Objective:   VITALS: Per patient if applicable, see vitals. GENERAL: Alert and in no acute distress. CARDIOPULMONARY: No increased WOB. Speaking in clear sentences.  PSYCH: Pleasant and cooperative. Speech normal rate and rhythm. Affect is appropriate. Insight and judgement are appropriate. Attention is focused, linear, and appropriate.  NEURO: Oriented as arrived to  appointment on time with no prompting.   Lab Results  Component Value Date   CREATININE 1.04 04/16/2022   BUN 23 04/16/2022   NA 135 04/16/2022   K 4.4 04/16/2022   CL 101 04/16/2022   CO2 27 04/16/2022   Lab Results  Component Value Date   ALT 53 (H) 04/16/2022   AST 31 04/16/2022   ALKPHOS 86 04/16/2022   BILITOT 0.5 04/16/2022   Lab Results  Component Value Date   HGBA1C 5.7 (H) 11/07/2021   HGBA1C 5.9 06/18/2021   HGBA1C 5.9 08/30/2019   Lab Results  Component Value Date   INSULIN 19.9 11/07/2021   Lab Results  Component Value Date   TSH 0.537 11/07/2021   Lab Results  Component Value Date   CHOL 221 (H) 11/07/2021   HDL 44 11/07/2021   LDLCALC 154 (H) 11/07/2021   LDLDIRECT 156.0 06/18/2021   TRIG 125 11/07/2021   CHOLHDL 5.0 (H) 11/07/2021   Lab Results  Component Value Date   WBC 7.6 04/16/2022   HGB 13.5 04/16/2022   HCT 40.3 04/16/2022   MCV 89.9 04/16/2022   PLT 315.0 04/16/2022   No results found for: "IRON", "TIBC", "FERRITIN" Lab Results  Component Value Date   VD25OH 69.0 04/30/2022   VD25OH 24.2 (L) 11/07/2021   VD25OH 12.59 (L) 06/18/2021    Attestation Statements:   Reviewed by clinician on day of visit: allergies, medications, problem list, medical history, surgical history, family history, social history, and previous encounter notes.

## 2022-06-18 ENCOUNTER — Telehealth (INDEPENDENT_AMBULATORY_CARE_PROVIDER_SITE_OTHER): Payer: Commercial Managed Care - HMO | Admitting: Family Medicine

## 2022-06-18 ENCOUNTER — Encounter (INDEPENDENT_AMBULATORY_CARE_PROVIDER_SITE_OTHER): Payer: Self-pay | Admitting: Family Medicine

## 2022-06-18 DIAGNOSIS — E559 Vitamin D deficiency, unspecified: Secondary | ICD-10-CM

## 2022-06-18 DIAGNOSIS — E669 Obesity, unspecified: Secondary | ICD-10-CM | POA: Diagnosis not present

## 2022-06-18 DIAGNOSIS — R112 Nausea with vomiting, unspecified: Secondary | ICD-10-CM

## 2022-06-18 DIAGNOSIS — Z6828 Body mass index (BMI) 28.0-28.9, adult: Secondary | ICD-10-CM | POA: Diagnosis not present

## 2022-06-18 MED ORDER — VITAMIN D (ERGOCALCIFEROL) 1.25 MG (50000 UNIT) PO CAPS: 50000.0000 [IU] | ORAL_CAPSULE | ORAL | 0 refills | Status: DC

## 2022-06-18 MED ORDER — PROMETHAZINE HCL 25 MG PO TABS: 25.0000 mg | ORAL_TABLET | Freq: Three times a day (TID) | ORAL | 0 refills | Status: DC | PRN

## 2022-06-25 ENCOUNTER — Encounter (INDEPENDENT_AMBULATORY_CARE_PROVIDER_SITE_OTHER): Payer: Self-pay | Admitting: Family Medicine

## 2022-06-25 ENCOUNTER — Ambulatory Visit (INDEPENDENT_AMBULATORY_CARE_PROVIDER_SITE_OTHER): Payer: Commercial Managed Care - HMO | Admitting: Family Medicine

## 2022-06-25 VITALS — BP 123/78 | HR 87 | Temp 98.4°F | Ht 68.0 in | Wt 181.0 lb

## 2022-06-25 DIAGNOSIS — E669 Obesity, unspecified: Secondary | ICD-10-CM

## 2022-06-25 DIAGNOSIS — E559 Vitamin D deficiency, unspecified: Secondary | ICD-10-CM | POA: Diagnosis not present

## 2022-06-25 DIAGNOSIS — F3289 Other specified depressive episodes: Secondary | ICD-10-CM | POA: Diagnosis not present

## 2022-06-25 DIAGNOSIS — Z6827 Body mass index (BMI) 27.0-27.9, adult: Secondary | ICD-10-CM | POA: Diagnosis not present

## 2022-06-25 MED ORDER — ESCITALOPRAM OXALATE 10 MG PO TABS: 10.0000 mg | ORAL_TABLET | Freq: Every day | ORAL | 0 refills | Status: DC

## 2022-06-25 MED ORDER — BUPROPION HCL ER (SR) 200 MG PO TB12: 200.0000 mg | ORAL_TABLET | Freq: Every morning | ORAL | 0 refills | Status: DC

## 2022-07-01 ENCOUNTER — Ambulatory Visit: Payer: Commercial Managed Care - HMO | Admitting: Physician Assistant

## 2022-07-01 ENCOUNTER — Encounter: Payer: Self-pay | Admitting: Physician Assistant

## 2022-07-01 VITALS — BP 110/74 | HR 97 | Ht 68.5 in | Wt 184.0 lb

## 2022-07-01 DIAGNOSIS — R112 Nausea with vomiting, unspecified: Secondary | ICD-10-CM | POA: Diagnosis not present

## 2022-07-01 DIAGNOSIS — R109 Unspecified abdominal pain: Secondary | ICD-10-CM

## 2022-07-01 DIAGNOSIS — Z87442 Personal history of urinary calculi: Secondary | ICD-10-CM | POA: Diagnosis not present

## 2022-07-01 DIAGNOSIS — R39198 Other difficulties with micturition: Secondary | ICD-10-CM | POA: Diagnosis not present

## 2022-07-01 NOTE — Patient Instructions (Signed)
You will be contacted by [facility] in the next 2 days to arrange a CT scan Abdomen and Pelvis w/ Contrast and Gastric Emptying Study.  The number on your caller ID will be [phone], please answer when they call.  If you have not heard from them in 2 days please call [phone] to schedule.   You have also been referred to Nephrology. Someone from their office will contact you.If you have not heard from them within 1 week, please contact our office and let us know.   Due to recent changes in healthcare laws, you may see the results of your imaging and laboratory studies on MyChart before your provider has had a chance to review them.  We understand that in some cases there may be results that are confusing or concerning to you. Not all laboratory results come back in the same time frame and the provider may be waiting for multiple results in order to interpret others.  Please give [place] 48 hours in order for your provider to thoroughly review all the results before contacting the office for clarification of your results.   Thank you for choosing me and [facility].  Ellouise Newer PA-C

## 2022-07-01 NOTE — Progress Notes (Signed)
Chief Complaint: Follow up EGD and Abdominal pain  Review of gastrointestinal problems: 1. Gastritis, H. pylori positive on EGD biopsies May 2011. Put on appropriate antibiotics 2. Routine risk for colon cancer, colonoscopy May 2011 found no polyps, recall colonoscopy at 10 year interval.  HPI:    Anne Lewis is a 56 year old female, previously known to Dr. Ardis Lewis, recently followed with Dr. Tarri Lewis, who presents to clinic today for follow-up after recent EGD and abdominal pain.      09/03/2021 laparoscopic appendectomy.    10/15/2021 patient seen in clinic by Dr. Ardis Lewis.  At that time discussed that her sister was diagnosed with colon cancer.  That time was because she had felt no better since surgery and probably worse.  She had upper abdominal discomfort and a HIDA scan which showed patent cystic and common bile ducts normal gallbladder ejection fraction.  She was taking Carafate 4 times daily which is not helping.  That time noted she gained 80 pounds including 8 pounds in the past month or so over the past year.  At that time Dr. Ardis Lewis noted unclear etiology but was struck by the fact that she was fairly tender on exam throughout her abdomen.  Another CT scan was ordered with contrast and repeat labs.  Also H. pylori antigen testing.  She was given hyoscyamine.  Discussed possible EGD and colonoscopy.    10/23/2021 CTAP with contrast was negative.    H. pylori stool antigen was never returned.    04/16/2022 CBC with differential was normal.  Hepatic function panel with minimally elevated ALT at 53.  Amylase normal.    05/28/2022 patient saw her PCP for reflux    06/03/2022 patient seen in clinic by me and described that since she got her appendix out in February her GI system had been weird radiating from diarrhea to constipation and then nausea.  Also vomiting episodes.  At that time also discussed family history of colon cancer.  Patient was set up for an urgent EGD and her Omeprazole was  increased to 40 mg twice daily.  Also recommended scheduling out her Zofran 4 mg 1-2 tabs every 6 hours.  Discussed using Phenergan for breakthrough.  Explained that she was due for colonoscopy but did not feel like she could handle the prep at that point.    06/04/2022 EGD for abdominal pain and previously treated H. pylori with nausea and vomiting.  There was mild distal esophagitis, mild gastritis and otherwise normal.  Pathology showed mild reflux esophagitis and mild chronic gastritis negative for H. pylori.  At that point she was continued on Omeprazole 40 mg twice daily.    06/18/2022 patient followed with family medicine about her nausea and vomiting and weight management.  At that time noted that she was having less nausea and vomiting but had triggering foods but was able to avoid them.  Her Phenergan was refilled 25 mg every 8 hours as needed.    Today, patient returns to clinic and continues with nausea and episodes of vomiting worse with certain foods, but even sometimes just with water.  She tells me it is very aggravating because she cannot eat which she used to.  Also describes an instance of eating a steak a day before and then vomiting that same steak piece up a day later.  The antiemetic she is taking now only help a little bit.  She is very aggravated with ongoing symptoms and wants to figure out what is wrong.  Along with the above describes a right flank pain which seems to wrap around from her back down into her vagina, apparently sometimes this is worse than others, she has tried things for muscle pain relief which have not helped at all.  Using MiraLAX and having better bowel movements also does not help relieve the pain.  It has worsened over the past month or so.  Has also noticed a darkening of her urine even though she drinks a gallon of water a day and feels like she has to push in order to initiate her urine stream.  Describes history of previous kidney stones.    Denies fever,  chills, blood in her stool or symptoms that awaken her from sleep.  Past Medical History:  Diagnosis Date   anemia    Anxiety    Aortic atherosclerosis (HCC)    B12 deficiency    Back pain    BRCA negative 11/2013   Chest pain    Constipation    Fatty liver    Gallbladder problem    H pylori ulcer    Heartburn    Joint pain    Migraines    Palpitation    sleep apnea    Stomach ulcer    Ulcer of gastric fundus    Vitamin D deficiency     Past Surgical History:  Procedure Laterality Date   CESAREAN SECTION  3833,3832   ENDOMETRIAL CRYOABLATION  12.19.2012   HER OPTION - office performed   LAPAROSCOPIC APPENDECTOMY N/A 09/03/2021   Procedure: APPENDECTOMY LAPAROSCOPIC;  Surgeon: Ralene Ok, MD;  Location: [facility];  Service: General;  Laterality: N/A;   TONSILLECTOMY  1987    Current Outpatient Medications  Medication Sig Dispense Refill   buPROPion (WELLBUTRIN SR) 200 MG 12 hr tablet Take 1 tablet (200 mg total) by mouth in the morning. 30 tablet 0   escitalopram (LEXAPRO) 10 MG tablet Take 1 tablet (10 mg total) by mouth daily. 30 tablet 0   hyoscyamine (LEVSIN SL) 0.125 MG SL tablet Place 1 tablet (0.125 mg total) under the tongue every 6 (six) hours as needed for cramping. 30 tablet 6   naproxen sodium (ALEVE) 220 MG tablet Take 440 mg by mouth daily as needed.     omeprazole (PRILOSEC) 40 MG capsule Take 1 capsule (40 mg total) by mouth 2 (two) times daily. 60 capsule 3   ondansetron (ZOFRAN-ODT) 4 MG disintegrating tablet Take 1 tablet (4 mg total) by mouth every 8 (eight) hours as needed for nausea or vomiting. 20 tablet 1   promethazine (PHENERGAN) 25 MG tablet Take 1 tablet (25 mg total) by mouth every 8 (eight) hours as needed for nausea or vomiting. 30 tablet 0   Vitamin D, Ergocalciferol, (DRISDOL) 1.25 MG (50000 UNIT) CAPS capsule Take 1 capsule (50,000 Units total) by mouth every 7 (seven) days. 12 capsule 0   ZOLMitriptan (ZOMIG) 2.5 MG tablet Take 1 tablet  (2.5 mg total) by mouth once for 1 dose. May repeat in 2 hours if headache persists or recurs. 10 tablet 1   No current facility-administered medications for this visit.    Allergies as of 07/01/2022 - Review Complete 06/25/2022  Allergen Reaction Noted   Aspirin Swelling and Other (See Comments) 05/28/2011   Augmentin [amoxicillin-pot clavulanate] Swelling 01/03/2018   Oxycodone Nausea And Vomiting 01/03/2018    Family History  Problem Relation Age of Onset   Hypertension Mother    Cancer Mother 8  intestational and ovarian   Diverticulitis Mother    Depression Mother    Anxiety disorder Mother    Sleep apnea Mother    Stroke Father    Diabetes Father    Hypertension Father    Heart disease Father    Cancer Sister 81       ovarian and intestional   Cancer Brother 41       GI cancer   Cancer Maternal Grandmother 75       ovarian and pancreatic   Cancer Maternal Grandfather 82       pancreatic   Breast cancer Maternal Aunt 69   Cancer Paternal Aunt 73       stomach   Cancer Cousin 39       ovarian   Cancer Cousin 47       breast    Social History   Socioeconomic History   Marital status: Married    Spouse name: Film/video editor   Number of children: Not on file   Years of education: Not on file   Highest education level: Not on file  Occupational History   Occupation: Geneticist, molecular  Tobacco Use   Smoking status: Some Days    Types: Cigarettes   Smokeless tobacco: Never  Vaping Use   Vaping Use: Never used  Substance and Sexual Activity   Alcohol use: Yes    Comment: occ   Drug use: No   Sexual activity: Yes    Birth control/protection: Post-menopausal  Other Topics Concern   Not on file  Social History Narrative   Not on file   Social Determinants of Health   Financial Resource Strain: Not on file  Food Insecurity: Not on file  Transportation Needs: Not on file  Physical Activity: Not on file  Stress: Not on file  Social  Connections: Not on file  Intimate Partner Violence: Not on file    Review of Systems:    Constitutional: No weight loss, fever or chills Cardiovascular: No chest pain Respiratory: No SOB  Gastrointestinal: See HPI and otherwise negative   Physical Exam:  Vital signs: BP 110/74   Pulse 97   Ht 5' 8.5" (1.74 m)   Wt 184 lb (83.5 kg)   LMP 03/14/2018   BMI 27.57 kg/m    Constitutional:   Pleasant Caucasian female appears to be in NAD, Well developed, Well nourished, alert and cooperative Respiratory: Respirations even and unlabored. Lungs clear to auscultation bilaterally.   No wheezes, crackles, or rhonchi.  Cardiovascular: Normal S1, S2. No MRG. Regular rate and rhythm. No peripheral edema, cyanosis or pallor.  Gastrointestinal:  Soft, nondistended, mild epigastric TTP. No rebound or guarding. Normal bowel sounds. No appreciable masses or hepatomegaly. Rectal:  Not performed.  Msk:  Symmetrical without gross deformities. Without edema, no deformity or joint abnormality. + TTP along right flank and down into right lower quadrant Psychiatric: Demonstrates good judgement and reason without abnormal affect or behaviors.  RELEVANT LABS AND IMAGING: CBC    Component Value Date/Time   WBC 7.6 04/16/2022 1629   RBC 4.48 04/16/2022 1629   HGB 13.5 04/16/2022 1629   HGB 14.7 11/07/2021 1029   HCT 40.3 04/16/2022 1629   HCT 43.8 11/07/2021 1029   PLT 315.0 04/16/2022 1629   PLT 338 11/07/2021 1029   MCV 89.9 04/16/2022 1629   MCV 89 11/07/2021 1029   MCH 29.5 01/24/2022 1739   MCHC 33.5 04/16/2022 1629   RDW 13.7 04/16/2022 1629   RDW  12.6 11/07/2021 1029   LYMPHSABS 1.6 04/16/2022 1629   LYMPHSABS 2.0 11/07/2021 1029   MONOABS 0.5 04/16/2022 1629   EOSABS 0.2 04/16/2022 1629   EOSABS 0.3 11/07/2021 1029   BASOSABS 0.1 04/16/2022 1629   BASOSABS 0.1 11/07/2021 1029    CMP     Component Value Date/Time   NA 135 04/16/2022 1629   NA 141 11/07/2021 1029   K 4.4  04/16/2022 1629   CL 101 04/16/2022 1629   CO2 27 04/16/2022 1629   GLUCOSE 90 04/16/2022 1629   BUN 23 04/16/2022 1629   BUN 22 11/07/2021 1029   CREATININE 1.04 04/16/2022 1629   CREATININE 0.91 08/12/2016 0837   CALCIUM 9.8 04/16/2022 1629   PROT 8.0 04/16/2022 1629   PROT 7.6 11/07/2021 1029   ALBUMIN 4.6 04/16/2022 1629   ALBUMIN 5.0 (H) 11/07/2021 1029   AST 31 04/16/2022 1629   ALT 53 (H) 04/16/2022 1629   ALKPHOS 86 04/16/2022 1629   BILITOT 0.5 04/16/2022 1629   BILITOT <0.2 11/07/2021 1029   GFRNONAA >60 01/24/2022 1739    Assessment: 1.  Nausea and vomiting: Continues over the past 9 months, EGD with mild gastritis and otherwise unrevealing, symptoms no better on twice daily PPI, describes vomiting states she had eaten the day before; consider gastroparesis versus relation to below 2.  Right-sided flank pain: Continues with a right sided flank pain, previous CT 6 months ago unrevealing, but pain has worsened and over the past month or 2 has noticed some changes in urination, history of kidney stones; consider kidney stone 3.  Family history of colon cancer: In her sister, patient is overdue for colonoscopy but at this point could not tolerate bowel prep  Plan: 1.  Will evaluate with repeat CT AP as symptoms have worsened with change in urination and history of kidney stones, this could also be adding to her nausea.  Also sent referral to nephrology for further evaluation. 2.  Also ordered gastric emptying study. 3.  Continue current medications. 4.  Patient to follow in clinic per recommendations after imaging above.  She will need a repeat colonoscopy at some point when she can tolerate bowel prep.  Ellouise Newer, PA-C [facility] 07/01/2022, 1:22 PM  Cc: Libby Maw

## 2022-07-08 ENCOUNTER — Telehealth: Payer: Self-pay

## 2022-07-08 NOTE — Telephone Encounter (Signed)
Thank you [facility], Referral Faxed to [facility].

## 2022-07-08 NOTE — Progress Notes (Signed)
Chief Complaint:   OBESITY Anne Lewis is here to discuss her progress with her obesity treatment plan along with follow-up of her obesity related diagnoses. Anne Lewis is on practicing portion control and making smarter food choices, such as increasing vegetables and decreasing simple carbohydrates and states she is following her eating plan approximately 80% of the time. Anne Lewis states she is at the gym and walking for 60 minutes 5-7 times per week.  Today's visit was #: 15 Starting weight: 207 lbs Starting date: 11/07/2021 Today's weight: 181 lbs Today's date: 06/25/2022 Total lbs lost to date: 26 Total lbs lost since last in-office visit: 9  Interim History: Anne Lewis continues to lose weight but this is partly due to her ongoing consistent nausea for which she is following up with GI. She is working on increasing her water and protein drinks to maintain her nutrition and hydration.   Subjective:   1. Vitamin D deficiency Anne Lewis is on Vitamin D, and her last level was at goal.   2. Other depression/emotional eating Anne Lewis feels she is doing well with the increased dose of Wellbutrin.   Assessment/Plan:   1. Vitamin D deficiency Anne Lewis will continue Vitamin D, and we will recheck labs in 2 months.   2. Other depression/emotional eating Anne Lewis will continue her medications, and we will refill Lexapro and Wellbutrin for 1 month.   - escitalopram (LEXAPRO) 10 MG tablet; Take 1 tablet (10 mg total) by mouth daily.  Dispense: 30 tablet; Refill: 0 - buPROPion (WELLBUTRIN SR) 200 MG 12 hr tablet; Take 1 tablet (200 mg total) by mouth in the morning.  Dispense: 30 tablet; Refill: 0  3. Obesity, Current BMI 27.5 Anne Lewis is currently in the action stage of change. As such, her goal is to continue with weight loss efforts. She has agreed to practicing portion control and making smarter food choices, such as increasing vegetables and decreasing simple carbohydrates.   Exercise  goals: As is.   Behavioral modification strategies: increasing lean protein intake and increasing water intake.  Anne Lewis has agreed to follow-up with our clinic in 6 weeks. She was informed of the importance of frequent follow-up visits to maximize her success with intensive lifestyle modifications for her multiple health conditions.   Objective:   Blood pressure 123/78, pulse 87, temperature 98.4 F (36.9 C), height '5\' 8"'$  (1.727 m), weight 181 lb (82.1 kg), last menstrual period 03/14/2018, SpO2 100 %. Body mass index is 27.52 kg/m.  General: Cooperative, alert, well developed, in no acute distress. HEENT: Conjunctivae and lids unremarkable. Cardiovascular: Regular rhythm.  Lungs: Normal work of breathing. Neurologic: No focal deficits.   Lab Results  Component Value Date   CREATININE 1.04 04/16/2022   BUN 23 04/16/2022   NA 135 04/16/2022   K 4.4 04/16/2022   CL 101 04/16/2022   CO2 27 04/16/2022   Lab Results  Component Value Date   ALT 53 (H) 04/16/2022   AST 31 04/16/2022   ALKPHOS 86 04/16/2022   BILITOT 0.5 04/16/2022   Lab Results  Component Value Date   HGBA1C 5.7 (H) 11/07/2021   HGBA1C 5.9 06/18/2021   HGBA1C 5.9 08/30/2019   Lab Results  Component Value Date   INSULIN 19.9 11/07/2021   Lab Results  Component Value Date   TSH 0.537 11/07/2021   Lab Results  Component Value Date   CHOL 221 (H) 11/07/2021   HDL 44 11/07/2021   LDLCALC 154 (H) 11/07/2021   LDLDIRECT 156.0 06/18/2021   TRIG  125 11/07/2021   CHOLHDL 5.0 (H) 11/07/2021   Lab Results  Component Value Date   VD25OH 69.0 04/30/2022   VD25OH 24.2 (L) 11/07/2021   VD25OH 12.59 (L) 06/18/2021   Lab Results  Component Value Date   WBC 7.6 04/16/2022   HGB 13.5 04/16/2022   HCT 40.3 04/16/2022   MCV 89.9 04/16/2022   PLT 315.0 04/16/2022   No results found for: "IRON", "TIBC", "FERRITIN"  Attestation Statements:   Reviewed by clinician on day of visit: allergies, medications,  problem list, medical history, surgical history, family history, social history, and previous encounter notes.  I, Trixie Dredge, am acting as transcriptionist for Dennard Nip, MD.  I have reviewed the above documentation for accuracy and completeness, and I agree with the above. -  Dennard Nip, MD

## 2022-07-08 NOTE — Telephone Encounter (Signed)
Received a call from [place] at [facility] regarding urgent referral that was placed for kidney stones. Heather informed that they do not see patient for kidney stones and the referral should be placed to Urology.

## 2022-07-10 ENCOUNTER — Other Ambulatory Visit (INDEPENDENT_AMBULATORY_CARE_PROVIDER_SITE_OTHER): Payer: Self-pay | Admitting: Family Medicine

## 2022-07-10 DIAGNOSIS — R7303 Prediabetes: Secondary | ICD-10-CM

## 2022-07-11 ENCOUNTER — Telehealth: Payer: Self-pay

## 2022-07-11 NOTE — Telephone Encounter (Signed)
Patient's CT Scan will need to be r/s to [facility] due to [employer]. Left detailed message for patient asking for return call. Information was also sent to patient via my-chart.    You have been scheduled for a CT scan of the abdomen and pelvis at [place] [place]. You are scheduled on 08/12/22 at 2:00 pm. You should arrive 15 minutes prior to your appointment time for registration.  You will need to go to [facility] at [place] to pick up Contrast Kit prior to your CT Scan.   Please follow instructions that will be given by [facility].   You may take any medications as prescribed with a small amount of water, if necessary. If you take any of the following medications: METFORMIN, GLUCOPHAGE, GLUCOVANCE, AVANDAMET, RIOMET, FORTAMET, [place] MET, JANUMET, GLUMETZA or METAGLIP, you MAY be asked to HOLD this medication 48 hours AFTER the exam.   The purpose of you drinking the oral contrast is to aid in the visualization of your intestinal tract. The contrast solution may cause some diarrhea. Depending on your individual set of symptoms, you may also receive an intravenous injection of x-ray contrast/dye. Plan on being at [facility] for 45 minutes or longer, depending on the type of exam you are having performed.   If you have any questions regarding your exam or if you need to reschedule, you may call [facility]  at [phone] between the hours of 8:00 am and 5:00 pm, Monday-Friday.

## 2022-07-16 ENCOUNTER — Ambulatory Visit (HOSPITAL_COMMUNITY): Payer: Commercial Managed Care - HMO

## 2022-07-19 ENCOUNTER — Ambulatory Visit (HOSPITAL_COMMUNITY): Payer: Commercial Managed Care - HMO

## 2022-07-24 ENCOUNTER — Ambulatory Visit (INDEPENDENT_AMBULATORY_CARE_PROVIDER_SITE_OTHER): Payer: Commercial Managed Care - HMO | Admitting: Family Medicine

## 2022-07-24 NOTE — Telephone Encounter (Signed)
Returned call to patient, advised that she needs to contact her insurance company to see which office or provider will be covered. Once patient has this information then she can let us know and we will send referral. Pt voiced understanding.

## 2022-07-24 NOTE — Progress Notes (Unsigned)
TeleHealth Visit:  This visit was completed with telemedicine (audio/video) technology. Anne Lewis has verbally consented to this TeleHealth visit. The patient is located at home, the provider is located at home. The participants in this visit include the listed provider and patient. The visit was conducted today via MyChart video.  OBESITY Anne Lewis is here to discuss her progress with her obesity treatment plan along with follow-up of her obesity related diagnoses.   Today's visit was # 16 Starting weight: 207 lbs Starting date: 11/07/2021 Weight at last in office visit: 181 lbs on 06/25/22 Total weight loss: 26 lbs at last in office visit on 06/25/22. Today's reported weight: *** lbs No weight reported.  Nutrition Plan: practicing portion control and making smarter food choices, such as increasing vegetables and decreasing simple carbohydrates.   Current exercise: {exercise types:16438} gym and walking for 60 minutes 5-7 times per week.  Interim History:  ***  Assessment/Plan:  1. ***  2. ***  3. ***  Obesity: Current BMI *** Anne Lewis {CHL AMB IS/IS NOT:210130109} currently in the action stage of change. As such, her goal is to {MWMwtloss#1:210800005}.  She has agreed to {MWMwtlossportion/plan2:23431}.   Exercise goals: {MWM EXERCISE RECS:23473}  Behavioral modification strategies: {MWMwtlossdietstrategies3:23432}.  Anne Lewis has agreed to follow-up with our clinic in {NUMBER 1-10:22536} weeks.   No orders of the defined types were placed in this encounter.   There are no discontinued medications.   No orders of the defined types were placed in this encounter.     Objective:   VITALS: Per patient if applicable, see vitals. GENERAL: Alert and in no acute distress. CARDIOPULMONARY: No increased WOB. Speaking in clear sentences.  PSYCH: Pleasant and cooperative. Speech normal rate and rhythm. Affect is appropriate. Insight and judgement are appropriate.  Attention is focused, linear, and appropriate.  NEURO: Oriented as arrived to appointment on time with no prompting.   Lab Results  Component Value Date   CREATININE 1.04 04/16/2022   BUN 23 04/16/2022   NA 135 04/16/2022   K 4.4 04/16/2022   CL 101 04/16/2022   CO2 27 04/16/2022   Lab Results  Component Value Date   ALT 53 (H) 04/16/2022   AST 31 04/16/2022   ALKPHOS 86 04/16/2022   BILITOT 0.5 04/16/2022   Lab Results  Component Value Date   HGBA1C 5.7 (H) 11/07/2021   HGBA1C 5.9 06/18/2021   HGBA1C 5.9 08/30/2019   Lab Results  Component Value Date   INSULIN 19.9 11/07/2021   Lab Results  Component Value Date   TSH 0.537 11/07/2021   Lab Results  Component Value Date   CHOL 221 (H) 11/07/2021   HDL 44 11/07/2021   LDLCALC 154 (H) 11/07/2021   LDLDIRECT 156.0 06/18/2021   TRIG 125 11/07/2021   CHOLHDL 5.0 (H) 11/07/2021   Lab Results  Component Value Date   WBC 7.6 04/16/2022   HGB 13.5 04/16/2022   HCT 40.3 04/16/2022   MCV 89.9 04/16/2022   PLT 315.0 04/16/2022   No results found for: "IRON", "TIBC", "FERRITIN" Lab Results  Component Value Date   VD25OH 69.0 04/30/2022   VD25OH 24.2 (L) 11/07/2021   VD25OH 12.59 (L) 06/18/2021    Attestation Statements:   Reviewed by clinician on day of visit: allergies, medications, problem list, medical history, surgical history, family history, social history, and previous encounter notes.  ***(delete if time-based billing not used) Time spent on visit including the items listed below was *** minutes.  -preparing to see the patient (e.g.,  review of tests, history, previous notes) -obtaining and/or reviewing separately obtained history -counseling and educating the patient/family/caregiver -documenting clinical information in the electronic or other health record -ordering medications, tests, or procedures -independently interpreting results and communicating results to the patient/  family/caregiver -referring and communicating with other health care professionals  -care coordination

## 2022-07-24 NOTE — Telephone Encounter (Signed)
Patient called states she needs another referral to Urology due to her insurance.

## 2022-07-25 ENCOUNTER — Telehealth (INDEPENDENT_AMBULATORY_CARE_PROVIDER_SITE_OTHER): Payer: 59 | Admitting: Family Medicine

## 2022-07-25 ENCOUNTER — Encounter (INDEPENDENT_AMBULATORY_CARE_PROVIDER_SITE_OTHER): Payer: Self-pay | Admitting: Family Medicine

## 2022-07-25 DIAGNOSIS — G4700 Insomnia, unspecified: Secondary | ICD-10-CM

## 2022-07-25 DIAGNOSIS — F3289 Other specified depressive episodes: Secondary | ICD-10-CM | POA: Diagnosis not present

## 2022-07-25 DIAGNOSIS — R112 Nausea with vomiting, unspecified: Secondary | ICD-10-CM

## 2022-07-25 DIAGNOSIS — E669 Obesity, unspecified: Secondary | ICD-10-CM

## 2022-07-25 DIAGNOSIS — Z6827 Body mass index (BMI) 27.0-27.9, adult: Secondary | ICD-10-CM

## 2022-07-25 MED ORDER — ONDANSETRON HCL 4 MG PO TABS: 4.0000 mg | ORAL_TABLET | Freq: Three times a day (TID) | ORAL | 0 refills | Status: DC | PRN

## 2022-07-25 NOTE — Progress Notes (Signed)
Reviewed and agree with management plans. ? ?Lynnae Ludemann L. Kentley Cedillo, MD, MPH  ?

## 2022-08-02 ENCOUNTER — Ambulatory Visit (INDEPENDENT_AMBULATORY_CARE_PROVIDER_SITE_OTHER): Payer: 59 | Admitting: Nurse Practitioner

## 2022-08-02 ENCOUNTER — Encounter: Payer: Self-pay | Admitting: Nurse Practitioner

## 2022-08-02 VITALS — BP 112/80 | HR 101 | Temp 96.9°F | Ht 68.5 in | Wt 175.0 lb

## 2022-08-02 DIAGNOSIS — R1031 Right lower quadrant pain: Secondary | ICD-10-CM | POA: Diagnosis not present

## 2022-08-02 DIAGNOSIS — J0100 Acute maxillary sinusitis, unspecified: Secondary | ICD-10-CM | POA: Diagnosis not present

## 2022-08-02 DIAGNOSIS — R0981 Nasal congestion: Secondary | ICD-10-CM | POA: Diagnosis not present

## 2022-08-02 DIAGNOSIS — Z1231 Encounter for screening mammogram for malignant neoplasm of breast: Secondary | ICD-10-CM

## 2022-08-02 LAB — POC COVID19 BINAXNOW: SARS Coronavirus 2 Ag: NEGATIVE

## 2022-08-02 MED ORDER — DOXYCYCLINE HYCLATE 100 MG PO TABS: 100.0000 mg | ORAL_TABLET | Freq: Two times a day (BID) | ORAL | 0 refills | Status: DC

## 2022-08-02 MED ORDER — PREDNISONE 20 MG PO TABS: 20.0000 mg | ORAL_TABLET | Freq: Every day | ORAL | 0 refills | Status: DC

## 2022-08-02 NOTE — Patient Instructions (Addendum)
It was great to see you!  Start doxycycline 1 tablet twice a day with food.   Start prednisone 2 tablets in the morning with food.   Keep doing the nasal rinse and sinus medication.   Call to schedule your second shingles vaccine when you are feeling better  You are scheduled for your mammogram at our office on 09/16/22 at 1pm  Let's follow-up if your symptoms worsen or don't improve.   Take care,  Vance Peper, NP

## 2022-08-02 NOTE — Assessment & Plan Note (Signed)
Start doxycycline '100mg'$  BID x 10 days. Will also treat with prednisone '40mg'$  daily for 5 days for left mid ear effusion. She does have a history of sinusitis and needing to go to the ER in July for this. Continue sinus OTC medication and nasal saline rinse. If having ongoing episodes, may need referral to ENT.

## 2022-08-02 NOTE — Progress Notes (Signed)
Acute Office Visit  Subjective:     Patient ID: Anne Lewis, female    DOB: Nov 24, 1965, 57 y.o.   MRN: [id number]  Chief Complaint  Patient presents with   Acute Visit    Started on mon or tues, facial pressure , nasal congestion,  sinus mediction that hasn't worked  has fever yesterday 101. , no body aches , sinus head aches     HPI Patient is in today for left sided sinus pain, ear pressure, and fever for 5 days.   UPPER RESPIRATORY TRACT INFECTION  Fever: yes Cough: no Shortness of breath: no Wheezing: no Chest pain: no Chest tightness: no Chest congestion: no Nasal congestion: yes Runny nose: yes Post nasal drip: no Sneezing: no Sore throat: no Swollen glands: no Sinus pressure: yes Headache: yes Face pain: yes Toothache: no Ear pain: no bilateral Ear pressure: yes left Eyes red/itching:no Eye drainage/crusting: no  Vomiting: no Rash: no Fatigue: yes Sick contacts: no Strep contacts: no  Context: worse Recurrent sinusitis: no Relief with OTC cold/cough medications: no  Treatments attempted: sinus medication, nasal saline rinse  ROS See pertinent positives and negatives per HPI.     Objective:    BP 112/80   Pulse (!) 101   Temp (!) 96.9 F (36.1 C)   Ht 5' 8.5" (1.74 m)   Wt 175 lb (79.4 kg)   LMP 03/14/2018   SpO2 98%   BMI 26.22 kg/m    Physical Exam Vitals and nursing note reviewed.  Constitutional:      General: She is not in acute distress.    Appearance: Normal appearance.  HENT:     Head: Normocephalic.     Right Ear: Tympanic membrane, ear canal and external ear normal.     Left Ear: Ear canal and external ear normal. A middle ear effusion is present.     Nose:     Right Sinus: No maxillary sinus tenderness or frontal sinus tenderness.     Left Sinus: Maxillary sinus tenderness and frontal sinus tenderness present.  Eyes:     Conjunctiva/sclera: Conjunctivae normal.  Cardiovascular:     Rate and Rhythm: Normal rate  and regular rhythm.     Pulses: Normal pulses.     Heart sounds: Normal heart sounds.  Pulmonary:     Effort: Pulmonary effort is normal.     Breath sounds: Normal breath sounds.  Musculoskeletal:     Cervical back: Normal range of motion and neck supple. No tenderness.  Lymphadenopathy:     Cervical: No cervical adenopathy.  Skin:    General: Skin is warm.  Neurological:     General: No focal deficit present.     Mental Status: She is alert and oriented to person, place, and time.  Psychiatric:        Mood and Affect: Mood normal.        Behavior: Behavior normal.        Thought Content: Thought content normal.        Judgment: Judgment normal.     Results for orders placed or performed in visit on 08/02/22  POC COVID-19  Result Value Ref Range   SARS Coronavirus 2 Ag Negative Negative        Assessment & Plan:   Problem List Items Addressed This Visit       Respiratory   Acute non-recurrent sinusitis - Primary    Start doxycycline '100mg'$  BID x 10 days. Will also treat with prednisone '40mg'$  daily  for 5 days for left mid ear effusion. She does have a history of sinusitis and needing to go to the ER in July for this. Continue sinus OTC medication and nasal saline rinse. If having ongoing episodes, may need referral to ENT.       Relevant Medications   doxycycline (VIBRA-TABS) 100 MG tablet   predniSONE (DELTASONE) 20 MG tablet   Other Visit Diagnoses     Nasal congestion       POC covid-19 negative. Can continue OTC sinus medication and nasal saline rinses. See plan above for sinusitis   Relevant Orders   POC COVID-19 (Completed)   Right lower quadrant abdominal pain       she has CT scheduled. She has also been having a hard time finding a GYN for pain and pap. Will place referral to GYN.   Relevant Orders   Ambulatory referral to Gynecology   Encounter for screening mammogram for malignant neoplasm of breast       Mammogram ordered today. She is on the mobile  mammogram schedule for [facility] 09/16/22 at 1pm.   Relevant Orders   MM 3D SCREEN BREAST BILATERAL       Meds ordered this encounter  Medications   doxycycline (VIBRA-TABS) 100 MG tablet    Sig: Take 1 tablet (100 mg total) by mouth 2 (two) times daily.    Dispense:  20 tablet    Refill:  0   predniSONE (DELTASONE) 20 MG tablet    Sig: Take 1 tablet (20 mg total) by mouth daily with breakfast.    Dispense:  10 tablet    Refill:  0    Return if symptoms worsen or fail to improve.  Charyl Dancer, NP

## 2022-08-06 ENCOUNTER — Ambulatory Visit (INDEPENDENT_AMBULATORY_CARE_PROVIDER_SITE_OTHER): Payer: Commercial Managed Care - HMO | Admitting: Family Medicine

## 2022-08-07 ENCOUNTER — Ambulatory Visit (INDEPENDENT_AMBULATORY_CARE_PROVIDER_SITE_OTHER): Payer: 59 | Admitting: Family Medicine

## 2022-08-07 ENCOUNTER — Encounter (INDEPENDENT_AMBULATORY_CARE_PROVIDER_SITE_OTHER): Payer: Self-pay | Admitting: Family Medicine

## 2022-08-07 VITALS — BP 152/80 | HR 88 | Temp 98.3°F | Ht 68.0 in | Wt 170.0 lb

## 2022-08-07 DIAGNOSIS — Z6825 Body mass index (BMI) 25.0-25.9, adult: Secondary | ICD-10-CM

## 2022-08-07 DIAGNOSIS — E669 Obesity, unspecified: Secondary | ICD-10-CM | POA: Diagnosis not present

## 2022-08-07 DIAGNOSIS — R030 Elevated blood-pressure reading, without diagnosis of hypertension: Secondary | ICD-10-CM

## 2022-08-07 DIAGNOSIS — F3289 Other specified depressive episodes: Secondary | ICD-10-CM

## 2022-08-07 DIAGNOSIS — K2100 Gastro-esophageal reflux disease with esophagitis, without bleeding: Secondary | ICD-10-CM | POA: Diagnosis not present

## 2022-08-07 MED ORDER — BUPROPION HCL ER (SR) 200 MG PO TB12: 200.0000 mg | ORAL_TABLET | Freq: Every morning | ORAL | 0 refills | Status: DC

## 2022-08-07 MED ORDER — OMEPRAZOLE 40 MG PO CPDR: 40.0000 mg | DELAYED_RELEASE_CAPSULE | Freq: Two times a day (BID) | ORAL | 0 refills | Status: DC

## 2022-08-07 MED ORDER — ESCITALOPRAM OXALATE 10 MG PO TABS: 10.0000 mg | ORAL_TABLET | Freq: Every day | ORAL | 0 refills | Status: DC

## 2022-08-08 DIAGNOSIS — K2100 Gastro-esophageal reflux disease with esophagitis, without bleeding: Secondary | ICD-10-CM | POA: Insufficient documentation

## 2022-08-08 DIAGNOSIS — R030 Elevated blood-pressure reading, without diagnosis of hypertension: Secondary | ICD-10-CM | POA: Insufficient documentation

## 2022-08-12 ENCOUNTER — Ambulatory Visit
Admission: RE | Admit: 2022-08-12 | Discharge: 2022-08-12 | Disposition: A | Discharge status: Home / Self Care | Payer: 59 | Source: Ambulatory Visit | Attending: Physician Assistant | Admitting: Physician Assistant

## 2022-08-12 DIAGNOSIS — R39198 Other difficulties with micturition: Secondary | ICD-10-CM

## 2022-08-12 DIAGNOSIS — R112 Nausea with vomiting, unspecified: Secondary | ICD-10-CM

## 2022-08-12 DIAGNOSIS — R109 Unspecified abdominal pain: Secondary | ICD-10-CM

## 2022-08-12 MED ORDER — IOPAMIDOL (ISOVUE-300) INJECTION 61%
100.0000 mL | Freq: Once | INTRAVENOUS | Status: AC | PRN
  Administered 2022-08-12: 100 mL via INTRAVENOUS

## 2022-08-13 NOTE — Progress Notes (Unsigned)
Chief Complaint:   OBESITY Anne Lewis is here to discuss her progress with her obesity treatment plan along with follow-up of her obesity related diagnoses. Anne Lewis is on {MWMwtlossportion/plan2:23431} and states she is following her eating plan approximately ***% of the time. Anne Lewis states she is *** *** minutes *** times per week.  Today's visit was #: *** Starting weight: *** Starting date: *** Today's weight: *** Today's date: 08/07/2022 Total lbs lost to date: *** Total lbs lost since last in-office visit: ***  Interim History: ***  Subjective:   1. Gastroesophageal reflux disease with esophagitis without hemorrhage ***  2. Elevated blood pressure reading without diagnosis of hypertension ***  3. Emotional Eating Behavior ***  Assessment/Plan:   1. Gastroesophageal reflux disease with esophagitis without hemorrhage *** - omeprazole (PRILOSEC) 40 MG capsule; Take 1 capsule (40 mg total) by mouth 2 (two) times daily.  Dispense: 60 capsule; Refill: 0  2. Elevated blood pressure reading without diagnosis of hypertension ***  3. Emotional Eating Behavior *** - escitalopram (LEXAPRO) 10 MG tablet; Take 1 tablet (10 mg total) by mouth daily.  Dispense: 30 tablet; Refill: 0 - buPROPion (WELLBUTRIN SR) 200 MG 12 hr tablet; Take 1 tablet (200 mg total) by mouth in the morning.  Dispense: 30 tablet; Refill: 0  4. Obesity, Current BMI 25.8 Anne Lewis is currently in the action stage of change. As such, her goal is to continue with weight loss efforts. She has agreed to following a lower carbohydrate, vegetable and lean protein rich diet plan.   Exercise goals: As is.   Behavioral modification strategies: increasing lean protein intake and increasing water intake.  Anne Lewis has agreed to follow-up with our clinic in 4 weeks. She was informed of the importance of frequent follow-up visits to maximize her success with intensive lifestyle modifications for her multiple  health conditions.   Objective:   Blood pressure (!) 152/80, pulse 88, temperature 98.3 F (36.8 C), height '5\' 8"'$  (1.727 m), weight 170 lb (77.1 kg), last menstrual period 03/14/2018, SpO2 99 %. Body mass index is 25.85 kg/m.  General: Cooperative, alert, well developed, in no acute distress. HEENT: Conjunctivae and lids unremarkable. Cardiovascular: Regular rhythm.  Lungs: Normal work of breathing. Neurologic: No focal deficits.   Lab Results  Component Value Date   CREATININE 1.04 04/16/2022   BUN 23 04/16/2022   NA 135 04/16/2022   K 4.4 04/16/2022   CL 101 04/16/2022   CO2 27 04/16/2022   Lab Results  Component Value Date   ALT 53 (H) 04/16/2022   AST 31 04/16/2022   ALKPHOS 86 04/16/2022   BILITOT 0.5 04/16/2022   Lab Results  Component Value Date   HGBA1C 5.7 (H) 11/07/2021   HGBA1C 5.9 06/18/2021   HGBA1C 5.9 08/30/2019   Lab Results  Component Value Date   INSULIN 19.9 11/07/2021   Lab Results  Component Value Date   TSH 0.537 11/07/2021   Lab Results  Component Value Date   CHOL 221 (H) 11/07/2021   HDL 44 11/07/2021   LDLCALC 154 (H) 11/07/2021   LDLDIRECT 156.0 06/18/2021   TRIG 125 11/07/2021   CHOLHDL 5.0 (H) 11/07/2021   Lab Results  Component Value Date   VD25OH 69.0 04/30/2022   VD25OH 24.2 (L) 11/07/2021   VD25OH 12.59 (L) 06/18/2021   Lab Results  Component Value Date   WBC 7.6 04/16/2022   HGB 13.5 04/16/2022   HCT 40.3 04/16/2022   MCV 89.9 04/16/2022   PLT  315.0 04/16/2022   No results found for: "IRON", "TIBC", "FERRITIN"  Attestation Statements:   Reviewed by clinician on day of visit: allergies, medications, problem list, medical history, surgical history, family history, social history, and previous encounter notes.   I, Trixie Dredge, am acting as transcriptionist for Dennard Nip, MD.  I have reviewed the above documentation for accuracy and completeness, and I agree with the above. -  ***

## 2022-08-16 ENCOUNTER — Other Ambulatory Visit: Payer: Self-pay | Admitting: Family Medicine

## 2022-08-16 ENCOUNTER — Encounter: Payer: Self-pay | Admitting: Family Medicine

## 2022-08-16 ENCOUNTER — Ambulatory Visit (INDEPENDENT_AMBULATORY_CARE_PROVIDER_SITE_OTHER): Payer: 59 | Admitting: Family Medicine

## 2022-08-16 VITALS — BP 110/70 | HR 97 | Temp 98.2°F | Ht 68.0 in | Wt 172.4 lb

## 2022-08-16 DIAGNOSIS — S0340XA Sprain of jaw, unspecified side, initial encounter: Secondary | ICD-10-CM | POA: Diagnosis not present

## 2022-08-16 DIAGNOSIS — R519 Headache, unspecified: Secondary | ICD-10-CM

## 2022-08-16 LAB — CBC
HCT: 40.2 % (ref 36.0–46.0)
Hemoglobin: 13.6 g/dL (ref 12.0–15.0)
MCHC: 33.9 g/dL (ref 30.0–36.0)
MCV: 88.8 fl (ref 78.0–100.0)
Platelets: 389 10*3/uL (ref 150.0–400.0)
RBC: 4.53 Mil/uL (ref 3.87–5.11)
RDW: 13.5 % (ref 11.5–15.5)
WBC: 6.5 10*3/uL (ref 4.0–10.5)

## 2022-08-16 LAB — SEDIMENTATION RATE: Sed Rate: 28 mm/hr (ref 0–30)

## 2022-08-16 LAB — COMPREHENSIVE METABOLIC PANEL
ALT: 14 U/L (ref 0–35)
AST: 15 U/L (ref 0–37)
Albumin: 4.5 g/dL (ref 3.5–5.2)
Alkaline Phosphatase: 69 U/L (ref 39–117)
BUN: 18 mg/dL (ref 6–23)
CO2: 28 mEq/L (ref 19–32)
Calcium: 10 mg/dL (ref 8.4–10.5)
Chloride: 101 mEq/L (ref 96–112)
Creatinine, Ser: 0.88 mg/dL (ref 0.40–1.20)
GFR: 73.3 mL/min (ref >60.00)
Glucose, Bld: 78 mg/dL (ref 70–99)
Potassium: 4.4 mEq/L (ref 3.5–5.1)
Sodium: 137 mEq/L (ref 135–145)
Total Bilirubin: 0.4 mg/dL (ref 0.2–1.2)
Total Protein: 7.4 g/dL (ref 6.0–8.3)

## 2022-08-16 LAB — C-REACTIVE PROTEIN: CRP: <1 mg/dL (ref 0.5–20.0)

## 2022-08-16 MED ORDER — PREDNISONE 10 MG PO TABS: ORAL_TABLET | ORAL | 0 refills | Status: DC

## 2022-08-16 NOTE — Progress Notes (Signed)
Established Patient Office Visit   Subjective:  Patient ID: Anne Lewis, female    DOB: 05/03/1966  Age: 57 y.o. MRN: [id number]  Chief Complaint  Patient presents with   Ear Pain    Left ear still hurting feels full, headache, cold sweats symptoms x 2 weeks antibiotics did not help.     HPI Encounter Diagnoses  Name Primary?   Sprain of temporomandibular joint, initial encounter Yes   Temporal headache    2-week history of persistent left-sided nonprogressive headache.  And actually wraps around her head but is seem to be worse on the left.  Intermittently there is blurred vision.  This is sometimes correlated with elevated blood sugars but not necessarily.  There is no nausea or vomiting.  Has felt warm but there have been no fevers or chills.  At this time sinuses seem to be quiet.  There is no congestion rhinorrhea or postnasal drip that she is noticed.  Hearing has been unaffected.  She does have a history of bruxism.  History of migraine.   Review of Systems  Constitutional: Negative.  Negative for chills, fever, malaise/fatigue and weight loss.  HENT:  Positive for ear pain. Negative for congestion, ear discharge, hearing loss, nosebleeds and sinus pain.   Eyes:  Negative for blurred vision, discharge and redness.  Respiratory: Negative.    Cardiovascular: Negative.   Gastrointestinal:  Negative for abdominal pain.  Genitourinary: Negative.   Musculoskeletal: Negative.  Negative for myalgias.  Skin:  Negative for rash.  Neurological:  Positive for headaches. Negative for dizziness, tingling, loss of consciousness and weakness.  Endo/Heme/Allergies:  Negative for polydipsia.     Current Outpatient Medications:    buPROPion (WELLBUTRIN SR) 200 MG 12 hr tablet, Take 1 tablet (200 mg total) by mouth in the morning., Disp: 30 tablet, Rfl: 0   escitalopram (LEXAPRO) 10 MG tablet, Take 1 tablet (10 mg total) by mouth daily., Disp: 30 tablet, Rfl: 0   hyoscyamine (LEVSIN  SL) 0.125 MG SL tablet, Place 1 tablet (0.125 mg total) under the tongue every 6 (six) hours as needed for cramping., Disp: 30 tablet, Rfl: 6   naproxen sodium (ALEVE) 220 MG tablet, Take 440 mg by mouth daily as needed., Disp: , Rfl:    omeprazole (PRILOSEC) 40 MG capsule, Take 1 capsule (40 mg total) by mouth 2 (two) times daily., Disp: 60 capsule, Rfl: 0   ondansetron (ZOFRAN) 4 MG tablet, Take 1 tablet (4 mg total) by mouth every 8 (eight) hours as needed for nausea or vomiting., Disp: 20 tablet, Rfl: 0   predniSONE (DELTASONE) 10 MG tablet, Take 1 tablet (10 mg total) by mouth daily with breakfast for 7 days, THEN 0.5 tablets (5 mg total) daily with breakfast for 7 days, THEN 0.5 tablets (5 mg total) daily with breakfast for 7 days., Disp: 14 tablet, Rfl: 0   promethazine (PHENERGAN) 25 MG tablet, Take 1 tablet (25 mg total) by mouth every 8 (eight) hours as needed for nausea or vomiting., Disp: 30 tablet, Rfl: 0   Vitamin D, Ergocalciferol, (DRISDOL) 1.25 MG (50000 UNIT) CAPS capsule, Take 1 capsule (50,000 Units total) by mouth every 7 (seven) days., Disp: 12 capsule, Rfl: 0   ZOLMitriptan (ZOMIG) 2.5 MG tablet, Take 1 tablet (2.5 mg total) by mouth once for 1 dose. May repeat in 2 hours if headache persists or recurs., Disp: 10 tablet, Rfl: 1   Objective:     BP 110/70 (BP Location: Left Arm, Patient Position:  Sitting, Cuff Size: Normal)   Pulse 97   Temp 98.2 F (36.8 C) (Temporal)   Ht '5\' 8"'$  (1.727 m)   Wt 172 lb 6.4 oz (78.2 kg)   LMP 03/14/2018   SpO2 99%   BMI 26.21 kg/m  Wt Readings from Last 3 Encounters:  08/16/22 172 lb 6.4 oz (78.2 kg)  08/07/22 170 lb (77.1 kg)  08/02/22 175 lb (79.4 kg)      Physical Exam Constitutional:      General: She is not in acute distress.    Appearance: Normal appearance. She is not ill-appearing, toxic-appearing or diaphoretic.  HENT:     Head: Normocephalic and atraumatic.      Comments: 1.     Right Ear: Tympanic membrane, ear  canal and external ear normal.     Left Ear: Tympanic membrane, ear canal and external ear normal.     Mouth/Throat:     Mouth: Mucous membranes are moist.     Pharynx: Oropharynx is clear. No oropharyngeal exudate or posterior oropharyngeal erythema.  Eyes:     General: No scleral icterus.       Right eye: No discharge.        Left eye: No discharge.     Extraocular Movements: Extraocular movements intact.     Conjunctiva/sclera: Conjunctivae normal.  Cardiovascular:     Rate and Rhythm: Normal rate and regular rhythm.  Pulmonary:     Effort: Pulmonary effort is normal. No respiratory distress.  Skin:    General: Skin is warm and dry.  Neurological:     Mental Status: She is alert and oriented to person, place, and time.  Psychiatric:        Mood and Affect: Mood normal.        Behavior: Behavior normal.      No results found for any visits on 08/16/22.    The 10-year ASCVD risk score (Arnett DK, et al., 2019) is: 5.2%    Assessment & Plan:   Sprain of temporomandibular joint, initial encounter -     predniSONE; Take 1 tablet (10 mg total) by mouth daily with breakfast for 7 days, THEN 0.5 tablets (5 mg total) daily with breakfast for 7 days, THEN 0.5 tablets (5 mg total) daily with breakfast for 7 days.  Dispense: 14 tablet; Refill: 0  Temporal headache -     Sedimentation rate -     C-reactive protein -     predniSONE; Take 1 tablet (10 mg total) by mouth daily with breakfast for 7 days, THEN 0.5 tablets (5 mg total) daily with breakfast for 7 days, THEN 0.5 tablets (5 mg total) daily with breakfast for 7 days.  Dispense: 14 tablet; Refill: 0 -     CBC -     Comprehensive metabolic panel -     Interleukin-6, Serum    Return in about 2 weeks (around 08/30/2022).    Libby Maw, MD

## 2022-08-19 ENCOUNTER — Telehealth (INDEPENDENT_AMBULATORY_CARE_PROVIDER_SITE_OTHER): Payer: Self-pay | Admitting: Family Medicine

## 2022-08-19 NOTE — Telephone Encounter (Signed)
Patient called and stated she needs her medication sent to another pharmacy. The medication Bupropion 200. [facility] on [address]. If you have any questions, please call patient

## 2022-08-21 NOTE — Telephone Encounter (Signed)
Patient called and stated she needs her medication sent to another pharmacy. The medication Bupropion 200. [facility] on [address]. If you have any questions, please call patient

## 2022-08-22 ENCOUNTER — Other Ambulatory Visit (INDEPENDENT_AMBULATORY_CARE_PROVIDER_SITE_OTHER): Payer: Self-pay | Admitting: Family Medicine

## 2022-08-22 ENCOUNTER — Encounter (INDEPENDENT_AMBULATORY_CARE_PROVIDER_SITE_OTHER): Payer: Self-pay | Admitting: Family Medicine

## 2022-08-22 DIAGNOSIS — F3289 Other specified depressive episodes: Secondary | ICD-10-CM

## 2022-08-22 LAB — INTERLEUKIN-6, SERUM  IL6SL: Interleukin-6, Serum: 3 pg/mL (ref 0.0–13.0)

## 2022-08-22 MED ORDER — BUPROPION HCL ER (SR) 200 MG PO TB12: 200.0000 mg | ORAL_TABLET | Freq: Every morning | ORAL | 0 refills | Status: DC

## 2022-08-26 MED ORDER — BUPROPION HCL ER (SR) 200 MG PO TB12: 200.0000 mg | ORAL_TABLET | Freq: Every morning | ORAL | 0 refills | Status: DC

## 2022-08-27 ENCOUNTER — Other Ambulatory Visit (HOSPITAL_COMMUNITY)
Admission: RE | Admit: 2022-08-27 | Discharge: 2022-08-27 | Disposition: A | Discharge status: Home / Self Care | Payer: 59 | Source: Ambulatory Visit | Attending: Nurse Practitioner | Admitting: Nurse Practitioner

## 2022-08-27 ENCOUNTER — Ambulatory Visit (INDEPENDENT_AMBULATORY_CARE_PROVIDER_SITE_OTHER): Payer: 59 | Admitting: Nurse Practitioner

## 2022-08-27 ENCOUNTER — Encounter: Payer: Self-pay | Admitting: Nurse Practitioner

## 2022-08-27 VITALS — BP 110/70 | HR 88 | Ht 67.75 in | Wt 167.0 lb

## 2022-08-27 DIAGNOSIS — Z124 Encounter for screening for malignant neoplasm of cervix: Secondary | ICD-10-CM | POA: Diagnosis present

## 2022-08-27 DIAGNOSIS — Z01419 Encounter for gynecological examination (general) (routine) without abnormal findings: Secondary | ICD-10-CM | POA: Diagnosis not present

## 2022-08-27 DIAGNOSIS — N951 Menopausal and female climacteric states: Secondary | ICD-10-CM | POA: Diagnosis not present

## 2022-08-27 DIAGNOSIS — R350 Frequency of micturition: Secondary | ICD-10-CM | POA: Diagnosis not present

## 2022-08-27 LAB — NO CULTURE INDICATED

## 2022-08-27 LAB — URINALYSIS, COMPLETE W/RFL CULTURE
Bacteria, UA: NONE SEEN /HPF
Glucose, UA: NEGATIVE
Hgb urine dipstick: NEGATIVE
Hyaline Cast: NONE SEEN /LPF
Leukocyte Esterase: NEGATIVE
Nitrites, Initial: NEGATIVE
RBC / HPF: NONE SEEN /HPF (ref 0–2)
Specific Gravity, Urine: 1.035 (ref 1.001–1.035)
WBC, UA: NONE SEEN /HPF (ref 0–5)
pH: 5 (ref 5.0–8.0)

## 2022-08-27 MED ORDER — VEOZAH 45 MG PO TABS: 1.0000 | ORAL_TABLET | Freq: Every day | ORAL | 2 refills | Status: DC

## 2022-08-27 NOTE — Progress Notes (Signed)
Anne Lewis [birth year] [id number]   History:  57 y.o. G3P3003 presents as new patient to establish care. Postmenopausal - no HRT, no bleeding. Complains of significant hot flashes that have been much worse recently and they are affecting her daily life. Complains of urinary frequency and difficulty emptying bladder. Frequency has improved recently but has to push to fully empty bladder. H/O chronic constipation. 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 H/O DM, migraines. MGM (47 yo), mother (26 yo), sister (48), maternal cousin (44 yo) with history of ovarian cancer, they have not done genetic test but patient is BRCA negative.   Gynecologic History Patient's last menstrual period was 03/14/2018.   Contraception/Family planning: post menopausal status Sexually active: No  Health Maintenance Last Pap: 10/20/2013. Results were: Normal neg HPV Last mammogram: 06/22/2021. Results were: Normal Last colonoscopy: 12/13/2009. Results were: Normal Last Dexa: Not indicated  Past medical history, past surgical history, family history and social history were all reviewed and documented in the EPIC chart. Separated. Government social research officer. 3 children ages 62, 60 and 25. 3 grandchildren.   ROS:  A ROS was performed and pertinent positives and negatives are included.  Exam:  Vitals:   08/28/55 1417  BP: 110/70  Pulse: 88  SpO2: 99%  Weight: 167 lb (75.8 kg)  Height: 5' 7.75" (1.721 m)   Body mass index is 25.58 kg/m.  General appearance:  Normal Thyroid:  Symmetrical, normal in size, without palpable masses or nodularity. Respiratory  Auscultation:  Clear without wheezing or rhonchi Cardiovascular  Auscultation:  Regular rate, without rubs, murmurs or gallops  Edema/varicosities:  Not grossly evident Abdominal  Soft,nontender, without masses, guarding or rebound.  Liver/spleen:  No organomegaly noted  Hernia:  None appreciated   Skin  Inspection:  Grossly normal Breasts: Examined lying and sitting.   Right: Without masses, retractions, nipple discharge or axillary adenopathy.   Left: Without masses, retractions, nipple discharge or axillary adenopathy. Genitourinary   Inguinal/mons:  Normal without inguinal adenopathy  External genitalia:  Normal appearing vulva with no masses, tenderness, or lesions  BUS/Urethra/Skene's glands:  Normal  Vagina:  Normal appearing with normal color and discharge, no lesions  Cervix:  Normal appearing without discharge or lesions  Uterus:  Normal in size, shape and contour.  Midline and mobile, nontender  Adnexa/parametria:     Rt: Normal in size, without masses or tenderness.   Lt: Normal in size, without masses or tenderness.  Anus and perineum: Normal  Digital rectal exam: Normal sphincter tone without palpated masses or tenderness  Patient informed chaperone available to be present for breast and pelvic exam. Patient has requested no chaperone to be present. Patient has been advised what will be completed during breast and pelvic exam.   UA negative  Assessment/Plan:  57 y.o. G3P3003 to establish care.   Well female exam with routine gynecological exam - Education provided on SBEs, importance of preventative screenings, current guidelines, high calcium diet, regular exercise, and multivitamin daily.  Labs done elsewhere.   Urinary frequency - Plan: Urinalysis,Complete w/RFL Culture - UA unremarkable. Discussed pelvic floor weakness, causes and management. Will do pelvic floor strengthening exercises at home and continue to work on constipation prevention. If no improvement pelvic floor PT recommended.   Encounter for Papanicolaou smear for cervical cancer screening - Plan: Cytology - PAP( [place]). Normal pap history.   Hot flashes due to menopause - Plan: Fezolinetant (VEOZAH) 45 MG TABS  daily. Discussed new FDA approved medication for management of hot flashes. Normal  LFTs 08/16/22 and aware to return for recheck every 3 months for a year if she continues use. Aware this will be sent to specialty pharmacy.   Screening for breast cancer - Normal mammogram history. Overdue, scheduled this month. Normal breast exam today.  Screening for colon cancer - Overdue. Seeing GI but unable to schedule colonoscopy due to recent GI issues and inability to complete prep.   Return in 1 year for annual.     Tamela Gammon DNP, 3:01 PM 08/27/2022

## 2022-08-29 LAB — CYTOLOGY - PAP
Comment: NEGATIVE
Diagnosis: NEGATIVE
High risk HPV: NEGATIVE

## 2022-09-02 NOTE — Progress Notes (Unsigned)
TeleHealth Visit:  This visit was completed with telemedicine (audio/video) technology. Anne Lewis has verbally consented to this TeleHealth visit. The patient is located at home, the provider is located at home. The participants in this visit include the listed provider and patient. The visit was conducted today via MyChart video.  OBESITY Anne Lewis is here to discuss her progress with her obesity treatment plan along with follow-up of her obesity related diagnoses.   Today's visit was # 18 Starting weight: 207 lbs Starting date: 11/07/2021 Weight at last in office visit: 170 lbs on 08/07/22 Total weight loss: 37 lbs at last in office visit on 08/07/22. Today's reported weight: *** lbs No weight reported.  Nutrition Plan: following a lower carbohydrate, vegetable and lean protein rich diet plan.   Current exercise: {exercise types:16438} treadmill and weight machine for 60 minutes 4-5 times per week.   Interim History:  ***  Assessment/Plan:  1. ***  2. ***  3. ***  Obesity: Current BMI *** Anne Lewis {CHL AMB IS/IS NOT:210130109} currently in the action stage of change. As such, her goal is to {MWMwtloss#1:210800005}.  She has agreed to {MWMwtlossportion/plan2:23431}.   Exercise goals: {MWM EXERCISE RECS:23473}  Behavioral modification strategies: {MWMwtlossdietstrategies3:23432}.  Anne Lewis has agreed to follow-up with our clinic in {NUMBER 1-10:22536} weeks.   No orders of the defined types were placed in this encounter.   There are no discontinued medications.   No orders of the defined types were placed in this encounter.     Objective:   VITALS: Per patient if applicable, see vitals. GENERAL: Alert and in no acute distress. CARDIOPULMONARY: No increased WOB. Speaking in clear sentences.  PSYCH: Pleasant and cooperative. Speech normal rate and rhythm. Affect is appropriate. Insight and judgement are appropriate. Attention is focused, linear, and appropriate.   NEURO: Oriented as arrived to appointment on time with no prompting.   Lab Results  Component Value Date   CREATININE 0.88 08/16/2022   BUN 18 08/16/2022   NA 137 08/16/2022   K 4.4 08/16/2022   CL 101 08/16/2022   CO2 28 08/16/2022   Lab Results  Component Value Date   ALT 14 08/16/2022   AST 15 08/16/2022   ALKPHOS 69 08/16/2022   BILITOT 0.4 08/16/2022   Lab Results  Component Value Date   HGBA1C 5.7 (H) 11/07/2021   HGBA1C 5.9 06/18/2021   HGBA1C 5.9 08/30/2019   Lab Results  Component Value Date   INSULIN 19.9 11/07/2021   Lab Results  Component Value Date   TSH 0.537 11/07/2021   Lab Results  Component Value Date   CHOL 221 (H) 11/07/2021   HDL 44 11/07/2021   LDLCALC 154 (H) 11/07/2021   LDLDIRECT 156.0 06/18/2021   TRIG 125 11/07/2021   CHOLHDL 5.0 (H) 11/07/2021   Lab Results  Component Value Date   WBC 6.5 08/16/2022   HGB 13.6 08/16/2022   HCT 40.2 08/16/2022   MCV 88.8 08/16/2022   PLT 389.0 08/16/2022   No results found for: "IRON", "TIBC", "FERRITIN" Lab Results  Component Value Date   VD25OH 69.0 04/30/2022   VD25OH 24.2 (L) 11/07/2021   VD25OH 12.59 (L) 06/18/2021    Attestation Statements:   Reviewed by clinician on day of visit: allergies, medications, problem list, medical history, surgical history, family history, social history, and previous encounter notes.  ***(delete if time-based billing not used) Time spent on visit including the items listed below was *** minutes.  -preparing to see the patient (e.g., review of tests, history,  previous notes) -obtaining and/or reviewing separately obtained history -counseling and educating the patient/family/caregiver -documenting clinical information in the electronic or other health record -ordering medications, tests, or procedures -independently interpreting results and communicating results to the patient/ family/caregiver -referring and communicating with other health care  professionals  -care coordination   This was prepared with the assistance of [employer].  Occasional wrong-word or sound-a-like substitutions may have occurred due to the inherent limitations of voice recognition software.

## 2022-09-03 ENCOUNTER — Encounter (INDEPENDENT_AMBULATORY_CARE_PROVIDER_SITE_OTHER): Payer: Self-pay | Admitting: Family Medicine

## 2022-09-03 ENCOUNTER — Telehealth (INDEPENDENT_AMBULATORY_CARE_PROVIDER_SITE_OTHER): Payer: 59 | Admitting: Family Medicine

## 2022-09-03 DIAGNOSIS — K2100 Gastro-esophageal reflux disease with esophagitis, without bleeding: Secondary | ICD-10-CM

## 2022-09-03 DIAGNOSIS — E559 Vitamin D deficiency, unspecified: Secondary | ICD-10-CM | POA: Diagnosis not present

## 2022-09-03 DIAGNOSIS — R112 Nausea with vomiting, unspecified: Secondary | ICD-10-CM

## 2022-09-03 DIAGNOSIS — Z6825 Body mass index (BMI) 25.0-25.9, adult: Secondary | ICD-10-CM

## 2022-09-03 DIAGNOSIS — L659 Nonscarring hair loss, unspecified: Secondary | ICD-10-CM

## 2022-09-03 DIAGNOSIS — E669 Obesity, unspecified: Secondary | ICD-10-CM

## 2022-09-03 MED ORDER — OMEPRAZOLE 40 MG PO CPDR: 40.0000 mg | DELAYED_RELEASE_CAPSULE | Freq: Two times a day (BID) | ORAL | 0 refills | Status: DC

## 2022-09-03 MED ORDER — ONDANSETRON HCL 4 MG PO TABS: 4.0000 mg | ORAL_TABLET | Freq: Three times a day (TID) | ORAL | 1 refills | Status: DC | PRN

## 2022-09-03 MED ORDER — VITAMIN D (ERGOCALCIFEROL) 1.25 MG (50000 UNIT) PO CAPS: 50000.0000 [IU] | ORAL_CAPSULE | ORAL | 0 refills | Status: DC

## 2022-09-10 ENCOUNTER — Ambulatory Visit
Admission: RE | Admit: 2022-09-10 | Discharge: 2022-09-10 | Disposition: A | Discharge status: Home / Self Care | Payer: 59 | Source: Ambulatory Visit | Attending: Nurse Practitioner | Admitting: Nurse Practitioner

## 2022-09-10 DIAGNOSIS — Z1231 Encounter for screening mammogram for malignant neoplasm of breast: Secondary | ICD-10-CM

## 2022-09-13 ENCOUNTER — Other Ambulatory Visit: Payer: Self-pay | Admitting: Nurse Practitioner

## 2022-09-13 ENCOUNTER — Other Ambulatory Visit (INDEPENDENT_AMBULATORY_CARE_PROVIDER_SITE_OTHER): Payer: Self-pay | Admitting: Family Medicine

## 2022-09-13 DIAGNOSIS — R928 Other abnormal and inconclusive findings on diagnostic imaging of breast: Secondary | ICD-10-CM

## 2022-09-13 DIAGNOSIS — F3289 Other specified depressive episodes: Secondary | ICD-10-CM

## 2022-09-23 ENCOUNTER — Ambulatory Visit: Payer: 59 | Admitting: Advanced Practice Midwife

## 2022-09-24 ENCOUNTER — Encounter (INDEPENDENT_AMBULATORY_CARE_PROVIDER_SITE_OTHER): Payer: Self-pay | Admitting: Family Medicine

## 2022-09-24 ENCOUNTER — Ambulatory Visit (INDEPENDENT_AMBULATORY_CARE_PROVIDER_SITE_OTHER): Payer: 59 | Admitting: Family Medicine

## 2022-09-24 VITALS — BP 139/87 | HR 83 | Temp 98.7°F | Ht 68.0 in | Wt 159.0 lb

## 2022-09-24 DIAGNOSIS — E559 Vitamin D deficiency, unspecified: Secondary | ICD-10-CM

## 2022-09-24 DIAGNOSIS — R112 Nausea with vomiting, unspecified: Secondary | ICD-10-CM

## 2022-09-24 DIAGNOSIS — K2100 Gastro-esophageal reflux disease with esophagitis, without bleeding: Secondary | ICD-10-CM | POA: Diagnosis not present

## 2022-09-24 DIAGNOSIS — Z6824 Body mass index (BMI) 24.0-24.9, adult: Secondary | ICD-10-CM

## 2022-09-24 DIAGNOSIS — F3289 Other specified depressive episodes: Secondary | ICD-10-CM

## 2022-09-24 DIAGNOSIS — E669 Obesity, unspecified: Secondary | ICD-10-CM

## 2022-09-24 MED ORDER — OMEPRAZOLE 40 MG PO CPDR: 40.0000 mg | DELAYED_RELEASE_CAPSULE | Freq: Two times a day (BID) | ORAL | 0 refills | Status: DC

## 2022-09-24 MED ORDER — BUPROPION HCL ER (SR) 200 MG PO TB12: 200.0000 mg | ORAL_TABLET | Freq: Every morning | ORAL | 0 refills | Status: DC

## 2022-09-24 MED ORDER — ONDANSETRON HCL 4 MG PO TABS: 4.0000 mg | ORAL_TABLET | Freq: Three times a day (TID) | ORAL | 1 refills | Status: DC | PRN

## 2022-09-24 MED ORDER — VITAMIN D (ERGOCALCIFEROL) 1.25 MG (50000 UNIT) PO CAPS: 50000.0000 [IU] | ORAL_CAPSULE | ORAL | 0 refills | Status: DC

## 2022-09-29 ENCOUNTER — Other Ambulatory Visit (INDEPENDENT_AMBULATORY_CARE_PROVIDER_SITE_OTHER): Payer: Self-pay | Admitting: Family Medicine

## 2022-09-29 DIAGNOSIS — F3289 Other specified depressive episodes: Secondary | ICD-10-CM

## 2022-09-30 ENCOUNTER — Ambulatory Visit: Admission: RE | Admit: 2022-09-30 | Payer: 59 | Source: Ambulatory Visit

## 2022-09-30 ENCOUNTER — Ambulatory Visit
Admission: RE | Admit: 2022-09-30 | Discharge: 2022-09-30 | Disposition: A | Discharge status: Home / Self Care | Payer: 59 | Source: Ambulatory Visit | Attending: Nurse Practitioner | Admitting: Nurse Practitioner

## 2022-09-30 DIAGNOSIS — R928 Other abnormal and inconclusive findings on diagnostic imaging of breast: Secondary | ICD-10-CM

## 2022-10-07 ENCOUNTER — Other Ambulatory Visit (INDEPENDENT_AMBULATORY_CARE_PROVIDER_SITE_OTHER): Payer: Self-pay | Admitting: Family Medicine

## 2022-10-07 DIAGNOSIS — K2100 Gastro-esophageal reflux disease with esophagitis, without bleeding: Secondary | ICD-10-CM

## 2022-10-08 NOTE — Progress Notes (Unsigned)
Chief Complaint:   OBESITY Anne Lewis is here to discuss her progress with her obesity treatment plan along with follow-up of her obesity related diagnoses. Anne Lewis is on following a lower carbohydrate, vegetable and lean protein rich diet plan and states she is following her eating plan approximately 90% of the time. Anne Lewis states she is walking and lifting weights for 60 minutes 7 times per week.  Today's visit was #: 51 Starting weight: 207 lbs Starting date: 11/07/2021 Today's weight: 159 lbs Today's date: 09/24/2022 Total lbs lost to date: 48 Total lbs lost since last in-office visit: 11  Interim History: Anne Lewis has lost a lot of weight in the last 2 months.  She has been dealing with nausea on a daily basis with frequent vomiting.  She has an upcoming appointment with GI.  Subjective:   1. Nausea and vomiting in adult Anne Lewis is seeing her GI soon.  She is on multiple medications but no improvement.  2. Vitamin D deficiency Anne Lewis is on vitamin D, and her level has been at goal but she is having increased nausea.  This may be a sign of over replacement.  3. Gastroesophageal reflux disease with esophagitis without hemorrhage Anne Lewis has GERD with esophagitis.  She is following up with GI, and she needs a Prilosec refill.  4. Emotional Eating Behavior Anne Lewis is on Wellbutrin, and she is working on decreasing emotional eating behaviors.  Her blood pressure is stable but a bit elevated today.  Assessment/Plan:   1. Nausea and vomiting in adult Anne Lewis agreed to discontinue metformin and will discontinue Phenergan.  We will refill Zofran for 2 months.  She is to have GI take over her therapy.  - ondansetron (ZOFRAN) 4 MG tablet; Take 1 tablet (4 mg total) by mouth every 8 (eight) hours as needed for nausea or vomiting.  Dispense: 30 tablet; Refill: 1  2. Vitamin D deficiency [name] agreed to discontinue vitamin D until labs are repeated.  3. Gastroesophageal  reflux disease with esophagitis without hemorrhage We will refill Prilosec for 1 month.  Anne Lewis is to have GI take over her treatment.  - omeprazole (PRILOSEC) 40 MG capsule; Take 1 capsule (40 mg total) by mouth 2 (two) times daily.  Dispense: 60 capsule; Refill: 0  4. Emotional Eating Behavior We will refill Wellbutrin SR for 1 month, and we will recheck her blood pressure in 1 to 2 months.  - buPROPion (WELLBUTRIN SR) 200 MG 12 hr tablet; Take 1 tablet (200 mg total) by mouth in the morning.  Dispense: 30 tablet; Refill: 0  5. Obesity with starting BMI of 31.48  6. Current BMI 24.18 Anne Lewis is currently in the action stage of change. As such, her goal is to continue with weight loss efforts. She has agreed to practicing portion control and making smarter food choices, such as increasing vegetables and decreasing simple carbohydrates.   Exercise goals: As is.   Behavioral modification strategies: increasing lean protein intake and increasing water intake.  Anne Lewis has agreed to follow-up with our clinic in 4 weeks. She was informed of the importance of frequent follow-up visits to maximize her success with intensive lifestyle modifications for her multiple health conditions.   Objective:   Blood pressure 139/87, pulse 83, temperature 98.7 F (37.1 C), height 5\' 8"  (1.727 m), weight 159 lb (72.1 kg), last menstrual period 03/14/2018, SpO2 100 %. Body mass index is 24.18 kg/m.  Lab Results  Component Value Date   CREATININE 0.88 08/16/2022  BUN 18 08/16/2022   NA 137 08/16/2022   K 4.4 08/16/2022   CL 101 08/16/2022   CO2 28 08/16/2022   Lab Results  Component Value Date   ALT 14 08/16/2022   AST 15 08/16/2022   ALKPHOS 69 08/16/2022   BILITOT 0.4 08/16/2022   Lab Results  Component Value Date   HGBA1C 5.7 (H) 11/07/2021   HGBA1C 5.9 06/18/2021   HGBA1C 5.9 08/30/2019   Lab Results  Component Value Date   INSULIN 19.9 11/07/2021   Lab Results  Component  Value Date   TSH 0.537 11/07/2021   Lab Results  Component Value Date   CHOL 221 (H) 11/07/2021   HDL 44 11/07/2021   LDLCALC 154 (H) 11/07/2021   LDLDIRECT 156.0 06/18/2021   TRIG 125 11/07/2021   CHOLHDL 5.0 (H) 11/07/2021   Lab Results  Component Value Date   VD25OH 69.0 04/30/2022   VD25OH 24.2 (L) 11/07/2021   VD25OH 12.59 (L) 06/18/2021   Lab Results  Component Value Date   WBC 6.5 08/16/2022   HGB 13.6 08/16/2022   HCT 40.2 08/16/2022   MCV 88.8 08/16/2022   PLT 389.0 08/16/2022   No results found for: "IRON", "TIBC", "FERRITIN"  Attestation Statements:   Reviewed by clinician on day of visit: allergies, medications, problem list, medical history, surgical history, family history, social history, and previous encounter notes.  I have personally spent 40 minutes total time today in preparation, patient care, and documentation for this visit, including the following: review of clinical lab tests; review of medical tests/procedures/services.   I, Trixie Dredge, am acting as transcriptionist for Dennard Nip, MD.  I have reviewed the above documentation for accuracy and completeness, and I agree with the above. -  Dennard Nip, MD

## 2022-10-09 ENCOUNTER — Encounter: Payer: Self-pay | Admitting: Gastroenterology

## 2022-10-10 ENCOUNTER — Telehealth: Payer: Self-pay

## 2022-10-10 NOTE — Telephone Encounter (Signed)
Appt scheduled 10/15/22 at 11:30am.

## 2022-10-10 NOTE — Telephone Encounter (Signed)
Yes, needs OV. Ok to schedule next week.

## 2022-10-10 NOTE — Telephone Encounter (Signed)
Patient had screening mammo 09/01/22 and Diagnostic mammo 10/11/22 and was cleared to return in one year.  3 days ago she discovered a lump "under my arm near rib cage".  Recommend OV to assess? (No appts available today.)

## 2022-10-10 NOTE — Telephone Encounter (Signed)
Spoke with patient and informed her. Message sent to appt desk to call and schedule visit.

## 2022-10-14 NOTE — Progress Notes (Signed)
  TeleHealth Visit:  This visit was completed with telemedicine (audio/video) technology. [name] has verbally consented to this TeleHealth visit. The patient is located at home, the provider is located at home. The participants in this visit include the listed provider and patient. The visit was conducted today via MyChart video.  OBESITY Anne Lewis is here to discuss her progress with her obesity treatment plan along with follow-up of her obesity related diagnoses.   Today's visit was # 20 Starting weight: 207 lbs Starting date: 11/07/2021 Weight at last in office visit: 159 lbs on 09/24/22 Total weight loss: 48 lbs at last in office visit on 09/24/22. Today's reported weight: {dwwweightreported:29243}  walking and lifting weights for 60 minutes 7 times per week.  Nutrition Plan: practicing portion control and making smarter food choices, such as increasing vegetables and decreasing simple carbohydrates - ***% adherence.  Current exercise: {exercise types:16438} walking and lifting weights for 60 minutes 7 times per week  Interim History:  ***  Protein intake adequate: {yes***/no:17258} Skipping meals: {dwwyes:29172} Drinking adequate water: {dwwyes:29172} Drinking sugar sweetened beverages: {dwwyes:29172} Hunger controlled: {EWCONTROLASSESSMENT:24261}. Cravings controlled:  {EWCONTROLASSESSMENT:24261}.  Journaling Consistently:  {dwwyes:29172} Meeting protein goals:  {dwwyes:29172} Meeting calorie goals:  {dwwyes:29172}   Pharmacotherapy: [name] is on {dwwpharmacotherapy:29109} Adverse side effects: {dwwse:29122} Hunger is {EWCONTROLASSESSMENT:24261}.  Cravings are {EWCONTROLASSESSMENT:24261}.  Assessment/Plan:  1. ***  2. ***  3. ***  {dwwmorbid:29108::"Morbid Obesity"}: Current BMI *** Pharmacotherapy Plan {dwwmed:29123}  {dwwpharmacotherapy:29109} [name] {CHL AMB IS/IS NOT:210130109} currently in the action stage of change. As such, her goal is to  {MWMwtloss#1:210800005}.  She has agreed to {dwwsldiets:29085}.  Exercise goals: {MWM EXERCISE RECS:23473}  Behavioral modification strategies: {dwwslwtlossstrategies:29088}.  [name] has agreed to follow-up with our clinic in {NUMBER 1-10:22536} weeks.   No orders of the defined types were placed in this encounter.   There are no discontinued medications.   No orders of the defined types were placed in this encounter.     Objective:   VITALS: Per patient if applicable, see vitals. GENERAL: Alert and in no acute distress. CARDIOPULMONARY: No increased WOB. Speaking in clear sentences.  PSYCH: Pleasant and cooperative. Speech normal rate and rhythm. Affect is appropriate. Insight and judgement are appropriate. Attention is focused, linear, and appropriate.  NEURO: Oriented as arrived to appointment on time with no prompting.   Attestation Statements:   Reviewed by clinician on day of visit: allergies, medications, problem list, medical history, surgical history, family history, social history, and previous encounter notes.  ***(delete if time-based billing not used) Time spent on visit including the items listed below was *** minutes.  -preparing to see the patient (e.g., review of tests, history, previous notes) -obtaining and/or reviewing separately obtained history -counseling and educating the patient/family/caregiver -documenting clinical information in the electronic or other health record -ordering medications, tests, or procedures -independently interpreting results and communicating results to the patient/ family/caregiver -referring and communicating with other health care professionals  -care coordination   This was prepared with the assistance of Presenter, broadcasting.  Occasional wrong-word or sound-a-like substitutions may have occurred due to the inherent limitations of voice recognition software.

## 2022-10-15 ENCOUNTER — Telehealth (INDEPENDENT_AMBULATORY_CARE_PROVIDER_SITE_OTHER): Payer: 59 | Admitting: Family Medicine

## 2022-10-15 ENCOUNTER — Encounter (INDEPENDENT_AMBULATORY_CARE_PROVIDER_SITE_OTHER): Payer: Self-pay | Admitting: Family Medicine

## 2022-10-15 ENCOUNTER — Encounter: Payer: Self-pay | Admitting: Nurse Practitioner

## 2022-10-15 ENCOUNTER — Ambulatory Visit (INDEPENDENT_AMBULATORY_CARE_PROVIDER_SITE_OTHER): Payer: 59 | Admitting: Nurse Practitioner

## 2022-10-15 VITALS — BP 114/70 | HR 91

## 2022-10-15 DIAGNOSIS — Z6824 Body mass index (BMI) 24.0-24.9, adult: Secondary | ICD-10-CM

## 2022-10-15 DIAGNOSIS — R590 Localized enlarged lymph nodes: Secondary | ICD-10-CM

## 2022-10-15 DIAGNOSIS — R112 Nausea with vomiting, unspecified: Secondary | ICD-10-CM

## 2022-10-15 DIAGNOSIS — F3289 Other specified depressive episodes: Secondary | ICD-10-CM

## 2022-10-15 DIAGNOSIS — M940 Chondrocostal junction syndrome [Tietze]: Secondary | ICD-10-CM | POA: Diagnosis not present

## 2022-10-15 DIAGNOSIS — E669 Obesity, unspecified: Secondary | ICD-10-CM

## 2022-10-15 DIAGNOSIS — R0789 Other chest pain: Secondary | ICD-10-CM

## 2022-10-15 MED ORDER — PROMETHAZINE HCL 12.5 MG RE SUPP: 12.5000 mg | Freq: Four times a day (QID) | RECTAL | 0 refills | Status: DC | PRN

## 2022-10-15 MED ORDER — CYCLOBENZAPRINE HCL 5 MG PO TABS: 5.0000 mg | ORAL_TABLET | Freq: Three times a day (TID) | ORAL | 0 refills | Status: DC | PRN

## 2022-10-15 MED ORDER — ESCITALOPRAM OXALATE 20 MG PO TABS: 20.0000 mg | ORAL_TABLET | Freq: Every day | ORAL | 0 refills | Status: DC

## 2022-10-15 NOTE — Progress Notes (Signed)
   Acute Office Visit  Subjective:    Patient ID: Anne Lewis, female    DOB: 1966/01/17, 57 y.o.   MRN: [id number]   HPI 57 y.o. presents today for upper back and right-sided chest pain. This started about a week ago, is intermittent, worse when picking something up, sharp, radiates from right scapula to right outer breast. Noticed a swollen right axillary lymph node a few days later, this is getting smaller and is less painful. She has been having nausea, vomiting and general malaise for a few days. Has been seeing GI for GERD and she is unsure if this is causing GI symptoms or she has a virus. Mammogram 09/10/2022 showed left breast asymmetry, follow up imaging 09/30/22 was normal. Denies known injury. Cannot take Ibuprofen.   Review of Systems  Constitutional:  Positive for fatigue.  Gastrointestinal:  Positive for nausea and vomiting. Negative for abdominal pain and diarrhea.  Musculoskeletal:  Positive for back pain.  Hematological:  Positive for adenopathy (Right axillary).  Right breast: Positive for pain. Negative for mass, nipple discharge, redness, skin changes, or swelling.  Left breast: Negative     Objective:    Physical Exam Constitutional:      Appearance: Normal appearance.  Chest:  Breasts:    Right: Tenderness present. No swelling, bleeding, inverted nipple, mass, nipple discharge or skin change.     Left: Normal.    Musculoskeletal:       Back:  Lymphadenopathy:     Upper Body:     Right upper body: Axillary adenopathy (x 1) present. No supraclavicular adenopathy.     Left upper body: No supraclavicular or axillary adenopathy.     BP 114/70   Pulse 91   LMP 03/14/2018   SpO2 99%  Wt Readings from Last 3 Encounters:  09/24/22 159 lb (72.1 kg)  08/27/22 167 lb (75.8 kg)  08/16/22 172 lb 6.4 oz (78.2 kg)        Patient informed chaperone available to be present for breast and/or pelvic exam. Patient has requested no chaperone to be present.  Patient has been advised what will be completed during breast and pelvic exam.   Assessment & Plan:   Problem List Items Addressed This Visit   None Visit Diagnoses     Acute costochondritis    -  Primary   Relevant Medications   cyclobenzaprine (FLEXERIL) 5 MG tablet   Right-sided chest wall pain       Relevant Medications   cyclobenzaprine (FLEXERIL) 5 MG tablet   Lymphadenopathy, axillary          Plan: No lesions suggestive of shingles. Normal breast exam other than tenderness along outer mid region along intercostal space. Costochondritis versus muscle injury. Recommend rest, heat, Tylenol, and Flexeril as needed. Aware may take a few weeks to fully heal. Lymph node smaller in size and less painful. Likely reactive to injury or possible GI virus. If lymph node does not fully resolve in 1-2 weeks recommend further evaluation.      Tamela Gammon DNP, 12:03 PM 10/15/2022

## 2022-10-22 ENCOUNTER — Encounter (INDEPENDENT_AMBULATORY_CARE_PROVIDER_SITE_OTHER): Payer: Self-pay | Admitting: Family Medicine

## 2022-10-22 ENCOUNTER — Ambulatory Visit (INDEPENDENT_AMBULATORY_CARE_PROVIDER_SITE_OTHER): Payer: 59 | Admitting: Family Medicine

## 2022-10-22 VITALS — BP 120/76 | HR 77 | Temp 98.5°F | Ht 68.0 in | Wt 154.0 lb

## 2022-10-22 DIAGNOSIS — Z6820 Body mass index (BMI) 20.0-20.9, adult: Secondary | ICD-10-CM | POA: Insufficient documentation

## 2022-10-22 DIAGNOSIS — E669 Obesity, unspecified: Secondary | ICD-10-CM

## 2022-10-22 DIAGNOSIS — Z6822 Body mass index (BMI) 22.0-22.9, adult: Secondary | ICD-10-CM | POA: Insufficient documentation

## 2022-10-22 DIAGNOSIS — R112 Nausea with vomiting, unspecified: Secondary | ICD-10-CM

## 2022-10-22 DIAGNOSIS — Z6823 Body mass index (BMI) 23.0-23.9, adult: Secondary | ICD-10-CM | POA: Diagnosis not present

## 2022-10-22 DIAGNOSIS — F3289 Other specified depressive episodes: Secondary | ICD-10-CM | POA: Diagnosis not present

## 2022-10-22 DIAGNOSIS — Z6821 Body mass index (BMI) 21.0-21.9, adult: Secondary | ICD-10-CM | POA: Insufficient documentation

## 2022-10-22 MED ORDER — BUPROPION HCL ER (SR) 200 MG PO TB12: 200.0000 mg | ORAL_TABLET | Freq: Every morning | ORAL | 0 refills | Status: DC

## 2022-10-22 MED ORDER — ONDANSETRON HCL 4 MG PO TABS
4.0000 mg | ORAL_TABLET | Freq: Three times a day (TID) | ORAL | 0 refills | Status: DC | PRN
Start: 2022-10-22 — End: 2022-11-18

## 2022-10-22 NOTE — Progress Notes (Unsigned)
Chief Complaint:   OBESITY Anne Lewis is here to discuss her progress with her obesity treatment plan along with follow-up of her obesity related diagnoses. Anne Lewis is on practicing portion control and making smarter food choices, such as increasing vegetables and decreasing simple carbohydrates and states she is following her eating plan approximately 80% of the time. Anne Lewis states she is walking for 60 minutes 6 times per week.  Today's visit was #: 21 Starting weight: 207 lbs Starting date: 11/07/2021 Today's weight: 154 lbs Today's date: 10/22/2022 Total lbs lost to date: 22 Total lbs lost since last in-office visit: 5  Interim History: Anne Lewis continues to do well with her weight loss. She was recently diagnosed with diverticulitis. She was given restrictions on certain vegetables and seeds.   Subjective:   1. Nausea and vomiting in adult Anne Lewis is still having occasional residual nausea as she is recovering from diverticulitis. She requests a refill of Zofran. She was encouraged to only take it when really needed.   2. Emotional Eating Behavior Anne Lewis has done well with decreasing emotional eating behavior, and she is tolerating Wellbutrin with no side effects.   Assessment/Plan:   1. Nausea and vomiting in adult Anne Lewis will continue Zofran, and we will refill for 1 month.   - ondansetron (ZOFRAN) 4 MG tablet; Take 1 tablet (4 mg total) by mouth every 8 (eight) hours as needed for nausea or vomiting.  Dispense: 15 tablet; Refill: 0  2. Emotional Eating Behavior Anne Lewis will continue Wellbutrin SR, and we will refill for 1 month.   - buPROPion (WELLBUTRIN SR) 200 MG 12 hr tablet; Take 1 tablet (200 mg total) by mouth in the morning.  Dispense: 30 tablet; Refill: 0  3. BMI 23.0-23.9, adult  4. Obesity, Beginning BMI 31.47 Anne Lewis is currently in the action stage of change. As such, her goal is to continue with weight loss efforts. She has agreed to keeping a  food journal and adhering to recommended goals of 1400-1700 calories and 100+ grams of protein daily.   Anne Lewis is to increase her calories to maintain her weight. She was encouraged to increase healthy fats.   Exercise goals: As is.   Behavioral modification strategies: increasing lean protein intake and increasing water intake.  Anne Lewis has agreed to follow-up with our clinic in 4 weeks. She was informed of the importance of frequent follow-up visits to maximize her success with intensive lifestyle modifications for her multiple health conditions.   Objective:   Blood pressure 120/76, pulse 77, temperature 98.5 F (36.9 C), height 5\' 8"  (1.727 m), weight 154 lb (69.9 kg), last menstrual period 03/14/2018, SpO2 100 %. Body mass index is 23.42 kg/m.  Lab Results  Component Value Date   CREATININE 0.88 08/16/2022   BUN 18 08/16/2022   NA 137 08/16/2022   K 4.4 08/16/2022   CL 101 08/16/2022   CO2 28 08/16/2022   Lab Results  Component Value Date   ALT 14 08/16/2022   AST 15 08/16/2022   ALKPHOS 69 08/16/2022   BILITOT 0.4 08/16/2022   Lab Results  Component Value Date   HGBA1C 5.7 (H) 11/07/2021   HGBA1C 5.9 06/18/2021   HGBA1C 5.9 08/30/2019   Lab Results  Component Value Date   INSULIN 19.9 11/07/2021   Lab Results  Component Value Date   TSH 0.537 11/07/2021   Lab Results  Component Value Date   CHOL 221 (H) 11/07/2021   HDL 44 11/07/2021   LDLCALC 154 (H)  11/07/2021   LDLDIRECT 156.0 06/18/2021   TRIG 125 11/07/2021   CHOLHDL 5.0 (H) 11/07/2021   Lab Results  Component Value Date   VD25OH 69.0 04/30/2022   VD25OH 24.2 (L) 11/07/2021   VD25OH 12.59 (L) 06/18/2021   Lab Results  Component Value Date   WBC 6.5 08/16/2022   HGB 13.6 08/16/2022   HCT 40.2 08/16/2022   MCV 88.8 08/16/2022   PLT 389.0 08/16/2022   No results found for: "IRON", "TIBC", "FERRITIN"  Attestation Statements:   Reviewed by clinician on day of visit: allergies,  medications, problem list, medical history, surgical history, family history, social history, and previous encounter notes.   I, Trixie Dredge, am acting as transcriptionist for Dennard Nip, MD.  I have reviewed the above documentation for accuracy and completeness, and I agree with the above. -  Dennard Nip, MD

## 2022-10-25 ENCOUNTER — Ambulatory Visit (INDEPENDENT_AMBULATORY_CARE_PROVIDER_SITE_OTHER): Payer: 59 | Admitting: Family Medicine

## 2022-10-25 VITALS — BP 120/68 | HR 86 | Temp 98.6°F | Ht 68.0 in | Wt 157.6 lb

## 2022-10-25 DIAGNOSIS — R0789 Other chest pain: Secondary | ICD-10-CM

## 2022-10-25 MED ORDER — NAPROXEN 500 MG PO TABS
500.0000 mg | ORAL_TABLET | Freq: Two times a day (BID) | ORAL | 0 refills | Status: DC
Start: 2022-10-25 — End: 2023-10-07

## 2022-10-25 NOTE — Progress Notes (Signed)
[facility] [address] [place] [id number] Dept: [phone] Dept Fax: [phone]  Office Visit  Subjective:    Patient ID: Anne BruinsLissette M Lewis, female    DOB: 1965/09/27, 57 y.o..   MRN: [id number]  Chief Complaint  Patient presents with   Flank Pain    C/o having swollen lymph node in Rt arm and pain in the rib cage.    History of Present Illness:  Patient is in today complaining of a 2 week history of a painful "lymph node" in right chest/axilla area. She notes this seemed to develop suddenly. She could feel a swollen lump near the right axilla. She went to see her gynecologist. She had a recent mammogram which was normal. The GYN noted a swollen lymph node, but was not able to determine an etiology, so asked her to follow up with her PCP.  Ms. Lolita LenzMarchany denies any recent fever. She has no rashes or injuries to the right arm or right chest wall.   Past Medical History: Patient Active Problem List   Diagnosis Date Noted   BMI 23.0-23.9, adult 10/22/2022   Gastroesophageal reflux disease with esophagitis without hemorrhage 08/08/2022   Elevated blood pressure reading without diagnosis of hypertension 08/08/2022   Nausea and vomiting in adult 04/30/2022   Depression 04/30/2022   Generalized obesity 04/30/2022   Depression with anxiety 03/19/2022   Facial cellulitis 01/24/2022   Ecchymosis 12/11/2021   Rash 12/11/2021   Pre-diabetes 12/11/2021   Acute appendicitis 09/03/2021   Perimenopausal 06/18/2021   Vitamin D deficiency 06/18/2021   Intractable headache 06/18/2021   Sciatica of right side 01/11/2021   Paresthesias 08/30/2019   Weight gain 08/30/2019   Dysthymia 08/30/2019   Reactive airway disease 10/06/2018   Osteoarthritis of spine with radiculopathy, cervical region 04/16/2018   Urticaria 04/16/2018   Tooth pain 04/16/2018   Acute non-recurrent sinusitis 08/19/2017   Lateral epicondylitis of left elbow  08/19/2017   Cough 08/19/2017   Healthcare maintenance 08/19/2017   Family history of malignant neoplasm of breast 11/08/2013   Family history of malignant neoplasm of gastrointestinal tract 11/08/2013   Family history of malignant neoplasm of ovary 11/08/2013   RUQ PAIN 08/08/2010   DYSPEPSIA&OTHER SPEC DISORDERS FUNCTION STOMACH 08/07/2010   ABDOMINAL PAIN, EPIGASTRIC 08/07/2010   OTHER CONSTIPATION 12/05/2009   Past Surgical History:  Procedure Laterality Date   CESAREAN SECTION  [id number]   ENDOMETRIAL CRYOABLATION  12.19.2012   HER OPTION - office performed   LAPAROSCOPIC APPENDECTOMY N/A 09/03/2021   Procedure: APPENDECTOMY LAPAROSCOPIC;  Surgeon: Axel Filleramirez, Armando, MD;  Location: [facility];  Service: General;  Laterality: N/A;   TONSILLECTOMY  1987   Family History  Problem Relation Age of Onset   Diabetes Mother    Hypertension Mother    Cancer Mother [phone]       intestine and ovarian   Diverticulitis Mother    Depression Mother    Anxiety disorder Mother    Sleep apnea Mother    Stroke Father    Diabetes Father    Hypertension Father    Heart disease Father    Heart attack Father        x 2   Cancer Sister [phone]       ovarian and intestine   Cancer Brother 24       GI cancer   Breast cancer Maternal Aunt [phone]   Cancer Paternal Aunt [phone]       stomach   Cancer  Maternal Grandmother 81       ovarian and pancreatic   Cancer Cousin 39       ovarian   Cancer Cousin 47       breast   Outpatient Medications Prior to Visit  Medication Sig Dispense Refill   buPROPion (WELLBUTRIN SR) 200 MG 12 hr tablet Take 1 tablet (200 mg total) by mouth in the morning. 30 tablet 0   cyclobenzaprine (FLEXERIL) 5 MG tablet Take 1 tablet (5 mg total) by mouth 3 (three) times daily as needed for muscle spasms. 30 tablet 0   escitalopram (LEXAPRO) 20 MG tablet Take 1 tablet (20 mg total) by mouth daily. 30 tablet 0   Fezolinetant (VEOZAH) 45 MG TABS Take 1 tablet (45 mg total) by mouth daily at 6  (six) AM. 30 tablet 2   hyoscyamine (LEVSIN SL) 0.125 MG SL tablet Place 1 tablet (0.125 mg total) under the tongue every 6 (six) hours as needed for cramping. 30 tablet 6   naproxen sodium (ALEVE) 220 MG tablet Take 440 mg by mouth daily as needed.     omeprazole (PRILOSEC) 40 MG capsule Take 1 capsule (40 mg total) by mouth 2 (two) times daily. 60 capsule 0   ondansetron (ZOFRAN) 4 MG tablet Take 1 tablet (4 mg total) by mouth every 8 (eight) hours as needed for nausea or vomiting. 15 tablet 0   promethazine (PHENERGAN) 12.5 MG suppository Place 1 suppository (12.5 mg total) rectally every 6 (six) hours as needed for nausea or vomiting. 12 each 0   ZOLMitriptan (ZOMIG) 2.5 MG tablet Take 1 tablet (2.5 mg total) by mouth once for 1 dose. May repeat in 2 hours if headache persists or recurs. 10 tablet 1   No facility-administered medications prior to visit.   Allergies  Allergen Reactions   Aspirin Swelling and Other (See Comments)    Bleeding   Augmentin [Amoxicillin-Pot Clavulanate] Swelling   Oxycodone Nausea And Vomiting     Objective:   Today's Vitals   10/25/22 1256  BP: 120/68  Pulse: 86  Temp: 98.6 F (37 C)  TempSrc: Temporal  SpO2: 99%  Weight: 157 lb 9.6 oz (71.5 kg)  Height: 5\' 8"  (1.727 m)   Body mass index is 23.96 kg/m.   General: Well developed, well nourished. No acute distress. Chest: There is an area of tenderness to the right chest wall below the axilla. There is no obvious palpable mass or   nodule.  Lungs: Clear to auscultation bilaterally. No wheezing, rales or rhonchi. Extremities: Full ROM of RUE. No joint swelling or tenderness.  Psych: Alert and oriented. Normal mood and affect.  Health Maintenance Due  Topic Date Due   HIV Screening  Never done   Hepatitis C Screening  Never done     Assessment & Plan:   Problem List Items Addressed This Visit   None Visit Diagnoses     Right-sided chest wall pain    -  Primary   No obvious lymph node or  mass. Suspect chest wall pain. I recommend she apply heat to this area daily. I will treat her with a 1-week course of naproxen.   Relevant Medications   naproxen (NAPROSYN) 500 MG tablet       Return in about 1 week (around 11/01/2022), or if symptoms worsen or fail to improve.   Loyola Mast, MD

## 2022-11-07 ENCOUNTER — Ambulatory Visit (AMBULATORY_SURGERY_CENTER): Payer: 59 | Admitting: *Deleted

## 2022-11-07 VITALS — Ht 68.0 in | Wt 155.0 lb

## 2022-11-07 DIAGNOSIS — Z800 Family history of malignant neoplasm of digestive organs: Secondary | ICD-10-CM

## 2022-11-07 MED ORDER — NA SULFATE-K SULFATE-MG SULF 17.5-3.13-1.6 GM/177ML PO SOLN
1.0000 | Freq: Once | ORAL | 0 refills | Status: AC
Start: 2022-11-07 — End: 2022-11-07

## 2022-11-07 MED ORDER — ONDANSETRON HCL 4 MG PO TABS
ORAL_TABLET | ORAL | 0 refills | Status: DC
Start: 2022-11-07 — End: 2022-11-18

## 2022-11-07 NOTE — Progress Notes (Signed)
No egg or soy allergy known to patient  No issues known to pt with past sedation with any surgeries or procedures Patient denies ever being told they had issues or difficulty with intubation  No FH of Malignant Hyperthermia Pt is not on diet pills Pt is not on  home 02  Pt is not on blood thinners  Pt has issues with constipation ,take miralax,2 day prep given No A fib or A flutter Have any cardiac testing pending--NO Pt instructed to use Single[employer]com or GoodRx for a price reduction on prep    Pt.concerned about nausea and vomiting zofran 4 mg given to take 30-60 minutes prior to each prep dose.  Patient's chart reviewed by Cathlyn Parsons CNRA prior to previsit and patient appropriate for the [facility].  Previsit completed and red dot placed by patient's name on their procedure day (on provider's schedule).

## 2022-11-11 ENCOUNTER — Other Ambulatory Visit (INDEPENDENT_AMBULATORY_CARE_PROVIDER_SITE_OTHER): Payer: Self-pay | Admitting: Family Medicine

## 2022-11-11 DIAGNOSIS — K2100 Gastro-esophageal reflux disease with esophagitis, without bleeding: Secondary | ICD-10-CM

## 2022-11-11 DIAGNOSIS — F3289 Other specified depressive episodes: Secondary | ICD-10-CM

## 2022-11-14 NOTE — Progress Notes (Signed)
TeleHealth Visit:  This visit was completed with telemedicine (audio/video) technology. Anne Lewis has verbally consented to this TeleHealth visit. The patient is located at home, the provider is located at home. The participants in this visit include the listed provider and patient. The visit was conducted today via MyChart video.  OBESITY Anne Lewis is here to discuss her progress with her obesity treatment plan along with follow-up of her obesity related diagnoses.   Today's visit was # 22 Starting weight: 207 lbs Starting date: 11/07/2021 Weight at last in office visit: 154 lbs on 10/22/22 Total weight loss: 53 lbs at last in office visit on 10/22/22. Today's reported weight (11/18/22):  147 lbs- unsure if accurate  Nutrition Plan: keeping a food journal with goal of 1400-1700 calories and 100 grams of protein daily -  Current exercise:  walking for 60+ minutes 6 times per week   Interim History:  She is feeling better overall but still has episodes of nausea and vomiting. Shortness of breath has improved. Has coloscopy scheduled for May 14. She does not want to lose more weight.  She notes early satiety.  She has mostly cut out dairy.  She is averaging about 1400 calories per day and 80-100 protein per day.  She is avoiding food such as grains, nuts, seeds due to diverticulitis.  Drinking adequate water. Assessment/Plan:  1. Gastroesophageal reflux disease without esophagitis Has had frequent nausea and vomiting over the past several months.  She was diagnosed with diverticulitis.  Will have a colonoscopy May 14.  She will be on liquids for 4 days prior. Her symptoms have finally started improving. She has not needed to take Zofran recently.  Plan: Refill omeprazole 40 mg twice daily. Follow-up with GI as recommended.  2. Other depression/emotional eating No issues with emotional eating currently.  Has noted some depression due to poor health over the past several months..   Denies suicidal/homicidal ideation. Reports she has had so many doctors appointments she has not had time for therapy.  Overall mood is stable. Medication(s): Bupropion 200 mg every morning, Lexapro 20 mg daily.  Plan: Refill Lexapro 20 mg daily. Refill bupropion 200 mg daily in the morning. Discussed resuming therapy and she will consider it.    3. Generalized Obesity: Current BMI 23  [name] is currently in the action stage of change. As such, her goal is to maintain weight for now.  She has agreed to keeping a food journal with goal of 1400-1700 calories and 100 grams of protein daily.  1.  Have frequent small meals throughout the day. 2.  Discussed options for adding in healthy fats.  Exercise goals: Add in resistance training 2-3 times weekly.  Behavioral modification strategies: increasing lean protein intake and planning for success.  [name] has agreed to follow-up with our clinic in 4 weeks.   No orders of the defined types were placed in this encounter.   Medications Discontinued During This Encounter  Medication Reason   omeprazole (PRILOSEC) 40 MG capsule Reorder   buPROPion (WELLBUTRIN SR) 200 MG 12 hr tablet Reorder   ondansetron (ZOFRAN) 4 MG tablet Patient Preference   ondansetron (ZOFRAN) 4 MG tablet Patient Preference   promethazine (PHENERGAN) 12.5 MG suppository Patient Preference   Fezolinetant (VEOZAH) 45 MG TABS Patient Preference   escitalopram (LEXAPRO) 20 MG tablet Reorder     Meds ordered this encounter  Medications   buPROPion (WELLBUTRIN SR) 200 MG 12 hr tablet    Sig: Take 1 tablet (200 mg total) by  mouth in the morning.    Dispense:  90 tablet    Refill:  0    Order Specific Question:   Supervising Provider    Answer:   Glennis Brink [[id number]]   omeprazole (PRILOSEC) 40 MG capsule    Sig: Take 1 capsule (40 mg total) by mouth 2 (two) times daily.    Dispense:  180 capsule    Refill:  0    Order Specific Question:   Supervising Provider     Answer:   Glennis Brink [[id number]]   escitalopram (LEXAPRO) 20 MG tablet    Sig: Take 1 tablet (20 mg total) by mouth daily.    Dispense:  90 tablet    Refill:  0    Order Specific Question:   Supervising Provider    Answer:   Glennis Brink [[id number]]      Objective:   VITALS: Per patient if applicable, see vitals. GENERAL: Alert and in no acute distress. CARDIOPULMONARY: No increased WOB. Speaking in clear sentences.  PSYCH: Pleasant and cooperative. Speech normal rate and rhythm. Affect is appropriate. Insight and judgement are appropriate. Attention is focused, linear, and appropriate.  NEURO: Oriented as arrived to appointment on time with no prompting.   Attestation Statements:   Reviewed by clinician on day of visit: allergies, medications, problem list, medical history, surgical history, family history, social history, and previous encounter notes.   This was prepared with the assistance of Engineer, civil (consulting).  Occasional wrong-word or sound-a-like substitutions may have occurred due to the inherent limitations of voice recognition software.

## 2022-11-18 ENCOUNTER — Encounter (INDEPENDENT_AMBULATORY_CARE_PROVIDER_SITE_OTHER): Payer: Self-pay | Admitting: Family Medicine

## 2022-11-18 ENCOUNTER — Telehealth (INDEPENDENT_AMBULATORY_CARE_PROVIDER_SITE_OTHER): Payer: 59 | Admitting: Family Medicine

## 2022-11-18 DIAGNOSIS — F3289 Other specified depressive episodes: Secondary | ICD-10-CM | POA: Diagnosis not present

## 2022-11-18 DIAGNOSIS — Z6823 Body mass index (BMI) 23.0-23.9, adult: Secondary | ICD-10-CM | POA: Diagnosis not present

## 2022-11-18 DIAGNOSIS — E669 Obesity, unspecified: Secondary | ICD-10-CM | POA: Diagnosis not present

## 2022-11-18 DIAGNOSIS — K2100 Gastro-esophageal reflux disease with esophagitis, without bleeding: Secondary | ICD-10-CM

## 2022-11-18 MED ORDER — OMEPRAZOLE 40 MG PO CPDR: 40.0000 mg | DELAYED_RELEASE_CAPSULE | Freq: Two times a day (BID) | ORAL | 0 refills | Status: DC

## 2022-11-18 MED ORDER — BUPROPION HCL ER (SR) 200 MG PO TB12: 200.0000 mg | ORAL_TABLET | Freq: Every morning | ORAL | 0 refills | Status: DC

## 2022-11-18 MED ORDER — ESCITALOPRAM OXALATE 20 MG PO TABS: 20.0000 mg | ORAL_TABLET | Freq: Every day | ORAL | 0 refills | Status: DC

## 2022-11-25 ENCOUNTER — Encounter: Payer: Self-pay | Admitting: Gastroenterology

## 2022-12-03 ENCOUNTER — Ambulatory Visit (AMBULATORY_SURGERY_CENTER): Payer: 59 | Admitting: Gastroenterology

## 2022-12-03 ENCOUNTER — Encounter: Payer: Self-pay | Admitting: Gastroenterology

## 2022-12-03 VITALS — BP 105/71 | HR 85 | Temp 98.1°F | Resp 15 | Ht 68.0 in | Wt 155.0 lb

## 2022-12-03 DIAGNOSIS — Z1211 Encounter for screening for malignant neoplasm of colon: Secondary | ICD-10-CM | POA: Diagnosis present

## 2022-12-03 DIAGNOSIS — Z800 Family history of malignant neoplasm of digestive organs: Secondary | ICD-10-CM

## 2022-12-03 DIAGNOSIS — D122 Benign neoplasm of ascending colon: Secondary | ICD-10-CM | POA: Diagnosis not present

## 2022-12-03 MED ORDER — SODIUM CHLORIDE 0.9 % IV SOLN
500.0000 mL | INTRAVENOUS | Status: DC
Start: 2022-12-03 — End: 2022-12-03

## 2022-12-03 NOTE — Patient Instructions (Signed)
Handouts on hemorrhoids, polyps, and diverticulosis given to patient. Await pathology results. Resume previous diet and continue present medications. Anticipate repeat colonoscopy for surveillance in 5 years given family history of colon cancer.    YOU HAD AN ENDOSCOPIC PROCEDURE TODAY AT THE [facility]:   Refer to the procedure report that was given to you for any specific questions about what was found during the examination.  If the procedure report does not answer your questions, please call your gastroenterologist to clarify.  If you requested that your care partner not be given the details of your procedure findings, then the procedure report has been included in a sealed envelope for you to review at your convenience later.  YOU SHOULD EXPECT: Some feelings of bloating in the abdomen. Passage of more gas than usual.  Walking can help get rid of the air that was put into your GI tract during the procedure and reduce the bloating. If you had a lower endoscopy (such as a colonoscopy or flexible sigmoidoscopy) you may notice spotting of blood in your stool or on the toilet paper. If you underwent a bowel prep for your procedure, you may not have a normal bowel movement for a few days.  Please Note:  You might notice some irritation and congestion in your nose or some drainage.  This is from the oxygen used during your procedure.  There is no need for concern and it should clear up in a day or so.  SYMPTOMS TO REPORT IMMEDIATELY:  Following lower endoscopy (colonoscopy or flexible sigmoidoscopy):  Excessive amounts of blood in the stool  Significant tenderness or worsening of abdominal pains  Swelling of the abdomen that is new, acute  Fever of 100F or higher  For urgent or emergent issues, a gastroenterologist can be reached at any hour by calling [phone]. Do not use MyChart messaging for urgent concerns.    DIET:  We do recommend a small meal at first, but then you  may proceed to your regular diet.  Drink plenty of fluids but you should avoid alcoholic beverages for 24 hours.  ACTIVITY:  You should plan to take it easy for the rest of today and you should NOT DRIVE or use heavy machinery until tomorrow (because of the sedation medicines used during the test).    FOLLOW UP: Our staff will call the number listed on your records the next business day following your procedure.  We will call around 7:15- 8:00 am to check on you and address any questions or concerns that you may have regarding the information given to you following your procedure. If we do not reach you, we will leave a message.     If any biopsies were taken you will be contacted by phone or by letter within the next 1-3 weeks.  Please call us at [phone] if you have not heard about the biopsies in 3 weeks.    SIGNATURES/CONFIDENTIALITY: You and/or your care partner have signed paperwork which will be entered into your electronic medical record.  These signatures attest to the fact that that the information above on your After Visit Summary has been reviewed and is understood.  Full responsibility of the confidentiality of this discharge information lies with you and/or your care-partner.

## 2022-12-03 NOTE — Progress Notes (Signed)
[facility] History and Physical   Primary Care Physician:  Mliss Sax, MD   Reason for Procedure:   Family history of colon cancer  Plan:    colonoscopy     HPI: Anne Lewis is a 57 y.o. female  here for colonoscopy screening - sister had colon cancer age 35s. Last colonoscopy > 10 years ago. She has chronic constipation.  Otherwise feels well without any cardiopulmonary symptoms.   I have discussed risks / benefits of anesthesia and endoscopic procedure with Anne Lewis and they wish to proceed with the exams as outlined today.    Past Medical History:  Diagnosis Date   Allergy    anemia    Anemia    Anxiety    Aortic atherosclerosis (HCC)    Arthritis    B12 deficiency    Back pain    BRCA negative 11/2013   Chest pain    Constipation    Depression    Fatty liver    Gallbladder problem    GERD (gastroesophageal reflux disease)    H pylori ulcer    Heartburn    Joint pain    Migraines    Palpitation    Pre-diabetes    Seizures (HCC)    3 YEARS AGO UPDATED 11/07/22   sleep apnea    Stomach ulcer    Ulcer of gastric fundus    Vitamin D deficiency     Past Surgical History:  Procedure Laterality Date   CESAREAN SECTION  6045,4098   COLONOSCOPY     ENDOMETRIAL CRYOABLATION  07/10/2011   HER OPTION - office performed   LAPAROSCOPIC APPENDECTOMY N/A 09/03/2021   Procedure: APPENDECTOMY LAPAROSCOPIC;  Surgeon: Axel Filler, MD;  Location: [facility];  Service: General;  Laterality: N/A;   TONSILLECTOMY  07/22/1985    Prior to Admission medications   Medication Sig Start Date End Date Taking? Authorizing Provider  buPROPion (WELLBUTRIN SR) 200 MG 12 hr tablet Take 1 tablet (200 mg total) by mouth in the morning. 11/18/22  Yes Whitmire, Dawn W, FNP  diphenhydrAMINE HCl (BENADRYL ALLERGY PO) Take by mouth as needed.   Yes [provider]  omeprazole (PRILOSEC) 40 MG capsule Take 1 capsule (40 mg total) by mouth 2  (two) times daily. 11/18/22  Yes Whitmire, Dawn W, FNP  Cholecalciferol (VITAMIN D-3 PO) Take 50,000 Units by mouth once a week.    [provider]  cyclobenzaprine (FLEXERIL) 5 MG tablet Take 1 tablet (5 mg total) by mouth 3 (three) times daily as needed for muscle spasms. 10/15/22   Wyline Beady A, NP  escitalopram (LEXAPRO) 20 MG tablet Take 1 tablet (20 mg total) by mouth daily. 11/18/22   Whitmire, Thermon Leyland, FNP  hyoscyamine (LEVSIN SL) 0.125 MG SL tablet Place 1 tablet (0.125 mg total) under the tongue every 6 (six) hours as needed for cramping. 11/22/21   Rachael Fee, MD  naproxen (NAPROSYN) 500 MG tablet Take 1 tablet (500 mg total) by mouth 2 (two) times daily with a meal. Patient taking differently: Take 500 mg by mouth daily. 10/25/22   Loyola Mast, MD  ZOLMitriptan (ZOMIG) 2.5 MG tablet Take 1 tablet (2.5 mg total) by mouth once for 1 dose. May repeat in 2 hours if headache persists or recurs. 10/11/21 11/07/97  Mliss Sax, MD    Current Outpatient Medications  Medication Sig Dispense Refill   buPROPion [facility] SR) 200 MG 12 hr tablet Take 1 tablet (200 mg  total) by mouth in the morning. 90 tablet 0   diphenhydrAMINE HCl (BENADRYL ALLERGY PO) Take by mouth as needed.     omeprazole (PRILOSEC) 40 MG capsule Take 1 capsule (40 mg total) by mouth 2 (two) times daily. 180 capsule 0   Cholecalciferol (VITAMIN D-3 PO) Take 50,000 Units by mouth once a week.     cyclobenzaprine (FLEXERIL) 5 MG tablet Take 1 tablet (5 mg total) by mouth 3 (three) times daily as needed for muscle spasms. 30 tablet 0   escitalopram (LEXAPRO) 20 MG tablet Take 1 tablet (20 mg total) by mouth daily. 90 tablet 0   hyoscyamine (LEVSIN SL) 0.125 MG SL tablet Place 1 tablet (0.125 mg total) under the tongue every 6 (six) hours as needed for cramping. 30 tablet 6   naproxen (NAPROSYN) 500 MG tablet Take 1 tablet (500 mg total) by mouth 2 (two) times daily with a meal. (Patient taking  differently: Take 500 mg by mouth daily.) 14 tablet 0   ZOLMitriptan (ZOMIG) 2.5 MG tablet Take 1 tablet (2.5 mg total) by mouth once for 1 dose. May repeat in 2 hours if headache persists or recurs. 10 tablet 1   Current Facility-Administered Medications  Medication Dose Route Frequency Provider Last Rate Last Admin   0.9 %  sodium chloride infusion  500 mL Intravenous Continuous Shaquasia Caponigro, Willaim Rayas, MD        Allergies as of 12/03/2022 - Review Complete 12/03/2022  Allergen Reaction Noted   Aspirin Swelling and Other (See Comments) 05/28/2011   Augmentin [amoxicillin-pot clavulanate] Swelling 01/03/2018   Oxycodone Nausea And Vomiting 01/03/2018    Family History  Problem Relation Age of Onset   Colon polyps Mother    Colon cancer Mother    Diabetes Mother    Hypertension Mother    Cancer Mother 70       intestine and ovarian   Diverticulitis Mother    Depression Mother    Anxiety disorder Mother    Sleep apnea Mother    Stroke Father    Diabetes Father    Hypertension Father    Heart disease Father    Heart attack Father        x 2   Colon cancer Sister    Colon polyps Sister    Rectal cancer Sister    Cancer Sister 78       ovarian and intestine   Cancer Brother 24       GI cancer   Breast cancer Maternal Aunt 81   Cancer Paternal Aunt 65       stomach   Cancer Maternal Grandmother 18       ovarian and pancreatic   Cancer Cousin 39       ovarian   Cancer Cousin 47       breast   Crohn's disease Neg Hx    Esophageal cancer Neg Hx    Stomach cancer Neg Hx    Ulcerative colitis Neg Hx     Social History   Socioeconomic History   Marital status: Married    Spouse name: Marine scientist   Number of children: Not on file   Years of education: Not on file   Highest education level: Bachelor's degree (e.g., BA, AB, BS)  Occupational History   Occupation: Oceanographer  Tobacco Use   Smoking status: Some Days    Types: Cigarettes   Smokeless  tobacco: Never  Vaping Use   Vaping Use: Never used  Substance  and Sexual Activity   Alcohol use: Yes    Comment: occ   Drug use: No   Sexual activity: Not Currently    Partners: Male    Birth control/protection: Post-menopausal, Abstinence  Other Topics Concern   Not on file  Social History Narrative   Not on file   Social Determinants of Health   Financial Resource Strain: High Risk (10/23/2022)   Overall Financial Resource Strain (CARDIA)    Difficulty of Paying Living Expenses: Hard  Food Insecurity: Food Insecurity Present (10/23/2022)   Hunger Vital Sign    Worried About Running Out of Food in the Last Year: Sometimes true    Ran Out of Food in the Last Year: Often true  Transportation Needs: No Transportation Needs (10/23/2022)   PRAPARE - Administrator, Civil Service (Medical): No    Lack of Transportation (Non-Medical): No  Physical Activity: Sufficiently Active (10/23/2022)   Exercise Vital Sign    Days of Exercise per Week: 4 days    Minutes of Exercise per Session: 60 min  Stress: No Stress Concern Present (10/23/2022)   [employer] of Occupational Health - Occupational Stress Questionnaire    Feeling of Stress : Only a little  Social Connections: Moderately Isolated (10/23/2022)   Social Connection and Isolation Panel [NHANES]    Frequency of Communication with Friends and Family: More than three times a week    Frequency of Social Gatherings with Friends and Family: Twice a week    Attends Religious Services: 1 to 4 times per year    Active Member of [employer] or Organizations: No    Attends Engineer, structural: Not on file    Marital Status: Separated  Intimate Partner Violence: Not on file    Review of Systems: All other review of systems negative except as mentioned in the HPI.  Physical Exam: Vital signs BP 93/64   Pulse 91   Temp 98.1 F (36.7 C) (Skin)   Ht 5\' 8"  (1.727 m)   Wt 155 lb (70.3 kg)   LMP 03/14/2018   SpO2 100%    BMI 23.57 kg/m   General:   Alert,  Well-developed, pleasant and cooperative in NAD Lungs:  Clear throughout to auscultation.   Heart:  Regular rate and rhythm Abdomen:  Soft, nontender and nondistended.   Neuro/Psych:  Alert and cooperative. Normal mood and affect. A and O x 3  Harlin Rain, MD [facility]

## 2022-12-03 NOTE — Op Note (Signed)
[facility] Patient Name: Anne Lewis Procedure Date: 12/03/2022 7:51 AM MRN: [id number] Endoscopist: Viviann Spare P. Adela Lank , MD, [id number] Age: 57 Referring MD:  Date of Birth: 10-03-1965 Gender: Female Account #: [id number] Procedure:                Colonoscopy Indications:              Screening in patient at increased risk: Family                            history of 1st-degree relative with colorectal                            cancer (sister dx age 31s) Medicines:                Monitored Anesthesia Care Procedure:                Pre-Anesthesia Assessment:                           - Prior to the procedure, a History and Physical                            was performed, and patient medications and                            allergies were reviewed. The patient's tolerance of                            previous anesthesia was also reviewed. The risks                            and benefits of the procedure and the sedation                            options and risks were discussed with the patient.                            All questions were answered, and informed consent                            was obtained. Prior Anticoagulants: The patient has                            taken no anticoagulant or antiplatelet agents. ASA                            Grade Assessment: II - A patient with mild systemic                            disease. After reviewing the risks and benefits,                            the patient was deemed in satisfactory condition to  undergo the procedure.                           After obtaining informed consent, the colonoscope                            was passed under direct vision. Throughout the                            procedure, the patient's blood pressure, pulse, and                            oxygen saturations were monitored continuously. The                            Olympus PCF-H190DL  [id number]) Colonoscope was                            introduced through the anus and advanced to the the                            cecum, identified by appendiceal orifice and                            ileocecal valve. The colonoscopy was performed                            without difficulty. The patient tolerated the                            procedure well. The quality of the bowel                            preparation was good. The ileocecal valve,                            appendiceal orifice, and rectum were photographed. Scope In: 8:07:36 AM Scope Out: 8:26:15 AM Scope Withdrawal Time: 0 hours 13 minutes 59 seconds  Total Procedure Duration: 0 hours 18 minutes 39 seconds  Findings:                 Skin tags were found on perianal exam.                           A 4 mm polyp was found in the ascending colon. The                            polyp was sessile. The polyp was removed with a                            cold snare. Resection and retrieval were complete.                           Multiple small-mouthed diverticula were found in  the sigmoid colon.                           Internal hemorrhoids were found during                            retroflexion. The hemorrhoids were small.                           The exam was otherwise without abnormality. Complications:            No immediate complications. Estimated blood loss:                            Minimal. Estimated Blood Loss:     Estimated blood loss was minimal. Impression:               - Perianal skin tags found on perianal exam.                           - One 4 mm polyp in the ascending colon, removed                            with a cold snare. Resected and retrieved.                           - Diverticulosis in the sigmoid colon.                           - Internal hemorrhoids.                           - The examination was otherwise normal.                           - The GI  Genius (intelligent endoscopy module),                            computer-aided polyp detection system powered by AI                            was utilized to detect colorectal polyps through                            enhanced visualization during colonoscopy. Recommendation:           - Patient has a contact number available for                            emergencies. The signs and symptoms of potential                            delayed complications were discussed with the                            patient. Return to normal activities tomorrow.  Written discharge instructions were provided to the                            patient.                           - Resume previous diet.                           - Continue present medications.                           - Await pathology results. Anticipate repeat                            colonoscopy in 5 years given strong family history                            of colon cancer. Viviann Spare P. Demetress Tift, MD 12/03/2022 8:33:18 AM This report has been signed electronically.

## 2022-12-03 NOTE — Progress Notes (Signed)
Called to room to assist during endoscopic procedure.  Patient ID and intended procedure confirmed with present staff. Received instructions for my participation in the procedure from the performing physician.  

## 2022-12-03 NOTE — Progress Notes (Signed)
Pt. states no medical or surgical changes since previsit or office visit. 

## 2022-12-04 ENCOUNTER — Telehealth: Payer: Self-pay

## 2022-12-04 NOTE — Telephone Encounter (Signed)
Left message to call if having any issues or concerns, B.Linnie Delgrande RN. 

## 2022-12-18 ENCOUNTER — Ambulatory Visit (INDEPENDENT_AMBULATORY_CARE_PROVIDER_SITE_OTHER): Payer: 59 | Admitting: Family Medicine

## 2022-12-18 ENCOUNTER — Encounter (INDEPENDENT_AMBULATORY_CARE_PROVIDER_SITE_OTHER): Payer: Self-pay | Admitting: Family Medicine

## 2022-12-18 VITALS — BP 99/63 | HR 83 | Temp 98.2°F | Ht 68.0 in | Wt 146.0 lb

## 2022-12-18 DIAGNOSIS — Z6822 Body mass index (BMI) 22.0-22.9, adult: Secondary | ICD-10-CM

## 2022-12-18 DIAGNOSIS — K5792 Diverticulitis of intestine, part unspecified, without perforation or abscess without bleeding: Secondary | ICD-10-CM | POA: Diagnosis not present

## 2022-12-18 DIAGNOSIS — E669 Obesity, unspecified: Secondary | ICD-10-CM

## 2022-12-18 DIAGNOSIS — K921 Melena: Secondary | ICD-10-CM | POA: Diagnosis not present

## 2022-12-23 NOTE — Progress Notes (Unsigned)
Chief Complaint:   OBESITY Anne Lewis is here to discuss her progress with her obesity treatment plan along with follow-up of her obesity related diagnoses. Shery is on keeping a food journal and adhering to recommended goals of 1400-1700 calories and 100 grams of protein and states she is following her eating plan approximately 90% of the time. Noriko states she is walking and at the gym for 60-120 minutes 6 times per week.  Today's visit was #: 23 Starting weight: 207 lbs Starting date: 11/07/2021 Today's weight: 146 lbs Today's date: 12/18/2022 Total lbs lost to date: 61 Total lbs lost since last in-office visit: 8  Interim History: Patient has done well with her diet and weight loss.  She had a colonoscopy with a longer prep and has lost weight during this time.  She continues to work on increasing her protein.  Subjective:   1. Diverticulitis Patient was advised to limit seeds but increase fiber.  She had significant GI distress after eating popcorn.  2. Melena Patient notes increased GERD and melena since her recent colonoscopy with polyp removal.  Assessment/Plan:   1. Diverticulitis We discussed foods that are rich in fiber.  Patient will continue OTC MiraLAX per GI.  2. Melena Patient was encouraged to discuss this with her GI doctor today, and patient agreed.  3. BMI 22.0-22.9, adult  4. Obesity, Beginning BMI 31.47 Anne Lewis is currently in the action stage of change. As such, her goal is to maintain weight for now. She has agreed to keeping a food journal and adhering to recommended goals of 1700 calories and 100+ grams of protein daily.   Patient agreed she is not trying to lose further weight.  She will increase her protein and calories.  We will recheck her weight in 1 month.  Exercise goals: As is.   Behavioral modification strategies: increasing lean protein intake.  Amai has agreed to follow-up with our clinic in 4 weeks. She was informed of the  importance of frequent follow-up visits to maximize her success with intensive lifestyle modifications for her multiple health conditions.   Objective:   Blood pressure 99/63, pulse 83, temperature 98.2 F (36.8 C), height 5\' 8"  (1.727 m), weight 146 lb (66.2 kg), last menstrual period 03/14/2018, SpO2 100 %. Body mass index is 22.2 kg/m.  Lab Results  Component Value Date   CREATININE 0.88 08/16/2022   BUN 18 08/16/2022   NA 137 08/16/2022   K 4.4 08/16/2022   CL 101 08/16/2022   CO2 28 08/16/2022   Lab Results  Component Value Date   ALT 14 08/16/2022   AST 15 08/16/2022   ALKPHOS 69 08/16/2022   BILITOT 0.4 08/16/2022   Lab Results  Component Value Date   HGBA1C 5.7 (H) 11/07/2021   HGBA1C 5.9 06/18/2021   HGBA1C 5.9 08/30/2019   Lab Results  Component Value Date   INSULIN 19.9 11/07/2021   Lab Results  Component Value Date   TSH 0.537 11/07/2021   Lab Results  Component Value Date   CHOL 221 (H) 11/07/2021   HDL 44 11/07/2021   LDLCALC 154 (H) 11/07/2021   LDLDIRECT 156.0 06/18/2021   TRIG 125 11/07/2021   CHOLHDL 5.0 (H) 11/07/2021   Lab Results  Component Value Date   VD25OH 69.0 04/30/2022   VD25OH 24.2 (L) 11/07/2021   VD25OH 12.59 (L) 06/18/2021   Lab Results  Component Value Date   WBC 6.5 08/16/2022   HGB 13.6 08/16/2022   HCT 40.2  08/16/2022   MCV 88.8 08/16/2022   PLT 389.0 08/16/2022   No results found for: "IRON", "TIBC", "FERRITIN"  Attestation Statements:   Reviewed by clinician on day of visit: allergies, medications, problem list, medical history, surgical history, family history, social history, and previous encounter notes.  Time spent on visit including pre-visit chart review and post-visit care and charting was 30 minutes.   I, Burt Knack, am acting as transcriptionist for Quillian Quince, MD.  I have reviewed the above documentation for accuracy and completeness, and I agree with the above. -  Quillian Quince, MD

## 2022-12-28 ENCOUNTER — Other Ambulatory Visit: Payer: Self-pay

## 2022-12-28 ENCOUNTER — Emergency Department (HOSPITAL_BASED_OUTPATIENT_CLINIC_OR_DEPARTMENT_OTHER)
Admission: EM | Admit: 2022-12-28 | Discharge: 2022-12-28 | Disposition: A | Discharge status: Home / Self Care | Payer: 59 | Attending: Emergency Medicine | Admitting: Emergency Medicine

## 2022-12-28 ENCOUNTER — Encounter (HOSPITAL_BASED_OUTPATIENT_CLINIC_OR_DEPARTMENT_OTHER): Payer: Self-pay

## 2022-12-28 DIAGNOSIS — L0231 Cutaneous abscess of buttock: Secondary | ICD-10-CM

## 2022-12-28 MED ORDER — SULFAMETHOXAZOLE-TRIMETHOPRIM 800-160 MG PO TABS
1.0000 | ORAL_TABLET | Freq: Once | ORAL | Status: AC
  Administered 2022-12-28: 1 via ORAL
  Filled 2022-12-28: qty 1

## 2022-12-28 MED ORDER — SULFAMETHOXAZOLE-TRIMETHOPRIM 800-160 MG PO TABS: 1.0000 | ORAL_TABLET | Freq: Two times a day (BID) | ORAL | 0 refills | Status: AC

## 2022-12-28 MED ORDER — LIDOCAINE-EPINEPHRINE (PF) 2 %-1:200000 IJ SOLN
10.0000 mL | Freq: Once | INTRAMUSCULAR | Status: DC
  Filled 2022-12-28: qty 20

## 2022-12-28 NOTE — Discharge Instructions (Signed)
It was a pleasure taking care of you here in the emergency department today  You appeared to have a superficial abscess to your skin which is drained here.  As we discussed in the room please do warm soaks, 2-3 times daily.  We want this area to keep continue to drain.  Take the antibiotics as prescribed.  Tylenol, Motrin as needed for pain  Follow-up with PCP in 2 days for wound recheck  Return for new or worsening symptoms

## 2022-12-28 NOTE — ED Provider Notes (Signed)
[facility] Provider Note   CSN: [id number] Arrival date & time: 12/28/22  1255     History  Chief Complaint  Patient presents with   Insect Bite   Abscess    Anne Lewis is a 57 y.o. female no significant past medical history here for evaluation of possible insect bite.  Patient states she noted pain and swelling to her right gluteal region on Monday.  Progressively worsening.  Has started using 2 days ago however close stop this now become more painful.  Does not extend into rectum.  No fever.  Had something previously many years ago which required I&D.  She is unsure if something bit her.  Did not see anything.  No pain with bowel movements.  No diabetes.  No pain or redness to perineal region  HPI     Home Medications Prior to Admission medications   Medication Sig Start Date End Date Taking? Authorizing Provider  sulfamethoxazole-trimethoprim (BACTRIM DS) 800-160 MG tablet Take 1 tablet by mouth 2 (two) times daily for 7 days. 12/28/22 01/04/23 Yes Nica Friske A, PA-C  buPROPion (WELLBUTRIN SR) 200 MG 12 hr tablet Take 1 tablet (200 mg total) by mouth in the morning. 11/18/22   Whitmire, Thermon Leyland, FNP  Cholecalciferol (VITAMIN D-3 PO) Take 50,000 Units by mouth once a week.    [provider]  cyclobenzaprine (FLEXERIL) 5 MG tablet Take 1 tablet (5 mg total) by mouth 3 (three) times daily as needed for muscle spasms. 10/15/22   Wyline Beady A, NP  diphenhydrAMINE HCl (BENADRYL ALLERGY PO) Take by mouth as needed.    [provider]  escitalopram (LEXAPRO) 20 MG tablet Take 1 tablet (20 mg total) by mouth daily. 11/18/22   Whitmire, Thermon Leyland, FNP  hyoscyamine (LEVSIN SL) 0.125 MG SL tablet Place 1 tablet (0.125 mg total) under the tongue every 6 (six) hours as needed for cramping. 11/22/21   Rachael Fee, MD  naproxen (NAPROSYN) 500 MG tablet Take 1 tablet (500 mg total) by mouth 2 (two) times daily with a  meal. Patient taking differently: Take 500 mg by mouth daily. 10/25/22   Loyola Mast, MD  omeprazole (PRILOSEC) 40 MG capsule Take 1 capsule (40 mg total) by mouth 2 (two) times daily. 11/18/22   Whitmire, Thermon Leyland, FNP  ZOLMitriptan (ZOMIG) 2.5 MG tablet Take 1 tablet (2.5 mg total) by mouth once for 1 dose. May repeat in 2 hours if headache persists or recurs. 10/11/21 11/07/97  Mliss Sax, MD      Allergies    Aspirin, Augmentin [amoxicillin-pot clavulanate], and Oxycodone    Review of Systems   Review of Systems  Constitutional: Negative.   HENT: Negative.    Respiratory: Negative.    Cardiovascular: Negative.   Gastrointestinal: Negative.        Right gluteal pain, swelling  Genitourinary: Negative.   Musculoskeletal: Negative.   Skin: Negative.   Neurological: Negative.   All other systems reviewed and are negative.   Physical Exam Updated Vital Signs BP 106/71 (BP Location: Left Arm)   Pulse 95   Temp 98.4 F (36.9 C) (Oral)   Resp 16   Ht 5\' 8"  (1.727 m)   Wt 65.3 kg   LMP 03/14/2018   SpO2 100%   BMI 21.90 kg/m  Physical Exam Vitals and nursing note reviewed.  Constitutional:      General: She is not in acute distress.  Appearance: She is well-developed. She is not ill-appearing, toxic-appearing or diaphoretic.  HENT:     Head: Atraumatic.  Eyes:     Pupils: Pupils are equal, round, and reactive to light.  Cardiovascular:     Rate and Rhythm: Normal rate.  Pulmonary:     Effort: No respiratory distress.  Abdominal:     General: There is no distension.  Genitourinary:      Comments: External hemorrhoids noted.  No perineal erythema or warmth.  2 cm area of fluctuance without induration without erythema, warmth, eschar to right gluteal region, does not extend into the perirectal/rectal or anal region.  Exam with female chaperone in room Musculoskeletal:        General: Normal range of motion.     Cervical back: Normal range of motion.   Skin:    General: Skin is warm and dry.     Capillary Refill: Capillary refill takes less than 2 seconds.     Comments: 2 cm rounded area of fluctuance without induration to right gluteal region.  Neurological:     General: No focal deficit present.     Mental Status: She is alert.  Psychiatric:        Mood and Affect: Mood normal.     ED Results / Procedures / Treatments   Labs (all labs ordered are listed, but only abnormal results are displayed) Labs Reviewed - No data to display  EKG None  Radiology No results found.  Procedures .Marland KitchenIncision and Drainage  Date/Time: 12/28/2022 2:47 PM  Performed by: Ralph Leyden A, PA-C Authorized by: Linwood Dibbles, PA-C   Consent:    Consent obtained:  Verbal   Consent given by:  Patient   Risks, benefits, and alternatives were discussed: yes     Risks discussed:  Bleeding, incomplete drainage, pain, infection and damage to other organs   Alternatives discussed:  No treatment, delayed treatment, alternative treatment, observation and referral Universal protocol:    Procedure explained and questions answered to patient or proxy's satisfaction: yes     Relevant documents present and verified: yes     Test results available : yes     Imaging studies available: yes     Required blood products, implants, devices, and special equipment available: yes     Site/side marked: yes     Immediately prior to procedure, a time out was called: yes     Patient identity confirmed:  Verbally with patient Location:    Type:  Abscess   Size:  2cm   Location:  Anogenital   Anogenital location:  Gluteal cleft Pre-procedure details:    Skin preparation:  Povidone-iodine Sedation:    Sedation type:  None Anesthesia:    Anesthesia method:  Local infiltration   Local anesthetic:  Lidocaine 1% WITH epi Procedure type:    Complexity:  Complex Procedure details:    Ultrasound guidance: no     Needle aspiration: no     Incision types:  Stab  incision   Incision depth:  Subcutaneous   Wound management:  Probed and deloculated, irrigated with saline and extensive cleaning   Drainage:  Purulent   Drainage amount:  Moderate   Wound treatment:  Wound left open   Packing materials:  None Post-procedure details:    Procedure completion:  Tolerated     Medications Ordered in ED Medications  lidocaine-EPINEPHrine (XYLOCAINE W/EPI) 2 %-1:200000 (PF) injection 10 mL (has no administration in time range)  sulfamethoxazole-trimethoprim (BACTRIM DS) 800-160 MG per  tablet 1 tablet (has no administration in time range)    ED Course/ Medical Decision Making/ A&P   57 year old here for evaluation of possible insect bite to right gluteal region which began on Monday, 5 days PTA.  She is afebrile, nonseptic, not ill-appearing.  She has no systemic symptoms.  She has 2 cm area of fluctuance without induration to right gluteal region.  Does not extend into perirectal/anal region.  Low suspicion for deep space infection, Fournier's gangrene, necrotizing fasciitis.  Appears to be superficial abscess.  Do not feel she needs labs or imaging at this time.  Will attempt bedside I&D  Patient reassessed.  See procedure note.  Tolerated well.  Moderate purulent drainage expressed.  Discussed warm soaks, p.o. abx, close follow-up, return for new or worsening symptoms.  Patient agreeable.  The patient has been appropriately medically screened and/or stabilized in the ED. I have low suspicion for any other emergent medical condition which would require further screening, evaluation or treatment in the ED or require inpatient management.  Patient is hemodynamically stable and in no acute distress.  Patient able to ambulate in department prior to ED.  Evaluation does not show acute pathology that would require ongoing or additional emergent interventions while in the emergency department or further inpatient treatment.  I have discussed the diagnosis with the  patient and answered all questions.  Pain is been managed while in the emergency department and patient has no further complaints prior to discharge.  Patient is comfortable with plan discussed in room and is stable for discharge at this time.  I have discussed strict return precautions for returning to the emergency department.  Patient was encouraged to follow-up with PCP/specialist refer to at discharge.                             Medical Decision Making Amount and/or Complexity of Data Reviewed Independent Historian: friend External Data Reviewed: labs, radiology and notes.  Risk OTC drugs. Prescription drug management. Decision regarding hospitalization. Diagnosis or treatment significantly limited by social determinants of health.          Final Clinical Impression(s) / ED Diagnoses Final diagnoses:  Gluteal abscess    Rx / DC Orders ED Discharge Orders          Ordered    sulfamethoxazole-trimethoprim (BACTRIM DS) 800-160 MG tablet  2 times daily        12/28/22 1450              Rosalio Catterton A, PA-C 12/28/22 1452    Terrilee Files, MD 12/28/22 1656

## 2022-12-28 NOTE — ED Triage Notes (Signed)
States on Wednesday something bit her on her lower buttocks. States has been swelling and worsening pain since.

## 2022-12-28 NOTE — ED Notes (Signed)
ED Provider at bedside. 

## 2022-12-30 ENCOUNTER — Telehealth (INDEPENDENT_AMBULATORY_CARE_PROVIDER_SITE_OTHER): Payer: 59 | Admitting: Family Medicine

## 2023-01-14 ENCOUNTER — Encounter (INDEPENDENT_AMBULATORY_CARE_PROVIDER_SITE_OTHER): Payer: Self-pay | Admitting: Family Medicine

## 2023-01-14 ENCOUNTER — Ambulatory Visit (INDEPENDENT_AMBULATORY_CARE_PROVIDER_SITE_OTHER): Payer: 59 | Admitting: Family Medicine

## 2023-01-14 VITALS — BP 109/67 | HR 63 | Temp 98.1°F | Ht 68.0 in | Wt 141.0 lb

## 2023-01-14 DIAGNOSIS — F32A Depression, unspecified: Secondary | ICD-10-CM

## 2023-01-14 DIAGNOSIS — Z6821 Body mass index (BMI) 21.0-21.9, adult: Secondary | ICD-10-CM

## 2023-01-14 DIAGNOSIS — F3289 Other specified depressive episodes: Secondary | ICD-10-CM

## 2023-01-14 DIAGNOSIS — R634 Abnormal weight loss: Secondary | ICD-10-CM | POA: Diagnosis not present

## 2023-01-14 DIAGNOSIS — K2100 Gastro-esophageal reflux disease with esophagitis, without bleeding: Secondary | ICD-10-CM | POA: Diagnosis not present

## 2023-01-14 DIAGNOSIS — E669 Obesity, unspecified: Secondary | ICD-10-CM

## 2023-01-14 MED ORDER — OMEPRAZOLE 40 MG PO CPDR
40.0000 mg | DELAYED_RELEASE_CAPSULE | Freq: Two times a day (BID) | ORAL | 0 refills | Status: DC
Start: 2023-01-14 — End: 2023-02-12

## 2023-01-14 MED ORDER — BUPROPION HCL ER (SR) 200 MG PO TB12: 200.0000 mg | ORAL_TABLET | Freq: Every morning | ORAL | 0 refills | Status: DC

## 2023-01-14 MED ORDER — ESCITALOPRAM OXALATE 20 MG PO TABS: 20.0000 mg | ORAL_TABLET | Freq: Every day | ORAL | 0 refills | Status: DC

## 2023-01-15 NOTE — Progress Notes (Signed)
.smr  Office: [phone]  /  Fax: [phone]  WEIGHT SUMMARY AND BIOMETRICS  Anthropometric Measurements Height: 5\' 8"  (1.727 m) Weight: 141 lb (64 kg) BMI (Calculated): 21.44 Weight at Last Visit: 146 lb Weight Lost Since Last Visit: 5 lb Weight Gained Since Last Visit: 0 Starting Weight: 207 lb Total Weight Loss (lbs): 66 lb (29.9 kg)   Body Composition  Body Fat %: 31 % Fat Mass (lbs): 43.8 lbs Muscle Mass (lbs): 92.8 lbs Total Body Water (lbs): 69.6 lbs Visceral Fat Rating : 6   Other Clinical Data Fasting: No Labs: No Today's Visit #: 24 Starting Date: 11/07/21    Chief Complaint: OBESITY  History of Present Illness   The patient is a 57 year old individual with a history of obesity, gastroesophageal reflux disease (GERD), and depression, who presents today with concerns about her weight, GERD symptoms, and emotional eating behaviors. She reports adherence to a category 2 diet plan approximately 30% of the time and engages in strengthening exercises and walking for two hours, six times per week. Over the past month, she has lost five pounds.  Recently, the patient experienced a challenging trip to [place] during which she struggled with keeping food down. She attributes this to a combination of the type of food, stress, and heat. During her travel, she consumed a significant amount of coffee due to a prolonged stay at the airport. She also reports constipation, having not had a bowel movement in 10 days, and has started taking MiraLAX.  The patient describes persistent abdominal pain, similar to previous episodes of diverticulitis. She also reports difficulty eating due to stomach soreness and a lack of appetite, particularly in warm weather. Despite these challenges, she has managed to lose weight, although not in a desirable manner as she is not eating enough nutrition  The patient reports worsening heartburn, describing a burning sensation and soreness in  the stomach, particularly on the side previously affected by diverticulitis. She has to use two pillows when sleeping to prevent acid reflux. She has been taking omeprazole religiously, despite the ongoing symptoms.  The patient also reports difficulty swallowing, which she attributes to either acid reflux or an unknown cause. She has to chew food very well to prevent it from getting stuck in her esophagus. This has led to a preference for softer foods like pasta and potatoes, and she has been consuming more chicken and salmon for protein. She notes drinking even water is difficult.  The patient's ongoing struggles with GERD and swallowing difficulties are significantly affecting her quality of life and causing frustration. She expresses concern about the impact on her social life, as she has to be selective about what she eats to avoid triggering her symptoms. Despite these challenges, she has been adhering to her prescribed medications, including bupropion and Lexapro, for her depression.          PHYSICAL EXAM:  Blood pressure 109/67, pulse 63, temperature 98.1 F (36.7 C), height 5\' 8"  (1.727 m), weight 141 lb (64 kg), last menstrual period 03/14/2018, SpO2 90 %. Body mass index is 21.44 kg/m.  DIAGNOSTIC DATA REVIEWED:  BMET    Component Value Date/Time   NA 137 08/16/2022 0923   NA 141 11/07/2021 1029   K 4.4 08/16/2022 0923   CL 101 08/16/2022 0923   CO2 28 08/16/2022 0923   GLUCOSE 78 08/16/2022 0923   BUN 18 08/16/2022 0923   BUN 22 11/07/2021 1029   CREATININE 0.88 08/16/2022 0923   CREATININE 0.91  08/12/2016 0837   CALCIUM 10.0 08/16/2022 0923   GFRNONAA >60 01/24/2022 1739   Lab Results  Component Value Date   HGBA1C 5.7 (H) 11/07/2021   HGBA1C 5.9 08/30/2019   Lab Results  Component Value Date   INSULIN 19.9 11/07/2021   Lab Results  Component Value Date   TSH 0.537 11/07/2021   CBC    Component Value Date/Time   WBC 6.5 08/16/2022 0923   RBC 4.53  08/16/2022 0923   HGB 13.6 08/16/2022 0923   HGB 14.7 11/07/2021 1029   HCT 40.2 08/16/2022 0923   HCT 43.8 11/07/2021 1029   PLT 389.0 08/16/2022 0923   PLT 338 11/07/2021 1029   MCV 88.8 08/16/2022 0923   MCV 89 11/07/2021 1029   MCH 29.5 01/24/2022 1739   MCHC 33.9 08/16/2022 0923   RDW 13.5 08/16/2022 0923   RDW 12.6 11/07/2021 1029   Iron Studies No results found for: "IRON", "TIBC", "FERRITIN", "IRONPCTSAT" Lipid Panel     Component Value Date/Time   CHOL 221 (H) 11/07/2021 1029   TRIG 125 11/07/2021 1029   HDL 44 11/07/2021 1029   CHOLHDL 5.0 (H) 11/07/2021 1029   CHOLHDL 6 06/18/2021 1135   VLDL 66.2 (H) 06/18/2021 1135   LDLCALC 154 (H) 11/07/2021 1029   LDLDIRECT 156.0 06/18/2021 1135   Hepatic Function Panel     Component Value Date/Time   PROT 7.4 08/16/2022 0923   PROT 7.6 11/07/2021 1029   ALBUMIN 4.5 08/16/2022 0923   ALBUMIN 5.0 (H) 11/07/2021 1029   AST 15 08/16/2022 0923   ALT 14 08/16/2022 0923   ALKPHOS 69 08/16/2022 0923   BILITOT 0.4 08/16/2022 0923   BILITOT <0.2 11/07/2021 1029   BILIDIR 0.1 04/16/2022 1629      Component Value Date/Time   TSH 0.537 11/07/2021 1029   Nutritional Lab Results  Component Value Date   VD25OH 69.0 04/30/2022   VD25OH 24.2 (L) 11/07/2021   VD25OH 12.59 (L) 06/18/2021     Assessment and Plan    Gastroesophageal Reflux Disease (GERD): Increased symptoms of heartburn and dysphagia, despite adherence to twice daily Omeprazole. Recent unintentional weight loss and decreased food intake due to symptoms. -Continue Omeprazole twice daily. -Consider adding Zantac as needed for additional symptom control. -Refer back to Gastroenterology for further evaluation and possible intervention  Obesity: Patient has lost weight, but this is likely due to decreased food intake from GERD symptoms rather than intentional weight loss. -Her Goal is to Maintain current weight. -Increase caloric intake with healthy fats, such as  nuts and avocados, as tolerated. - There is a concern of potential for eating disorder being part of her problem. I will continue to monitor and assess this.  Depression: No reported issues with current treatment (Bupropion and Lexapro). -Continue Bupropion and Lexapro as prescribed. May need to stop bupropion in the near future.  Follow-up in 3-4 weeks to reassess symptoms and weight.       She was informed of the importance of frequent follow up visits to maximize her success with intensive lifestyle modifications for her multiple health conditions.    Quillian Quince, MD

## 2023-01-30 ENCOUNTER — Other Ambulatory Visit (INDEPENDENT_AMBULATORY_CARE_PROVIDER_SITE_OTHER): Payer: Self-pay | Admitting: Family Medicine

## 2023-01-30 DIAGNOSIS — F3289 Other specified depressive episodes: Secondary | ICD-10-CM

## 2023-02-05 ENCOUNTER — Encounter: Payer: Self-pay | Admitting: Family Medicine

## 2023-02-05 ENCOUNTER — Ambulatory Visit (INDEPENDENT_AMBULATORY_CARE_PROVIDER_SITE_OTHER): Payer: 59 | Admitting: Family Medicine

## 2023-02-05 VITALS — BP 114/70 | HR 84 | Temp 97.7°F | Ht 68.0 in | Wt 145.2 lb

## 2023-02-05 DIAGNOSIS — J029 Acute pharyngitis, unspecified: Secondary | ICD-10-CM

## 2023-02-05 DIAGNOSIS — Z1159 Encounter for screening for other viral diseases: Secondary | ICD-10-CM

## 2023-02-05 DIAGNOSIS — R7303 Prediabetes: Secondary | ICD-10-CM

## 2023-02-05 DIAGNOSIS — Z114 Encounter for screening for human immunodeficiency virus [HIV]: Secondary | ICD-10-CM | POA: Diagnosis not present

## 2023-02-05 DIAGNOSIS — R1313 Dysphagia, pharyngeal phase: Secondary | ICD-10-CM

## 2023-02-05 LAB — CBC
HCT: 40.4 % (ref 36.0–46.0)
Hemoglobin: 13.3 g/dL (ref 12.0–15.0)
MCHC: 33 g/dL (ref 30.0–36.0)
MCV: 91 fl (ref 78.0–100.0)
Platelets: 360 10*3/uL (ref 150.0–400.0)
RBC: 4.44 Mil/uL (ref 3.87–5.11)
RDW: 13.7 % (ref 11.5–15.5)
WBC: 7.2 10*3/uL (ref 4.0–10.5)

## 2023-02-05 LAB — BASIC METABOLIC PANEL
BUN: 20 mg/dL (ref 6–23)
CO2: 30 mEq/L (ref 19–32)
Calcium: 10.2 mg/dL (ref 8.4–10.5)
Chloride: 101 mEq/L (ref 96–112)
Creatinine, Ser: 0.92 mg/dL (ref 0.40–1.20)
GFR: 69.26 mL/min (ref >60.00)
Glucose, Bld: 77 mg/dL (ref 70–99)
Potassium: 4.6 mEq/L (ref 3.5–5.1)
Sodium: 140 mEq/L (ref 135–145)

## 2023-02-05 LAB — POCT RAPID STREP A (OFFICE): Rapid Strep A Screen: NEGATIVE

## 2023-02-05 LAB — HEMOGLOBIN A1C: Hgb A1c MFr Bld: 5.6 % (ref 4.6–6.5)

## 2023-02-05 NOTE — Progress Notes (Signed)
Established Patient Office Visit   Subjective:  Patient ID: Anne Lewis, female    DOB: 28-Nov-1965  Age: 57 y.o. MRN: [id number]  Chief Complaint  Patient presents with   Sore Throat    Swollen glands on both sides of neck. Pt states she has not been able to eat whole pieces of food, fever. Pt also complains of NV. GI Sept 6.     Sore Throat  Associated symptoms include vomiting. Pertinent negatives include no coughing or shortness of breath.   Encounter Diagnoses  Name Primary?   Pharyngeal dysphagia Yes   Pharyngitis, unspecified etiology    Screening for HIV (human immunodeficiency virus)    Encounter for hepatitis C screening test for low risk patient    Pre-diabetes    She is having ongoing issues with dysphagia mostly solid foods reflux and nausea vomiting.  Follow-up is planned with GI.  She had developed fever 3 days ago but has since recovered.  She is back.   Review of Systems  Constitutional: Negative.   HENT:  Positive for sore throat.   Respiratory:  Negative for cough, sputum production, shortness of breath and wheezing.   Gastrointestinal:  Positive for heartburn, nausea and vomiting.  Genitourinary: Negative.   Musculoskeletal:  Negative for joint pain and myalgias.     Current Outpatient Medications:    buPROPion (WELLBUTRIN SR) 200 MG 12 hr tablet, Take 1 tablet (200 mg total) by mouth in the morning., Disp: 90 tablet, Rfl: 0   Cholecalciferol (VITAMIN D-3 PO), Take 50,000 Units by mouth once a week., Disp: , Rfl:    diphenhydrAMINE HCl (BENADRYL ALLERGY PO), Take by mouth as needed., Disp: , Rfl:    escitalopram (LEXAPRO) 20 MG tablet, Take 1 tablet (20 mg total) by mouth daily., Disp: 90 tablet, Rfl: 0   hyoscyamine (LEVSIN SL) 0.125 MG SL tablet, Place 1 tablet (0.125 mg total) under the tongue every 6 (six) hours as needed for cramping., Disp: 30 tablet, Rfl: 6   naproxen (NAPROSYN) 500 MG tablet, Take 1 tablet (500 mg total) by mouth 2 (two)  times daily with a meal. (Patient taking differently: Take 500 mg by mouth daily.), Disp: 14 tablet, Rfl: 0   omeprazole (PRILOSEC) 40 MG capsule, Take 1 capsule (40 mg total) by mouth 2 (two) times daily., Disp: 180 capsule, Rfl: 0   ZOLMitriptan (ZOMIG) 2.5 MG tablet, Take 1 tablet (2.5 mg total) by mouth once for 1 dose. May repeat in 2 hours if headache persists or recurs., Disp: 10 tablet, Rfl: 1   cyclobenzaprine (FLEXERIL) 5 MG tablet, Take 1 tablet (5 mg total) by mouth 3 (three) times daily as needed for muscle spasms. (Patient not taking: Reported on 02/05/2023), Disp: 30 tablet, Rfl: 0   Objective:     BP 114/70   Pulse 84   Temp 97.7 F (36.5 C)   Ht 5\' 8"  (1.727 m)   Wt 145 lb 3.2 oz (65.9 kg)   LMP 03/14/2018   SpO2 99%   BMI 22.08 kg/m  Wt Readings from Last 3 Encounters:  02/05/23 145 lb 3.2 oz (65.9 kg)  01/14/23 141 lb (64 kg)  12/28/22 144 lb (65.3 kg)      Physical Exam Constitutional:      General: She is not in acute distress.    Appearance: Normal appearance. She is not ill-appearing, toxic-appearing or diaphoretic.  HENT:     Head: Normocephalic and atraumatic.     Right Ear: External  ear normal.     Left Ear: External ear normal.     Mouth/Throat:     Mouth: Mucous membranes are moist. No oral lesions.     Pharynx: Oropharynx is clear. No pharyngeal swelling, oropharyngeal exudate or posterior oropharyngeal erythema.  Eyes:     General: No scleral icterus.       Right eye: No discharge.        Left eye: No discharge.     Extraocular Movements: Extraocular movements intact.     Conjunctiva/sclera: Conjunctivae normal.     Pupils: Pupils are equal, round, and reactive to light.  Neck:     Thyroid: No thyromegaly.  Cardiovascular:     Rate and Rhythm: Normal rate and regular rhythm.  Pulmonary:     Effort: Pulmonary effort is normal. No respiratory distress.     Breath sounds: Normal breath sounds. No stridor.  Abdominal:     General: Bowel  sounds are normal.     Tenderness: There is no abdominal tenderness. There is no guarding.  Musculoskeletal:     Cervical back: Normal range of motion and neck supple. No rigidity or tenderness.  Lymphadenopathy:     Cervical: No cervical adenopathy.  Skin:    General: Skin is warm and dry.  Neurological:     Mental Status: She is alert and oriented to person, place, and time.  Psychiatric:        Mood and Affect: Mood normal.        Behavior: Behavior normal.      No results found for any visits on 02/05/23.    The 10-year ASCVD risk score (Arnett DK, et al., 2019) is: 5.9%    Assessment & Plan:   Pharyngeal dysphagia  Pharyngitis, unspecified etiology -     POCT rapid strep A -     CBC  Screening for HIV (human immunodeficiency virus) -     HIV Antibody (routine testing w rflx)  Encounter for hepatitis C screening test for low risk patient -     Hepatitis C antibody  Pre-diabetes -     Basic metabolic panel -     Hemoglobin A1c    Return in about 6 months (around 08/08/2023), or if symptoms worsen or fail to improve.  Exam was normal.  Will check for strep throat.  Screening for HIV and hep C.  Sore throat is most likely secondary to ongoing reflux.  Rechecking A1c.  Hopefully will be slower with her weight loss.  Mliss Sax, MD

## 2023-02-06 LAB — HEPATITIS C ANTIBODY: Hepatitis C Ab: NONREACTIVE

## 2023-02-06 LAB — HIV ANTIBODY (ROUTINE TESTING W REFLEX): HIV 1&2 Ab, 4th Generation: NONREACTIVE

## 2023-02-10 ENCOUNTER — Ambulatory Visit: Payer: 59 | Admitting: Family Medicine

## 2023-02-12 ENCOUNTER — Other Ambulatory Visit (INDEPENDENT_AMBULATORY_CARE_PROVIDER_SITE_OTHER): Payer: Self-pay | Admitting: Family Medicine

## 2023-02-12 ENCOUNTER — Encounter (INDEPENDENT_AMBULATORY_CARE_PROVIDER_SITE_OTHER): Payer: Self-pay | Admitting: Family Medicine

## 2023-02-12 ENCOUNTER — Telehealth: Payer: 59 | Admitting: Nurse Practitioner

## 2023-02-12 ENCOUNTER — Ambulatory Visit (INDEPENDENT_AMBULATORY_CARE_PROVIDER_SITE_OTHER): Payer: 59 | Admitting: Family Medicine

## 2023-02-12 VITALS — BP 99/63 | HR 70 | Temp 98.2°F | Ht 68.0 in | Wt 145.0 lb

## 2023-02-12 DIAGNOSIS — R198 Other specified symptoms and signs involving the digestive system and abdomen: Secondary | ICD-10-CM

## 2023-02-12 DIAGNOSIS — Z6822 Body mass index (BMI) 22.0-22.9, adult: Secondary | ICD-10-CM

## 2023-02-12 DIAGNOSIS — J0140 Acute pansinusitis, unspecified: Secondary | ICD-10-CM

## 2023-02-12 DIAGNOSIS — E669 Obesity, unspecified: Secondary | ICD-10-CM | POA: Diagnosis not present

## 2023-02-12 DIAGNOSIS — R7303 Prediabetes: Secondary | ICD-10-CM | POA: Diagnosis not present

## 2023-02-12 MED ORDER — DOXYCYCLINE HYCLATE 100 MG PO TABS
100.0000 mg | ORAL_TABLET | Freq: Two times a day (BID) | ORAL | 0 refills | Status: AC
Start: 2023-02-12 — End: 2023-02-22

## 2023-02-12 MED ORDER — OMEPRAZOLE 40 MG PO CPDR: 40.0000 mg | DELAYED_RELEASE_CAPSULE | Freq: Two times a day (BID) | ORAL | 0 refills | Status: DC

## 2023-02-12 NOTE — Progress Notes (Signed)
E-Visit for Sinus Problems  We are sorry that you are not feeling well.  Here is how we plan to help!  Based on what you have shared with me it looks like you have sinusitis.  Sinusitis is inflammation and infection in the sinus cavities of the head.  Based on your presentation I believe you most likely have Acute Bacterial Sinusitis.  This is an infection caused by bacteria and is treated with antibiotics. I have prescribed Doxycycline 100mg by mouth twice a day for 10 days. You may use an oral decongestant such as Mucinex D or if you have glaucoma or high blood pressure use plain Mucinex. Saline nasal spray help and can safely be used as often as needed for congestion.  If you develop worsening sinus pain, fever or notice severe headache and vision changes, or if symptoms are not better after completion of antibiotic, please schedule an appointment with a health care provider.    Sinus infections are not as easily transmitted as other respiratory infection, however we still recommend that you avoid close contact with loved ones, especially the very young and elderly.  Remember to wash your hands thoroughly throughout the day as this is the number one way to prevent the spread of infection!  Home Care: Only take medications as instructed by your medical team. Complete the entire course of an antibiotic. Do not take these medications with alcohol. A steam or ultrasonic humidifier can help congestion.  You can place a towel over your head and breathe in the steam from hot water coming from a faucet. Avoid close contacts especially the very young and the elderly. Cover your mouth when you cough or sneeze. Always remember to wash your hands.  Get Help Right Away If: You develop worsening fever or sinus pain. You develop a severe head ache or visual changes. Your symptoms persist after you have completed your treatment plan.  Make sure you Understand these instructions. Will watch your  condition. Will get help right away if you are not doing well or get worse.  Thank you for choosing an e-visit.  Your e-visit answers were reviewed by a board certified advanced clinical practitioner to complete your personal care plan. Depending upon the condition, your plan could have included both over the counter or prescription medications.  Please review your pharmacy choice. Make sure the pharmacy is open so you can pick up prescription now. If there is a problem, you may contact your provider through MyChart messaging and have the prescription routed to another pharmacy.  Your safety is important to us. If you have drug allergies check your prescription carefully.   For the next 24 hours you can use MyChart to ask questions about today's visit, request a non-urgent call back, or ask for a work or school excuse. You will get an email in the next two days asking about your experience. I hope that your e-visit has been valuable and will speed your recovery.   Meds ordered this encounter  Medications   doxycycline (VIBRA-TABS) 100 MG tablet    Sig: Take 1 tablet (100 mg total) by mouth 2 (two) times daily for 10 days.    Dispense:  20 tablet    Refill:  0    I spent approximately 5 minutes reviewing the patient's history, current symptoms and coordinating their care today.   

## 2023-02-13 MED ORDER — OMEPRAZOLE 40 MG PO CPDR
40.0000 mg | DELAYED_RELEASE_CAPSULE | Freq: Every day | ORAL | 3 refills | Status: DC
Start: 2023-02-13 — End: 2023-03-28

## 2023-02-13 NOTE — Telephone Encounter (Signed)
ok to change to 1x per day. please notify the patient of her insurance refusal to cover BID

## 2023-02-13 NOTE — Telephone Encounter (Signed)
INSURANCE WILL ONLY COVER 1 CAP PER DAY. PLEASE SEND NEW RX FOR ALTERNATE INSTRUCTIONS OR ALTERNATE MED.  Please advise

## 2023-02-17 NOTE — Progress Notes (Signed)
Chief Complaint:   OBESITY Anne Lewis is here to discuss her progress with her obesity treatment plan along with follow-up of her obesity related diagnoses. [name] is on the Category 2 Plan and states she is following her eating plan approximately 100% of the time. [name] states she is lifting weights and walking for 40-60 minutes 5-6 times per week.  Today's visit was #: 25 Starting weight: 207 lbs Starting date: 11/07/2021 Today's weight: 145 lbs Today's date: 02/12/2023 Total lbs lost to date: 62 Total lbs lost since last in-office visit: 0  Interim History: Patient has been working on increasing her protein.  She is struggling with sinus pain which makes chewing painful.  She has been eating more carbohydrates as that is what she can tolerate.  She is not sleeping well due to pain and pressure, and she is not able to lay down.  Subjective:   1. GI distress Patient is having occasional episodes of vomiting with her chronic abdominal pain.  She has an appointment with GI in 6 weeks.  She feels Prilosec gives her some relief.  2. Pre-diabetes Patient's recent A1c done by her PCP was 5.6.  It has been improving with decreased simple carbohydrates, down from 5.93 years ago.  She has no signs of hypoglycemia.  Assessment/Plan:   1. GI distress We will refill Protonix 40 mg twice daily #60 for 1 month.  Patient was encouraged to keep her GI appointment even if she starts to feel better, and patient agrees.  2. Pre-diabetes Patient is to continue to work on decreasing simple carbohydrates, and increased her vegetables and lean protein.  Her goal is to not lose weight but to maximize healthy nutrition.  We will continue to follow.  3. BMI 22.0-22.9, adult  4. Obesity, Beginning BMI 31.47 Anne Lewis is currently in the action stage of change. As such, her goal is to maintain weight for now. She has agreed to the Category 2 Plan.   Patient was encouraged to add protein drinks to  her diet to improve nutrition.  She was shown how to use the e-visit in MyChart for sinus pain.  Her goal is to maintain her weight and improve her nutrition as she feels better.  Behavioral modification strategies: increasing lean protein intake and no skipping meals.  Anne Lewis has agreed to follow-up with our clinic in 4 weeks. She was informed of the importance of frequent follow-up visits to maximize her success with intensive lifestyle modifications for her multiple health conditions.   Objective:   Blood pressure 99/63, pulse 70, temperature 98.2 F (36.8 C), height 5\' 8"  (1.727 m), weight 145 lb (65.8 kg), last menstrual period 03/14/2018, SpO2 100%. Body mass index is 22.05 kg/m.  Lab Results  Component Value Date   CREATININE 0.92 02/05/2023   BUN 20 02/05/2023   NA 140 02/05/2023   K 4.6 02/05/2023   CL 101 02/05/2023   CO2 30 02/05/2023   Lab Results  Component Value Date   ALT 14 08/16/2022   AST 15 08/16/2022   ALKPHOS 69 08/16/2022   BILITOT 0.4 08/16/2022   Lab Results  Component Value Date   HGBA1C 5.6 02/05/2023   HGBA1C 5.7 (H) 11/07/2021   HGBA1C 5.9 06/18/2021   HGBA1C 5.9 08/30/2019   Lab Results  Component Value Date   INSULIN 19.9 11/07/2021   Lab Results  Component Value Date   TSH 0.537 11/07/2021   Lab Results  Component Value Date   CHOL 221 (H)  11/07/2021   HDL 44 11/07/2021   LDLCALC 154 (H) 11/07/2021   LDLDIRECT 156.0 06/18/2021   TRIG 125 11/07/2021   CHOLHDL 5.0 (H) 11/07/2021   Lab Results  Component Value Date   VD25OH 69.0 04/30/2022   VD25OH 24.2 (L) 11/07/2021   VD25OH 12.59 (L) 06/18/2021   Lab Results  Component Value Date   WBC 7.2 02/05/2023   HGB 13.3 02/05/2023   HCT 40.4 02/05/2023   MCV 91.0 02/05/2023   PLT 360.0 02/05/2023   No results found for: "IRON", "TIBC", "FERRITIN"  Attestation Statements:   Reviewed by clinician on day of visit: allergies, medications, problem list, medical history,  surgical history, family history, social history, and previous encounter notes.   I, Burt Knack, am acting as transcriptionist for Quillian Quince, MD.  I have reviewed the above documentation for accuracy and completeness, and I agree with the above. -  Quillian Quince, MD

## 2023-03-12 ENCOUNTER — Ambulatory Visit (INDEPENDENT_AMBULATORY_CARE_PROVIDER_SITE_OTHER): Payer: 59 | Admitting: Family Medicine

## 2023-03-28 ENCOUNTER — Ambulatory Visit (INDEPENDENT_AMBULATORY_CARE_PROVIDER_SITE_OTHER): Payer: 59 | Admitting: Physician Assistant

## 2023-03-28 ENCOUNTER — Encounter: Payer: Self-pay | Admitting: Physician Assistant

## 2023-03-28 VITALS — BP 96/68 | HR 92 | Ht 67.5 in | Wt 138.4 lb

## 2023-03-28 DIAGNOSIS — K219 Gastro-esophageal reflux disease without esophagitis: Secondary | ICD-10-CM | POA: Diagnosis not present

## 2023-03-28 DIAGNOSIS — R1310 Dysphagia, unspecified: Secondary | ICD-10-CM

## 2023-03-28 DIAGNOSIS — R112 Nausea with vomiting, unspecified: Secondary | ICD-10-CM

## 2023-03-28 DIAGNOSIS — R1031 Right lower quadrant pain: Secondary | ICD-10-CM | POA: Diagnosis not present

## 2023-03-28 DIAGNOSIS — G8929 Other chronic pain: Secondary | ICD-10-CM

## 2023-03-28 MED ORDER — DEXLANSOPRAZOLE 60 MG PO CPDR: 60.0000 mg | DELAYED_RELEASE_CAPSULE | Freq: Every day | ORAL | 11 refills | Status: DC

## 2023-03-28 MED ORDER — ONDANSETRON HCL 4 MG PO TABS: 4.0000 mg | ORAL_TABLET | Freq: Four times a day (QID) | ORAL | 3 refills | Status: DC | PRN

## 2023-03-28 NOTE — Progress Notes (Signed)
Agree with assessment and plan as outlined. In the interim could also consider a trial of Cymbalta for a few months to see if that helps her symptoms, in the absence of any med interactions. Thanks

## 2023-03-28 NOTE — Patient Instructions (Signed)
_______________________________________________________  If your blood pressure at your visit was 140/90 or greater, please contact your primary care physician to follow up on this.  _______________________________________________________  If you are age 57 or older, your body mass index should be between 23-30. Your Body mass index is 21.35 kg/m. If this is out of the aforementioned range listed, please consider follow up with your Primary Care Provider.  If you are age 46 or younger, your body mass index should be between 19-25. Your Body mass index is 21.35 kg/m. If this is out of the aformentioned range listed, please consider follow up with your Primary Care Provider.   ________________________________________________________  The [facility] providers would like to encourage you to use [facility] to communicate with providers for non-urgent requests or questions.  Due to long hold times on the telephone, sending your provider a message by [facility] may be a faster and more efficient way to get a response.  Please allow 48 business hours for a response.  Please remember that this is for non-urgent requests.  _______________________________________________________  Stop omeprazole.   We have sent the following medications to your pharmacy for you to pick up at your convenience: Dexilant Zofran  You have been scheduled for an appointment with Dr. Adela Lank on 07-29-2023 at 9:20am . Please arrive 10 minutes early for your appointment.  You have been scheduled for a Barium Esophogram at [facility] (1st floor of the hospital) on 04-14-2023 at 9am. Please arrive 30 minutes prior to your appointment for registration. Make certain not to have anything to eat or drink 3 hours prior to your test. If you need to reschedule for any reason, please contact radiology at [phone] to do so. __________________________________________________________________ A barium swallow is an examination  that concentrates on views of the esophagus. This tends to be a double contrast exam (barium and two liquids which, when combined, create a gas to distend the wall of the oesophagus) or single contrast (non-ionic iodine based). The study is usually tailored to your symptoms so a good history is essential. Attention is paid during the study to the form, structure and configuration of the esophagus, looking for functional disorders (such as aspiration, dysphagia, achalasia, motility and reflux) EXAMINATION You may be asked to change into a gown, depending on the type of swallow being performed. A radiologist and radiographer will perform the procedure. The radiologist will advise you of the type of contrast selected for your procedure and direct you during the exam. You will be asked to stand, sit or lie in several different positions and to hold a small amount of fluid in your mouth before being asked to swallow while the imaging is performed .In some instances you may be asked to swallow barium coated marshmallows to assess the motility of a solid food bolus. The exam can be recorded as a digital or video fluoroscopy procedure. POST PROCEDURE It will take 1-2 days for the barium to pass through your system. To facilitate this, it is important, unless otherwise directed, to increase your fluids for the next 24-48hrs and to resume your normal diet.  This test typically takes about 30 minutes to perform. __________________________________________________________________________________  It was a pleasure to see you today!  Thank you for trusting me with your gastrointestinal care!

## 2023-03-28 NOTE — Progress Notes (Signed)
Chief Complaint: GERD and abdominal pain  HPI:    [name] is a 57 year old female with a past medical history as listed below including GERD, known to Dr. Adela Lank, who presents to clinic today for complaint of reflux and weight loss.    06/04/2022 EGD with Dr. Orvan Falconer with mild distal esophagitis, mild gastritis and otherwise normal.  Pathology confirmed reflux esophagitis and gastritis with no H. pylori.  She was continued on Omeprazole 40 twice daily.    07/01/2022 patient seen in clinic by me and at that time discussed history of laparoscopic appendectomy in 09/03/2021.  At that visit patient continued nausea and vomiting worse with certain foods.  Was using MiraLAX for better bowel movements.  At that time ordered repeat CTAP given change in urination and history of kidney stones as well as nausea.  Also gastric emptying study.    09/19/2021 HIDA scan showed patent cystic and common bile ducts and normal gallbladder ejection fraction.    08/12/2022 CTAP with contrast with no acute abnormality, scattered left-sided colonic diverticulosis without findings of acute diverticulitis and aortic atherosclerosis.    12/03/2022 colonoscopy for a family history of colon cancer with one 4 mm polyp in the ascending colon, diverticulosis in the sigmoid colon, internal hemorrhoids and otherwise normal.  Pathology showed adenomatous polyp.  Repeat recommended in 5 years given patient's family history.    02/05/2023 CBC, BMP, hemoglobin A1c all normal.    02/12/2023 patient saw healthy weight loss clinic and was continued with occasional episodes of vomiting with chronic abdominal pain.    Today, the patient presents to clinic and tells me that she is just "miserable".  First describes she has chronic right lower quadrant pain which has not changed recently and sometimes is so bad she has has to lay down in the bed.  She has had multiple imaging studies for this in the past and tells me it is worse when she is  walking a lot and even sometimes when she is laying down, it is the same pain that it has always been just seems more frequent and severe now.  Continues to use MiraLAX often 3-4 doses a day in order to achieve a bowel movement daily.  Regardless of this continues with pain.    Also describes ongoing reflux symptoms and regurgitation.  Along with this vomiting which she tells me often happens when she bends over and sometimes it will be food she ate hours before.  She has changed to lactose-free milk and he is using her Omeprazole 40 mg in the morning and she does feel like it helped slightly with the burning and epigastric discomfort but remains bloated after eating and still having these episodes of nausea and vomiting as well as now trouble swallowing, feeling that food and pills get stuck on their way down.  At night she has regurgitation of acid which often wakes her up and so she sleeps at an angle.  Along with this is noticed some cramping in her legs and arms over the past few weeks.    She has intentionally lost over 60 pounds over the past year.  She is following with a healthy weight clinic.    Denies fever, chills or blood in her stool.   Past Medical History:  Diagnosis Date   Allergy    anemia    Anemia    Anxiety    Aortic atherosclerosis (HCC)    Arthritis    B12 deficiency    Back  pain    BRCA negative 11/2013   Chest pain    Constipation    Depression    Fatty liver    Gallbladder problem    GERD (gastroesophageal reflux disease)    H pylori ulcer    Heartburn    Joint pain    Migraines    Palpitation    Pre-diabetes    Seizures (HCC)    3 YEARS AGO UPDATED 11/07/22   sleep apnea    Stomach ulcer    Ulcer of gastric fundus    Vitamin D deficiency     Past Surgical History:  Procedure Laterality Date   CESAREAN SECTION  [id number]   COLONOSCOPY     ENDOMETRIAL CRYOABLATION  07/10/2011   HER OPTION - office performed   LAPAROSCOPIC APPENDECTOMY N/A 09/03/2021    Procedure: APPENDECTOMY LAPAROSCOPIC;  Surgeon: Axel Filler, MD;  Location: [facility];  Service: General;  Laterality: N/A;   TONSILLECTOMY  07/22/1985    Current Outpatient Medications  Medication Sig Dispense Refill   buPROPion (WELLBUTRIN SR) 200 MG 12 hr tablet Take 1 tablet (200 mg total) by mouth in the morning. 90 tablet 0   Cholecalciferol (VITAMIN D-3 PO) Take 50,000 Units by mouth once a week.     cyclobenzaprine (FLEXERIL) 5 MG tablet Take 1 tablet (5 mg total) by mouth 3 (three) times daily as needed for muscle spasms. (Patient not taking: Reported on 02/05/2023) 30 tablet 0   diphenhydrAMINE HCl (BENADRYL ALLERGY PO) Take by mouth as needed.     escitalopram (LEXAPRO) 20 MG tablet Take 1 tablet (20 mg total) by mouth daily. 90 tablet 0   hyoscyamine (LEVSIN SL) 0.125 MG SL tablet Place 1 tablet (0.125 mg total) under the tongue every 6 (six) hours as needed for cramping. 30 tablet 6   naproxen (NAPROSYN) 500 MG tablet Take 1 tablet (500 mg total) by mouth 2 (two) times daily with a meal. (Patient taking differently: Take 500 mg by mouth daily.) 14 tablet 0   omeprazole (PRILOSEC) 40 MG capsule Take 1 capsule (40 mg total) by mouth daily. 30 capsule 3   ZOLMitriptan (ZOMIG) 2.5 MG tablet Take 1 tablet (2.5 mg total) by mouth once for 1 dose. May repeat in 2 hours if headache persists or recurs. 10 tablet 1   No current facility-administered medications for this visit.    Allergies as of 03/28/2023 - Review Complete 02/12/2023  Allergen Reaction Noted   Aspirin Swelling and Other (See Comments) 05/28/2011   Augmentin [amoxicillin-pot clavulanate] Swelling 01/03/2018   Oxycodone Nausea And Vomiting 01/03/2018    Family History  Problem Relation Age of Onset   Colon polyps Mother    Colon cancer Mother    Diabetes Mother    Hypertension Mother    Cancer Mother 15       intestine and ovarian   Diverticulitis Mother    Depression Mother    Anxiety disorder Mother     Sleep apnea Mother    Stroke Father    Diabetes Father    Hypertension Father    Heart disease Father    Heart attack Father        x 2   Colon cancer Sister    Colon polyps Sister    Rectal cancer Sister    Cancer Sister 71       ovarian and intestine   Cancer Brother 24       GI cancer   Breast cancer Maternal Aunt 52  Cancer Paternal Aunt 54       stomach   Cancer Maternal Grandmother 25       ovarian and pancreatic   Cancer Cousin 39       ovarian   Cancer Cousin 47       breast   Crohn's disease Neg Hx    Esophageal cancer Neg Hx    Stomach cancer Neg Hx    Ulcerative colitis Neg Hx     Social History   Socioeconomic History   Marital status: Married    Spouse name: Marine scientist   Number of children: Not on file   Years of education: Not on file   Highest education level: Bachelor's degree (e.g., BA, AB, BS)  Occupational History   Occupation: Oceanographer  Tobacco Use   Smoking status: Some Days    Types: Cigarettes   Smokeless tobacco: Never  Vaping Use   Vaping status: Never Used  Substance and Sexual Activity   Alcohol use: Yes    Comment: occ   Drug use: No   Sexual activity: Not Currently    Partners: Male    Birth control/protection: Post-menopausal, Abstinence  Other Topics Concern   Not on file  Social History Narrative   Not on file   Social Determinants of Health   Financial Resource Strain: High Risk (10/23/2022)   Overall Financial Resource Strain (CARDIA)    Difficulty of Paying Living Expenses: Hard  Food Insecurity: Food Insecurity Present (10/23/2022)   Hunger Vital Sign    Worried About Running Out of Food in the Last Year: Sometimes true    Ran Out of Food in the Last Year: Often true  Transportation Needs: No Transportation Needs (10/23/2022)   PRAPARE - Administrator, Civil Service (Medical): No    Lack of Transportation (Non-Medical): No  Physical Activity: Sufficiently Active (10/23/2022)   Exercise  Vital Sign    Days of Exercise per Week: 4 days    Minutes of Exercise per Session: 60 min  Stress: No Stress Concern Present (10/23/2022)   [employer] of Occupational Health - Occupational Stress Questionnaire    Feeling of Stress : Only a little  Social Connections: Moderately Isolated (10/23/2022)   Social Connection and Isolation Panel [NHANES]    Frequency of Communication with Friends and Family: More than three times a week    Frequency of Social Gatherings with Friends and Family: Twice a week    Attends Religious Services: 1 to 4 times per year    Active Member of [employer] or Organizations: No    Attends Engineer, structural: Not on file    Marital Status: Separated  Intimate Partner Violence: Not on file    Review of Systems:    Constitutional: No weight loss, fever or chills Cardiovascular: No chest pain Respiratory: No SOB Gastrointestinal: See HPI and otherwise negative   Physical Exam:  Vital signs: BP 96/68 (BP Location: Left Arm, Patient Position: Sitting, Cuff Size: Normal)   Pulse 92   Ht 5' 7.5" (1.715 m) Comment: height measured without shoes  Wt 138 lb 6 oz (62.8 kg)   LMP 03/14/2018   BMI 21.35 kg/m    Constitutional:   Pleasant thin appearing female appears to be in NAD, Well developed, Well nourished, alert and cooperative Respiratory: Respirations even and unlabored. Lungs clear to auscultation bilaterally.   No wheezes, crackles, or rhonchi.  Cardiovascular: Normal S1, S2. No MRG. Regular rate and rhythm. No peripheral edema,  cyanosis or pallor.  Gastrointestinal:  Soft, nondistended, moderate RLQ ttp,  No rebound or guarding. Normal bowel sounds. No appreciable masses or hepatomegaly. Rectal:  Not performed.  Psychiatric: Demonstrates good judgement and reason without abnormal affect or behaviors.  RELEVANT LABS AND IMAGING: CBC    Component Value Date/Time   WBC 7.2 02/05/2023 0936   RBC 4.44 02/05/2023 0936   HGB 13.3 02/05/2023 0936    HGB 14.7 11/07/2021 1029   HCT 40.4 02/05/2023 0936   HCT 43.8 11/07/2021 1029   PLT 360.0 02/05/2023 0936   PLT 338 11/07/2021 1029   MCV 91.0 02/05/2023 0936   MCV 89 11/07/2021 1029   MCH 29.5 01/24/2022 1739   MCHC 33.0 02/05/2023 0936   RDW 13.7 02/05/2023 0936   RDW 12.6 11/07/2021 1029   LYMPHSABS 1.6 04/16/2022 1629   LYMPHSABS 2.0 11/07/2021 1029   MONOABS 0.5 04/16/2022 1629   EOSABS 0.2 04/16/2022 1629   EOSABS 0.3 11/07/2021 1029   BASOSABS 0.1 04/16/2022 1629   BASOSABS 0.1 11/07/2021 1029    CMP     Component Value Date/Time   NA 140 02/05/2023 0936   NA 141 11/07/2021 1029   K 4.6 02/05/2023 0936   CL 101 02/05/2023 0936   CO2 30 02/05/2023 0936   GLUCOSE 77 02/05/2023 0936   BUN 20 02/05/2023 0936   BUN 22 11/07/2021 1029   CREATININE 0.92 02/05/2023 0936   CREATININE 0.91 08/12/2016 0837   CALCIUM 10.2 02/05/2023 0936   PROT 7.4 08/16/2022 0923   PROT 7.6 11/07/2021 1029   ALBUMIN 4.5 08/16/2022 0923   ALBUMIN 5.0 (H) 11/07/2021 1029   AST 15 08/16/2022 0923   ALT 14 08/16/2022 0923   ALKPHOS 69 08/16/2022 0923   BILITOT 0.4 08/16/2022 0923   BILITOT <0.2 11/07/2021 1029   GFRNONAA >60 01/24/2022 1739    Assessment: 1.  Chronic right lower quadrant pain: Multiple imaging studies and recent colonoscopy, no etiology seen for pain in the past, consider scar tissue from appendectomy versus IBS versus musculoskeletal 2.  GERD with nausea/vomiting and regurgitation: Continues regardless of Omeprazole 40 mg daily, prior EGD the end of last year with gastritis and esophagitis, full evaluation of gallbladder in the past including HIDA scan which was normal; question possible gastroparesis versus gastritis versus functional dyspepsia 3.  Dysphagia: Now describing difficulty swallowing pills and some solid food; consider esophagitis +/- stricture  Plan: 1.  Ordered barium esophagram with tablet for further evaluation of dysphagia. 2.  Switched from  Omeprazole to Dexilant 60 mg daily #30 with 5 refills.  If her insurance does not cover would recommend Pantoprazole 40 mg twice daily. 3.  Refilled Zofran 4 mg every 6 hours as needed for nausea #30 with 2 refills. 4.  Discussed the element of possible abdominal wall pain, may benefit from referral for this in the future? 5.  I have set patient up for a follow-up with Dr. Adela Lank, his next available appointment is in January.  I feel it is very important for her to see him as she has not seen him in the past for this myriad of complaints and I would appreciate his opinion and expertise.  In the meantime she can check in with me and let me know how things are going.  Hyacinth Meeker, PA-C [facility] 03/28/2023, 8:55 AM  Cc: Mliss Sax

## 2023-04-03 ENCOUNTER — Emergency Department (HOSPITAL_COMMUNITY)
Admission: EM | Admit: 2023-04-03 | Discharge: 2023-04-03 | Disposition: A | Discharge status: Home / Self Care | Payer: 59

## 2023-04-03 ENCOUNTER — Emergency Department (HOSPITAL_COMMUNITY): Payer: 59

## 2023-04-03 DIAGNOSIS — S01111A Laceration without foreign body of right eyelid and periocular area, initial encounter: Secondary | ICD-10-CM | POA: Insufficient documentation

## 2023-04-03 DIAGNOSIS — W01198A Fall on same level from slipping, tripping and stumbling with subsequent striking against other object, initial encounter: Secondary | ICD-10-CM | POA: Insufficient documentation

## 2023-04-03 DIAGNOSIS — S0181XA Laceration without foreign body of other part of head, initial encounter: Secondary | ICD-10-CM

## 2023-04-03 DIAGNOSIS — S0990XA Unspecified injury of head, initial encounter: Secondary | ICD-10-CM | POA: Diagnosis not present

## 2023-04-03 DIAGNOSIS — S0993XA Unspecified injury of face, initial encounter: Secondary | ICD-10-CM | POA: Diagnosis present

## 2023-04-03 DIAGNOSIS — M25511 Pain in right shoulder: Secondary | ICD-10-CM | POA: Diagnosis not present

## 2023-04-03 DIAGNOSIS — W19XXXA Unspecified fall, initial encounter: Secondary | ICD-10-CM

## 2023-04-03 DIAGNOSIS — R55 Syncope and collapse: Secondary | ICD-10-CM | POA: Diagnosis not present

## 2023-04-03 LAB — BASIC METABOLIC PANEL
Anion gap: 11 (ref 5–15)
BUN: 24 mg/dL — ABNORMAL HIGH (ref 6–20)
CO2: 26 mmol/L (ref 22–32)
Calcium: 9.8 mg/dL (ref 8.9–10.3)
Chloride: 99 mmol/L (ref 98–111)
Creatinine, Ser: 0.93 mg/dL (ref 0.44–1.00)
GFR, Estimated: >60 mL/min (ref >60)
Glucose, Bld: 94 mg/dL (ref 70–99)
Potassium: 4.5 mmol/L (ref 3.5–5.1)
Sodium: 136 mmol/L (ref 135–145)

## 2023-04-03 LAB — CBC
HCT: 37.3 % (ref 36.0–46.0)
Hemoglobin: 11.9 g/dL — ABNORMAL LOW (ref 12.0–15.0)
MCH: 29.5 pg (ref 26.0–34.0)
MCHC: 31.9 g/dL (ref 30.0–36.0)
MCV: 92.3 fL (ref 80.0–100.0)
Platelets: 428 10*3/uL — ABNORMAL HIGH (ref 150–400)
RBC: 4.04 MIL/uL (ref 3.87–5.11)
RDW: 13 % (ref 11.5–15.5)
WBC: 9.1 10*3/uL (ref 4.0–10.5)
nRBC: 0 % (ref 0.0–0.2)

## 2023-04-03 LAB — CBG MONITORING, ED: Glucose-Capillary: 89 mg/dL (ref 70–99)

## 2023-04-03 MED ORDER — ONDANSETRON HCL 4 MG/2ML IJ SOLN
4.0000 mg | Freq: Once | INTRAMUSCULAR | Status: AC
  Administered 2023-04-03: 4 mg via INTRAVENOUS
  Filled 2023-04-03: qty 2

## 2023-04-03 MED ORDER — LIDOCAINE-EPINEPHRINE (PF) 2 %-1:200000 IJ SOLN
10.0000 mL | Freq: Once | INTRAMUSCULAR | Status: AC
  Administered 2023-04-03: 10 mL
  Filled 2023-04-03: qty 20

## 2023-04-03 MED ORDER — METOCLOPRAMIDE HCL 5 MG/ML IJ SOLN
10.0000 mg | Freq: Once | INTRAMUSCULAR | Status: AC
  Administered 2023-04-03: 10 mg via INTRAVENOUS
  Filled 2023-04-03: qty 2

## 2023-04-03 MED ORDER — KETOROLAC TROMETHAMINE 15 MG/ML IJ SOLN
15.0000 mg | Freq: Once | INTRAMUSCULAR | Status: AC
  Administered 2023-04-03: 15 mg via INTRAVENOUS
  Filled 2023-04-03: qty 1

## 2023-04-03 MED ORDER — SODIUM CHLORIDE 0.9 % IV BOLUS
1000.0000 mL | Freq: Once | INTRAVENOUS | Status: AC
  Administered 2023-04-03: 1000 mL via INTRAVENOUS

## 2023-04-03 MED ORDER — ACETAMINOPHEN 500 MG PO TABS
1000.0000 mg | ORAL_TABLET | Freq: Once | ORAL | Status: AC
  Administered 2023-04-03: 1000 mg via ORAL
  Filled 2023-04-03: qty 2

## 2023-04-03 NOTE — ED Triage Notes (Signed)
Pt. Stated, I got up around 0400 to pee and when I stood up I got dizzy and fell and hit my head (above rt. Eyebrow) Laceration 1 inch long. Bleeding controlled

## 2023-04-03 NOTE — ED Provider Notes (Signed)
EMERGENCY DEPARTMENT AT [facility] Provider Note   CSN: [id number] Arrival date & time: 04/03/23  1008     History  Chief Complaint  Patient presents with   Fall   Dizziness   Facial Laceration    Anne Lewis is a 57 y.o. female.  Anne Lewis is a 57 y.o. female with a history of migraines, seizures, sleep apnea, GERD, who presents to the emergency department for evaluation of lightheadedness, fall and facial laceration.  Patient reports that around 4 AM she got up quickly to use the bathroom and immediately felt lightheaded, she leaned against the wall and thought that she had to steady herself but then when she went to turn she fell striking her forehead on the corner of a piece of furniture.  She does not think that she completely lost consciousness.  After the episode she felt nauseated and had 1 episode of vomiting.  No chest pain, shortness of breath or palpitations.  Patient does have a history of seizures but typically has an aura with these and postictal period which she did not experience with this episode.  She was able to get up, shower and drive to work when she got to work they recommended she come and get checked out.  No blood thinners.  Aside from head injury patient also complains of some right shoulder pain.  The history is provided by the patient.  Fall Associated symptoms include headaches. Pertinent negatives include no chest pain, no abdominal pain and no shortness of breath.  Dizziness Associated symptoms: headaches, nausea and vomiting   Associated symptoms: no chest pain, no palpitations and no shortness of breath        Home Medications Prior to Admission medications   Medication Sig Start Date End Date Taking? Authorizing Provider  buPROPion (WELLBUTRIN SR) 200 MG 12 hr tablet Take 1 tablet (200 mg total) by mouth in the morning. 01/14/23   Quillian Quince D, MD  Cholecalciferol (VITAMIN D-3 PO) Take 50,000 Units by  mouth once a week.    [provider]  cyclobenzaprine (FLEXERIL) 5 MG tablet Take 1 tablet (5 mg total) by mouth 3 (three) times daily as needed for muscle spasms. 10/15/22   Olivia Mackie, NP  dexlansoprazole (DEXILANT) 60 MG capsule Take 1 capsule (60 mg total) by mouth daily. 03/28/23   Unk Lightning, PA  diphenhydrAMINE HCl (BENADRYL ALLERGY PO) Take by mouth as needed.    [provider]  escitalopram (LEXAPRO) 20 MG tablet Take 1 tablet (20 mg total) by mouth daily. 01/14/23   Quillian Quince D, MD  hyoscyamine (LEVSIN SL) 0.125 MG SL tablet Place 1 tablet (0.125 mg total) under the tongue every 6 (six) hours as needed for cramping. 11/22/21   Rachael Fee, MD  naproxen (NAPROSYN) 500 MG tablet Take 1 tablet (500 mg total) by mouth 2 (two) times daily with a meal. Patient taking differently: Take 500 mg by mouth daily. 10/25/22   Loyola Mast, MD  ondansetron (ZOFRAN) 4 MG tablet Take 1 tablet (4 mg total) by mouth every 6 (six) hours as needed for nausea or vomiting. 03/28/23   Unk Lightning, PA  ZOLMitriptan (ZOMIG) 2.5 MG tablet Take 1 tablet (2.5 mg total) by mouth once for 1 dose. May repeat in 2 hours if headache persists or recurs. 10/11/21 11/07/97  Mliss Sax, MD      Allergies    Aspirin, Augmentin [amoxicillin-pot clavulanate], and Oxycodone  Review of Systems   Review of Systems  Constitutional:  Negative for chills and fever.  HENT: Negative.    Respiratory:  Negative for shortness of breath.   Cardiovascular:  Negative for chest pain and palpitations.  Gastrointestinal:  Positive for nausea and vomiting. Negative for abdominal pain.  Musculoskeletal:  Positive for arthralgias. Negative for back pain, myalgias and neck pain.  Skin:  Positive for wound.  Neurological:  Positive for dizziness, light-headedness and headaches. Negative for seizures and syncope.    Physical Exam Updated Vital Signs BP 121/79   Pulse 70    Temp 98 F (36.7 C) (Oral)   Resp 13   LMP 03/14/2018   SpO2 100%  Physical Exam Vitals and nursing note reviewed.  Constitutional:      General: She is not in acute distress.    Appearance: Normal appearance. She is well-developed and normal weight. She is not ill-appearing or diaphoretic.  HENT:     Head: Normocephalic.     Comments: 3.5 cm laceration to the right forehead just above the eyebrow, some surrounding tenderness, but no bruising hematoma or step off, scalp otherwise NTTP, neg battle sign    Nose: Nose normal.  Eyes:     General:        Right eye: No discharge.        Left eye: No discharge.     Extraocular Movements: Extraocular movements intact.     Pupils: Pupils are equal, round, and reactive to light.  Neck:     Comments: Mild midline C-spine tenderness without step off Cardiovascular:     Rate and Rhythm: Normal rate and regular rhythm.     Pulses: Normal pulses.     Heart sounds: Normal heart sounds.  Pulmonary:     Effort: Pulmonary effort is normal. No respiratory distress.     Breath sounds: Normal breath sounds. No wheezing or rales.     Comments: Respirations equal and unlabored, patient able to speak in full sentences, lungs clear to auscultation bilaterally  Chest:     Chest wall: No tenderness.  Abdominal:     General: Bowel sounds are normal. There is no distension.     Palpations: Abdomen is soft. There is no mass.     Tenderness: There is no abdominal tenderness. There is no guarding.     Comments: Abdomen soft, nondistended, nontender to palpation  Musculoskeletal:        General: Tenderness present. No deformity.     Cervical back: Neck supple. Tenderness present.     Comments: Mild tenderness over the right shoulder without ecchymosis or deformity, ROM intact. All other joint supple and easily moveable  Skin:    General: Skin is warm and dry.     Capillary Refill: Capillary refill takes less than 2 seconds.  Neurological:     Mental  Status: She is alert and oriented to person, place, and time.     Coordination: Coordination normal.     Comments: Speech is clear, able to follow commands CN III-XII intact Normal strength in upper and lower extremities bilaterally including dorsiflexion and plantar flexion, strong and equal grip strength Sensation normal to light and sharp touch Moves extremities without ataxia, coordination intact     ED Results / Procedures / Treatments   Labs (all labs ordered are listed, but only abnormal results are displayed) Labs Reviewed  BASIC METABOLIC PANEL - Abnormal; Notable for the following components:      Result Value  BUN 24 (*)    All other components within normal limits  CBC - Abnormal; Notable for the following components:   Hemoglobin 11.9 (*)    Platelets 428 (*)    All other components within normal limits  URINALYSIS, ROUTINE W REFLEX MICROSCOPIC  CBG MONITORING, ED    EKG None  Radiology DG Shoulder Right  Result Date: 04/03/2023 CLINICAL DATA:  Fall pt. Stated, I got up around 0400 to pee and when I stood up I got dizzy and fell and hit my head (above rt. Eyebrow) Laceration 1 inch long. Bleeding controlled EXAM: RIGHT SHOULDER - 2+ VIEW COMPARISON:  None Available. FINDINGS: There is no evidence of fracture or dislocation. There is no evidence of arthropathy or other focal bone abnormality. Soft tissues are unremarkable. IMPRESSION: No acute displaced fracture or dislocation. Electronically Signed   By: Tish Frederickson M.D.   On: 04/03/2023 12:10   CT Head Wo Contrast  Result Date: 04/03/2023 CLINICAL DATA:  Neck trauma with midline tenderness. Moderate to severe head trauma EXAM: CT HEAD WITHOUT CONTRAST CT CERVICAL SPINE WITHOUT CONTRAST TECHNIQUE: Multidetector CT imaging of the head and cervical spine was performed following the standard protocol without intravenous contrast. Multiplanar CT image reconstructions of the cervical spine were also generated.  RADIATION DOSE REDUCTION: This exam was performed according to the departmental dose-optimization program which includes automated exposure control, adjustment of the mA and/or kV according to patient size and/or use of iterative reconstruction technique. COMPARISON:  None Available. FINDINGS: CT HEAD FINDINGS Brain: No evidence of acute infarction, hemorrhage, hydrocephalus, extra-axial collection or mass lesion/mass effect. Vascular: No hyperdense vessel or unexpected calcification. Skull: Small focus of anterior right scalp gas without underlying fracture. Sinuses/Orbits: No acute finding. CT CERVICAL SPINE FINDINGS Alignment: Reversal of cervical lordosis Skull base and vertebrae: No acute fracture. No primary bone lesion or focal pathologic process. Soft tissues and spinal canal: No prevertebral fluid or swelling. No visible canal hematoma. Disc levels: Disc narrowing with endplate spurring at C5-6 and C6-7. At least moderate right foraminal stenosis at C5-6. Upper chest: Negative IMPRESSION: 1. No evidence of intracranial or cervical spine injury. 2. Soft tissue gas at the anterior right scalp. No underlying fracture. Electronically Signed   By: Tiburcio Pea M.D.   On: 04/03/2023 12:01   CT Cervical Spine Wo Contrast  Result Date: 04/03/2023 CLINICAL DATA:  Neck trauma with midline tenderness. Moderate to severe head trauma EXAM: CT HEAD WITHOUT CONTRAST CT CERVICAL SPINE WITHOUT CONTRAST TECHNIQUE: Multidetector CT imaging of the head and cervical spine was performed following the standard protocol without intravenous contrast. Multiplanar CT image reconstructions of the cervical spine were also generated. RADIATION DOSE REDUCTION: This exam was performed according to the departmental dose-optimization program which includes automated exposure control, adjustment of the mA and/or kV according to patient size and/or use of iterative reconstruction technique. COMPARISON:  None Available. FINDINGS: CT  HEAD FINDINGS Brain: No evidence of acute infarction, hemorrhage, hydrocephalus, extra-axial collection or mass lesion/mass effect. Vascular: No hyperdense vessel or unexpected calcification. Skull: Small focus of anterior right scalp gas without underlying fracture. Sinuses/Orbits: No acute finding. CT CERVICAL SPINE FINDINGS Alignment: Reversal of cervical lordosis Skull base and vertebrae: No acute fracture. No primary bone lesion or focal pathologic process. Soft tissues and spinal canal: No prevertebral fluid or swelling. No visible canal hematoma. Disc levels: Disc narrowing with endplate spurring at C5-6 and C6-7. At least moderate right foraminal stenosis at C5-6. Upper chest: Negative IMPRESSION: 1. No  evidence of intracranial or cervical spine injury. 2. Soft tissue gas at the anterior right scalp. No underlying fracture. Electronically Signed   By: Tiburcio Pea M.D.   On: 04/03/2023 12:01    Procedures .Marland KitchenLaceration Repair  Date/Time: 04/03/2023 11:08 AM  Performed by: Dartha Lodge, PA-C Authorized by: Dartha Lodge, PA-C   Consent:    Consent obtained:  Verbal   Consent given by:  Patient   Risks, benefits, and alternatives were discussed: yes     Risks discussed:  Infection, pain, poor cosmetic result and need for additional repair   Alternatives discussed:  No treatment Universal protocol:    Procedure explained and questions answered to patient or proxy's satisfaction: yes     Patient identity confirmed:  Verbally with patient Anesthesia:    Anesthesia method:  Local infiltration Laceration details:    Location:  Face   Face location:  R eyebrow   Length (cm):  3.5   Depth (mm):  5 Pre-procedure details:    Preparation:  Patient was prepped and draped in usual sterile fashion Exploration:    Hemostasis achieved with:  Epinephrine and direct pressure   Wound exploration: entire depth of wound visualized     Wound extent: areolar tissue violated   Treatment:    Area  cleansed with:  Saline   Amount of cleaning:  Standard   Irrigation solution:  Sterile saline   Irrigation method:  Syringe   Debridement:  None   Undermining:  None   Scar revision: no   Skin repair:    Repair method:  Sutures   Suture size:  6-0   Suture material:  Prolene   Suture technique:  Simple interrupted   Number of sutures:  3 Approximation:    Approximation:  Close Repair type:    Repair type:  Simple Post-procedure details:    Dressing:  Open (no dressing)   Procedure completion:  Tolerated well, no immediate complications     Medications Ordered in ED Medications  sodium chloride 0.9 % bolus 1,000 mL (0 mLs Intravenous Stopped 04/03/23 1348)  ondansetron (ZOFRAN) injection 4 mg (4 mg Intravenous Given 04/03/23 1142)  lidocaine-EPINEPHrine (XYLOCAINE W/EPI) 2 %-1:200000 (PF) injection 10 mL (10 mLs Infiltration Given 04/03/23 1143)  acetaminophen (TYLENOL) tablet 1,000 mg (1,000 mg Oral Given 04/03/23 1142)  ketorolac (TORADOL) 15 MG/ML injection 15 mg (15 mg Intravenous Given 04/03/23 1410)  metoCLOPramide (REGLAN) injection 10 mg (10 mg Intravenous Given 04/03/23 1410)    ED Course/ Medical Decision Making/ A&P                                 Medical Decision Making Amount and/or Complexity of Data Reviewed Labs: ordered. Radiology: ordered.  Risk OTC drugs. Prescription drug management.   57 year old female presents after a fall with head injury after she stood up quickly from bed and felt dizzy, did not completely lose consciousness.  Differential includes near syncope, syncope, seizure, vertigo.  Patient does note that she has had some GI issues recently and has been dehydrated and thinks that this may have led to her symptoms.  Prior history of seizures but always has an aura with these and did not experience this or a postictal period was able to get up get dressed and go to work after the incident.  EKG shows normal sinus rhythm without ischemic  changes, lab work is reassuring with no electrolyte derangements, stable  hemoglobin and no leukocytosis.  CTs of the head and cervical spine obtained as well as x-rays of the right shoulder.  All imaging independently viewed and interpreted, no evidence of intracranial injury, skull or C-spine fracture, no right shoulder fracture or dislocation.  Forehead laceration closed with suture with good cosmesis and hemostasis.  Discussed instructions for suture removal and provided return precautions.  Discharged home in good condition.        Final Clinical Impression(s) / ED Diagnoses Final diagnoses:  Near syncope  Fall, initial encounter  Injury of head, initial encounter  Laceration of forehead, initial encounter    Rx / DC Orders ED Discharge Orders     None         Legrand Rams 04/04/23 2102    Durwin Glaze, MD 04/10/23 [phone]

## 2023-04-03 NOTE — ED Notes (Signed)
Patient transported to X-ray 

## 2023-04-03 NOTE — Discharge Instructions (Addendum)
  Your labs and imaging today were overall reassuring.  Make sure you are drinking plenty of fluids.  Try not to change positions suddenly, sit up on the bed first and then stand up to help prevent similar symptoms in the future.  Continue using the nausea medicine prescribed by your GI doctor as needed.  Return to the emergency department for new or worsening symptoms.  Follow-up with neurology as recommended by your primary care provider.

## 2023-04-03 NOTE — ED Notes (Signed)
Pt ambulated to bathroom, pt C/OC nausea and headache, PA notified

## 2023-04-03 NOTE — ED Notes (Signed)
Pt to xray

## 2023-04-10 ENCOUNTER — Ambulatory Visit (INDEPENDENT_AMBULATORY_CARE_PROVIDER_SITE_OTHER): Payer: 59 | Admitting: Family Medicine

## 2023-04-10 ENCOUNTER — Encounter (INDEPENDENT_AMBULATORY_CARE_PROVIDER_SITE_OTHER): Payer: Self-pay | Admitting: Family Medicine

## 2023-04-10 VITALS — BP 113/67 | HR 83 | Temp 98.1°F | Ht 68.0 in | Wt 137.0 lb

## 2023-04-10 DIAGNOSIS — R1313 Dysphagia, pharyngeal phase: Secondary | ICD-10-CM | POA: Insufficient documentation

## 2023-04-10 DIAGNOSIS — R1031 Right lower quadrant pain: Secondary | ICD-10-CM | POA: Diagnosis not present

## 2023-04-10 DIAGNOSIS — F3289 Other specified depressive episodes: Secondary | ICD-10-CM

## 2023-04-10 DIAGNOSIS — F32A Depression, unspecified: Secondary | ICD-10-CM | POA: Diagnosis not present

## 2023-04-10 DIAGNOSIS — E669 Obesity, unspecified: Secondary | ICD-10-CM

## 2023-04-10 DIAGNOSIS — R252 Cramp and spasm: Secondary | ICD-10-CM | POA: Diagnosis not present

## 2023-04-10 DIAGNOSIS — R1310 Dysphagia, unspecified: Secondary | ICD-10-CM | POA: Diagnosis not present

## 2023-04-10 DIAGNOSIS — E46 Unspecified protein-calorie malnutrition: Secondary | ICD-10-CM

## 2023-04-10 DIAGNOSIS — Z6820 Body mass index (BMI) 20.0-20.9, adult: Secondary | ICD-10-CM

## 2023-04-10 MED ORDER — ESCITALOPRAM OXALATE 20 MG PO TABS
20.0000 mg | ORAL_TABLET | Freq: Every day | ORAL | 0 refills | Status: DC
Start: 2023-04-10 — End: 2023-06-05

## 2023-04-10 MED ORDER — BUPROPION HCL ER (SR) 200 MG PO TB12
200.0000 mg | ORAL_TABLET | Freq: Every morning | ORAL | 0 refills | Status: DC
Start: 2023-04-10 — End: 2023-06-05

## 2023-04-10 NOTE — Progress Notes (Signed)
.[facility]  Office: [phone]  /  Fax: [phone]  WEIGHT SUMMARY AND BIOMETRICS  Anthropometric Measurements Height: 5\' 8"  (1.727 m) Weight: 137 lb (62.1 kg) BMI (Calculated): 20.84 Weight at Last Visit: 145 lb Weight Lost Since Last Visit: 9 lb Weight Gained Since Last Visit: 0 Starting Weight: 207 lb Total Weight Loss (lbs): 71 lb (32.2 kg)   Body Composition  Body Fat %: 30.1 % Fat Mass (lbs): 41.2 lbs Muscle Mass (lbs): 90.8 lbs Total Body Water (lbs): 67.6 lbs Visceral Fat Rating : 6   Other Clinical Data Fasting: No Labs: No Today's Visit #: 26 Starting Date: 11/07/21    Chief Complaint: OBESITY  History of Present Illness   The patient, with a complex medical history, presents with ongoing issues of dysphagia and right lower quadrant abdominal pain. The patient reports a recent fall with loss of consciousness, resulting in a head injury and subsequent headaches, visual disturbances, and sensitivity to noise and light. The patient also reports a significant weight loss, to the point of malnourishment, which is concerning.  The patient's dysphagia is described as difficulty swallowing solid foods, with a sensation of food getting stuck in the esophagus. This has led to a dietary shift towards softer foods and liquids. The patient also reports constant nausea, which has further impacted their ability to eat.  The patient's dysgeusia is characterized by a persistent altered taste, which has also contributed to decreased food intake. The patient reports a constant thirst and dehydration, which may be contributing to the patient's reported muscle spasms and cramps throughout the body.  The patient's right lower quadrant abdominal pain is described as a cramping sensation, which is exacerbated after eating. The pain is localized and does not radiate. The patient has a history of diverticulitis, and the pain is similar to previous episodes.  The patient's recent fall  resulted in a head injury, which has led to ongoing headaches, visual disturbances, and sensitivity to noise and light. The patient reports a blackout prior to the fall and does not recall the events leading up to it. The patient was taken to the emergency room following the fall, where a CT scan was performed, and stitches were applied to a head wound.  The patient's significant weight loss has resulted in malnourishment. Despite attempts to maintain a healthy diet, the patient's ongoing dysphagia, dysgeusia, and abdominal pain have made it difficult to consume enough calories. The patient reports feeling weak and fatigued and has noticed an increase in muscle spasms and cramps, which may be related to the malnourishment and dehydration.  The patient's complex medical issues are causing significant distress and impacting their quality of life. The patient is actively seeking solutions and is working with multiple healthcare providers to manage their symptoms and improve their health.          PHYSICAL EXAM:  Blood pressure 113/67, pulse 83, temperature 98.1 F (36.7 C), height 5\' 8"  (1.727 m), weight 137 lb (62.1 kg), last menstrual period 03/14/2018, SpO2 100%. Body mass index is 20.83 kg/m.  DIAGNOSTIC DATA REVIEWED:  BMET    Component Value Date/Time   NA 136 04/03/2023 1024   NA 141 11/07/2021 1029   K 4.5 04/03/2023 1024   CL 99 04/03/2023 1024   CO2 26 04/03/2023 1024   GLUCOSE 94 04/03/2023 1024   BUN 24 (H) 04/03/2023 1024   BUN 22 11/07/2021 1029   CREATININE 0.93 04/03/2023 1024   CREATININE 0.91 08/12/2016 0837   CALCIUM 9.8 04/03/2023  1024   GFRNONAA >60 04/03/2023 1024   Lab Results  Component Value Date   HGBA1C 5.6 02/05/2023   HGBA1C 5.9 08/30/2019   Lab Results  Component Value Date   INSULIN 19.9 11/07/2021   Lab Results  Component Value Date   TSH 0.537 11/07/2021   CBC    Component Value Date/Time   WBC 9.1 04/03/2023 1024   RBC 4.04 04/03/2023  1024   HGB 11.9 (L) 04/03/2023 1024   HGB 14.7 11/07/2021 1029   HCT 37.3 04/03/2023 1024   HCT 43.8 11/07/2021 1029   PLT 428 (H) 04/03/2023 1024   PLT 338 11/07/2021 1029   MCV 92.3 04/03/2023 1024   MCV 89 11/07/2021 1029   MCH 29.5 04/03/2023 1024   MCHC 31.9 04/03/2023 1024   RDW 13.0 04/03/2023 1024   RDW 12.6 11/07/2021 1029   Iron Studies No results found for: "IRON", "TIBC", "FERRITIN", "IRONPCTSAT" Lipid Panel     Component Value Date/Time   CHOL 221 (H) 11/07/2021 1029   TRIG 125 11/07/2021 1029   HDL 44 11/07/2021 1029   CHOLHDL 5.0 (H) 11/07/2021 1029   CHOLHDL 6 06/18/2021 1135   VLDL 66.2 (H) 06/18/2021 1135   LDLCALC 154 (H) 11/07/2021 1029   LDLDIRECT 156.0 06/18/2021 1135   Hepatic Function Panel     Component Value Date/Time   PROT 7.4 08/16/2022 0923   PROT 7.6 11/07/2021 1029   ALBUMIN 4.5 08/16/2022 0923   ALBUMIN 5.0 (H) 11/07/2021 1029   AST 15 08/16/2022 0923   ALT 14 08/16/2022 0923   ALKPHOS 69 08/16/2022 0923   BILITOT 0.4 08/16/2022 0923   BILITOT <0.2 11/07/2021 1029   BILIDIR 0.1 04/16/2022 1629      Component Value Date/Time   TSH 0.537 11/07/2021 1029   Nutritional Lab Results  Component Value Date   VD25OH 69.0 04/30/2022   VD25OH 24.2 (L) 11/07/2021   VD25OH 12.59 (L) 06/18/2021     Assessment and Plan    Fall with Loss of Consciousness Patient experienced a fall with loss of consciousness, resulting in a head injury. CT scan showed no intracranial bleeding. Patient reports ongoing headaches, sensitivity to light and noise, and difficulty with vision since the fall. -Continue monitoring symptoms and seek immediate medical attention if symptoms worsen. -increase hydration to at least 80 or more oz of H2O as dehydration increases fall risks.  Malnutrition Patient has lost significant weight and is now officially malnourished. This is likely contributing to her symptoms of fatigue, muscle cramps, and possibly her recent  fall. -Start high-protein shakes, three times a day to prevent further weight loss and improve nutritional status, premiere protein and Fairlie shakes recommended, but pt also given a recipe for shakes made at home with Whole milk, full fat yogurt and frozen fruit. -There may be some psychosomatic component to her abdominal pain, and her pain may be contributing to developing an eating disorder although she recognizes the health risks of being underweight and appears to be actively interested in improving her nutritional status.  -I will follow closely (8 weeks) and may place a referral to an eating order specialist to help rule in (or out) an eating disorder if her nutritional status does not improve.  Dysphagia Patient reports difficulty swallowing, with food getting stuck in her esophagus. This is likely contributing to her malnutrition as she has been hesitant to eat or drink much due to this -She was educated on doing her upcoming barium swallow study to assess  esophageal motility and identify any strictures or spasms. -She will drink thickened liquid shakes as this seems to help prevent her from chocking. -She will follow up with her GI doctor for further instructions.  Abdominal Pain Patient reports right lower quadrant abdominal pain, particularly after eating. Previous CT scan showed diverticulosis without active inflammation. It is possible there may be some psychosomatic component of her pain -Refer back to GI specialist for further evaluation and management.   Depression Patient is under significant stress due to family circumstances and is currently on Wellbutrin and Lexapro. -Continue Wellbutrin and Lexapro. Monitor mental health closely and consider referral to mental health specialist if symptoms worsen.  Muscle Cramps Patient reports widespread muscle cramps. This could be due to electrolyte imbalances related to her malnutrition. -Order lab work to check electrolytes, vitamins,  thyroid function, magnesium, phosphorus, B12, and folate. -Recommend over-the-counter magnesium supplement, taken at night, to help with muscle relaxation.  Follow-up in 8 weeks to monitor weight, nutritional status, and response to interventions. If weight continues to decrease, consider more aggressive interventions.         I have personally spent 56 minutes total time today in preparation, patient care, and documentation for this visit, including the following: review of clinical lab tests; review of medical tests/procedures/services.    She was informed of the importance of frequent follow up visits to maximize her success with intensive lifestyle modifications for her multiple health conditions.    Quillian Quince, MD

## 2023-04-11 LAB — CBC WITH DIFFERENTIAL/PLATELET
Basophils Absolute: 0.1 10*3/uL (ref 0.0–0.2)
Basos: 1 %
EOS (ABSOLUTE): 0.2 10*3/uL (ref 0.0–0.4)
Eos: 3 %
Hematocrit: 38.6 % (ref 34.0–46.6)
Hemoglobin: 12.3 g/dL (ref 11.1–15.9)
Immature Grans (Abs): 0 10*3/uL (ref 0.0–0.1)
Immature Granulocytes: 0 %
Lymphocytes Absolute: 2 10*3/uL (ref 0.7–3.1)
Lymphs: 35 %
MCH: 30.1 pg (ref 26.6–33.0)
MCHC: 31.9 g/dL (ref 31.5–35.7)
MCV: 95 fL (ref 79–97)
Monocytes Absolute: 0.3 10*3/uL (ref 0.1–0.9)
Monocytes: 6 %
Neutrophils Absolute: 3.3 10*3/uL (ref 1.4–7.0)
Neutrophils: 55 %
Platelets: 407 10*3/uL (ref 150–450)
RBC: 4.08 x10E6/uL (ref 3.77–5.28)
RDW: 12.8 % (ref 11.7–15.4)
WBC: 5.8 10*3/uL (ref 3.4–10.8)

## 2023-04-11 LAB — FOLATE: Folate: 6.6 ng/mL (ref >3.0)

## 2023-04-11 LAB — CMP14+EGFR
ALT: 12 IU/L (ref 0–32)
AST: 16 IU/L (ref 0–40)
Albumin: 4.7 g/dL (ref 3.8–4.9)
Alkaline Phosphatase: 70 IU/L (ref 44–121)
BUN/Creatinine Ratio: 28 — ABNORMAL HIGH (ref 9–23)
BUN: 25 mg/dL — ABNORMAL HIGH (ref 6–24)
Bilirubin Total: 0.3 mg/dL (ref 0.0–1.2)
CO2: 23 mmol/L (ref 20–29)
Calcium: 10.1 mg/dL (ref 8.7–10.2)
Chloride: 100 mmol/L (ref 96–106)
Creatinine, Ser: 0.89 mg/dL (ref 0.57–1.00)
Globulin, Total: 2.3 g/dL (ref 1.5–4.5)
Glucose: 75 mg/dL (ref 70–99)
Potassium: 4.6 mmol/L (ref 3.5–5.2)
Sodium: 140 mmol/L (ref 134–144)
Total Protein: 7 g/dL (ref 6.0–8.5)
eGFR: 76 mL/min/{1.73_m2} (ref >59)

## 2023-04-11 LAB — MAGNESIUM: Magnesium: 2.2 mg/dL (ref 1.6–2.3)

## 2023-04-11 LAB — TSH: TSH: 0.687 u[IU]/mL (ref 0.450–4.500)

## 2023-04-11 LAB — PHOSPHORUS: Phosphorus: 4.5 mg/dL — ABNORMAL HIGH (ref 3.0–4.3)

## 2023-04-11 LAB — VITAMIN D 25 HYDROXY (VIT D DEFICIENCY, FRACTURES): Vit D, 25-Hydroxy: 45.2 ng/mL (ref 30.0–100.0)

## 2023-04-11 LAB — VITAMIN B12: Vitamin B-12: 456 pg/mL (ref 232–1245)

## 2023-04-14 ENCOUNTER — Ambulatory Visit (HOSPITAL_COMMUNITY)
Admission: RE | Admit: 2023-04-14 | Discharge: 2023-04-14 | Disposition: A | Discharge status: Home / Self Care | Payer: 59 | Source: Ambulatory Visit | Attending: Physician Assistant | Admitting: Physician Assistant

## 2023-04-14 DIAGNOSIS — R1031 Right lower quadrant pain: Secondary | ICD-10-CM | POA: Insufficient documentation

## 2023-04-14 DIAGNOSIS — R112 Nausea with vomiting, unspecified: Secondary | ICD-10-CM | POA: Insufficient documentation

## 2023-04-14 DIAGNOSIS — R1310 Dysphagia, unspecified: Secondary | ICD-10-CM | POA: Diagnosis present

## 2023-04-14 DIAGNOSIS — K219 Gastro-esophageal reflux disease without esophagitis: Secondary | ICD-10-CM | POA: Diagnosis present

## 2023-04-14 DIAGNOSIS — G8929 Other chronic pain: Secondary | ICD-10-CM | POA: Diagnosis present

## 2023-04-21 ENCOUNTER — Telehealth: Payer: 59 | Admitting: Nurse Practitioner

## 2023-04-21 ENCOUNTER — Telehealth: Payer: Self-pay | Admitting: Pharmacy Technician

## 2023-04-21 DIAGNOSIS — R3989 Other symptoms and signs involving the genitourinary system: Secondary | ICD-10-CM | POA: Diagnosis not present

## 2023-04-21 MED ORDER — NITROFURANTOIN MONOHYD MACRO 100 MG PO CAPS
100.0000 mg | ORAL_CAPSULE | Freq: Two times a day (BID) | ORAL | 0 refills | Status: AC
Start: 2023-04-21 — End: 2023-04-26

## 2023-04-21 NOTE — Telephone Encounter (Signed)
Pharmacy Patient Advocate Encounter   Received notification from CoverMyMeds that prior authorization for DEXLANSOPRAZOLE 60MG  is required/requested.   Insurance verification completed.   The patient is insured through [employer] [facility] .   Per test claim: PA required; PA submitted to [employer] [facility] via CoverMyMeds Key/confirmation #/EOC [id number] Status is pending

## 2023-04-21 NOTE — Progress Notes (Signed)
E-Visit for Urinary Problems  We are sorry that you are not feeling well.  Here is how we plan to help!  Based on what you shared with me it looks like you most likely have a simple urinary tract infection.  A UTI (Urinary Tract Infection) is a bacterial infection of the bladder.  Most cases of urinary tract infections are simple to treat but a key part of your care is to encourage you to drink plenty of fluids and watch your symptoms carefully.  I have prescribed MacroBid 100 mg twice a day for 5 days.  Your symptoms should gradually improve. Call us if the burning in your urine worsens, you develop worsening fever, back pain or pelvic pain or if your symptoms do not resolve after completing the antibiotic.  Urinary tract infections can be prevented by drinking plenty of water to keep your body hydrated.  Also be sure when you wipe, wipe from front to back and don't hold it in!  If possible, empty your bladder every 4 hours.  HOME CARE Drink plenty of fluids Compete the full course of the antibiotics even if the symptoms resolve Remember, when you need to go.go. Holding in your urine can increase the likelihood of getting a UTI! GET HELP RIGHT AWAY IF: You cannot urinate You get a high fever Worsening back pain occurs You see blood in your urine You feel sick to your stomach or throw up You feel like you are going to pass out  MAKE SURE YOU  Understand these instructions. Will watch your condition. Will get help right away if you are not doing well or get worse.   Thank you for choosing an e-visit.  Your e-visit answers were reviewed by a board certified advanced clinical practitioner to complete your personal care plan. Depending upon the condition, your plan could have included both over the counter or prescription medications.  Please review your pharmacy choice. Make sure the pharmacy is open so you can pick up prescription now. If there is a problem, you may contact your  provider through MyChart messaging and have the prescription routed to another pharmacy.  Your safety is important to us. If you have drug allergies check your prescription carefully.   For the next 24 hours you can use MyChart to ask questions about today's visit, request a non-urgent call back, or ask for a work or school excuse. You will get an email in the next two days asking about your experience. I hope that your e-visit has been valuable and will speed your recovery.   Meds ordered this encounter  Medications   nitrofurantoin, macrocrystal-monohydrate, (MACROBID) 100 MG capsule    Sig: Take 1 capsule (100 mg total) by mouth 2 (two) times daily for 5 days.    Dispense:  10 capsule    Refill:  0     I spent approximately 5 minutes reviewing the patient's history, current symptoms and coordinating their care today.   

## 2023-04-23 ENCOUNTER — Encounter (INDEPENDENT_AMBULATORY_CARE_PROVIDER_SITE_OTHER): Payer: Self-pay | Admitting: Family Medicine

## 2023-04-24 ENCOUNTER — Encounter: Payer: Self-pay | Admitting: Family Medicine

## 2023-04-28 ENCOUNTER — Telehealth: Payer: Self-pay | Admitting: Family Medicine

## 2023-04-28 NOTE — Telephone Encounter (Signed)
Pt said she still have stitches in her forehead since 9.12.24 and she want to know when can she get them removed

## 2023-04-29 ENCOUNTER — Ambulatory Visit: Payer: 59 | Admitting: Family Medicine

## 2023-04-29 ENCOUNTER — Encounter: Payer: Self-pay | Admitting: Family Medicine

## 2023-04-29 VITALS — BP 110/64 | HR 79 | Temp 98.0°F | Ht 68.0 in | Wt 138.8 lb

## 2023-04-29 DIAGNOSIS — Z4802 Encounter for removal of sutures: Secondary | ICD-10-CM

## 2023-04-29 DIAGNOSIS — Z23 Encounter for immunization: Secondary | ICD-10-CM | POA: Diagnosis not present

## 2023-04-29 NOTE — Progress Notes (Signed)
Established Patient Office Visit   Subjective:  Patient ID: Anne Lewis, female    DOB: 07/06/1966  Age: 57 y.o. MRN: [id number]  Chief Complaint  Patient presents with   Suture / Staple Removal    ED visit September 12th received sutures    HPI Encounter Diagnoses  Name Primary?   Visit for suture removal Yes   Need for immunization against influenza    For follow-up of suture removal from sutures placed back on the 12th of last month.  Did not realize she was supposed to be in sooner.  Wound is healed nicely.  There has been no discharge swelling or streaking. Subjective:   Pertinent items are noted in HPI. Anne Lewis is a 57 y.o. female who obtained a laceration 3 week ago, which required closure with 3 suture. Mechanism of injury: fall. She denies pain, redness, or drainage from the wound. Her last tetanus was 5 years ago.    Review of Systems     Objective:    BP 110/64   Pulse 79   Temp 98 F (36.7 C) (Temporal)   Ht 5\' 8"  (1.727 m)   Wt 138 lb 12.8 oz (63 kg)   LMP 03/14/2018   SpO2 100%   BMI 21.10 kg/m  Injury exam:  A 3.5 cm laceration noted on the right mid forehead is healing well, without evidence of infection.    Assessment:    Laceration is healing well, without evidence of infection.    Plan:     1. 3 sutures were removed. 2. Wound care discussed. 3. Follow up as needed.  Patient Active Problem List   Diagnosis Date Noted   Right lower quadrant pain 04/10/2023   Protein-calorie malnutrition (HCC) 04/10/2023   Pharyngeal dysphagia 04/10/2023   GI problem 02/12/2023   BMI 20.0-20.9, adult 10/22/2022   Gastroesophageal reflux disease with esophagitis without hemorrhage 08/08/2022   Elevated blood pressure reading without diagnosis of hypertension 08/08/2022   Nausea and vomiting in adult 04/30/2022   Depression 04/30/2022   Generalized obesity 04/30/2022   Depression with anxiety 03/19/2022   Facial cellulitis 01/24/2022    Ecchymosis 12/11/2021   Rash 12/11/2021   Pre-diabetes 12/11/2021   Acute appendicitis 09/03/2021   Perimenopausal 06/18/2021   Vitamin D deficiency 06/18/2021   Intractable headache 06/18/2021   Sciatica of right side 01/11/2021   Paresthesias 08/30/2019   Weight gain 08/30/2019   Dysthymia 08/30/2019   Reactive airway disease 10/06/2018   Osteoarthritis of spine with radiculopathy, cervical region 04/16/2018   Urticaria 04/16/2018   Tooth pain 04/16/2018   Acute non-recurrent sinusitis 08/19/2017   Lateral epicondylitis of left elbow 08/19/2017   Cough 08/19/2017   Healthcare maintenance 08/19/2017   Family history of malignant neoplasm of breast 11/08/2013   Family history of malignant neoplasm of gastrointestinal tract 11/08/2013   Family history of malignant neoplasm of ovary 11/08/2013   RUQ PAIN 08/08/2010   DYSPEPSIA&OTHER SPEC DISORDERS FUNCTION STOMACH 08/07/2010   ABDOMINAL PAIN, EPIGASTRIC 08/07/2010   OTHER CONSTIPATION 12/05/2009      Review of Systems  Constitutional: Negative.   HENT: Negative.    Eyes:  Negative for blurred vision, discharge and redness.  Respiratory: Negative.    Cardiovascular: Negative.   Gastrointestinal:  Negative for abdominal pain.  Genitourinary: Negative.   Musculoskeletal: Negative.  Negative for myalgias.  Skin:  Negative for rash.  Neurological:  Negative for tingling, loss of consciousness and weakness.  Endo/Heme/Allergies:  Negative for polydipsia.  Current Outpatient Medications:    buPROPion (WELLBUTRIN SR) 200 MG 12 hr tablet, Take 1 tablet (200 mg total) by mouth in the morning., Disp: 90 tablet, Rfl: 0   Cholecalciferol (VITAMIN D-3 PO), Take 50,000 Units by mouth once a week., Disp: , Rfl:    cyclobenzaprine (FLEXERIL) 5 MG tablet, Take 1 tablet (5 mg total) by mouth 3 (three) times daily as needed for muscle spasms., Disp: 30 tablet, Rfl: 0   diphenhydrAMINE HCl (BENADRYL ALLERGY PO), Take by mouth as  needed., Disp: , Rfl:    escitalopram (LEXAPRO) 20 MG tablet, Take 1 tablet (20 mg total) by mouth daily., Disp: 90 tablet, Rfl: 0   hyoscyamine (LEVSIN SL) 0.125 MG SL tablet, Place 1 tablet (0.125 mg total) under the tongue every 6 (six) hours as needed for cramping., Disp: 30 tablet, Rfl: 6   naproxen (NAPROSYN) 500 MG tablet, Take 1 tablet (500 mg total) by mouth 2 (two) times daily with a meal. (Patient taking differently: Take 500 mg by mouth daily.), Disp: 14 tablet, Rfl: 0   ondansetron (ZOFRAN) 4 MG tablet, Take 1 tablet (4 mg total) by mouth every 6 (six) hours as needed for nausea or vomiting., Disp: 30 tablet, Rfl: 3   ZOLMitriptan (ZOMIG) 2.5 MG tablet, Take 1 tablet (2.5 mg total) by mouth once for 1 dose. May repeat in 2 hours if headache persists or recurs., Disp: 10 tablet, Rfl: 1   dexlansoprazole (DEXILANT) 60 MG capsule, Take 1 capsule (60 mg total) by mouth daily. (Patient not taking: Reported on 04/29/2023), Disp: 30 capsule, Rfl: 11   Objective:     BP 110/64   Pulse 79   Temp 98 F (36.7 C) (Temporal)   Ht 5\' 8"  (1.727 m)   Wt 138 lb 12.8 oz (63 kg)   LMP 03/14/2018   SpO2 100%   BMI 21.10 kg/m    Physical Exam Constitutional:      General: She is not in acute distress.    Appearance: Normal appearance. She is not ill-appearing, toxic-appearing or diaphoretic.  HENT:     Head: Normocephalic and atraumatic.     Right Ear: External ear normal.     Left Ear: External ear normal.  Eyes:     General: No scleral icterus.       Right eye: No discharge.        Left eye: No discharge.     Extraocular Movements: Extraocular movements intact.     Conjunctiva/sclera: Conjunctivae normal.  Pulmonary:     Effort: Pulmonary effort is normal. No respiratory distress.  Skin:    General: Skin is warm and dry.       Neurological:     Mental Status: She is alert and oriented to person, place, and time.  Psychiatric:        Mood and Affect: Mood normal.         Behavior: Behavior normal.      No results found for any visits on 04/29/23.    The 10-year ASCVD risk score (Arnett DK, et al., 2019) is: 5.5%    Assessment & Plan:   Visit for suture removal  Need for immunization against influenza -     Flu vaccine trivalent PF, 6mos and older(Flulaval,Afluria,Fluarix,Fluzone)    No follow-ups on file.    Mliss Sax, MD

## 2023-04-30 NOTE — Telephone Encounter (Signed)
Pharmacy Patient Advocate Encounter  Received notification from [facility] that Prior Authorization for DEXLANSOPRAZOLE 60MG  has been DENIED.  Full denial letter will be uploaded to the media tab. See denial reason below.   PA #/Case ID/Reference #: [id number]     Appeal has been submitted. Will advise when response is received or follow up in 1 week. Please be advised that most companies may take 30 days to make a decision.

## 2023-05-17 ENCOUNTER — Other Ambulatory Visit: Payer: Self-pay | Admitting: Medical Genetics

## 2023-05-17 DIAGNOSIS — Z006 Encounter for examination for normal comparison and control in clinical research program: Secondary | ICD-10-CM

## 2023-06-05 ENCOUNTER — Ambulatory Visit (INDEPENDENT_AMBULATORY_CARE_PROVIDER_SITE_OTHER): Payer: 59 | Admitting: Family Medicine

## 2023-06-05 ENCOUNTER — Encounter (INDEPENDENT_AMBULATORY_CARE_PROVIDER_SITE_OTHER): Payer: Self-pay | Admitting: Family Medicine

## 2023-06-05 VITALS — BP 111/61 | HR 61 | Temp 98.1°F | Ht 68.0 in | Wt 131.0 lb

## 2023-06-05 DIAGNOSIS — Z681 Body mass index (BMI) 19 or less, adult: Secondary | ICD-10-CM

## 2023-06-05 DIAGNOSIS — F3289 Other specified depressive episodes: Secondary | ICD-10-CM

## 2023-06-05 DIAGNOSIS — K5900 Constipation, unspecified: Secondary | ICD-10-CM

## 2023-06-05 DIAGNOSIS — E46 Unspecified protein-calorie malnutrition: Secondary | ICD-10-CM

## 2023-06-05 DIAGNOSIS — F32A Depression, unspecified: Secondary | ICD-10-CM

## 2023-06-05 DIAGNOSIS — R109 Unspecified abdominal pain: Secondary | ICD-10-CM | POA: Diagnosis not present

## 2023-06-05 DIAGNOSIS — E669 Obesity, unspecified: Secondary | ICD-10-CM

## 2023-06-05 DIAGNOSIS — R1310 Dysphagia, unspecified: Secondary | ICD-10-CM

## 2023-06-05 DIAGNOSIS — Z6820 Body mass index (BMI) 20.0-20.9, adult: Secondary | ICD-10-CM

## 2023-06-05 MED ORDER — ESCITALOPRAM OXALATE 20 MG PO TABS
20.0000 mg | ORAL_TABLET | Freq: Every day | ORAL | 0 refills | Status: DC
Start: 2023-06-05 — End: 2023-07-31

## 2023-06-05 NOTE — Progress Notes (Signed)
.smr  Office: [phone]  /  Fax: [phone]  WEIGHT SUMMARY AND BIOMETRICS  Anthropometric Measurements Height: 5\' 8"  (1.727 m) Weight: 131 lb (59.4 kg) BMI (Calculated): 19.92 Weight at Last Visit: 137 lb Weight Lost Since Last Visit: 6 lb Weight Gained Since Last Visit: 0 Starting Weight: 207 lb Total Weight Loss (lbs): 77 lb (34.9 kg)   Body Composition  Body Fat %: 28.7 % Fat Mass (lbs): 37.8 lbs Muscle Mass (lbs): 88.8 lbs Total Body Water (lbs): 68 lbs Visceral Fat Rating : 5   Other Clinical Data Fasting: No Labs: No Today's Visit #: 27 Starting Date: 11/07/21    Chief Complaint: OBESITY   Discussed the use of AI scribe software for clinical note transcription with the patient, who gave verbal consent to proceed.  History of Present Illness   The patient presents with ongoing abdominal pain, dysphagia, and significant weight loss, resulting in malnourishment with a BMI of 19. The patient has lost an additional six pounds in the last two months. Despite adherence to a 1200 calorie, 75g protein diet and regular exercise, the weight loss continues. The patient's resting metabolic rate was last measured at 1771 in April 2023. The patient is scheduled to see a GI specialist in January.  The patient describes the abdominal pain as severe and radiating, primarily on the right side. The pain is influenced by food intake and varies in location. The discomfort is so severe that the patient uses a pillow for support when sleeping to alleviate the pressure. The patient also reports issues with bowel movements, requiring four scoops of Miralax to achieve a bowel movement, which is often liquid in consistency. The patient has noticed a delay in the digestion of certain foods, such as corn.  The patient is also dealing with depression, currently managed with Wellbutrin and Lexapro. The patient has a history of falls and is experiencing ongoing issues from a previous trauma,  which continues to cause pain. The patient also reports episodes of low blood pressure and has not yet received an appointment with a neurologist. The patient is due to participate in a genetic research study.  Despite these health issues, the patient is maintaining a high level of physical activity, exercising for sixty minutes four times a week. The patient is also making efforts to increase caloric intake, consuming whole milk, fruits, pasta, and homemade soups with a variety of vegetables and meats. However, the patient reports that certain foods, such as potatoes and corn, do not settle well. The patient is also taking supplements to manage hair loss and skin issues.          PHYSICAL EXAM:  Blood pressure 111/61, pulse 61, temperature 98.1 F (36.7 C), height 5\' 8"  (1.727 m), weight 131 lb (59.4 kg), last menstrual period 03/14/2018, SpO2 100%. Body mass index is 19.92 kg/m.  DIAGNOSTIC DATA REVIEWED:  BMET    Component Value Date/Time   NA 140 04/10/2023 1124   K 4.6 04/10/2023 1124   CL 100 04/10/2023 1124   CO2 23 04/10/2023 1124   GLUCOSE 75 04/10/2023 1124   GLUCOSE 94 04/03/2023 1024   BUN 25 (H) 04/10/2023 1124   CREATININE 0.89 04/10/2023 1124   CREATININE 0.91 08/12/2016 0837   CALCIUM 10.1 04/10/2023 1124   GFRNONAA >60 04/03/2023 1024   Lab Results  Component Value Date   HGBA1C 5.6 02/05/2023   HGBA1C 5.9 08/30/2019   Lab Results  Component Value Date   INSULIN 19.9 11/07/2021  Lab Results  Component Value Date   TSH 0.687 04/10/2023   CBC    Component Value Date/Time   WBC 5.8 04/10/2023 1124   WBC 9.1 04/03/2023 1024   RBC 4.08 04/10/2023 1124   RBC 4.04 04/03/2023 1024   HGB 12.3 04/10/2023 1124   HCT 38.6 04/10/2023 1124   PLT 407 04/10/2023 1124   MCV 95 04/10/2023 1124   MCH 30.1 04/10/2023 1124   MCH 29.5 04/03/2023 1024   MCHC 31.9 04/10/2023 1124   MCHC 31.9 04/03/2023 1024   RDW 12.8 04/10/2023 1124   Iron Studies No results  found for: "IRON", "TIBC", "FERRITIN", "IRONPCTSAT" Lipid Panel     Component Value Date/Time   CHOL 221 (H) 11/07/2021 1029   TRIG 125 11/07/2021 1029   HDL 44 11/07/2021 1029   CHOLHDL 5.0 (H) 11/07/2021 1029   CHOLHDL 6 06/18/2021 1135   VLDL 66.2 (H) 06/18/2021 1135   LDLCALC 154 (H) 11/07/2021 1029   LDLDIRECT 156.0 06/18/2021 1135   Hepatic Function Panel     Component Value Date/Time   PROT 7.0 04/10/2023 1124   ALBUMIN 4.7 04/10/2023 1124   AST 16 04/10/2023 1124   ALT 12 04/10/2023 1124   ALKPHOS 70 04/10/2023 1124   BILITOT 0.3 04/10/2023 1124   BILIDIR 0.1 04/16/2022 1629      Component Value Date/Time   TSH 0.687 04/10/2023 1124   Nutritional Lab Results  Component Value Date   VD25OH 45.2 04/10/2023   VD25OH 69.0 04/30/2022   VD25OH 24.2 (L) 11/07/2021     Assessment and Plan    Abdominal Pain and Dysphagia Severe abdominal pain and dysphagia with weight loss and malnutrition (BMI 19). Differential includes GI disorders such as scar tissue, diverticulitis, or other GI pathology. Requires expedited GI evaluation due to worsening symptoms. Discussed risks of bowel obstruction and emergency care.  - Call GI clinic to expedite appointment   Malnutrition Emphasized benefits of registered dietician and 1800 calorie/day intake. Consider eating disorder assessment.  -Refer to registered dietician for meal planning - Recommend 1800 calorie intake per day and 100+ gm of protein daily - Will refer to [facility] for eating disorder assessment   Constipation Chronic constipation managed with Miralax, requiring increased dosage for bowel movements, resulting in liquid stools. Concern for potential bowel obstruction or other GI pathology. Discussed risks of bowel obstruction and emergency care. - Continue Miralax as needed - Monitor for signs of bowel obstruction - Advise to seek emergency care if symptoms worsen -Pt to decrease MiraLAX dose to 1x per day until GI asses  her and gives their treatment plan.  Depression Depression managed with Wellbutrin and Lexapro. Potential psychological component contributing to weight loss and malnutrition. Emphasized importance of continuing medications and monitoring mental health. - Continue Lexapro -DC Wellbutrin due to weight loss -depending on workup, may change Lexapro to Paxil to help with weight gain  General Health Maintenance Recent health fair results: A1c 4.7 per pt at health fair, cholesterol levels normal. Discussed benefits of women's one-a-day multivitamin, preferably chewable, for nutritional deficiencies. - Recommend women's one-a-day multivitamin, preferably chewable  Follow-up - Schedule follow-up appointment after the holidays - Advise to contact via MyChart if no updates by Monday.         I have personally spent 40 minutes total time today in preparation, patient care, and documentation for this visit, including the following: review of clinical lab tests; review of medical tests/procedures/services.    She was informed of the importance  of frequent follow up visits to maximize her success with intensive lifestyle modifications for her multiple health conditions.    Quillian Quince, MD

## 2023-06-06 ENCOUNTER — Encounter (INDEPENDENT_AMBULATORY_CARE_PROVIDER_SITE_OTHER): Payer: Self-pay | Admitting: *Deleted

## 2023-06-09 ENCOUNTER — Telehealth (INDEPENDENT_AMBULATORY_CARE_PROVIDER_SITE_OTHER): Payer: Self-pay | Admitting: *Deleted

## 2023-06-09 DIAGNOSIS — R1310 Dysphagia, unspecified: Secondary | ICD-10-CM

## 2023-06-09 DIAGNOSIS — K2100 Gastro-esophageal reflux disease with esophagitis, without bleeding: Secondary | ICD-10-CM

## 2023-06-09 DIAGNOSIS — R634 Abnormal weight loss: Secondary | ICD-10-CM

## 2023-06-09 DIAGNOSIS — R198 Other specified symptoms and signs involving the digestive system and abdomen: Secondary | ICD-10-CM

## 2023-06-09 NOTE — Telephone Encounter (Signed)
Patient returned call and agreed to be seen by someone in [place]. Patient would prefer either early morning or late afternoon or on a Friday.

## 2023-06-09 NOTE — Telephone Encounter (Signed)
Left message for patient to return call to follow up on the messages from last week. [facility] is able to get patient in to be seen sooner if she is willing to travel to their office.

## 2023-06-09 NOTE — Telephone Encounter (Signed)
Spoke with Byrd Hesselbach from [facility]. She was given patient's information for eating disorder to be evaluated. She will call the patient to get her scheduled, however they do not accept insurance. If patient is agreeable to be seen and pay out of pocket, she will be scheduled. Byrd Hesselbach will email to let me know either way, if the patient schedules or not.

## 2023-06-09 NOTE — Telephone Encounter (Signed)
Referral sent to [facility], they will follow up with patient.

## 2023-06-10 ENCOUNTER — Other Ambulatory Visit (HOSPITAL_COMMUNITY): Payer: 59

## 2023-06-23 NOTE — Telephone Encounter (Signed)
Called [facility] to follow up on referral sent on 06/09/2023. Spoke with Marchelle Folks and she stated the referral was closed, it appears it was closed prior to be scheduled. She stated usually when the patient is already established with another office, they do not usually schedule at a different location.

## 2023-07-13 ENCOUNTER — Telehealth: Payer: 59 | Admitting: Family Medicine

## 2023-07-13 DIAGNOSIS — J069 Acute upper respiratory infection, unspecified: Secondary | ICD-10-CM

## 2023-07-13 MED ORDER — FLUTICASONE PROPIONATE 50 MCG/ACT NA SUSP: 2.0000 | Freq: Every day | NASAL | 6 refills | Status: DC

## 2023-07-13 MED ORDER — BENZONATATE 200 MG PO CAPS: 200.0000 mg | ORAL_CAPSULE | Freq: Two times a day (BID) | ORAL | 0 refills | Status: DC | PRN

## 2023-07-13 NOTE — Progress Notes (Signed)

## 2023-07-14 ENCOUNTER — Other Ambulatory Visit (HOSPITAL_COMMUNITY): Payer: 59

## 2023-07-15 ENCOUNTER — Other Ambulatory Visit: Payer: Self-pay

## 2023-07-15 ENCOUNTER — Encounter (HOSPITAL_BASED_OUTPATIENT_CLINIC_OR_DEPARTMENT_OTHER): Payer: Self-pay | Admitting: Emergency Medicine

## 2023-07-15 ENCOUNTER — Emergency Department (HOSPITAL_BASED_OUTPATIENT_CLINIC_OR_DEPARTMENT_OTHER)
Admission: EM | Admit: 2023-07-15 | Discharge: 2023-07-15 | Disposition: A | Discharge status: Home / Self Care | Payer: 59 | Attending: Emergency Medicine | Admitting: Emergency Medicine

## 2023-07-15 ENCOUNTER — Other Ambulatory Visit (HOSPITAL_BASED_OUTPATIENT_CLINIC_OR_DEPARTMENT_OTHER): Payer: Self-pay

## 2023-07-15 DIAGNOSIS — K047 Periapical abscess without sinus: Secondary | ICD-10-CM | POA: Insufficient documentation

## 2023-07-15 DIAGNOSIS — K0889 Other specified disorders of teeth and supporting structures: Secondary | ICD-10-CM | POA: Diagnosis present

## 2023-07-15 MED ORDER — IBUPROFEN 800 MG PO TABS
800.0000 mg | ORAL_TABLET | Freq: Once | ORAL | Status: AC
  Administered 2023-07-15: 800 mg via ORAL
  Filled 2023-07-15: qty 1

## 2023-07-15 MED ORDER — BUPIVACAINE HCL 0.5 % IJ SOLN
5.0000 mL | Freq: Once | INTRAMUSCULAR | Status: AC
  Administered 2023-07-15: 5 mL
  Filled 2023-07-15: qty 1

## 2023-07-15 MED ORDER — CLINDAMYCIN HCL 150 MG PO CAPS
450.0000 mg | ORAL_CAPSULE | Freq: Once | ORAL | Status: AC
  Administered 2023-07-15: 450 mg via ORAL
  Filled 2023-07-15: qty 3

## 2023-07-15 MED ORDER — HYDROCODONE-ACETAMINOPHEN 5-325 MG PO TABS
1.0000 | ORAL_TABLET | Freq: Once | ORAL | Status: AC
  Administered 2023-07-15: 1 via ORAL
  Filled 2023-07-15: qty 1

## 2023-07-15 MED ORDER — CLINDAMYCIN HCL 150 MG PO CAPS
450.0000 mg | ORAL_CAPSULE | Freq: Three times a day (TID) | ORAL | 0 refills | Status: AC
Start: 2023-07-15 — End: 2023-07-22
  Filled 2023-07-15: qty 63, 7d supply, fill #0

## 2023-07-15 NOTE — ED Provider Notes (Signed)
[facility] Provider Note   CSN: [id number] Arrival date & time: 07/15/23  1057     History  Chief Complaint  Patient presents with   Dental Pain    Anne Lewis is a 58 y.o. female.  Patient is a 57 year old female with no significant past medical history presenting to the emergency department with dental pain.  Patient states for the last few days she has been having some sinus headaches and was diagnosed with a viral syndrome by her PCP.  She states however over the last 2 days she started to develop some pain and swelling in her right lower tooth.  She states that the pain is radiating to her right ear.  She states that she has been having fevers at home.  She denies any drainage.  She denies any cough, congestion or sore throat.  The history is provided by the patient.  Dental Pain      Home Medications Prior to Admission medications   Medication Sig Start Date End Date Taking? Authorizing Provider  clindamycin (CLEOCIN) 150 MG capsule Take 3 capsules (450 mg total) by mouth 3 (three) times daily for 7 days. 07/15/23 07/22/23 Yes Theresia Lo, Benetta Spar K, DO  benzonatate (TESSALON) 200 MG capsule Take 1 capsule (200 mg total) by mouth 2 (two) times daily as needed for cough. 07/13/23   Delorse Lek, FNP  Cholecalciferol (VITAMIN D-3 PO) Take 50,000 Units by mouth once a week.    [provider]  cyclobenzaprine (FLEXERIL) 5 MG tablet Take 1 tablet (5 mg total) by mouth 3 (three) times daily as needed for muscle spasms. 10/15/22   Olivia Mackie, NP  dexlansoprazole (DEXILANT) 60 MG capsule Take 1 capsule (60 mg total) by mouth daily. Patient not taking: Reported on 06/05/2023 03/28/23   Unk Lightning, PA  diphenhydrAMINE HCl (BENADRYL ALLERGY PO) Take by mouth as needed.    [provider]  escitalopram (LEXAPRO) 20 MG tablet Take 1 tablet (20 mg total) by mouth daily. 06/05/23   Quillian Quince D,  MD  fluticasone (FLONASE) 50 MCG/ACT nasal spray Place 2 sprays into both nostrils daily. 07/13/23   Delorse Lek, FNP  hyoscyamine (LEVSIN SL) 0.125 MG SL tablet Place 1 tablet (0.125 mg total) under the tongue every 6 (six) hours as needed for cramping. 11/22/21   Rachael Fee, MD  naproxen (NAPROSYN) 500 MG tablet Take 1 tablet (500 mg total) by mouth 2 (two) times daily with a meal. Patient taking differently: Take 500 mg by mouth daily. 10/25/22   Loyola Mast, MD  omeprazole (PRILOSEC) 40 MG capsule Take 40 mg by mouth daily. 04/01/23   [provider]  ondansetron (ZOFRAN) 4 MG tablet Take 1 tablet (4 mg total) by mouth every 6 (six) hours as needed for nausea or vomiting. 03/28/23   Unk Lightning, PA  ZOLMitriptan (ZOMIG) 2.5 MG tablet Take 1 tablet (2.5 mg total) by mouth once for 1 dose. May repeat in 2 hours if headache persists or recurs. 10/11/21 11/07/97  Mliss Sax, MD      Allergies    Aspirin, Augmentin [amoxicillin-pot clavulanate], and Oxycodone    Review of Systems   Review of Systems  Physical Exam Updated Vital Signs BP 106/66   Pulse (!) 103   Temp 98.6 F (37 C) (Oral)   Resp 18   Wt 58.1 kg   LMP 03/14/2018   SpO2 100%   BMI  19.46 kg/m  Physical Exam Vitals and nursing note reviewed.  Constitutional:      General: She is not in acute distress.    Appearance: Normal appearance.  HENT:     Head: Normocephalic and atraumatic.     Right Ear: Tympanic membrane, ear canal and external ear normal.     Nose: Nose normal.     Mouth/Throat:     Mouth: Mucous membranes are moist.      Comments: No swelling below the tongue or submandibular swelling, normal phonation, tolerating secretions, no trismus Eyes:     Extraocular Movements: Extraocular movements intact.     Conjunctiva/sclera: Conjunctivae normal.  Cardiovascular:     Rate and Rhythm: Normal rate.  Pulmonary:     Effort: Pulmonary effort is normal.  Abdominal:      General: Abdomen is flat.  Musculoskeletal:        General: Normal range of motion.     Cervical back: Normal range of motion and neck supple.  Lymphadenopathy:     Cervical: No cervical adenopathy.  Skin:    General: Skin is warm and dry.  Neurological:     General: No focal deficit present.     Mental Status: She is alert and oriented to person, place, and time.  Psychiatric:        Mood and Affect: Mood normal.        Behavior: Behavior normal.     ED Results / Procedures / Treatments   Labs (all labs ordered are listed, but only abnormal results are displayed) Labs Reviewed - No data to display  EKG None  Radiology No results found.  Procedures .Incision and Drainage  Date/Time: 07/15/2023 12:14 PM  Performed by: Rexford Maus, DO Authorized by: Rexford Maus, DO   Consent:    Consent obtained:  Verbal   Consent given by:  Patient   Risks, benefits, and alternatives were discussed: yes     Risks discussed:  Bleeding, incomplete drainage and pain   Alternatives discussed:  No treatment and delayed treatment Universal protocol:    Procedure explained and questions answered to patient or proxy's satisfaction: yes     Patient identity confirmed:  Verbally with patient Location:    Type:  Abscess   Size:  1 cm   Location:  Mouth   Mouth location:  Alveolar process Sedation:    Sedation type:  None Anesthesia:    Anesthesia method:  Nerve block   Block location:  Tooth #32   Block needle gauge:  27 G   Block anesthetic:  Bupivacaine 0.25% w/o epi   Block technique:  Alveolar block   Block injection procedure:  Anatomic landmarks identified, introduced needle, incremental injection, negative aspiration for blood and anatomic landmarks palpated   Block outcome:  Incomplete block Procedure type:    Complexity:  Simple Procedure details:    Ultrasound guidance: no     Needle aspiration: yes     Needle size:  18 G   Incision depth:  Dermal    Drainage:  Purulent   Drainage amount:  Scant   Wound treatment:  Wound left open   Packing materials:  None Post-procedure details:    Procedure completion:  Procedure terminated electively by provider Comments:     Incomplete drainage due to incomplete anesthesia. Re-attempted block without success. Small amount of purulence was expressed and patient recommended warm compresses and salt water rinses to continue to encourage drainage     Medications Ordered  in ED Medications  ibuprofen (ADVIL) tablet 800 mg (800 mg Oral Given 07/15/23 1127)  clindamycin (CLEOCIN) capsule 450 mg (450 mg Oral Given 07/15/23 1127)  bupivacaine (MARCAINE) 0.5 % (with pres) injection 5 mL (5 mLs Infiltration Given 07/15/23 1128)  HYDROcodone-acetaminophen (NORCO/VICODIN) 5-325 MG per tablet 1 tablet (1 tablet Oral Given 07/15/23 1210)    ED Course/ Medical Decision Making/ A&P Clinical Course as of 07/15/23 1255  Tue Jul 15, 2023  1154 Attempted I&D, patient unable to tolerate with incomplete anesthesia. Will be given heat pack and percocet and she would like another attempt at the dental block. [VK]  1253 Unable to complete I&D due to lack of complete anesthesia. Patient will trial antibiotics and have close dental follow up and was given strict return precautions. [VK]    Clinical Course User Index [VK] Rexford Maus, DO                                 Medical Decision Making This patient presents to the ED with chief complaint(s) of dental pain with no pertinent past medical history which further complicates the presenting complaint. The complaint involves an extensive differential diagnosis and also carries with it a high risk of complications and morbidity.    The differential diagnosis includes patient has evidence of dental abscess on exam, no evidence of otitis media or externa, no signs of Ludwick's or other deep space infection  Additional history obtained: Additional history obtained  from N/A Records reviewed Primary Care Documents  ED Course and Reassessment: On patient's arrival she is hemodynamically stable in no acute distress.  She does have evidence of palpable periapical abscess on her exam.  Will plan for dental block and drainage.  Will be started on antibiotics.  Independent labs interpretation:  N/A  Independent visualization of imaging: - N/A  Consultation: - Consulted or discussed management/test interpretation w/ external professional: N/A  Consideration for admission or further workup: Patient has no emergent conditions requiring admission or further work-up at this time and is stable for discharge home with dental follow-up  Social Determinants of health: N/A    Risk Prescription drug management.          Final Clinical Impression(s) / ED Diagnoses Final diagnoses:  Dental abscess    Rx / DC Orders ED Discharge Orders          Ordered    clindamycin (CLEOCIN) 150 MG capsule  3 times daily        07/15/23 1212              Elayne Snare K, DO 07/15/23 1255

## 2023-07-15 NOTE — ED Triage Notes (Signed)
Right lower dental pain x 2 days , reports swelling and unable to eat .

## 2023-07-15 NOTE — Discharge Instructions (Addendum)
You were seen in the emergency department for your dental pain.  You did have an abscess and we were able to drain part of the abscess.  You can continue to do salt water rinses and warm compresses to help encourage drainage.  I have given you a prescription of antibiotics you should complete this as prescribed.  You can take Tylenol Motrin every 6 hours as needed for pain.  You should follow-up with your dentist to have your mouth rechecked least within the next week.  You should return to the emergency department for increased swelling especially if you are having swelling underneath your jaw/chin, fevers despite the antibiotics, increased pain and you are unable to open your mouth due to the pain or if you have any other new or concerning symptoms.

## 2023-07-18 ENCOUNTER — Telehealth: Payer: Self-pay

## 2023-07-18 NOTE — Transitions of Care (Post Inpatient/ED Visit) (Signed)
   07/18/2023  Name: Anne Lewis MRN: [id number] DOB: [birth year]  Today's TOC FU Call Status: Today's TOC FU Call Status:: Unsuccessful Call (1st Attempt) Unsuccessful Call (1st Attempt) Date: 07/18/23  Attempted to reach the patient regarding the most recent Inpatient/ED visit.  Follow Up Plan: Additional outreach attempts will be made to reach the patient to complete the Transitions of Care (Post Inpatient/ED visit) call.   Derenda Fennel, RMA

## 2023-07-25 ENCOUNTER — Encounter (HOSPITAL_BASED_OUTPATIENT_CLINIC_OR_DEPARTMENT_OTHER): Payer: Self-pay

## 2023-07-25 ENCOUNTER — Emergency Department (HOSPITAL_BASED_OUTPATIENT_CLINIC_OR_DEPARTMENT_OTHER)
Admission: EM | Admit: 2023-07-25 | Discharge: 2023-07-25 | Disposition: A | Discharge status: Home / Self Care | Payer: 59 | Attending: Emergency Medicine | Admitting: Emergency Medicine

## 2023-07-25 ENCOUNTER — Other Ambulatory Visit: Payer: Self-pay

## 2023-07-25 ENCOUNTER — Emergency Department (HOSPITAL_BASED_OUTPATIENT_CLINIC_OR_DEPARTMENT_OTHER): Payer: 59

## 2023-07-25 DIAGNOSIS — K529 Noninfective gastroenteritis and colitis, unspecified: Secondary | ICD-10-CM | POA: Diagnosis not present

## 2023-07-25 DIAGNOSIS — Z20822 Contact with and (suspected) exposure to covid-19: Secondary | ICD-10-CM | POA: Insufficient documentation

## 2023-07-25 DIAGNOSIS — R1031 Right lower quadrant pain: Secondary | ICD-10-CM | POA: Diagnosis present

## 2023-07-25 LAB — CBC
HCT: 38.8 % (ref 36.0–46.0)
Hemoglobin: 13.1 g/dL (ref 12.0–15.0)
MCH: 29.8 pg (ref 26.0–34.0)
MCHC: 33.8 g/dL (ref 30.0–36.0)
MCV: 88.2 fL (ref 80.0–100.0)
Platelets: 367 10*3/uL (ref 150–400)
RBC: 4.4 MIL/uL (ref 3.87–5.11)
RDW: 13 % (ref 11.5–15.5)
WBC: 6.8 10*3/uL (ref 4.0–10.5)
nRBC: 0 % (ref 0.0–0.2)

## 2023-07-25 LAB — COMPREHENSIVE METABOLIC PANEL
ALT: 20 U/L (ref 0–44)
AST: 18 U/L (ref 15–41)
Albumin: 4.5 g/dL (ref 3.5–5.0)
Alkaline Phosphatase: 53 U/L (ref 38–126)
Anion gap: 9 (ref 5–15)
BUN: 28 mg/dL — ABNORMAL HIGH (ref 6–20)
CO2: 24 mmol/L (ref 22–32)
Calcium: 10.1 mg/dL (ref 8.9–10.3)
Chloride: 104 mmol/L (ref 98–111)
Creatinine, Ser: 0.88 mg/dL (ref 0.44–1.00)
GFR, Estimated: >60 mL/min (ref >60)
Glucose, Bld: 92 mg/dL (ref 70–99)
Potassium: 4.5 mmol/L (ref 3.5–5.1)
Sodium: 137 mmol/L (ref 135–145)
Total Bilirubin: 0.8 mg/dL (ref 0.0–1.2)
Total Protein: 7.4 g/dL (ref 6.5–8.1)

## 2023-07-25 LAB — URINALYSIS, ROUTINE W REFLEX MICROSCOPIC
Bilirubin Urine: NEGATIVE
Glucose, UA: NEGATIVE mg/dL
Hgb urine dipstick: NEGATIVE
Ketones, ur: NEGATIVE mg/dL
Leukocytes,Ua: NEGATIVE
Nitrite: NEGATIVE
Protein, ur: NEGATIVE mg/dL
Specific Gravity, Urine: 1.025 (ref 1.005–1.030)
pH: 6 (ref 5.0–8.0)

## 2023-07-25 LAB — LIPASE, BLOOD: Lipase: 41 U/L (ref 11–51)

## 2023-07-25 LAB — RESP PANEL BY RT-PCR (RSV, FLU A&B, COVID)  RVPGX2
Influenza A by PCR: NEGATIVE
Influenza B by PCR: NEGATIVE
Resp Syncytial Virus by PCR: NEGATIVE
SARS Coronavirus 2 by RT PCR: NEGATIVE

## 2023-07-25 LAB — MAGNESIUM: Magnesium: 2.1 mg/dL (ref 1.7–2.4)

## 2023-07-25 MED ORDER — ONDANSETRON HCL 4 MG PO TABS
4.0000 mg | ORAL_TABLET | Freq: Four times a day (QID) | ORAL | 0 refills | Status: DC | PRN
Start: 2023-07-25 — End: 2023-10-07

## 2023-07-25 MED ORDER — FENTANYL CITRATE PF 50 MCG/ML IJ SOSY
25.0000 ug | PREFILLED_SYRINGE | Freq: Once | INTRAMUSCULAR | Status: AC
  Administered 2023-07-25: 25 ug via INTRAVENOUS
  Filled 2023-07-25: qty 1

## 2023-07-25 MED ORDER — SODIUM CHLORIDE 0.9 % IV BOLUS
1000.0000 mL | Freq: Once | INTRAVENOUS | Status: AC
  Administered 2023-07-25: 1000 mL via INTRAVENOUS

## 2023-07-25 MED ORDER — ONDANSETRON HCL 4 MG/2ML IJ SOLN
4.0000 mg | Freq: Once | INTRAMUSCULAR | Status: AC
  Administered 2023-07-25: 4 mg via INTRAVENOUS
  Filled 2023-07-25: qty 2

## 2023-07-25 MED ORDER — IOHEXOL 300 MG/ML  SOLN
75.0000 mL | Freq: Once | INTRAMUSCULAR | Status: AC | PRN
  Administered 2023-07-25: 75 mL via INTRAVENOUS

## 2023-07-25 NOTE — ED Notes (Signed)
 ED Provider at bedside.

## 2023-07-25 NOTE — ED Provider Notes (Signed)
 [facility] Provider Note   CSN: [id number] Arrival date & time: 07/25/23  0857     History  Chief Complaint  Patient presents with   Abdominal Pain   Headache   Emesis    Anne Lewis is a 58 y.o. female.  Patient is a 58 year old female presenting for complaints of abdominal pain.  Patient admits to right lower quadrant abdominal pain with radiation to the right flank and right groin area, nausea, vomiting, diarrhea.  Past medical history significant for recent dental abscess with clindamycin  use.  Patient does admit to frequent bouts of diarrhea described as watery like stools.  Denies hematochezia or melena.  She admits to nausea and nonbloody emesis.  Patient also admits to feelings of dehydration including headache, muscle cramping, and decreased urine production.  The history is provided by the patient. No language interpreter was used.  Abdominal Pain Associated symptoms: diarrhea, nausea and vomiting   Associated symptoms: no chest pain, no chills, no cough, no dysuria, no fever, no hematuria, no shortness of breath and no sore throat   Headache Associated symptoms: abdominal pain, diarrhea, nausea and vomiting   Associated symptoms: no back pain, no cough, no ear pain, no eye pain, no fever, no seizures and no sore throat   Emesis Associated symptoms: abdominal pain, diarrhea and headaches   Associated symptoms: no arthralgias, no chills, no cough, no fever and no sore throat        Home Medications Prior to Admission medications   Medication Sig Start Date End Date Taking? Authorizing Provider  ondansetron  (ZOFRAN ) 4 MG tablet Take 1 tablet (4 mg total) by mouth every 6 (six) hours as needed for nausea or vomiting. 07/25/23  Yes Elnor Hila P, DO  benzonatate  (TESSALON ) 200 MG capsule Take 1 capsule (200 mg total) by mouth 2 (two) times daily as needed for cough. 07/13/23   Blair, Diane W, FNP  Cholecalciferol  (VITAMIN D -3 PO) Take 50,000 Units by mouth once a week.    [provider]  cyclobenzaprine  (FLEXERIL ) 5 MG tablet Take 1 tablet (5 mg total) by mouth 3 (three) times daily as needed for muscle spasms. 10/15/22   Prentiss Annabella LABOR, NP  dexlansoprazole  (DEXILANT ) 60 MG capsule Take 1 capsule (60 mg total) by mouth daily. Patient not taking: Reported on 06/05/2023 03/28/23   Beather Delon Gibson, PA  diphenhydrAMINE  HCl (BENADRYL  ALLERGY PO) Take by mouth as needed.    [provider]  escitalopram  (LEXAPRO ) 20 MG tablet Take 1 tablet (20 mg total) by mouth daily. 06/05/23   Verdon Parry D, MD  fluticasone  (FLONASE ) 50 MCG/ACT nasal spray Place 2 sprays into both nostrils daily. 07/13/23   Blair, Diane W, FNP  hyoscyamine  (LEVSIN  SL) 0.125 MG SL tablet Place 1 tablet (0.125 mg total) under the tongue every 6 (six) hours as needed for cramping. 11/22/21   Teressa Toribio SQUIBB, MD  naproxen  (NAPROSYN ) 500 MG tablet Take 1 tablet (500 mg total) by mouth 2 (two) times daily with a meal. Patient taking differently: Take 500 mg by mouth daily. 10/25/22   Thedora Garnette [name], MD  omeprazole  (PRILOSEC) 40 MG capsule Take 40 mg by mouth daily. 04/01/23   [provider]  ZOLMitriptan  (ZOMIG ) 2.5 MG tablet Take 1 tablet (2.5 mg total) by mouth once for 1 dose. May repeat in 2 hours if headache persists or recurs. 10/11/21 11/07/97  Berneta Elsie Sayre, MD      Allergies  Aspirin, Augmentin [amoxicillin -pot clavulanate], and Oxycodone     Review of Systems   Review of Systems  Constitutional:  Negative for chills and fever.  HENT:  Negative for ear pain and sore throat.   Eyes:  Negative for pain and visual disturbance.  Respiratory:  Negative for cough and shortness of breath.   Cardiovascular:  Negative for chest pain and palpitations.  Gastrointestinal:  Positive for abdominal pain, diarrhea, nausea and vomiting.  Genitourinary:  Negative for dysuria and hematuria.   Musculoskeletal:  Negative for arthralgias and back pain.  Skin:  Negative for color change and rash.  Neurological:  Positive for headaches. Negative for seizures and syncope.  All other systems reviewed and are negative.   Physical Exam Updated Vital Signs BP 126/83   Pulse 78   Temp 98.3 F (36.8 C)   Resp 20   Ht 5' 8 (1.727 m)   Wt 58.1 kg   LMP 03/14/2018   SpO2 100%   BMI 19.46 kg/m  Physical Exam Vitals and nursing note reviewed.  Constitutional:      General: She is not in acute distress.    Appearance: She is well-developed.  HENT:     Head: Normocephalic and atraumatic.  Eyes:     Conjunctiva/sclera: Conjunctivae normal.  Cardiovascular:     Rate and Rhythm: Normal rate and regular rhythm.     Heart sounds: No murmur heard. Pulmonary:     Effort: Pulmonary effort is normal. No respiratory distress.     Breath sounds: Normal breath sounds.  Abdominal:     Palpations: Abdomen is soft.     Tenderness: There is abdominal tenderness in the right lower quadrant and suprapubic area. There is no guarding or rebound.  Musculoskeletal:        General: No swelling.     Cervical back: Neck supple.  Skin:    General: Skin is warm and dry.     Capillary Refill: Capillary refill takes less than 2 seconds.  Neurological:     Mental Status: She is alert.  Psychiatric:        Mood and Affect: Mood normal.     ED Results / Procedures / Treatments   Labs (all labs ordered are listed, but only abnormal results are displayed) Labs Reviewed  COMPREHENSIVE METABOLIC PANEL - Abnormal; Notable for the following components:      Result Value   BUN 28 (*)    All other components within normal limits  RESP PANEL BY RT-PCR (RSV, FLU A&B, COVID)  RVPGX2  C DIFFICILE QUICK SCREEN W PCR REFLEX    LIPASE, BLOOD  CBC  URINALYSIS, ROUTINE W REFLEX MICROSCOPIC  MAGNESIUM    EKG None  Radiology CT ABDOMEN PELVIS W CONTRAST Result Date: 07/25/2023 CLINICAL DATA:  RLQ  abdominal pain nausea, diarrhea, rlq abdominal pain EXAM: CT ABDOMEN AND PELVIS WITH CONTRAST TECHNIQUE: Multidetector CT imaging of the abdomen and pelvis was performed using the standard protocol following bolus administration of intravenous contrast. RADIATION DOSE REDUCTION: This exam was performed according to the departmental dose-optimization program which includes automated exposure control, adjustment of the mA and/or kV according to patient size and/or use of iterative reconstruction technique. CONTRAST:  75mL OMNIPAQUE  IOHEXOL  300 MG/ML  SOLN COMPARISON:  CT scan abdomen and pelvis from 08/12/2022. FINDINGS: Lower chest: The lung bases are clear. No pleural effusion. The heart is normal in size. No pericardial effusion. Hepatobiliary: The liver is normal in size. Non-cirrhotic configuration. No suspicious mass. No intrahepatic or  extrahepatic bile duct dilation. No calcified gallstones. Normal gallbladder wall thickness. No pericholecystic inflammatory changes. Pancreas: Unremarkable. No pancreatic ductal dilatation or surrounding inflammatory changes. Spleen: Within normal limits. No focal lesion. Adrenals/Urinary Tract: Adrenal glands are unremarkable. No suspicious renal mass. No hydronephrosis. No renal or ureteric calculi. Unremarkable urinary bladder. Stomach/Bowel: No disproportionate dilation of the small or large bowel loops. No evidence of abnormal bowel wall thickening or inflammatory changes. The appendix was not visualized; however there is no acute inflammatory process in the right lower quadrant. There are scattered diverticula mainly in the left hemi colon, without imaging signs of diverticulitis. Vascular/Lymphatic: No ascites or pneumoperitoneum. No abdominal or pelvic lymphadenopathy, by size criteria. No aneurysmal dilation of the major abdominal arteries. There are mild peripheral atherosclerotic vascular calcifications of the aorta and its major branches. Reproductive: The uterus is  unremarkable. No large adnexal mass. Other: The visualized soft tissues and abdominal wall are unremarkable. Musculoskeletal: No suspicious osseous lesions. There are mild multilevel degenerative changes in the visualized spine. IMPRESSION: 1. No acute inflammatory process identified within the abdomen or pelvis. 2. Multiple other nonacute observations, as described above. Electronically Signed   By: Ree Molt M.D.   On: 07/25/2023 14:01    Procedures Procedures    Medications Ordered in ED Medications  sodium chloride  0.9 % bolus 1,000 mL (0 mLs Intravenous Stopped 07/25/23 1457)  fentaNYL  (SUBLIMAZE ) injection 25 mcg (25 mcg Intravenous Given 07/25/23 1132)  ondansetron  (ZOFRAN ) injection 4 mg (4 mg Intravenous Given 07/25/23 1131)  iohexol  (OMNIPAQUE ) 300 MG/ML solution 75 mL (75 mLs Intravenous Contrast Given 07/25/23 1207)  ondansetron  (ZOFRAN ) injection 4 mg (4 mg Intravenous Given 07/25/23 1457)    ED Course/ Medical Decision Making/ A&P                                 Medical Decision Making Amount and/or Complexity of Data Reviewed Labs: ordered. Radiology: ordered.  Risk Prescription drug management.   58 year old female presenting for abdominal pain, nausea, vomiting, diarrhea after clindamycin  use for tooth abscess.  Concerns for C. difficile.  On exam patient is alert oriented x 3, no acute distress, afebrile, stable vital signs.  Physical exam demonstrates soft abdomen with tenderness palpation in the right lower quadrant only.  No guarding or rigidity.  Medical interventions : patient given IV fluids, Zofran , and medication for pain control.  Laboratory studies: Demonstrate no leukocytosis.  Stable liver profile, lipase, and renal function.  Stable electrolytes despite diarrhea.  Patient unable to give stool sample for stool PCR to rule out C. difficile.    CT abdomen and pelvis with IV contrast demonstrates no acute process.  No diverticulitis.  No colitis.  Appendix  not visualized but no rounding inflammatory changes seen.   Symptoms likely secondary to gastroenteritis possibly from C. difficile. Outpatient lab ordered and patient given supplies to return home.  She also has an appointment with her GI specialist on Tuesday.  Zofran  sent to pharmacy for nausea.  Return precautions explained in detail in ED.  Patient in no distress and overall condition improved here in the ED. Detailed discussions were had with the patient regarding current findings, and need for close f/u with PCP or on call doctor. The patient has been instructed to return immediately if the symptoms worsen in any way for re-evaluation. Patient verbalized understanding and is in agreement with current care plan. All questions answered prior to discharge.  Final Clinical Impression(s) / ED Diagnoses Final diagnoses:  Gastroenteritis    Rx / DC Orders ED Discharge Orders          Ordered    ondansetron  (ZOFRAN ) 4 MG tablet  Every 6 hours PRN        07/25/23 1504    C Difficile Quick Screen w PCR reflex        07/25/23 1504    Ova and Parasite Examination        07/25/23 1504              Elnor Bernarda SQUIBB, DO 07/25/23 1505

## 2023-07-25 NOTE — ED Notes (Signed)
 Patient transported to CT

## 2023-07-25 NOTE — Discharge Instructions (Addendum)
 Please keep your appointment with your established gastroenterologist for this Tuesday for symptoms of nausea, vomiting, and diarrhea.  Outpatient lab for C. difficile, ova, and parasites ordered.  Please bring your stool sample to the the lab when ready.  Prescription for Zofran  for nausea sent to the pharmacy.

## 2023-07-25 NOTE — ED Triage Notes (Signed)
 The patient has had headache, abd pain and vomiting for days.

## 2023-07-29 ENCOUNTER — Telehealth: Payer: Self-pay

## 2023-07-29 ENCOUNTER — Ambulatory Visit: Payer: 59 | Admitting: Gastroenterology

## 2023-07-29 VITALS — BP 104/62 | HR 91 | Ht 68.0 in | Wt 130.2 lb

## 2023-07-29 DIAGNOSIS — G8929 Other chronic pain: Secondary | ICD-10-CM | POA: Diagnosis not present

## 2023-07-29 DIAGNOSIS — K5909 Other constipation: Secondary | ICD-10-CM

## 2023-07-29 DIAGNOSIS — R1031 Right lower quadrant pain: Secondary | ICD-10-CM | POA: Diagnosis not present

## 2023-07-29 DIAGNOSIS — K594 Anal spasm: Secondary | ICD-10-CM

## 2023-07-29 DIAGNOSIS — R197 Diarrhea, unspecified: Secondary | ICD-10-CM | POA: Diagnosis not present

## 2023-07-29 MED ORDER — IBGARD 90 MG PO CPCR: ORAL_CAPSULE | ORAL | Status: DC

## 2023-07-29 MED ORDER — AMBULATORY NON FORMULARY MEDICATION: 0 refills | Status: DC

## 2023-07-29 MED ORDER — HYOSCYAMINE SULFATE 0.125 MG SL SUBL: 0.1250 mg | SUBLINGUAL_TABLET | Freq: Four times a day (QID) | SUBLINGUAL | 1 refills | Status: DC | PRN

## 2023-07-29 NOTE — Progress Notes (Signed)
 HPI :  58 year old female known to our practice, previously followed by Dr. Teressa, most recently seen by Delon Failing in September, here for follow-up visit to discuss recent changes in her bowels and abdominal pain.  She has had problems with chronic constipation.  Often has taken MiraLAX daily to help with her bowel movements and this generally seems to work for her if she takes it every single day.  She states she had a URI/sinus infection and was treated with clindamycin  around the new year.  Shortly after starting this she had nausea, vomiting, and significant diarrhea.  She denies any sick contacts during this time.  She actually went to the emergency room on January 3 for the symptoms and had a CT scan done showing no concerning pathology, was given some IV fluids.  She was told to give us  a stool sample for C. difficile but has not been able to submit that yet.  Due to her symptoms she took most of the clindamycin  but stopped about 2 days short of a complete course.  She states her diarrhea has somewhat gotten better but she had another loose stool and brought a sample of it today for analysis.  She has had some nausea at baseline, antibiotics made it worse, she has not been vomiting.  Another issue she has been struggling with has been persistent pain in her right lower quadrant/groin area that radiates into her lower back/flank.  She had an appendectomy February of last year.  She states she had been having some abdominal pain before that and has persisted afterwards in a similar fashion.  She states she has some level of abdominal pain in the right lower quadrant all the time, the severity can fluctuate.  When she is constipated she feels worse but having a bowel movement while can perhaps provide some relief does not relieve it completely.  Denies much of positional changes.  She has been on Levsin  in the past, cannot recall how much this really helped her.  CT scan has not shown any cause  for this.  Her colonoscopy was within the past year and okay.  She has had lab work done in the ED recently which was normal.  She had a HIDA scan in 2023 which was normal.  EGD in 2023 showed some reflux esophagitis.  She was placed on omeprazole  at that time.  The other issue that she would like addressed today has been rectal discomfort.  For the past few months she has been experiencing stabbing rectal pain that bothers her sporadically and without clear triggers.  This is not a daily occurrence or even weekly, can bother her sporadically.  Can wake her up at night with rectal discomfort and it is very uncomfortable.  She denies any blood in her stools or pain with passing stool.  Her rectal pain is not related to bowel habits.  Of note she takes Lexapro  at baseline.  We discussed options for treatment of her symptoms.   Prior workup:  06/04/2022 EGD with Dr. Eda with mild distal esophagitis, mild gastritis and otherwise normal.  Pathology confirmed reflux esophagitis and gastritis with no H. pylori.  She was continued on Omeprazole  40 twice daily.  laparoscopic appendectomy in 09/03/2021.    09/19/2021 HIDA scan showed patent cystic and common bile ducts and normal gallbladder ejection fraction.  08/12/2022 CTAP with contrast with no acute abnormality, scattered left-sided colonic diverticulosis without findings of acute diverticulitis and aortic atherosclerosis.  12/03/2022 colonoscopy for a  family history of colon cancer with one 4 mm polyp in the ascending colon, diverticulosis in the sigmoid colon, internal hemorrhoids and otherwise normal.  Pathology showed adenomatous polyp.  Repeat recommended in 5 years given patient's family history.  Barium swallow 04/14/23: IMPRESSION: Normal esophagram. No evidence of reflux, other disc motility or esophagitis. No fixed stricture. No hiatal hernia. Mild prominence of the cricopharyngeal impression, probably subclinical. 13 mm barium tablet  passed normally.   CT abdomen / pelvis 07/25/23: FINDINGS: Lower chest: The lung bases are clear. No pleural effusion. The heart is normal in size. No pericardial effusion.   Hepatobiliary: The liver is normal in size. Non-cirrhotic configuration. No suspicious mass. No intrahepatic or extrahepatic bile duct dilation. No calcified gallstones. Normal gallbladder wall thickness. No pericholecystic inflammatory changes.   Pancreas: Unremarkable. No pancreatic ductal dilatation or surrounding inflammatory changes.   Spleen: Within normal limits. No focal lesion.   Adrenals/Urinary Tract: Adrenal glands are unremarkable. No suspicious renal mass. No hydronephrosis. No renal or ureteric calculi. Unremarkable urinary bladder.   Stomach/Bowel: No disproportionate dilation of the small or large bowel loops. No evidence of abnormal bowel wall thickening or inflammatory changes. The appendix was not visualized; however there is no acute inflammatory process in the right lower quadrant. There are scattered diverticula mainly in the left hemi colon, without imaging signs of diverticulitis.   Vascular/Lymphatic: No ascites or pneumoperitoneum. No abdominal or pelvic lymphadenopathy, by size criteria. No aneurysmal dilation of the major abdominal arteries. There are mild peripheral atherosclerotic vascular calcifications of the aorta and its major branches.   Reproductive: The uterus is unremarkable. No large adnexal mass.   Other: The visualized soft tissues and abdominal wall are unremarkable.   Musculoskeletal: No suspicious osseous lesions. There are mild multilevel degenerative changes in the visualized spine.   IMPRESSION: 1. No acute inflammatory process identified within the abdomen or pelvis. 2. Multiple other nonacute observations, as described above.     Past Medical History:  Diagnosis Date   Allergy    anemia    Anemia    Anxiety    Aortic atherosclerosis (HCC)     Arthritis    B12 deficiency    Back pain    BRCA negative 11/2013   Chest pain    Constipation    Depression    Fatty liver    Gallbladder problem    GERD (gastroesophageal reflux disease)    H pylori ulcer    Heartburn    Joint pain    Migraines    Palpitation    Pre-diabetes    Seizures (HCC)    3 YEARS AGO UPDATED 11/07/22   sleep apnea    Stomach ulcer    Ulcer of gastric fundus    Vitamin D  deficiency      Past Surgical History:  Procedure Laterality Date   CESAREAN SECTION  8004,8001   COLONOSCOPY     ENDOMETRIAL CRYOABLATION  07/10/2011   HER OPTION - office performed   LAPAROSCOPIC APPENDECTOMY N/A 09/03/2021   Procedure: APPENDECTOMY LAPAROSCOPIC;  Surgeon: Rubin Calamity, MD;  Location: [facility];  Service: General;  Laterality: N/A;   TONSILLECTOMY  07/22/1985   Family History  Problem Relation Age of Onset   Colon polyps Mother    Colon cancer Mother    Diabetes Mother    Hypertension Mother    Cancer Mother 48       intestine and ovarian   Diverticulitis Mother    Depression Mother  Anxiety disorder Mother    Sleep apnea Mother    Stroke Father    Diabetes Father    Hypertension Father    Heart disease Father    Heart attack Father        x 2   Colon cancer Sister    Colon polyps Sister    Rectal cancer Sister    Cancer Sister 67       ovarian and intestine   Cancer Brother 24       GI cancer   Breast cancer Maternal Aunt 60   Cancer Paternal Aunt 71       stomach   Cancer Maternal Grandmother 36       ovarian and pancreatic   Cancer Cousin 39       ovarian   Cancer Cousin 47       breast   Crohn's disease Neg Hx    Esophageal cancer Neg Hx    Stomach cancer Neg Hx    Ulcerative colitis Neg Hx    Social History   Tobacco Use   Smoking status: Some Days    Types: Cigarettes   Smokeless tobacco: Never  Vaping Use   Vaping status: Never Used  Substance Use Topics   Alcohol use: Yes    Comment: occ   Drug use: No    Current Outpatient Medications  Medication Sig Dispense Refill   escitalopram  (LEXAPRO ) 20 MG tablet Take 1 tablet (20 mg total) by mouth daily. 90 tablet 0   naproxen  (NAPROSYN ) 500 MG tablet Take 1 tablet (500 mg total) by mouth 2 (two) times daily with a meal. (Patient taking differently: Take 500 mg by mouth daily.) 14 tablet 0   omeprazole  (PRILOSEC) 40 MG capsule Take 40 mg by mouth daily.     ondansetron  (ZOFRAN ) 4 MG tablet Take 1 tablet (4 mg total) by mouth every 6 (six) hours as needed for nausea or vomiting. 12 tablet 0   ZOLMitriptan  (ZOMIG ) 2.5 MG tablet Take 1 tablet (2.5 mg total) by mouth once for 1 dose. May repeat in 2 hours if headache persists or recurs. 10 tablet 1   benzonatate  (TESSALON ) 200 MG capsule Take 1 capsule (200 mg total) by mouth 2 (two) times daily as needed for cough. (Patient not taking: Reported on 07/29/2023) 20 capsule 0   Cholecalciferol (VITAMIN D -3 PO) Take 50,000 Units by mouth once a week. (Patient not taking: Reported on 07/29/2023)     cyclobenzaprine  (FLEXERIL ) 5 MG tablet Take 1 tablet (5 mg total) by mouth 3 (three) times daily as needed for muscle spasms. (Patient not taking: Reported on 07/29/2023) 30 tablet 0   dexlansoprazole  (DEXILANT ) 60 MG capsule Take 1 capsule (60 mg total) by mouth daily. (Patient not taking: Reported on 07/29/2023) 30 capsule 11   diphenhydrAMINE  HCl (BENADRYL  ALLERGY PO) Take by mouth as needed. (Patient not taking: Reported on 07/29/2023)     fluticasone  (FLONASE ) 50 MCG/ACT nasal spray Place 2 sprays into both nostrils daily. (Patient not taking: Reported on 07/29/2023) 16 g 6   hyoscyamine  (LEVSIN  SL) 0.125 MG SL tablet Place 1 tablet (0.125 mg total) under the tongue every 6 (six) hours as needed for cramping. (Patient not taking: Reported on 07/29/2023) 30 tablet 6   No current facility-administered medications for this visit.   Allergies  Allergen Reactions   Aspirin Swelling and Other (See Comments)    Bleeding    Augmentin [Amoxicillin -Pot Clavulanate] Swelling   Oxycodone  Nausea And Vomiting  Review of Systems: All systems reviewed and negative except where noted in HPI.    CT ABDOMEN PELVIS W CONTRAST Result Date: 07/25/2023 CLINICAL DATA:  RLQ abdominal pain nausea, diarrhea, rlq abdominal pain EXAM: CT ABDOMEN AND PELVIS WITH CONTRAST TECHNIQUE: Multidetector CT imaging of the abdomen and pelvis was performed using the standard protocol following bolus administration of intravenous contrast. RADIATION DOSE REDUCTION: This exam was performed according to the departmental dose-optimization program which includes automated exposure control, adjustment of the mA and/or kV according to patient size and/or use of iterative reconstruction technique. CONTRAST:  75mL OMNIPAQUE  IOHEXOL  300 MG/ML  SOLN COMPARISON:  CT scan abdomen and pelvis from 08/12/2022. FINDINGS: Lower chest: The lung bases are clear. No pleural effusion. The heart is normal in size. No pericardial effusion. Hepatobiliary: The liver is normal in size. Non-cirrhotic configuration. No suspicious mass. No intrahepatic or extrahepatic bile duct dilation. No calcified gallstones. Normal gallbladder wall thickness. No pericholecystic inflammatory changes. Pancreas: Unremarkable. No pancreatic ductal dilatation or surrounding inflammatory changes. Spleen: Within normal limits. No focal lesion. Adrenals/Urinary Tract: Adrenal glands are unremarkable. No suspicious renal mass. No hydronephrosis. No renal or ureteric calculi. Unremarkable urinary bladder. Stomach/Bowel: No disproportionate dilation of the small or large bowel loops. No evidence of abnormal bowel wall thickening or inflammatory changes. The appendix was not visualized; however there is no acute inflammatory process in the right lower quadrant. There are scattered diverticula mainly in the left hemi colon, without imaging signs of diverticulitis. Vascular/Lymphatic: No ascites or  pneumoperitoneum. No abdominal or pelvic lymphadenopathy, by size criteria. No aneurysmal dilation of the major abdominal arteries. There are mild peripheral atherosclerotic vascular calcifications of the aorta and its major branches. Reproductive: The uterus is unremarkable. No large adnexal mass. Other: The visualized soft tissues and abdominal wall are unremarkable. Musculoskeletal: No suspicious osseous lesions. There are mild multilevel degenerative changes in the visualized spine. IMPRESSION: 1. No acute inflammatory process identified within the abdomen or pelvis. 2. Multiple other nonacute observations, as described above. Electronically Signed   By: Ree Molt M.D.   On: 07/25/2023 14:01    Lab Results  Component Value Date   WBC 6.8 07/25/2023   HGB 13.1 07/25/2023   HCT 38.8 07/25/2023   MCV 88.2 07/25/2023   PLT 367 07/25/2023    Lab Results  Component Value Date   NA 137 07/25/2023   CL 104 07/25/2023   K 4.5 07/25/2023   CO2 24 07/25/2023   BUN 28 (H) 07/25/2023   CREATININE 0.88 07/25/2023   GFRNONAA >60 07/25/2023   CALCIUM  10.1 07/25/2023   PHOS 4.5 (H) 04/10/2023   ALBUMIN 4.5 07/25/2023   GLUCOSE 92 07/25/2023    Lab Results  Component Value Date   ALT 20 07/25/2023   AST 18 07/25/2023   ALKPHOS 53 07/25/2023   BILITOT 0.8 07/25/2023    Physical Exam: BP 104/62   Pulse 91   Ht 5' 8 (1.727 m)   Wt 130 lb 4 oz (59.1 kg)   LMP 03/14/2018   SpO2 99%   BMI 19.80 kg/m  Constitutional: Pleasant,well-developed, female in no acute distress. Abdominal: Soft, tender to RLQ, negative [place]. There are no masses palpable. No hepatomegaly. DRE / Anoscopy - CMA Madison Favre as standby - no fissure, no mass lesions, small hemorrhoids Extremities: no edema Neurological: Alert and oriented to person place and time. Psychiatric: Normal mood and affect. Behavior is normal.   ASSESSMENT: 58 y.o. female here for assessment of the following  1. Diarrhea,  unspecified type   2. Chronic RLQ pain   3. Rectal spasm   4. Chronic constipation    As above, chronic constipation with new onset diarrhea and abdominal discomfort/nausea in the setting of clindamycin  use.  She has recently stopped clindamycin .  Her bowels appear to be somewhat better since stopping it however still having some loose stool which is unusual for her.  She brought a stool sample today, will check for C. difficile although I wonder if she just had antibiotic related diarrhea/sensitivity to the antibiotic.  She will hold further clindamycin  for now, await stool test.  She seems to be getting better, can resume MiraLAX for her chronic constipation at baseline when she needs it.  We spent majority of visit discussing her right lower quadrant pain and rectal spasm today.  She has had persistent right lower quadrant/inguinal pain that goes to her right flank and back.  CT scan imaging and colonoscopy up-to-date and negative.  She has pain there all the time, I suspect this may be neuropathic or musculoskeletal at this point time although could not reproduce it on exam.  We discussed other options.  I think she may benefit from a switch from Lexapro  to Cymbalta  for chronic pain.  I will message her prescribing provider to see if they are okay with that, patient understands and would agree with this switch if possible.  I will give her some IBgard in the interim to see if that will make any difference to this otherwise although I do not think bowel spasm is driving this process.  As for her rectal pain, DRE and anoscopy are unrevealing for fissure or concerning pathology.  Her symptoms are very consistent with rectal spasm.  She has Levsin  at home and we will refill that to use as needed to see if this will help break spasm, she can also take for her other abdominal pain.  I will also give her some diltiazem ointment [facility] to use as needed for spasm as well.  Perhaps Cymbalta  may  also help with this issue.  Will await her course.  PLAN: - stool for C diff - diatherix PCR - finished clindamycin , will monitor for now - resume Miralax when diarrhea resolved, working well for chronic constipation I do not think need to escalate chronically for constipation - chronic RLQ pain - sounds neuropathic / musculoskeletal at this point, CT negative, colonoscopy UTD. May benefit from swiching lexapro  to cymbalta  will message her provider - Caren Beasley - refill Levsin  to use PRN - add IB gard samples to use PRN - diltiazem ointment to use PRN for spasm if symptoms persist  Anne Naval, MD [facility]

## 2023-07-29 NOTE — Transitions of Care (Post Inpatient/ED Visit) (Signed)
 07/29/2023  Name: Anne Lewis MRN: [id number] DOB: 10-28-1965  Today's TOC FU Call Status: Today's TOC FU Call Status:: Successful TOC FU Call Completed TOC FU Call Complete Date: 07/29/23 Patient's Name and Date of Birth confirmed.  Transition Care Management Follow-up Telephone Call Date of Discharge: 07/25/23 Discharge Facility: [facility] Type of Discharge: Emergency Department How have you been since you were released from the hospital?: Same Any questions or concerns?: No  Items Reviewed: Did you receive and understand the discharge instructions provided?: Yes Any new allergies since your discharge?: No Dietary orders reviewed?: Yes Do you have support at home?: Yes  Medications Reviewed Today: Medications Reviewed Today     Reviewed by Alessandra Dedra PARAS, RN (Registered Nurse) on 07/29/23 at 1346  Med List Status: <None>   Medication Order Taking? Sig Documenting Provider Last Dose Status Informant  AMBULATORY NON FORMULARY MEDICATION [id number] Yes Diltiazem gel with 2% lidocaine   Apply a pea sized amount into your rectum as needed Dispense 30 GM zero refill Leigh Elspeth SQUIBB, MD 07/29/2023 Active   benzonatate  (TESSALON ) 200 MG capsule [id number] No Take 1 capsule (200 mg total) by mouth 2 (two) times daily as needed for cough.  Patient not taking: Reported on 07/29/2023   Blair, Diane W, FNP Not Taking Active   Cholecalciferol (VITAMIN D -3 PO) [id number]  Take 50,000 Units by mouth once a week. [provider]  Active   cyclobenzaprine  (FLEXERIL ) 5 MG tablet [id number] No Take 1 tablet (5 mg total) by mouth 3 (three) times daily as needed for muscle spasms.  Patient not taking: Reported on 07/29/2023   Prentiss Annabella LABOR, NP Not Taking Active   dexlansoprazole  (DEXILANT ) 60 MG capsule [id number] No Take 1 capsule (60 mg total) by mouth daily.  Patient not taking: Reported on 06/05/2023   Beather Delon Gibson, [place] Not Taking Active    diphenhydrAMINE  HCl (BENADRYL  ALLERGY PO) [id number] No Take by mouth as needed.  Patient not taking: Reported on 07/29/2023   [provider] Not Taking Active   escitalopram  (LEXAPRO ) 20 MG tablet [id number] Yes Take 1 tablet (20 mg total) by mouth daily. Verdon Parry D, MD Taking Active   fluticasone  (FLONASE ) 50 MCG/ACT nasal spray [id number] No Place 2 sprays into both nostrils daily.  Patient not taking: Reported on 07/29/2023   Blair, Diane W, FNP Not Taking Active   hyoscyamine  (LEVSIN  SL) 0.125 MG SL tablet [id number] Yes Place 1 tablet (0.125 mg total) under the tongue every 6 (six) hours as needed for cramping. Leigh Elspeth SQUIBB, MD Taking Active   naproxen  (NAPROSYN ) 500 MG tablet [id number] Yes Take 1 tablet (500 mg total) by mouth 2 (two) times daily with a meal.  Patient taking differently: Take 500 mg by mouth daily.   Thedora Garnette [name], MD Taking Active   omeprazole  [facility]) 40 MG capsule [id number] Yes Take 40 mg by mouth daily. [provider] Taking Active   ondansetron  (ZOFRAN ) 4 MG tablet [id number] Yes Take 1 tablet (4 mg total) by mouth every 6 (six) hours as needed for nausea or vomiting. Elnor Bernarda SQUIBB, DO Taking Active   Peppermint Oil (IBGARD) 90 MG CPCR [id number] Yes Take as directed Armbruster, Elspeth SQUIBB, MD Taking Active   ZOLMitriptan  (ZOMIG ) 2.5 MG tablet [id number] Yes Take 1 tablet (2.5 mg total) by mouth once for 1 dose. May repeat in 2 hours if headache persists or recurs. Berneta Elsie Sayre, MD Taking Active  Med Note ALM NIELS CROME   Wed Dec 19, 2021  9:13 AM)              Home Care and Equipment/Supplies: Were Home Health Services Ordered?: NA Any new equipment or medical supplies ordered?: NA  Functional Questionnaire: Do you need assistance with bathing/showering or dressing?: No Do you need assistance with meal preparation?: No Do you need assistance with eating?: No Do you have difficulty maintaining continence:  No Do you need assistance with getting out of bed/getting out of a chair/moving?: No Do you have difficulty managing or taking your medications?: No  Follow up appointments reviewed:      SIGNATURE Dedra Sorenson, BSN, RN

## 2023-07-29 NOTE — Patient Instructions (Addendum)
 Your provider has ordered Diatherix stool testing for you. You have received a kit from our office today containing all necessary supplies to complete this test. Please carefully read the stool collection instructions provided in the kit before opening the accompanying materials. In addition, be sure to place the label from the top right corner of the laboratory request sheet onto the puritan opti-swab tube that is supplied in the kit. This label should include your full name and date of birth. After completing the test, you should secure the purtian tube into the specimen biohazard bag. The laboratory request information sheet (including date and time of specimen collection) should be placed into the outside pocket of the specimen biohazard bag and returned to the [facility] lab with 2 days of collection.   If the laboratory information sheet specimen date and time are not filled out, the test will NOT be performed.   We have given you samples of the following medication to take: IBgard - use as directed as needed  Resume Miralax as needed after diarrhea resolves.  We have sent the following medications to your pharmacy for you to pick up at your convenience:  Levsin  - use as needed ___________________________________________________  We have sent a prescription for Diltiazem 2% gel with Lidocaine  to [facility]. You should apply a pea size amount to your rectum three times daily for 4 to 6 weeks.  [facility]'s information is below: Address: [address] [place], [place] [address]  [phone]  *Please DO NOT go directly from our office to pick up this medication! Give the pharmacy 1 day to process the prescription as this is compounded and takes time to make.  _____________________________________________________  Thank you for entrusting me with your care and for choosing [facility], Dr. Elspeth Naval    If your blood pressure at your visit was  140/90 or greater, please contact your primary care physician to follow up on this. ______________________________________________________  If you are age 20 or older, your body mass index should be between 23-30. Your Body mass index is 19.8 kg/m. If this is out of the aforementioned range listed, please consider follow up with your Primary Care Provider.  If you are age 66 or younger, your body mass index should be between 19-25. Your Body mass index is 19.8 kg/m. If this is out of the aformentioned range listed, please consider follow up with your Primary Care Provider.  ________________________________________________________  The  GI providers would like to encourage you to use MYCHART to communicate with providers for non-urgent requests or questions.  Due to long hold times on the telephone, sending your provider a message by [facility] may be a faster and more efficient way to get a response.  Please allow 48 business hours for a response.  Please remember that this is for non-urgent requests.  _______________________________________________________  Due to recent changes in healthcare laws, you may see the results of your imaging and laboratory studies on MyChart before your provider has had a chance to review them.  We understand that in some cases there may be results that are confusing or concerning to you. Not all laboratory results come back in the same time frame and the provider may be waiting for multiple results in order to interpret others.  Please give us  48 hours in order for your provider to thoroughly review all the results before contacting the office for clarification of your results.

## 2023-07-31 ENCOUNTER — Encounter: Payer: Self-pay | Admitting: Family Medicine

## 2023-07-31 ENCOUNTER — Telehealth: Payer: Self-pay

## 2023-07-31 ENCOUNTER — Ambulatory Visit (INDEPENDENT_AMBULATORY_CARE_PROVIDER_SITE_OTHER): Payer: 59 | Admitting: Family Medicine

## 2023-07-31 VITALS — BP 112/62 | HR 81 | Temp 98.8°F | Resp 16 | Ht 68.0 in | Wt 131.4 lb

## 2023-07-31 DIAGNOSIS — J069 Acute upper respiratory infection, unspecified: Secondary | ICD-10-CM

## 2023-07-31 MED ORDER — DULOXETINE HCL 30 MG PO CPEP: 30.0000 mg | ORAL_CAPSULE | Freq: Every day | ORAL | 0 refills | Status: DC

## 2023-07-31 MED ORDER — PREDNISONE 20 MG PO TABS: 20.0000 mg | ORAL_TABLET | Freq: Two times a day (BID) | ORAL | 0 refills | Status: AC

## 2023-07-31 NOTE — Telephone Encounter (Signed)
 Called and left message for patient with results and recommendations.

## 2023-07-31 NOTE — Telephone Encounter (Signed)
 C diff negative - hopefully her symptoms have improved. Jan can you let her know that she is negative for C Diff. Can take immodium PRN if loose stools persist. Thanks

## 2023-07-31 NOTE — Progress Notes (Signed)
 Established Patient Office Visit   Subjective:  Patient ID: Anne Lewis, female    DOB: Mar 17, 1966  Age: 58 y.o. MRN: [id number]  Chief Complaint  Patient presents with   Hospitalization Follow-up   Headache    Patient reports still having headache and head pressure     Headache  Pertinent negatives include no abdominal pain, blurred vision, eye redness, tingling or weakness.   Encounter Diagnoses  Name Primary?   Upper respiratory tract infection, unspecified type Yes   Ongoing pressure pain in her face and head since 24 December.  She was seen in the emergency room and started on antibiotics for abscess and sinusitis.  She was unable to complete the course of therapy secondary to abdominal pain and diarrhea.  Testing for C. difficile is pending.  She has no rhinorrhea or postnasal drip.  Pressure does increase when she bends over.  She recently stopped Flonase  there was no rhinorrhea or postnasal drip.  No fevers or chills.   Review of Systems  Constitutional: Negative.   HENT: Negative.    Eyes:  Negative for blurred vision, discharge and redness.  Respiratory: Negative.    Cardiovascular: Negative.   Gastrointestinal:  Negative for abdominal pain.  Genitourinary: Negative.   Musculoskeletal: Negative.  Negative for myalgias.  Skin:  Negative for rash.  Neurological:  Positive for headaches. Negative for tingling, loss of consciousness and weakness.  Endo/Heme/Allergies:  Negative for polydipsia.     Current Outpatient Medications:    AMBULATORY NON FORMULARY MEDICATION, Diltiazem gel with 2% lidocaine   Apply a pea sized amount into your rectum as needed Dispense 30 GM zero refill, Disp: 30 g, Rfl: 0   benzonatate  (TESSALON ) 200 MG capsule, Take 1 capsule (200 mg total) by mouth 2 (two) times daily as needed for cough., Disp: 20 capsule, Rfl: 0   Cholecalciferol (VITAMIN D -3 PO), Take 50,000 Units by mouth once a week., Disp: , Rfl:    cyclobenzaprine  (FLEXERIL )  5 MG tablet, Take 1 tablet (5 mg total) by mouth 3 (three) times daily as needed for muscle spasms., Disp: 30 tablet, Rfl: 0   dexlansoprazole  (DEXILANT ) 60 MG capsule, Take 1 capsule (60 mg total) by mouth daily., Disp: 30 capsule, Rfl: 11   diphenhydrAMINE  HCl (BENADRYL  ALLERGY PO), Take by mouth as needed., Disp: , Rfl:    DULoxetine  (CYMBALTA ) 30 MG capsule, Take 1 capsule (30 mg total) by mouth daily., Disp: 30 capsule, Rfl: 0   fluticasone  (FLONASE ) 50 MCG/ACT nasal spray, Place 2 sprays into both nostrils daily., Disp: 16 g, Rfl: 6   hyoscyamine  (LEVSIN  SL) 0.125 MG SL tablet, Place 1 tablet (0.125 mg total) under the tongue every 6 (six) hours as needed for cramping., Disp: 60 tablet, Rfl: 1   naproxen  (NAPROSYN ) 500 MG tablet, Take 1 tablet (500 mg total) by mouth 2 (two) times daily with a meal. (Patient taking differently: Take 500 mg by mouth daily.), Disp: 14 tablet, Rfl: 0   omeprazole  (PRILOSEC) 40 MG capsule, Take 40 mg by mouth daily., Disp: , Rfl:    ondansetron  (ZOFRAN ) 4 MG tablet, Take 1 tablet (4 mg total) by mouth every 6 (six) hours as needed for nausea or vomiting., Disp: 12 tablet, Rfl: 0   Peppermint Oil (IBGARD) 90 MG CPCR, Take as directed, Disp: , Rfl:    predniSONE  (DELTASONE ) 20 MG tablet, Take 1 tablet (20 mg total) by mouth 2 (two) times daily with a meal for 7 days., Disp: 14 tablet, Rfl:  0   ZOLMitriptan  (ZOMIG ) 2.5 MG tablet, Take 1 tablet (2.5 mg total) by mouth once for 1 dose. May repeat in 2 hours if headache persists or recurs., Disp: 10 tablet, Rfl: 1   Objective:     BP 112/62 (BP Location: Right Arm, Patient Position: Sitting, Cuff Size: Small)   Pulse 81   Temp 98.8 F (37.1 C) (Temporal)   Resp 16   Ht 5' 8 (1.727 m)   Wt 131 lb 6.4 oz (59.6 kg)   LMP 03/14/2018   SpO2 99%   BMI 19.98 kg/m    Physical Exam Constitutional:      General: She is not in acute distress.    Appearance: Normal appearance. She is not ill-appearing,  toxic-appearing or diaphoretic.  HENT:     Head: Normocephalic and atraumatic.     Comments: Tenderness to palpation of the maxillary sinuses.    Right Ear: External ear normal.     Left Ear: External ear normal.     Mouth/Throat:     Mouth: Mucous membranes are moist.     Pharynx: Oropharynx is clear. No oropharyngeal exudate or posterior oropharyngeal erythema.  Eyes:     General: No scleral icterus.       Right eye: No discharge.        Left eye: No discharge.     Extraocular Movements: Extraocular movements intact.     Conjunctiva/sclera: Conjunctivae normal.     Pupils: Pupils are equal, round, and reactive to light.  Cardiovascular:     Rate and Rhythm: Normal rate and regular rhythm.  Pulmonary:     Effort: Pulmonary effort is normal. No respiratory distress.     Breath sounds: Normal breath sounds.  Abdominal:     General: Bowel sounds are normal.  Musculoskeletal:     Cervical back: No rigidity or tenderness.  Skin:    General: Skin is warm and dry.  Neurological:     Mental Status: She is alert and oriented to person, place, and time.  Psychiatric:        Mood and Affect: Mood normal.        Behavior: Behavior normal.      No results found for any visits on 07/31/23.    The 10-year ASCVD risk score (Arnett DK, et al., 2019) is: 5.7%    Assessment & Plan:   Upper respiratory tract infection, unspecified type -     DG Sinuses Complete; Future -     predniSONE ; Take 1 tablet (20 mg total) by mouth 2 (two) times daily with a meal for 7 days.  Dispense: 14 tablet; Refill: 0    Return in about 1 week (around 08/07/2023), or if symptoms worsen or fail to improve.  I have ordered sinus films and prednisone  20 twice daily for 7 days.  M has a dental border antibiotic at this point when testing for C. difficile is pending.  Elsie Sim Lent, MD

## 2023-07-31 NOTE — Telephone Encounter (Signed)
 Called and LM for patient with recommendations. Cymbalta script sent to pharmacy.  Med list updated.  Patient instructed to call or MyChart in a weeks

## 2023-07-31 NOTE — Telephone Encounter (Signed)
 error

## 2023-07-31 NOTE — Telephone Encounter (Signed)
 Results of C Diff [facility]:  Collected 07-29-23 "Not Detected" . Result placed on Dr. Lanetta Inch desk

## 2023-07-31 NOTE — Telephone Encounter (Signed)
-----   Message from Elspeth SHAUNNA Naval sent at 07/31/2023 10:46 AM EST ----- Regarding: FW: mutual patient Jan can you please relay the following to the patient and place this message in a telephone note:  I have been in touch with Dr. Verdon, she is okay if we switch the Lexapro  to Cymbalta .  Can you please contact the patient to let her know and she can stop the Lexapro  and start Cymbalta  30 mg daily.  Would recommend she take that dose for a few weeks and see how she does.  We can increase the dose to 60 mg daily if needed, depending on her response.  Thank you  Dr. Naval ----- Message ----- From: Verdon Louann BIRCH, MD Sent: 07/30/2023   4:50 PM EST To: Elspeth SHAUNNA Naval, MD Subject: RE: mutual patient                             Thank you for seeing this patient. I completely agree with changing to cymbalta .  Louann Verdon ----- Message ----- From: Naval Elspeth SHAUNNA, MD Sent: 07/29/2023  11:24 AM EST To: Louann BIRCH Verdon, MD Subject: mutual patient                                 Hello, wanted to touch base about this mutual patient.  She has a lot of chronic pain in her abdomen I think is neuropathic or musculoskeletal.  I wonder if she may benefit from a switch from her Lexapro  to Cymbalta  to help more with her chronic pain.  Would you be okay with this switch?  If so I can let her know and prescribe it for her.  Thanks very much for your input.  Marcey Naval

## 2023-08-07 ENCOUNTER — Ambulatory Visit (INDEPENDENT_AMBULATORY_CARE_PROVIDER_SITE_OTHER): Payer: 59 | Admitting: Family Medicine

## 2023-08-07 ENCOUNTER — Other Ambulatory Visit (HOSPITAL_COMMUNITY): Payer: Self-pay

## 2023-08-13 ENCOUNTER — Encounter (INDEPENDENT_AMBULATORY_CARE_PROVIDER_SITE_OTHER): Payer: Self-pay

## 2023-08-13 ENCOUNTER — Ambulatory Visit (INDEPENDENT_AMBULATORY_CARE_PROVIDER_SITE_OTHER): Payer: 59 | Admitting: Family Medicine

## 2023-08-28 ENCOUNTER — Ambulatory Visit
Admission: EM | Admit: 2023-08-28 | Discharge: 2023-08-28 | Disposition: A | Discharge status: Home / Self Care | Payer: 59 | Attending: Internal Medicine | Admitting: Internal Medicine

## 2023-08-28 ENCOUNTER — Ambulatory Visit: Payer: Self-pay | Admitting: Family Medicine

## 2023-08-28 DIAGNOSIS — B9689 Other specified bacterial agents as the cause of diseases classified elsewhere: Secondary | ICD-10-CM

## 2023-08-28 DIAGNOSIS — J329 Chronic sinusitis, unspecified: Secondary | ICD-10-CM

## 2023-08-28 DIAGNOSIS — H6993 Unspecified Eustachian tube disorder, bilateral: Secondary | ICD-10-CM

## 2023-08-28 DIAGNOSIS — R051 Acute cough: Secondary | ICD-10-CM

## 2023-08-28 MED ORDER — PREDNISONE 20 MG PO TABS: 40.0000 mg | ORAL_TABLET | Freq: Every day | ORAL | 0 refills | Status: AC

## 2023-08-28 MED ORDER — DOXYCYCLINE HYCLATE 100 MG PO CAPS: 100.0000 mg | ORAL_CAPSULE | Freq: Two times a day (BID) | ORAL | 0 refills | Status: AC

## 2023-08-28 NOTE — ED Triage Notes (Signed)
 Productive cough with green/brown/bloody mucus tinged with blood as well, chest congestion, intermittent wheezing, hoarse voice, sore throat, chills, discomfort/pressure in ears, and headache. Onset 1 week ago.  Patient states she did travel to her fathers funeral. One sister had laryngitis and her other sister was sick as well but unknown diagnosis.   Patient has tried Flonase  nasal spray, Saline nasal spray, and Mucinex  with no relief.

## 2023-08-28 NOTE — Telephone Encounter (Signed)
  Chief Complaint: cough, congestion Symptoms: productive cough with green/brown/bloody mucus, chest congestion, intermittent wheezing, hoarse voice, sore throat, chills, discomfort/pressure in ears Frequency: gradual x 1 week Pertinent Negatives: Patient denies fever Disposition: [] ED /[x] Urgent Care (no appt availability in office) / [] Appointment(In office/virtual)/ []  [facility]/ [] Home Care/ [] Refused Recommended Disposition /[] [facility]/ []  Follow-up with PCP Additional Notes: Patient states the congestion began a week ago and each day felt like a new symptom came on. Cough x 2 days. Patient states she went to her father's funeral last week and her sisters had laryngitis and something else, she is unsure if she caught something from them. Patient states she is at work and can not make the acute office visit appointments offered. Patient agreeable to go to  urgent care after work.  Reason for CRM: Patient stating that she is having flu-like symptoms with nose congestion, hoarse voice, and a sore throat. Congestion is leading to chest pain so I did  a warm-transfer to nurse triage.  Reason for Disposition  Wheezing is present  Answer Assessment - Initial Assessment Questions 1. ONSET: When did the cough begin?      X 2 days.  2. SEVERITY: How bad is the cough today?      Comes and goes  3. SPUTUM: Describe the color of your sputum (none, dry cough; clear, white, yellow, green)     Green and brown, thick.  4. HEMOPTYSIS: Are you coughing up any blood? If so ask: How much? (flecks, streaks, tablespoons, etc.)     Sometimes blood, states like a thin line or small blood.  5. DIFFICULTY BREATHING: Are you having difficulty breathing? If Yes, ask: How bad is it? (e.g., mild, moderate, severe)    - MILD: No SOB at rest, mild SOB with walking, speaks normally in sentences, can lie down, no retractions, pulse < 100.    - MODERATE: SOB at rest, SOB with  minimal exertion and prefers to sit, cannot lie down flat, speaks in phrases, mild retractions, audible wheezing, pulse 100-120.    - SEVERE: Very SOB at rest, speaks in single words, struggling to breathe, sitting hunched forward, retractions, pulse > 120      Denies. States she just coughs and has lots of congestion.  6. FEVER: Do you have a fever? If Yes, ask: What is your temperature, how was it measured, and when did it start?     Unsure, chills and feels warm but has not taken temperature.  7. CARDIAC HISTORY: Do you have any history of heart disease? (e.g., heart attack, congestive heart failure)      Denies.  8. LUNG HISTORY: Do you have any history of lung disease?  (e.g., pulmonary embolus, asthma, emphysema)     Denies.  9. PE RISK FACTORS: Do you have a history of blood clots? (or: recent major surgery, recent prolonged travel, bedridden)     Denies.  10. OTHER SYMPTOMS: Do you have any other symptoms? (e.g., runny nose, wheezing, chest pain)       Chest congestion, nasal congestion, discomfort/pressure in ears, hoarse voice, intermittent wheezing, sore throat  11. TRAVEL: Have you traveled out of the country in the last month? (e.g., travel history, exposures)       Denies travel out of country, but did travel on airplane out of state in the last week.  Protocols used: Cough - Acute Productive-A-AH

## 2023-08-28 NOTE — Discharge Instructions (Signed)
 Your evaluation shows you have a bacterial sinus infection. - Take antibiotic sent to pharmacy as directed to treat sinus infection. - Prednisone  once daily for 5 days.  - Purchase Mucinex  over the counter and take this every 12 hours as needed for nasal congestion and cough. - Warm compresses to the cheeks and forehead as needed to help with sinus headaches as well as tylenol  as needed.  If you develop any new or worsening symptoms or if your symptoms do not start to improve, please return here or follow-up with your primary care provider. If your symptoms are severe, please go to the emergency room.

## 2023-08-28 NOTE — ED Provider Notes (Signed)
 [facility]    CSN: [id number] Arrival date & time: 08/28/23  1636      History   Chief Complaint Chief Complaint  Patient presents with   Cough   Headache    HPI Anne Lewis is a 58 y.o. female.   Anne Lewis is a 58 y.o. female presenting for chief complaint of cough, frontal sinus headache, voice hoarseness, and nasal congestion that started 7-8 days ago. Cough is minimally productive, denies shortness of breath/chest tightness with coughing. Voice became hoarse a few days ago though she does not complain of much throat pain, states it feels scratchy and irritated. She additionally reports bilateral ear pressure. She was recently exposed to family members with similar symptoms and also [place] airport where she may have been further exposed to sick individuals. Denies recent antibiotic/steroid use, nausea, vomiting, diarrhea, abdominal pain, dizziness, leg swelling, orthopnea, and history of chronic respiratory problems. Taking OTC medications without any relief.    Cough Associated symptoms: headaches   Headache Associated symptoms: cough     Past Medical History:  Diagnosis Date   Allergy    anemia    Anemia    Anxiety    Aortic atherosclerosis (HCC)    Arthritis    B12 deficiency    Back pain    BRCA negative 11/2013   Chest pain    Constipation    Depression    Fatty liver    Gallbladder problem    GERD (gastroesophageal reflux disease)    H pylori ulcer    Heartburn    Joint pain    Migraines    Palpitation    Pre-diabetes    Seizures (HCC)    3 YEARS AGO UPDATED 11/07/22   sleep apnea    Stomach ulcer    Ulcer of gastric fundus    Vitamin D  deficiency     Patient Active Problem List   Diagnosis Date Noted   Right lower quadrant pain 04/10/2023   Protein-calorie malnutrition (HCC) 04/10/2023   Pharyngeal dysphagia 04/10/2023   GI problem 02/12/2023   BMI 20.0-20.9, adult 10/22/2022    Gastroesophageal reflux disease with esophagitis without hemorrhage 08/08/2022   Elevated blood pressure reading without diagnosis of hypertension 08/08/2022   Nausea and vomiting in adult 04/30/2022   Depression 04/30/2022   Generalized obesity 04/30/2022   Depression with anxiety 03/19/2022   Facial cellulitis 01/24/2022   Ecchymosis 12/11/2021   Rash 12/11/2021   Pre-diabetes 12/11/2021   Acute appendicitis 09/03/2021   Perimenopausal 06/18/2021   Vitamin D  deficiency 06/18/2021   Intractable headache 06/18/2021   Sciatica of right side 01/11/2021   Paresthesias 08/30/2019   Weight gain 08/30/2019   Dysthymia 08/30/2019   Reactive airway disease 10/06/2018   Osteoarthritis of spine with radiculopathy, cervical region 04/16/2018   Urticaria 04/16/2018   Tooth pain 04/16/2018   Acute non-recurrent sinusitis 08/19/2017   Lateral epicondylitis of left elbow 08/19/2017   Cough 08/19/2017   Healthcare maintenance 08/19/2017   Family history of malignant neoplasm of breast 11/08/2013   Family history of malignant neoplasm of gastrointestinal tract 11/08/2013   Family history of malignant neoplasm of ovary 11/08/2013   RUQ PAIN 08/08/2010   DYSPEPSIA&OTHER SPEC DISORDERS FUNCTION STOMACH 08/07/2010   ABDOMINAL PAIN, EPIGASTRIC 08/07/2010   OTHER CONSTIPATION 12/05/2009    Past Surgical History:  Procedure Laterality Date   CESAREAN SECTION  8004,8001   COLONOSCOPY     ENDOMETRIAL CRYOABLATION  07/10/2011  HER OPTION - office performed   LAPAROSCOPIC APPENDECTOMY N/A 09/03/2021   Procedure: APPENDECTOMY LAPAROSCOPIC;  Surgeon: Rubin Calamity, MD;  Location: [facility];  Service: General;  Laterality: N/A;   TONSILLECTOMY  07/22/1985    OB History     Gravida  3   Para  3   Term  3   Preterm      AB      Living  3      SAB      IAB      Ectopic      Multiple      Live Births  3            Home Medications    Prior to Admission medications    Medication Sig Start Date End Date Taking? Authorizing Provider  Cholecalciferol (VITAMIN D -3 PO) Take 50,000 Units by mouth once a week.   Yes [provider]  dexlansoprazole  (DEXILANT ) 60 MG capsule Take 1 capsule (60 mg total) by mouth daily. 03/28/23  Yes Beather Delon Gibson, PA  doxycycline  (VIBRAMYCIN ) 100 MG capsule Take 1 capsule (100 mg total) by mouth 2 (two) times daily for 7 days. 08/28/23 09/04/23 Yes StanhopeDorna HERO, FNP  fluticasone  (FLONASE ) 50 MCG/ACT nasal spray Place 2 sprays into both nostrils daily. 07/13/23  Yes Blair, Diane W, FNP  hyoscyamine  (LEVSIN  SL) 0.125 MG SL tablet Place 1 tablet (0.125 mg total) under the tongue every 6 (six) hours as needed for cramping. 07/29/23  Yes Armbruster, Elspeth SQUIBB, MD  naproxen  (NAPROSYN ) 500 MG tablet Take 1 tablet (500 mg total) by mouth 2 (two) times daily with a meal. Patient taking differently: Take 500 mg by mouth daily. 10/25/22  Yes Thedora Garnette HERO, MD  omeprazole  (PRILOSEC) 40 MG capsule Take 40 mg by mouth daily. 04/01/23  Yes [provider]  ondansetron  (ZOFRAN ) 4 MG tablet Take 1 tablet (4 mg total) by mouth every 6 (six) hours as needed for nausea or vomiting. 07/25/23  Yes Elnor Hila P, DO  Peppermint Oil (IBGARD) 90 MG CPCR Take as directed 07/29/23  Yes Armbruster, Elspeth SQUIBB, MD  predniSONE  (DELTASONE ) 20 MG tablet Take 2 tablets (40 mg total) by mouth daily with breakfast for 5 days. 08/28/23 09/02/23 Yes Enedelia Dorna HERO, FNP  ZOLMitriptan  (ZOMIG ) 2.5 MG tablet Take 1 tablet (2.5 mg total) by mouth once for 1 dose. May repeat in 2 hours if headache persists or recurs. 10/11/21 11/07/97 Yes Berneta Elsie Sayre, MD  AMBULATORY NON FORMULARY MEDICATION Diltiazem gel with 2% lidocaine   Apply a pea sized amount into your rectum as needed Dispense 30 GM zero refill 07/29/23   Armbruster, Elspeth SQUIBB, MD  benzonatate  (TESSALON ) 200 MG capsule Take 1 capsule (200 mg total) by mouth 2 (two) times daily as needed for  cough. 07/13/23   Blair, Diane W, FNP  cyclobenzaprine  (FLEXERIL ) 5 MG tablet Take 1 tablet (5 mg total) by mouth 3 (three) times daily as needed for muscle spasms. 10/15/22   Prentiss Riggs A, NP  diphenhydrAMINE  HCl (BENADRYL  ALLERGY PO) Take by mouth as needed.    [provider]  DULoxetine  (CYMBALTA ) 30 MG capsule Take 1 capsule (30 mg total) by mouth daily. 07/31/23   Armbruster, Elspeth SQUIBB, MD    Family History Family History  Problem Relation Age of Onset   Colon polyps Mother    Colon cancer Mother    Diabetes Mother    Hypertension Mother    Cancer Mother 50  intestine and ovarian   Diverticulitis Mother    Depression Mother    Anxiety disorder Mother    Sleep apnea Mother    Stroke Father    Diabetes Father    Hypertension Father    Heart disease Father    Heart attack Father        x 2   Colon cancer Sister    Colon polyps Sister    Rectal cancer Sister    Cancer Sister 43       ovarian and intestine   Cancer Brother 24       GI cancer   Breast cancer Maternal Aunt 45   Cancer Paternal Aunt 30       stomach   Cancer Maternal Grandmother 39       ovarian and pancreatic   Cancer Cousin 39       ovarian   Cancer Cousin 47       breast   Crohn's disease Neg Hx    Esophageal cancer Neg Hx    Stomach cancer Neg Hx    Ulcerative colitis Neg Hx     Social History Social History   Tobacco Use   Smoking status: Some Days    Types: Cigarettes   Smokeless tobacco: Never  Vaping Use   Vaping status: Never Used  Substance Use Topics   Alcohol use: Yes    Comment: occ   Drug use: No     Allergies   Aspirin, Augmentin [amoxicillin -pot clavulanate], and Oxycodone    Review of Systems Review of Systems  Respiratory:  Positive for cough.   Neurological:  Positive for headaches.  Per HPI   Physical Exam Triage Vital Signs ED Triage Vitals  Encounter Vitals Group     BP 08/28/23 2021 119/73     Systolic BP Percentile --      Diastolic  BP Percentile --      Pulse Rate 08/28/23 2021 66     Resp 08/28/23 2021 16     Temp 08/28/23 2021 98.4 F (36.9 C)     Temp Source 08/28/23 2021 Oral     SpO2 08/28/23 2021 100 %     Weight 08/28/23 2021 128 lb (58.1 kg)     Height 08/28/23 2021 5' 8.5 (1.74 m)     Head Circumference --      Peak Flow --      Pain Score 08/28/23 2019 9     Pain Loc --      Pain Education --      Exclude from Growth Chart --    No data found.  Updated Vital Signs BP 119/73 (BP Location: Right Arm)   Pulse 66   Temp 98.4 F (36.9 C) (Oral)   Resp 16   Ht 5' 8.5 (1.74 m)   Wt 128 lb (58.1 kg)   LMP 03/14/2018   SpO2 100%   BMI 19.18 kg/m   Visual Acuity Right Eye Distance:   Left Eye Distance:   Bilateral Distance:    Right Eye Near:   Left Eye Near:    Bilateral Near:     Physical Exam Vitals and nursing note reviewed.  Constitutional:      Appearance: She is not ill-appearing or toxic-appearing.  HENT:     Head: Normocephalic and atraumatic.     Right Ear: Hearing, ear canal and external ear normal. A middle ear effusion is present.     Left Ear: Hearing, ear canal and external ear  normal. A middle ear effusion is present.     Nose: Congestion present.     Right Sinus: Maxillary sinus tenderness present.     Left Sinus: Maxillary sinus tenderness present.     Mouth/Throat:     Lips: Pink.     Mouth: Mucous membranes are moist. No injury or oral lesions.     Dentition: Normal dentition.     Tongue: No lesions.     Pharynx: Oropharynx is clear. Uvula midline. Posterior oropharyngeal erythema present. No pharyngeal swelling, oropharyngeal exudate, uvula swelling or postnasal drip.     Tonsils: No tonsillar exudate.     Comments: Mild erythema to posterior oropharynx with moderate amount of clear postnasal drainage visualized. Eyes:     General: Lids are normal. Vision grossly intact. Gaze aligned appropriately.     Extraocular Movements: Extraocular movements intact.      Conjunctiva/sclera: Conjunctivae normal.  Neck:     Trachea: Trachea normal.     Comments: Hoarse voice. Non-muffled. No trismus, phonation normal, maintaining secretions without difficulty.  Cardiovascular:     Rate and Rhythm: Normal rate and regular rhythm.     Heart sounds: Normal heart sounds, S1 normal and S2 normal.  Pulmonary:     Effort: Pulmonary effort is normal. No respiratory distress.     Breath sounds: Normal breath sounds and air entry. No wheezing, rhonchi or rales.  Chest:     Chest wall: No tenderness.  Musculoskeletal:     Cervical back: Neck supple.     Right lower leg: No edema.     Left lower leg: No edema.  Lymphadenopathy:     Cervical: No cervical adenopathy.  Skin:    General: Skin is warm and dry.     Capillary Refill: Capillary refill takes less than 2 seconds.     Findings: No rash.  Neurological:     General: No focal deficit present.     Mental Status: She is alert and oriented to person, place, and time. Mental status is at baseline.     Cranial Nerves: No dysarthria or facial asymmetry.  Psychiatric:        Mood and Affect: Mood normal.        Speech: Speech normal.        Behavior: Behavior normal.        Thought Content: Thought content normal.        Judgment: Judgment normal.      UC Treatments / Results  Labs (all labs ordered are listed, but only abnormal results are displayed) Labs Reviewed - No data to display  EKG   Radiology No results found.  Procedures Procedures (including critical care time)  Medications Ordered in UC Medications - No data to display  Initial Impression / Assessment and Plan / UC Course  I have reviewed the triage vital signs and the nursing notes.  Pertinent labs & imaging results that were available during my care of the patient were reviewed by me and considered in my medical decision making (see chart for details).   1. Acute bacterial sinusitis, bilateral eustachian tube dysfunction, acute  cough Presentation is consistent with acute postviral bacterial sinusitis.   Symptoms have been present for greater than 8 days and have not responded well to over-the-counter therapies, therefore may have antibiotic.  Allergic to penicillins- doxycycline  ordered.  Recommend supportive care for symptomatic relief as outlined in AVS.  Deferred imaging of the chest based on stable cardiopulmonary exam and hemodynamically stable vital  signs.  Eustachian tube dysfunction likely secondary to pressure changes from recent flying and congestion. Will treat with prednisone  burst.   Counseled patient on potential for adverse effects with medications prescribed/recommended today, strict ER and return-to-clinic precautions discussed, patient verbalized understanding.   Final Clinical Impressions(s) / UC Diagnoses   Final diagnoses:  Bacterial sinusitis  Dysfunction of both eustachian tubes  Acute cough     Discharge Instructions      Your evaluation shows you have a bacterial sinus infection. - Take antibiotic sent to pharmacy as directed to treat sinus infection. - Prednisone  once daily for 5 days.  - Purchase Mucinex  over the counter and take this every 12 hours as needed for nasal congestion and cough. - Warm compresses to the cheeks and forehead as needed to help with sinus headaches as well as tylenol  as needed.  If you develop any new or worsening symptoms or if your symptoms do not start to improve, please return here or follow-up with your primary care provider. If your symptoms are severe, please go to the emergency room.      ED Prescriptions     Medication Sig Dispense Auth. Provider   doxycycline  (VIBRAMYCIN ) 100 MG capsule Take 1 capsule (100 mg total) by mouth 2 (two) times daily for 7 days. 14 capsule Camyla Camposano M, FNP   predniSONE  (DELTASONE ) 20 MG tablet Take 2 tablets (40 mg total) by mouth daily with breakfast for 5 days. 10 tablet Enedelia Dorna HERO, FNP       PDMP not reviewed this encounter.   Enedelia Dorna HERO, [place] 08/29/23 [place]

## 2023-08-29 ENCOUNTER — Other Ambulatory Visit (HOSPITAL_COMMUNITY): Payer: Self-pay

## 2023-09-05 ENCOUNTER — Other Ambulatory Visit (HOSPITAL_COMMUNITY): Payer: Self-pay | Attending: Medical Genetics

## 2023-10-02 ENCOUNTER — Encounter (INDEPENDENT_AMBULATORY_CARE_PROVIDER_SITE_OTHER): Payer: Self-pay | Admitting: Family Medicine

## 2023-10-02 ENCOUNTER — Ambulatory Visit (INDEPENDENT_AMBULATORY_CARE_PROVIDER_SITE_OTHER): Admitting: Family Medicine

## 2023-10-02 VITALS — BP 106/61 | HR 81 | Temp 98.3°F | Ht 68.0 in | Wt 125.0 lb

## 2023-10-02 DIAGNOSIS — R419 Unspecified symptoms and signs involving cognitive functions and awareness: Secondary | ICD-10-CM

## 2023-10-02 DIAGNOSIS — F32A Depression, unspecified: Secondary | ICD-10-CM | POA: Diagnosis not present

## 2023-10-02 DIAGNOSIS — Z681 Body mass index (BMI) 19 or less, adult: Secondary | ICD-10-CM

## 2023-10-02 DIAGNOSIS — E46 Unspecified protein-calorie malnutrition: Secondary | ICD-10-CM

## 2023-10-02 DIAGNOSIS — R634 Abnormal weight loss: Secondary | ICD-10-CM

## 2023-10-02 DIAGNOSIS — Z8659 Personal history of other mental and behavioral disorders: Secondary | ICD-10-CM

## 2023-10-02 DIAGNOSIS — F419 Anxiety disorder, unspecified: Secondary | ICD-10-CM

## 2023-10-02 DIAGNOSIS — R1084 Generalized abdominal pain: Secondary | ICD-10-CM

## 2023-10-02 DIAGNOSIS — R109 Unspecified abdominal pain: Secondary | ICD-10-CM

## 2023-10-02 DIAGNOSIS — R569 Unspecified convulsions: Secondary | ICD-10-CM

## 2023-10-02 NOTE — Progress Notes (Signed)
 Office: [phone]  /  Fax: [phone]  WEIGHT SUMMARY AND BIOMETRICS  Anthropometric Measurements Height: 5\' 8"  (1.727 m) Weight: 125 lb (56.7 kg) BMI (Calculated): 19.01 Weight at Last Visit: 131 lb Weight Lost Since Last Visit: 6 lb Weight Gained Since Last Visit: 0 lb Starting Weight: 207 lb Total Weight Loss (lbs): 82 lb (37.2 kg)   Body Composition  Body Fat %: 24.3 % Fat Mass (lbs): 30.6 lbs Muscle Mass (lbs): 90.4 lbs Total Body Water (lbs): 67.8 lbs Visceral Fat Rating : 5   Other Clinical Data Fasting: no Labs: no Today's Visit #: 28 Starting Date: 01/04/22    Chief Complaint: OBESITY   History of Present Illness   The patient presents with ongoing weight loss and abdominal pain.  She has been experiencing ongoing weight loss despite previous workups and treatments. With a history of obesity, she has been undergoing treatment for weight loss, successfully losing eighty-three pounds. However, she continues to lose weight beyond the expected plateau, with a current BMI of 19, indicating calorie and protein malnutrition. She feels hungry but is unable to eat adequately, contributing to further weight loss.  She has a history of abdominal pain and has been evaluated multiple times by a gastroenterologist, but no definitive cause has been identified. The abdominal pain makes it difficult for her to eat, contributing to her weight loss. She experiences soreness and pain in her stomach a few moments after eating. Although the nausea has improved, the pain persists and is described as 'soreness'. She is currently taking Miralax daily to manage her symptoms, but she experiences either diarrhea or constipation without a middle ground.  She reports experiencing word searching difficulties, struggling to find the right words during conversations, and using incorrect words. These episodes are associated with a buzzing sensation in her head and a different taste in her  mouth. She also reports feeling emotionally unstable and experiencing intense head pain on the right side, which comes and goes and is described as aggravating.  She has a history of seizures dating back to her adolescence following a head injury. Initially, the seizures involved full-body shaking and falling to the floor, but they have changed in nature over the past year. She now experiences episodes with a buzzing vibration in her head, a different taste in her mouth, and a sensation of being lost or not present, sometimes accompanied by body aches and soreness afterward. Despite a referral made in September by her PCP, she has not been able to secure an appointment with a neurologist. Her daughter has expressed concern and has been trying to follow up on the referral.  She is experiencing significant stress due to personal and family issues, including moving and her daughter's recent miscarriage. 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 She has not been taking her prescribed medications, due to stress and family issues. Her PCP recommended changing her Lexapro to Cymbalta, but she does not want to do to do this, and is declining to take Cymbalta.          PHYSICAL EXAM:  Blood pressure 106/61, pulse 81, temperature 98.3 F (36.8 C), height 5\' 8"  (1.727 m), weight 125 lb (56.7 kg), last menstrual period 03/14/2018, SpO2 100%. Body mass index is 19.01 kg/m.  DIAGNOSTIC DATA REVIEWED:  BMET    Component Value Date/Time   NA 137 07/25/2023 0910   NA 140  04/10/2023 1124   K 4.5 07/25/2023 0910   CL 104 07/25/2023 0910   CO2 24 07/25/2023 0910   GLUCOSE 92 07/25/2023 0910   BUN 28 (H) 07/25/2023 0910   BUN 25 (H) 04/10/2023 1124   CREATININE 0.88 07/25/2023 0910   CREATININE 0.91 08/12/2016 0837   CALCIUM 10.1 07/25/2023 0910   GFRNONAA >60 07/25/2023 0910   Lab Results  Component Value Date    HGBA1C 5.6 02/05/2023   HGBA1C 5.9 08/30/2019   Lab Results  Component Value Date   INSULIN 19.9 11/07/2021   Lab Results  Component Value Date   TSH 0.687 04/10/2023   CBC    Component Value Date/Time   WBC 6.8 07/25/2023 0910   RBC 4.40 07/25/2023 0910   HGB 13.1 07/25/2023 0910   HGB 12.3 04/10/2023 1124   HCT 38.8 07/25/2023 0910   HCT 38.6 04/10/2023 1124   PLT 367 07/25/2023 0910   PLT 407 04/10/2023 1124   MCV 88.2 07/25/2023 0910   MCV 95 04/10/2023 1124   MCH 29.8 07/25/2023 0910   MCHC 33.8 07/25/2023 0910   RDW 13.0 07/25/2023 0910   RDW 12.8 04/10/2023 1124   Iron Studies No results found for: "IRON", "TIBC", "FERRITIN", "IRONPCTSAT" Lipid Panel     Component Value Date/Time   CHOL 221 (H) 11/07/2021 1029   TRIG 125 11/07/2021 1029   HDL 44 11/07/2021 1029   CHOLHDL 5.0 (H) 11/07/2021 1029   CHOLHDL 6 06/18/2021 1135   VLDL 66.2 (H) 06/18/2021 1135   LDLCALC 154 (H) 11/07/2021 1029   LDLDIRECT 156.0 06/18/2021 1135   Hepatic Function Panel     Component Value Date/Time   PROT 7.4 07/25/2023 0910   PROT 7.0 04/10/2023 1124   ALBUMIN 4.5 07/25/2023 0910   ALBUMIN 4.7 04/10/2023 1124   AST 18 07/25/2023 0910   ALT 20 07/25/2023 0910   ALKPHOS 53 07/25/2023 0910   BILITOT 0.8 07/25/2023 0910   BILITOT 0.3 04/10/2023 1124   BILIDIR 0.1 04/16/2022 1629      Component Value Date/Time   TSH 0.687 04/10/2023 1124   Nutritional Lab Results  Component Value Date   VD25OH 45.2 04/10/2023   VD25OH 69.0 04/30/2022   VD25OH 24.2 (L) 11/07/2021     Assessment and Plan    Unintentional weight loss and malnutrition   Significant weight loss with a BMI of 19 indicates calorie and protein malnutrition. Despite previous weight loss efforts, she continues to lose weight beyond the recommended level which she states is unintentional. Persistent abdominal pain leads to difficulty eating, contributing to malnutrition. Previous GI workups have not identified  a cause. She states she had anorexia when she was younger. She was referred to an eating disorder clinic but did not attend.   - Refer to a nutrition group at [facility] for specialized dietary support   - Encourage liquid nutrition and protein intake, including options like Ensure or Fairlife protein drinks  -Concern for recurrence of anorexia complicated by other health issues   Abdominal pain   Chronic abdominal pain persists with soreness and nausea, affecting eating. Previous GI evaluations have not identified a specific cause. Pain occurs postprandially, She manages bowel regulation with Miralax.   - Continue to work with GI to help with symptoms - Refer to a nutrition group for dietary management to address abdominal pain and associated eating difficulties    Seizure-like episodes   Episodes resembling seizures, with a history dating back to adolescence following a  head injury. Recent episodes involve staring spells and confusion without full convulsions, occurring more frequently in the past year. Associated with a buzzing sensation and post-episode soreness. No current neurologist involvement due to referral issues.   - Initiate referral to a neurologist for evaluation of seizure-like episodes   - Advise family to video record any future episodes for neurologist review    Cognitive difficulties   Experiences word searching and memory issues, potentially related to seizure-like episodes or other neurological conditions. Concern for potential vitamin deficiencies contributing to symptoms.   - Initiate referral to a neurologist for cognitive evaluation   - Start a multivitamin to address potential deficiencies  which may be contributing to her cognition  Depression and anxiety   Depression and anxiety managed with Lexapro previously She prefers to take Lexapro due to concerns about Cymbalta, which she associates with negative experiences from family members.   - Restart Lexapro 10 mg  daily as she declines taking CYmbaltaCymbalta    Follow-up   Requires follow-up to ensure referrals and treatment plans are initiated and effective.   - Advise to send a MyChart message if no referral feedback is received by next Thursday   - Follow up as needed based on referral outcomes and treatment progress         I have personally spent 80 minutes total time today in preparation, patient care, and documentation for this visit, including the following: review of clinical lab tests; review of medical tests/procedures/services, setting up referrals and looking for programs which accept hr insurance.   She was informed of the importance of frequent follow up visits to maximize her success with intensive lifestyle modifications for her multiple health conditions.    Quillian Quince, MD

## 2023-10-07 ENCOUNTER — Encounter: Payer: Self-pay | Admitting: Neurology

## 2023-10-07 ENCOUNTER — Ambulatory Visit: Admitting: Neurology

## 2023-10-07 VITALS — BP 120/86 | Resp 16 | Ht 69.0 in | Wt 125.0 lb

## 2023-10-07 DIAGNOSIS — R569 Unspecified convulsions: Secondary | ICD-10-CM

## 2023-10-07 DIAGNOSIS — Z87898 Personal history of other specified conditions: Secondary | ICD-10-CM

## 2023-10-07 NOTE — Progress Notes (Signed)
 [facility]  PATIENT: Anne Lewis DOB: 1966-04-19  REQUESTING CLINICIAN: Wilder Glade, MD HISTORY FROM: Patient  REASON FOR VISIT: Seizure like activity/Word finding difficulty    HISTORICAL  CHIEF COMPLAINT:  Chief Complaint  Patient presents with   New Patient (Initial Visit)    Rm12, alone, Urgent internal referral for cognitive complaints:moca score was 23, seizure like activity:last sz was 1.5 weeks ago     HISTORY OF PRESENT ILLNESS:  This is a 58 year old woman past medical history including hypertension, depression, anxiety, history of prediabetes, history of seizures as a teenager who is presenting with complaint of word finding difficulty, memory loss and new events concerning for seizures. For her history of seizures, she tells me that she suffered head trauma at the age of 53; following the head trauma, she started having generalized convulsion.  She was put on Dilantin for about 8 years.  She has been doing well off Dilantin, has not had any additional seizures but lately in the past year she has been having experiencing episodes that are concerning for seizure.  These new episode started after a fall and head laceration in September 2024. She tells me the episodes usually start with a zapping feeling on her head then on occasion she will have episodes of behavioral arrest that she is not aware but family has reported it.  She reports back in November she had episode of behavioral arrest, right arm extended and shaking and unresponsive to children's voices.  Then last weekend, she had another episodes of behavioral arrest witnessed by her daughter lasting about a minute.  Patient also tells me that sometimes she has a zapping feeling without developing behavioral arrest, she is able to sit down and wait for episode to pass.  Denies any generalized convulsion, denies any episodes of tongue biting, denies waking up with blood in the pillow or urinary  incontinence.  She is also reporting that family has been complaining about her memory loss and forgetfulness.  It seems like children has been complaining that she is more forgetful, misplacing items, she tells me she does not think that she has memory loss and her main complaint is word finding difficulty.  She tells me during conversation she will have paraphasic error, and this has been very frustrating for her.  She is able to do her job normally, tells me that when she writes there are no issue. This is usually when speaking both in [place] and Spanish which is her native language. She is also reporting being under a lot of stress, was recommended medications but currently patient is not taking any medication.     OTHER MEDICAL CONDITIONS: Anxiety/Depression, prediabetes, History of seizures as a teenager    REVIEW OF SYSTEMS: Full 14 system review of systems performed and negative with exception of: As noted in the HPI   ALLERGIES: Allergies  Allergen Reactions   Aspirin Swelling and Other (See Comments)    Bleeding   Augmentin [Amoxicillin-Pot Clavulanate] Swelling   Oxycodone Nausea And Vomiting    HOME MEDICATIONS: Outpatient Medications Prior to Visit  Medication Sig Dispense Refill   AMBULATORY NON FORMULARY MEDICATION Diltiazem gel with 2% lidocaine  Apply a pea sized amount into your rectum as needed Dispense 30 GM zero refill 30 g 0   benzonatate (TESSALON) 200 MG capsule Take 1 capsule (200 mg total) by mouth 2 (two) times daily as needed for cough. 20 capsule 0   Cholecalciferol (VITAMIN D-3 PO) Take 50,000  Units by mouth once a week.     cyclobenzaprine (FLEXERIL) 5 MG tablet Take 1 tablet (5 mg total) by mouth 3 (three) times daily as needed for muscle spasms. 30 tablet 0   dexlansoprazole (DEXILANT) 60 MG capsule Take 1 capsule (60 mg total) by mouth daily. 30 capsule 11   diphenhydrAMINE HCl (BENADRYL ALLERGY PO) Take by mouth as needed.     DULoxetine (CYMBALTA)  30 MG capsule Take 1 capsule (30 mg total) by mouth daily. 30 capsule 0   fluticasone (FLONASE) 50 MCG/ACT nasal spray Place 2 sprays into both nostrils daily. 16 g 6   hyoscyamine (LEVSIN SL) 0.125 MG SL tablet Place 1 tablet (0.125 mg total) under the tongue every 6 (six) hours as needed for cramping. 60 tablet 1   naproxen (NAPROSYN) 500 MG tablet Take 1 tablet (500 mg total) by mouth 2 (two) times daily with a meal. (Patient taking differently: Take 500 mg by mouth daily.) 14 tablet 0   omeprazole (PRILOSEC) 40 MG capsule Take 40 mg by mouth daily.     ondansetron (ZOFRAN) 4 MG tablet Take 1 tablet (4 mg total) by mouth every 6 (six) hours as needed for nausea or vomiting. 12 tablet 0   Peppermint Oil (IBGARD) 90 MG CPCR Take as directed     ZOLMitriptan (ZOMIG) 2.5 MG tablet Take 1 tablet (2.5 mg total) by mouth once for 1 dose. May repeat in 2 hours if headache persists or recurs. 10 tablet 1   No facility-administered medications prior to visit.    PAST MEDICAL HISTORY: Past Medical History:  Diagnosis Date   Allergy    anemia    Anemia    Anxiety    Aortic atherosclerosis (HCC)    Arthritis    B12 deficiency    Back pain    BRCA negative 11/2013   Chest pain    Constipation    Depression    Fatty liver    Gallbladder problem    GERD (gastroesophageal reflux disease)    H pylori ulcer    Heartburn    Joint pain    Migraines    Palpitation    Pre-diabetes    Seizures (HCC)    3 YEARS AGO UPDATED 11/07/22   sleep apnea    Stomach ulcer    Ulcer of gastric fundus    Vitamin D deficiency     PAST SURGICAL HISTORY: Past Surgical History:  Procedure Laterality Date   CESAREAN SECTION  4098,1191   COLONOSCOPY     ENDOMETRIAL CRYOABLATION  07/10/2011   HER OPTION - office performed   LAPAROSCOPIC APPENDECTOMY N/A 09/03/2021   Procedure: APPENDECTOMY LAPAROSCOPIC;  Surgeon: Axel Filler, MD;  Location: [facility];  Service: General;  Laterality: N/A;   TONSILLECTOMY   07/22/1985    FAMILY HISTORY: Family History  Problem Relation Age of Onset   Colon polyps Mother    Colon cancer Mother    Diabetes Mother    Hypertension Mother    Cancer Mother 48       intestine and ovarian   Diverticulitis Mother    Depression Mother    Anxiety disorder Mother    Sleep apnea Mother    Stroke Father    Diabetes Father    Hypertension Father    Heart disease Father    Heart attack Father        x 2   Colon cancer Sister    Colon polyps Sister    Rectal  cancer Sister    Cancer Sister 36       ovarian and intestine   Cancer Brother 24       GI cancer   Breast cancer Maternal Aunt 91   Cancer Paternal Aunt 57       stomach   Cancer Maternal Grandmother 21       ovarian and pancreatic   Cancer Cousin 39       ovarian   Cancer Cousin 47       breast   Crohn's disease Neg Hx    Esophageal cancer Neg Hx    Stomach cancer Neg Hx    Ulcerative colitis Neg Hx     SOCIAL HISTORY: Social History   Socioeconomic History   Marital status: Married    Spouse name: Marine scientist   Number of children: Not on file   Years of education: Not on file   Highest education level: Bachelor's degree (e.g., BA, AB, BS)  Occupational History   Occupation: Oceanographer  Tobacco Use   Smoking status: Some Days    Types: Cigarettes   Smokeless tobacco: Never  Vaping Use   Vaping status: Never Used  Substance and Sexual Activity   Alcohol use: Yes    Comment: occ   Drug use: No   Sexual activity: Not Currently    Partners: Male    Birth control/protection: Post-menopausal, Abstinence  Other Topics Concern   Not on file  Social History Narrative   Not on file   Social Drivers of Health   Financial Resource Strain: High Risk (10/23/2022)   Overall Financial Resource Strain (CARDIA)    Difficulty of Paying Living Expenses: Hard  Food Insecurity: Food Insecurity Present (10/23/2022)   Hunger Vital Sign    Worried About Running Out of Food in the  Last Year: Sometimes true    Ran Out of Food in the Last Year: Often true  Transportation Needs: No Transportation Needs (10/23/2022)   PRAPARE - Administrator, Civil Service (Medical): No    Lack of Transportation (Non-Medical): No  Physical Activity: Sufficiently Active (10/23/2022)   Exercise Vital Sign    Days of Exercise per Week: 4 days    Minutes of Exercise per Session: 60 min  Stress: No Stress Concern Present (10/23/2022)   [employer] of Occupational Health - Occupational Stress Questionnaire    Feeling of Stress : Only a little  Social Connections: Moderately Isolated (10/23/2022)   Social Connection and Isolation Panel [NHANES]    Frequency of Communication with Friends and Family: More than three times a week    Frequency of Social Gatherings with Friends and Family: Twice a week    Attends Religious Services: 1 to 4 times per year    Active Member of [employer] or Organizations: No    Attends Engineer, structural: Not on file    Marital Status: Separated  Intimate Partner Violence: Not on file    PHYSICAL EXAM  GENERAL EXAM/CONSTITUTIONAL: Vitals:  Vitals:   10/07/23 1311  BP: 120/86  Resp: 16  Weight: 125 lb (56.7 kg)  Height: 5\' 9"  (1.753 m)   Body mass index is 18.46 kg/m. Wt Readings from Last 3 Encounters:  10/07/23 125 lb (56.7 kg)  10/02/23 125 lb (56.7 kg)  08/28/23 128 lb (58.1 kg)   Patient is in no distress; well developed, nourished and groomed; neck is supple  MUSCULOSKELETAL: Gait, strength, tone, movements noted in Neurologic exam below  NEUROLOGIC:  MENTAL STATUS:      No data to display         awake, alert, oriented to person, place and time recent and remote memory intact normal attention and concentration language fluent, comprehension intact, naming intact fund of knowledge appropriate     10/07/2023    1:15 PM  [facility] Cognitive Assessment   Visuospatial/ Executive (0/5) 4  Naming (0/3) 3  Attention:  Read list of digits (0/2) 2  Attention: Read list of letters (0/1) 1  Attention: Serial 7 subtraction starting at 100 (0/3) 1  Language: Repeat phrase (0/2) 0  Language : Fluency (0/1) 1  Abstraction (0/2) 2  Delayed Recall (0/5) 3  Orientation (0/6) 6  Total 23    CRANIAL NERVE:  2nd, 3rd, 4th, 6th - Visual fields full to confrontation, extraocular muscles intact, no nystagmus 5th - facial sensation symmetric 7th - facial strength symmetric 8th - hearing intact 9th - palate elevates symmetrically, uvula midline 11th - shoulder shrug symmetric 12th - tongue protrusion midline  MOTOR:  normal bulk and tone, full strength in the BUE, BLE  SENSORY:  normal and symmetric to light touch  COORDINATION:  finger-nose-finger, fine finger movements normal  GAIT/STATION:  normal   DIAGNOSTIC DATA (LABS, IMAGING, TESTING) - I reviewed patient records, labs, notes, testing and imaging myself where available.  Lab Results  Component Value Date   WBC 6.8 07/25/2023   HGB 13.1 07/25/2023   HCT 38.8 07/25/2023   MCV 88.2 07/25/2023   PLT 367 07/25/2023      Component Value Date/Time   NA 137 07/25/2023 0910   NA 140 04/10/2023 1124   K 4.5 07/25/2023 0910   CL 104 07/25/2023 0910   CO2 24 07/25/2023 0910   GLUCOSE 92 07/25/2023 0910   BUN 28 (H) 07/25/2023 0910   BUN 25 (H) 04/10/2023 1124   CREATININE 0.88 07/25/2023 0910   CREATININE 0.91 08/12/2016 0837   CALCIUM 10.1 07/25/2023 0910   PROT 7.4 07/25/2023 0910   PROT 7.0 04/10/2023 1124   ALBUMIN 4.5 07/25/2023 0910   ALBUMIN 4.7 04/10/2023 1124   AST 18 07/25/2023 0910   ALT 20 07/25/2023 0910   ALKPHOS 53 07/25/2023 0910   BILITOT 0.8 07/25/2023 0910   BILITOT 0.3 04/10/2023 1124   GFRNONAA >60 07/25/2023 0910   Lab Results  Component Value Date   CHOL 221 (H) 11/07/2021   HDL 44 11/07/2021   LDLCALC 154 (H) 11/07/2021   LDLDIRECT 156.0 06/18/2021   TRIG 125 11/07/2021   CHOLHDL 5.0 (H) 11/07/2021    Lab Results  Component Value Date   HGBA1C 5.6 02/05/2023   Lab Results  Component Value Date   VITAMINB12 456 04/10/2023   Lab Results  Component Value Date   TSH 0.687 04/10/2023    Head CT 04/03/2023 1. No evidence of intracranial or cervical spine injury. 2. Soft tissue gas at the anterior right scalp. No underlying fracture.    ASSESSMENT AND PLAN  58 y.o. year old female with medical condition including history of seizures, history of prediabetes, anxiety depression who is presenting with event concerning for seizures and also word finding difficulties.  First I would like to rule out seizures by getting a routine EEG, if EEG is normal then we will proceed with ambulatory EEG.  If any abnormality, will likely start the patient on antiseizure medication and obtain MRI brain.  This was discussed with patient is comfortable with plan.     1.  Seizure-like activity (HCC)   2. History of seizure      Patient Instructions  Ordered an EEG, I will contact you to go over the result.   If abnormal, will obtain MRI brain and likely start patient on medication. If normal, then we will consider 3-day ambulatory EEG.   This was discussed with patient and she is comfortable with plan.   Continue to follow with PCP return if worse.  Orders Placed This Encounter  Procedures   EEG adult    No orders of the defined types were placed in this encounter.   Return if symptoms worsen or fail to improve.    Windell Norfolk, MD 10/07/2023, 2:41 PM  [facility] [address] [place], [place] [address] [phone]

## 2023-10-07 NOTE — Patient Instructions (Addendum)
 Ordered an EEG, I will contact you to go over the result.   If abnormal, will obtain MRI brain and likely start patient on medication. If normal, then we will consider 3-day ambulatory EEG.   This was discussed with patient and she is comfortable with plan.   Continue to follow with PCP return if worse.

## 2023-10-09 ENCOUNTER — Ambulatory Visit (INDEPENDENT_AMBULATORY_CARE_PROVIDER_SITE_OTHER): Admitting: Family Medicine

## 2023-10-13 ENCOUNTER — Inpatient Hospital Stay: Admitting: Family Medicine

## 2023-10-14 ENCOUNTER — Encounter: Admitting: Registered"

## 2023-10-14 ENCOUNTER — Ambulatory Visit: Admitting: Neurology

## 2023-10-14 DIAGNOSIS — R569 Unspecified convulsions: Secondary | ICD-10-CM | POA: Diagnosis not present

## 2023-10-14 DIAGNOSIS — Z87898 Personal history of other specified conditions: Secondary | ICD-10-CM

## 2023-10-14 NOTE — Procedures (Signed)
   History:  58 year old woman with seizure like activity   EEG classification:  Awake and asleep  Duration: 26 minutes   Technical aspects: This EEG study was done with scalp electrodes positioned according to the 10-20 International system of electrode placement. Electrical activity was reviewed with band pass filter of 1-70Hz , sensitivity of 7 uV/mm, display speed of 42mm/sec with a 60Hz  notched filter applied as appropriate. EEG data were recorded continuously and digitally stored.   Description of the recording: The background rhythms of this recording consists of a fairly well modulated medium amplitude background activity of 11 Hz. As the record progresses, the patient initially is in the waking state, but appears to enter the early stage II sleep during the recording, with rudimentary sleep spindles and vertex sharp wave activity seen. During the wakeful state, photic stimulation was performed, and no abnormal responses were seen. Hyperventilation was also performed, no abnormal response seen. No epileptiform discharges seen during this recording. There was no focal slowing.   Abnormality: None   Impression: This is a normal awake and sleep EEG. No evidence of interictal epileptiform discharges. Normal EEGs, however, do not rule out epilepsy.    Windell Norfolk, MD [facility]

## 2023-10-15 ENCOUNTER — Encounter: Payer: Self-pay | Admitting: Neurology

## 2023-10-15 ENCOUNTER — Telehealth: Payer: Self-pay

## 2023-10-15 ENCOUNTER — Other Ambulatory Visit: Payer: Self-pay | Admitting: Neurology

## 2023-10-15 DIAGNOSIS — Z87898 Personal history of other specified conditions: Secondary | ICD-10-CM

## 2023-10-15 DIAGNOSIS — R569 Unspecified convulsions: Secondary | ICD-10-CM

## 2023-10-16 ENCOUNTER — Ambulatory Visit: Payer: 59 | Admitting: Nurse Practitioner

## 2023-10-23 ENCOUNTER — Ambulatory Visit: Admitting: Licensed Clinical Social Worker

## 2023-11-05 ENCOUNTER — Other Ambulatory Visit: Payer: Self-pay

## 2023-11-05 ENCOUNTER — Encounter (HOSPITAL_BASED_OUTPATIENT_CLINIC_OR_DEPARTMENT_OTHER): Payer: Self-pay

## 2023-11-05 ENCOUNTER — Emergency Department (HOSPITAL_BASED_OUTPATIENT_CLINIC_OR_DEPARTMENT_OTHER)
Admission: EM | Admit: 2023-11-05 | Discharge: 2023-11-05 | Disposition: A | Discharge status: Home / Self Care | Attending: Emergency Medicine | Admitting: Emergency Medicine

## 2023-11-05 ENCOUNTER — Ambulatory Visit: Admitting: Registered"

## 2023-11-05 DIAGNOSIS — R591 Generalized enlarged lymph nodes: Secondary | ICD-10-CM

## 2023-11-05 DIAGNOSIS — L259 Unspecified contact dermatitis, unspecified cause: Secondary | ICD-10-CM | POA: Diagnosis not present

## 2023-11-05 DIAGNOSIS — R590 Localized enlarged lymph nodes: Secondary | ICD-10-CM | POA: Diagnosis not present

## 2023-11-05 DIAGNOSIS — R21 Rash and other nonspecific skin eruption: Secondary | ICD-10-CM | POA: Diagnosis present

## 2023-11-05 LAB — COMPREHENSIVE METABOLIC PANEL WITH GFR
ALT: 16 U/L (ref 0–44)
AST: 19 U/L (ref 15–41)
Albumin: 4.8 g/dL (ref 3.5–5.0)
Alkaline Phosphatase: 66 U/L (ref 38–126)
Anion gap: 10 (ref 5–15)
BUN: 24 mg/dL — ABNORMAL HIGH (ref 6–20)
CO2: 26 mmol/L (ref 22–32)
Calcium: 10.3 mg/dL (ref 8.9–10.3)
Chloride: 103 mmol/L (ref 98–111)
Creatinine, Ser: 0.89 mg/dL (ref 0.44–1.00)
GFR, Estimated: >60 mL/min (ref >60)
Glucose, Bld: 95 mg/dL (ref 70–99)
Potassium: 4 mmol/L (ref 3.5–5.1)
Sodium: 139 mmol/L (ref 135–145)
Total Bilirubin: 0.6 mg/dL (ref 0.0–1.2)
Total Protein: 8.1 g/dL (ref 6.5–8.1)

## 2023-11-05 LAB — CBC WITH DIFFERENTIAL/PLATELET
Abs Immature Granulocytes: 0.03 10*3/uL (ref 0.00–0.07)
Basophils Absolute: 0.1 10*3/uL (ref 0.0–0.1)
Basophils Relative: 1 %
Eosinophils Absolute: 0.2 10*3/uL (ref 0.0–0.5)
Eosinophils Relative: 3 %
HCT: 42.1 % (ref 36.0–46.0)
Hemoglobin: 14 g/dL (ref 12.0–15.0)
Immature Granulocytes: 0 %
Lymphocytes Relative: 33 %
Lymphs Abs: 2.6 10*3/uL (ref 0.7–4.0)
MCH: 30.1 pg (ref 26.0–34.0)
MCHC: 33.3 g/dL (ref 30.0–36.0)
MCV: 90.5 fL (ref 80.0–100.0)
Monocytes Absolute: 0.5 10*3/uL (ref 0.1–1.0)
Monocytes Relative: 6 %
Neutro Abs: 4.4 10*3/uL (ref 1.7–7.7)
Neutrophils Relative %: 57 %
Platelets: 404 10*3/uL — ABNORMAL HIGH (ref 150–400)
RBC: 4.65 MIL/uL (ref 3.87–5.11)
RDW: 12.8 % (ref 11.5–15.5)
WBC: 7.8 10*3/uL (ref 4.0–10.5)
nRBC: 0 % (ref 0.0–0.2)

## 2023-11-05 LAB — RESP PANEL BY RT-PCR (RSV, FLU A&B, COVID)  RVPGX2
Influenza A by PCR: NEGATIVE
Influenza B by PCR: NEGATIVE
Resp Syncytial Virus by PCR: NEGATIVE
SARS Coronavirus 2 by RT PCR: NEGATIVE

## 2023-11-05 MED ORDER — ACETAMINOPHEN 325 MG PO TABS
650.0000 mg | ORAL_TABLET | Freq: Once | ORAL | Status: AC
  Administered 2023-11-05: 650 mg via ORAL
  Filled 2023-11-05: qty 2

## 2023-11-05 MED ORDER — TRIAMCINOLONE ACETONIDE 0.5 % EX OINT: 1.0000 | TOPICAL_OINTMENT | Freq: Two times a day (BID) | CUTANEOUS | 0 refills | Status: DC

## 2023-11-05 NOTE — Discharge Instructions (Signed)
 See your Physician for further evaluation of swollen lymph nodes

## 2023-11-05 NOTE — ED Triage Notes (Addendum)
 Pt states that 3 weeks ago she noticed a spot on her back, and now it has worsened. It hurts and itches, also had palpitations and fever. Now has swollen lymph nodes and swollen nodes in groin. Pt also endorses severe fatigue.  Anastacio Balm, RN

## 2023-11-06 ENCOUNTER — Telehealth: Payer: Self-pay

## 2023-11-06 NOTE — Transitions of Care (Post Inpatient/ED Visit) (Signed)
   11/06/2023  Name: BEV DRENNEN MRN: [id number] DOB: Mar 27, 1966  Today's TOC FU Call Status: Today's TOC FU Call Status:: Unsuccessful Call (1st Attempt) Unsuccessful Call (1st Attempt) Date: 11/06/23  Attempted to reach the patient regarding the most recent Inpatient/ED visit.  Follow Up Plan: Additional outreach attempts will be made to reach the patient to complete the Transitions of Care (Post Inpatient/ED visit) call.   Signature .cdw

## 2023-11-06 NOTE — ED Provider Notes (Signed)
 [facility] Provider Note   CSN: [id number] Arrival date & time: 11/05/23  1652     History  Chief Complaint  Patient presents with   Rash    Anne Lewis is a 58 y.o. female.  Patient complains of a rash on her lower back for several weeks.  Patient also reports multiple scattered lymph nodes.  Patient reports she has noticed a lymph node on the right side of her neck 1 on the back of her neck 3 in her right groin and 2 under her right arm.  Patient states that the rash is itchy and burns.  Patient denies any known exposures.  She has not been using any lotions.  Patient is very concerned about the lymph nodes.  Patient reports trying antibiotic cream and cortisone cream without relief to the rash.  Patient denies any fever or chills she has not had any cough or congestion.  Patient denies any insect bites.  She has not had any exposure to cats.  Patient denies any sore throat she has not had any cough  The history is provided by the patient. No language interpreter was used.  Rash      Home Medications Prior to Admission medications   Medication Sig Start Date End Date Taking? Authorizing Provider  triamcinolone  ointment (KENALOG ) 0.5 % Apply 1 Application topically 2 (two) times daily. 11/05/23  Yes Leiloni Smithers K, PA-C      Allergies    Aspirin, Augmentin [amoxicillin -pot clavulanate], and Oxycodone     Review of Systems   Review of Systems  Skin:  Positive for rash.  All other systems reviewed and are negative.   Physical Exam Updated Vital Signs BP 136/74 (BP Location: Right Arm)   Pulse 72   Temp 98.4 F (36.9 C) (Oral)   Resp 14   Ht 5' 8.5" (1.74 m)   Wt 53.5 kg   LMP 03/14/2018   SpO2 98%   BMI 17.68 kg/m  Physical Exam Vitals and nursing note reviewed.  Constitutional:      Appearance: She is well-developed.  HENT:     Head: Normocephalic.  Neck:     Comments: Small pea-sized lymph node right  neck.  Small lymph node occipital scalp Cardiovascular:     Rate and Rhythm: Normal rate.  Pulmonary:     Effort: Pulmonary effort is normal.  Abdominal:     General: There is no distension.  Musculoskeletal:        General: Normal range of motion.     Cervical back: Normal range of motion.     Comments: 2 small lymph nodes right axilla, 3 small lymph nodes right inguinal area  Lymphadenopathy:     Cervical: Cervical adenopathy present.  Skin:    General: Skin is warm.     Comments: Patient has a slightly raised erythematous rash lower sacral area bilaterally, looks like contact dermatitis.  Neurological:     General: No focal deficit present.     Mental Status: She is alert and oriented to person, place, and time.  Psychiatric:        Mood and Affect: Mood normal.     ED Results / Procedures / Treatments   Labs (all labs ordered are listed, but only abnormal results are displayed) Labs Reviewed  CBC WITH DIFFERENTIAL/PLATELET - Abnormal; Notable for the following components:      Result Value   Platelets 404 (*)    All other components within  normal limits  COMPREHENSIVE METABOLIC PANEL WITH GFR - Abnormal; Notable for the following components:   BUN 24 (*)    All other components within normal limits  RESP PANEL BY RT-PCR (RSV, FLU A&B, COVID)  [id number]    EKG EKG Interpretation Date/Time:  Wednesday November 05 2023 20:54:41 EDT Ventricular Rate:  69 PR Interval:  137 QRS Duration:  86 QT Interval:  414 QTC Calculation: 444 R Axis:   53  Text Interpretation: Sinus rhythm Consider right atrial enlargement Confirmed by Owen Blowers ([id number]) on 11/05/2023 10:21:50 PM  Radiology No results found.  Procedures Procedures    Medications Ordered in ED Medications  acetaminophen  (TYLENOL ) tablet 650 mg (650 mg Oral Given 11/05/23 2049)    ED Course/ Medical Decision Making/ A&P                                 Medical Decision Making Complains of a rash on her low  back that is itchy and burns for several weeks.  Patient reports scattered lymph nodes.  Amount and/or Complexity of Data Reviewed Labs: ordered. Decision-making details documented in ED Course.    Details: Flu COVID and influenza are negative.  Labs ordered reviewed and interpreted CBC shows white blood cell count is normal.  Platelets 404 been met no acute changes  Risk OTC drugs. Prescription drug management. Risk Details: Rash appears to be some type of contact dermatitis I will try patient on triamcinolone  cream.  I counseled patient on lymph nodes.  She has had these for the past 4 weeks I have advised her that she does need further evaluation with primary care.  Patient is advised to call her physician tomorrow to schedule an appointment.  Patient will need further testing and evaluation.  I discussed with her if lymphadenopathy persist she may need to have a biopsy.  Patient is discharged in stable condition with follow-up instructions.   I discussed the pt with Dr. Martina Sledge.         Final Clinical Impression(s) / ED Diagnoses Final diagnoses:  Lymphadenopathy  Contact dermatitis, unspecified contact dermatitis type, unspecified trigger    Rx / DC Orders ED Discharge Orders          Ordered    triamcinolone  ointment (KENALOG ) 0.5 %  2 times daily        11/05/23 2226           An After Visit Summary was printed and given to the patient.    Sandi Crosby, PA-C 11/06/23 0033    Quinn Bucco, DO 11/09/23 1428

## 2023-11-06 NOTE — Telephone Encounter (Signed)
 Copied from CRM [id number]. Topic: Appointments - Scheduling Inquiry for Clinic >> Nov 06, 2023  4:02 PM Albertha Alosa wrote: Reason for CRM: Patient called in to stating she had been released from the ER, and needs to be schedule for a hospital followup, soonest appointment was 04/28 , patient stated she needs something soone than that

## 2023-11-06 NOTE — Telephone Encounter (Signed)
 Please call to see if you can schedule her sooner.  Thanks. Dm/cma

## 2023-11-10 ENCOUNTER — Encounter: Payer: Self-pay | Admitting: Internal Medicine

## 2023-11-10 ENCOUNTER — Ambulatory Visit: Admitting: Internal Medicine

## 2023-11-10 VITALS — BP 116/70 | HR 74 | Temp 98.6°F | Ht 68.5 in | Wt 127.4 lb

## 2023-11-10 DIAGNOSIS — J0110 Acute frontal sinusitis, unspecified: Secondary | ICD-10-CM | POA: Diagnosis not present

## 2023-11-10 DIAGNOSIS — M2550 Pain in unspecified joint: Secondary | ICD-10-CM

## 2023-11-10 DIAGNOSIS — R590 Localized enlarged lymph nodes: Secondary | ICD-10-CM | POA: Diagnosis not present

## 2023-11-10 DIAGNOSIS — L259 Unspecified contact dermatitis, unspecified cause: Secondary | ICD-10-CM | POA: Diagnosis not present

## 2023-11-10 MED ORDER — DOXYCYCLINE HYCLATE 100 MG PO TABS
100.0000 mg | ORAL_TABLET | Freq: Two times a day (BID) | ORAL | 0 refills | Status: AC
Start: 2023-11-10 — End: 2023-11-17

## 2023-11-10 NOTE — Progress Notes (Signed)
 [facility] [address] [place] [address] Dept: [phone] Dept Fax: [phone]  Acute Care Office Visit  Subjective:   Anne Lewis 1965/08/21 11/10/2023  Chief Complaint  Patient presents with   Rash    Rash started 2-3 weeks ago. Hips she stated pain.     HPI: Discussed the use of AI scribe software for clinical note transcription with the patient, who gave verbal consent to proceed.  History of Present Illness   Anne Lewis is a 58 year old female who presents with a persistent rash and lymphadenopathy.  She has a three-week history of a rash that began as a small itchy patch on the top of her right buttock. Initially, she attributed it to allergies and took allergy medications, which provided intermittent relief. However, the rash spread upwards towards her spine, becoming increasingly painful, burning, and hot to the touch. She describes the sensation as 'like needles' and notes that the skin now feels 'like leather'.  She has swollen lymph nodes, initially noticed as large lumps in the right hip area.  Additional swollen nodes are present in her right arm and neck. She associates these with previous infections, noting that her lymph nodes typically swell during such times.  She has been experiencing severe headaches since last Wednesday, which have been unrelieved by Tylenol  given in the ER. The headaches are described as 'brutal', with pressure on one side of her head, and have been severe enough to prevent sleep. She also reports chills, but no fever, runny nose, sore throat, or nasal congestion, although she feels pressure in her sinuses.   She also reports joint pain, particularly in her shoulders, which has been present for about a week.  Her current medications include Allegra, Zyrtec , and Benadryl  for allergies.        The following portions of the patient's history were reviewed and  updated as appropriate: past medical history, past surgical history, family history, social history, allergies, medications, and problem list.   Patient Active Problem List   Diagnosis Date Noted   Right lower quadrant pain 04/10/2023   Protein-calorie malnutrition (HCC) 04/10/2023   Pharyngeal dysphagia 04/10/2023   GI problem 02/12/2023   BMI 20.0-20.9, adult 10/22/2022   Gastroesophageal reflux disease with esophagitis without hemorrhage 08/08/2022   Elevated blood pressure reading without diagnosis of hypertension 08/08/2022   Nausea and vomiting in adult 04/30/2022   Depression 04/30/2022   Generalized obesity 04/30/2022   Depression with anxiety 03/19/2022   Facial cellulitis 01/24/2022   Ecchymosis 12/11/2021   Rash 12/11/2021   Pre-diabetes 12/11/2021   Acute appendicitis 09/03/2021   Perimenopausal 06/18/2021   Vitamin D  deficiency 06/18/2021   Intractable headache 06/18/2021   Sciatica of right side 01/11/2021   Paresthesias 08/30/2019   Weight gain 08/30/2019   Dysthymia 08/30/2019   Reactive airway disease 10/06/2018   Osteoarthritis of spine with radiculopathy, cervical region 04/16/2018   Urticaria 04/16/2018   Tooth pain 04/16/2018   Acute non-recurrent sinusitis 08/19/2017   Lateral epicondylitis of left elbow 08/19/2017   Cough 08/19/2017   Healthcare maintenance 08/19/2017   Family history of malignant neoplasm of breast 11/08/2013   Family history of malignant neoplasm of gastrointestinal tract 11/08/2013   Family history of malignant neoplasm of ovary 11/08/2013   RUQ PAIN 08/08/2010   DYSPEPSIA&OTHER SPEC DISORDERS FUNCTION STOMACH 08/07/2010   ABDOMINAL PAIN, EPIGASTRIC 08/07/2010   OTHER CONSTIPATION 12/05/2009   Past Medical History:  Diagnosis Date   Allergy  anemia    Anemia    Anxiety    Aortic atherosclerosis (HCC)    Arthritis    B12 deficiency    Back pain    BRCA negative 11/2013   Chest pain    Constipation    Depression     Fatty liver    Gallbladder problem    GERD (gastroesophageal reflux disease)    H pylori ulcer    Heartburn    Joint pain    Migraines    Palpitation    Pre-diabetes    Seizures (HCC)    3 YEARS AGO UPDATED 11/07/22   sleep apnea    Stomach ulcer    Ulcer of gastric fundus    Vitamin D  deficiency    Past Surgical History:  Procedure Laterality Date   CESAREAN SECTION  1610,9604   COLONOSCOPY     ENDOMETRIAL CRYOABLATION  07/10/2011   HER OPTION - office performed   LAPAROSCOPIC APPENDECTOMY N/A 09/03/2021   Procedure: APPENDECTOMY LAPAROSCOPIC;  Surgeon: Shela Derby, MD;  Location: [facility];  Service: General;  Laterality: N/A;   TONSILLECTOMY  07/22/1985   Family History  Problem Relation Age of Onset   Colon polyps Mother    Colon cancer Mother    Diabetes Mother    Hypertension Mother    Cancer Mother 29       intestine and ovarian   Diverticulitis Mother    Depression Mother    Anxiety disorder Mother    Sleep apnea Mother    Stroke Father    Diabetes Father    Hypertension Father    Heart disease Father    Heart attack Father        x 2   Colon cancer Sister    Colon polyps Sister    Rectal cancer Sister    Cancer Sister 32       ovarian and intestine   Cancer Brother 24       GI cancer   Breast cancer Maternal Aunt 44   Cancer Paternal Aunt 42       stomach   Cancer Maternal Grandmother 83       ovarian and pancreatic   Cancer Cousin 39       ovarian   Cancer Cousin 47       breast   Crohn's disease Neg Hx    Esophageal cancer Neg Hx    Stomach cancer Neg Hx    Ulcerative colitis Neg Hx     Current Outpatient Medications:    doxycycline  (VIBRA -TABS) 100 MG tablet, Take 1 tablet (100 mg total) by mouth 2 (two) times daily for 7 days., Disp: 14 tablet, Rfl: 0   triamcinolone  ointment (KENALOG ) 0.5 %, Apply 1 Application topically 2 (two) times daily., Disp: 30 g, Rfl: 0 Allergies  Allergen Reactions   Aspirin Swelling and Other (See  Comments)    Bleeding   Augmentin [Amoxicillin -Pot Clavulanate] Swelling   Oxycodone  Nausea And Vomiting     ROS: A complete ROS was performed with pertinent positives/negatives noted in the HPI. The remainder of the ROS are negative.    Objective:   Today's Vitals   11/10/23 1515  BP: 116/70  Pulse: 74  Temp: 98.6 F (37 C)  TempSrc: Temporal  SpO2: 99%  Weight: 127 lb 6.4 oz (57.8 kg)  Height: 5' 8.5" (1.74 m)    GENERAL: Well-appearing, in NAD. Well nourished.  SKIN: Pink, warm and dry. No  lesion, ulceration, or ecchymoses. Localized  maculopapular erythematous rash to mid lumbar region. No pustules or vesicles. Palpable, non-tender moveable nodules to R. Axilla and R. Groin HEENT:    HEAD: Normocephalic, non-traumatic.  EYES: Conjunctive pink without exudate.  EARS: External ear w/o redness, swelling, masses, or lesions. EAC clear. TM's intact, translucent w/o bulging, appropriate landmarks visualized.  NOSE: Septum midline w/o deformity. Nares patent, mucosa pink and non-inflamed w/o drainage. (+) right frontal sinus tenderness.  THROAT: Uvula midline. Oropharynx clear. Mucus membranes pink and moist.  NECK: Trachea midline. Full ROM w/o pain or tenderness. No lymphadenopathy.  RESPIRATORY: Chest wall symmetrical. Respirations even and non-labored. Breath sounds clear to auscultation bilaterally.  CARDIAC: S1, S2 present, regular rate and rhythm. Peripheral pulses 2+ bilaterally.  MSK: Muscle tone and strength appropriate for age. Joints w/o redness, or swelling. EXTREMITIES: Without clubbing, cyanosis, or edema.  NEUROLOGIC:  Steady, even gait.  PSYCH/MENTAL STATUS: Alert, oriented x 3. Cooperative, appropriate mood and affect.    No results found for any visits on 11/10/23.    Assessment & Plan:  Assessment and Plan    Acute frontal sinusitis Severe headache and frontal pressure suggest acute frontal sinusitis. Differential includes sinus infection or viral  etiology. Blood work unremarkable. Short course of antibiotics warranted. - Prescribe doxycycline  100 mg BID for 7 days. - Continue Allegra.  Lymphadenopathy Swollen non-tender lymph nodes in right axillary, inguinal regions, neck, and right arm. Possible reactive lymphadenopathy. Blood work unremarkable. - Order ultrasound of axillary and inguinal regions. -  CBC and CMP  Arthralgia Joint pain in shoulders and rib cage. Differential includes inflammatory or autoimmune process. Blood tests necessary. - Order blood tests: rheumatoid factor, sed rate, CRP, ANA.  Contact dermatitis Localized erythematous rash in lumbar region consistent with contact dermatitis. Current management with topical steroids appropriate. - Continue triamcinolone  cream. - Advise to air out affected area and avoid irritants.       Meds ordered this encounter  Medications   doxycycline  (VIBRA -TABS) 100 MG tablet    Sig: Take 1 tablet (100 mg total) by mouth 2 (two) times daily for 7 days.    Dispense:  14 tablet    Refill:  0    Supervising Provider:   Catheryn Cluck [[id number]]   Orders Placed This Encounter  Procedures   US  AXILLA RIGHT    Standing Status:   Future    Expiration Date:   11/09/2024    Reason for Exam (SYMPTOM  OR DIAGNOSIS REQUIRED):   enlarged lymph node to R. axilla    Preferred imaging location?:   [facility]   US  PELVIS LIMITED (TRANSABDOMINAL ONLY)    Standing Status:   Future    Expiration Date:   11/09/2024    Reason for Exam (SYMPTOM  OR DIAGNOSIS REQUIRED):   enlarged lymph node to right inguinal region    Preferred imaging location?:   [facility]   CBC with Differential/Platelet   Comp Met (CMET)   Sedimentation rate   C-reactive protein   ANA w/Reflex   Rheumatoid Factor   Lab Orders         CBC with Differential/Platelet         Comp Met (CMET)         Sedimentation rate         C-reactive protein         ANA w/Reflex         Rheumatoid Factor      No images are attached to the encounter  or orders placed in the encounter.  Return if symptoms worsen or fail to improve.   Gavin Kast, FNP

## 2023-11-10 NOTE — Transitions of Care (Post Inpatient/ED Visit) (Signed)
   11/10/2023  Name: Anne Lewis MRN: [id number] DOB: 1965/09/24  Today's TOC FU Call Status: Today's TOC FU Call Status:: Successful TOC FU Call Completed Unsuccessful Call (1st Attempt) Date: 11/06/23 [facility] FU Call Complete Date: 11/10/23 Patient's Name and Date of Birth confirmed.  Transition Care Management Follow-up Telephone Call Date of Discharge: 11/05/23 Discharge Facility: [facility] Name of Other (Non-[facility]) Discharge Facility: [facility] Type of Discharge: Emergency Department Reason for ED Visit: Other: (Lymphadenopathy and rash) How have you been since you were released from the hospital?: Same Any questions or concerns?: Yes Patient Questions/Concerns:: Pt C/O of swollen lymph nodes; rash, and headache Patient Questions/Concerns Addressed: Provided Patient Educational Materials  Items Reviewed: Did you receive and understand the discharge instructions provided?: Yes Medications obtained,verified, and reconciled?: Yes (Medications Reviewed) Any new allergies since your discharge?: No Dietary orders reviewed?: NA Do you have support at home?: No  Medications Reviewed Today: Medications Reviewed Today     Reviewed by Lonie Roa, CMA (Certified Medical Assistant) on 11/10/23 at 1204  Med List Status: <None>   Medication Order Taking? Sig Documenting Provider Last Dose Status Informant  triamcinolone  ointment (KENALOG ) 0.5 % [id number] Yes Apply 1 Application topically 2 (two) times daily. Sandi Crosby, PA-C Taking Active             Home Care and Equipment/Supplies: Were Home Health Services Ordered?: NA Any new equipment or medical supplies ordered?: NA  Functional Questionnaire: Do you need assistance with bathing/showering or dressing?: No Do you need assistance with meal preparation?: No Do you need assistance with eating?: No Do you have difficulty maintaining continence: No Do you need assistance with getting out of  bed/getting out of a chair/moving?: No Do you have difficulty managing or taking your medications?: No  Follow up appointments reviewed: PCP Follow-up appointment confirmed?: Yes Date of PCP follow-up appointment?: 11/10/23 Follow-up Provider: Gavin Kast NP [facility] Follow-up appointment confirmed?: NA Do you need transportation to your follow-up appointment?: No Do you understand care options if your condition(s) worsen?: Yes-patient verbalized understanding    SIGNATURE Kirby Peoples, RMA

## 2023-11-11 ENCOUNTER — Encounter: Payer: Self-pay | Admitting: Internal Medicine

## 2023-11-11 LAB — CBC WITH DIFFERENTIAL/PLATELET
Basophils Absolute: 0 10*3/uL (ref 0.0–0.1)
Basophils Relative: 0.4 % (ref 0.0–3.0)
Eosinophils Absolute: 0.2 10*3/uL (ref 0.0–0.7)
Eosinophils Relative: 1.9 % (ref 0.0–5.0)
HCT: 38.3 % (ref 36.0–46.0)
Hemoglobin: 12.8 g/dL (ref 12.0–15.0)
Lymphocytes Relative: 18.2 % (ref 12.0–46.0)
Lymphs Abs: 1.4 10*3/uL (ref 0.7–4.0)
MCHC: 33.5 g/dL (ref 30.0–36.0)
MCV: 91.9 fl (ref 78.0–100.0)
Monocytes Absolute: 0.4 10*3/uL (ref 0.1–1.0)
Monocytes Relative: 4.6 % (ref 3.0–12.0)
Neutro Abs: 5.9 10*3/uL (ref 1.4–7.7)
Neutrophils Relative %: 74.9 % (ref 43.0–77.0)
Platelets: 332 10*3/uL (ref 150.0–400.0)
RBC: 4.17 Mil/uL (ref 3.87–5.11)
RDW: 13.7 % (ref 11.5–15.5)
WBC: 7.9 10*3/uL (ref 4.0–10.5)

## 2023-11-11 LAB — COMPREHENSIVE METABOLIC PANEL WITH GFR
ALT: 13 U/L (ref 0–35)
AST: 16 U/L (ref 0–37)
Albumin: 4.6 g/dL (ref 3.5–5.2)
Alkaline Phosphatase: 51 U/L (ref 39–117)
BUN: 25 mg/dL — ABNORMAL HIGH (ref 6–23)
CO2: 26 meq/L (ref 19–32)
Calcium: 9.9 mg/dL (ref 8.4–10.5)
Chloride: 102 meq/L (ref 96–112)
Creatinine, Ser: 0.78 mg/dL (ref 0.40–1.20)
GFR: 83.98 mL/min (ref >60.00)
Glucose, Bld: 91 mg/dL (ref 70–99)
Potassium: 4.2 meq/L (ref 3.5–5.1)
Sodium: 137 meq/L (ref 135–145)
Total Bilirubin: 0.4 mg/dL (ref 0.2–1.2)
Total Protein: 7.3 g/dL (ref 6.0–8.3)

## 2023-11-11 LAB — C-REACTIVE PROTEIN: CRP: <1 mg/dL (ref 0.5–20.0)

## 2023-11-11 LAB — SEDIMENTATION RATE: Sed Rate: 2 mm/h (ref 0–30)

## 2023-11-11 LAB — ANA W/REFLEX: Anti Nuclear Antibody (ANA): NEGATIVE

## 2023-11-11 LAB — RHEUMATOID FACTOR: Rheumatoid fact SerPl-aCnc: <10 [IU]/mL (ref <14)

## 2023-11-11 NOTE — Telephone Encounter (Signed)
 Sherri - can you see about getting patient's ultrasound location changed based upon her insurance?   Thanks.

## 2023-11-13 ENCOUNTER — Encounter: Payer: Self-pay | Admitting: Internal Medicine

## 2023-11-13 DIAGNOSIS — R590 Localized enlarged lymph nodes: Secondary | ICD-10-CM

## 2023-12-04 ENCOUNTER — Encounter: Payer: Self-pay | Admitting: Internal Medicine

## 2023-12-04 ENCOUNTER — Other Ambulatory Visit: Payer: Self-pay | Admitting: Internal Medicine

## 2023-12-04 DIAGNOSIS — R21 Rash and other nonspecific skin eruption: Secondary | ICD-10-CM

## 2023-12-04 NOTE — Telephone Encounter (Signed)
 Message sent

## 2023-12-09 ENCOUNTER — Emergency Department (HOSPITAL_COMMUNITY)
Admission: EM | Admit: 2023-12-09 | Discharge: 2023-12-10 | Disposition: A | Discharge status: Home / Self Care | Attending: Emergency Medicine | Admitting: Emergency Medicine

## 2023-12-09 ENCOUNTER — Other Ambulatory Visit: Payer: Self-pay

## 2023-12-09 DIAGNOSIS — R197 Diarrhea, unspecified: Secondary | ICD-10-CM | POA: Diagnosis not present

## 2023-12-09 DIAGNOSIS — R112 Nausea with vomiting, unspecified: Secondary | ICD-10-CM | POA: Insufficient documentation

## 2023-12-09 DIAGNOSIS — R1084 Generalized abdominal pain: Secondary | ICD-10-CM | POA: Diagnosis not present

## 2023-12-09 DIAGNOSIS — R1013 Epigastric pain: Secondary | ICD-10-CM | POA: Insufficient documentation

## 2023-12-09 DIAGNOSIS — R1033 Periumbilical pain: Secondary | ICD-10-CM | POA: Insufficient documentation

## 2023-12-09 LAB — COMPREHENSIVE METABOLIC PANEL WITH GFR
ALT: 27 U/L (ref 0–44)
AST: 31 U/L (ref 15–41)
Albumin: 4.5 g/dL (ref 3.5–5.0)
Alkaline Phosphatase: 60 U/L (ref 38–126)
Anion gap: 10 (ref 5–15)
BUN: 31 mg/dL — ABNORMAL HIGH (ref 6–20)
CO2: 25 mmol/L (ref 22–32)
Calcium: 10.1 mg/dL (ref 8.9–10.3)
Chloride: 103 mmol/L (ref 98–111)
Creatinine, Ser: 0.94 mg/dL (ref 0.44–1.00)
GFR, Estimated: >60 mL/min (ref >60)
Glucose, Bld: 111 mg/dL — ABNORMAL HIGH (ref 70–99)
Potassium: 3.9 mmol/L (ref 3.5–5.1)
Sodium: 138 mmol/L (ref 135–145)
Total Bilirubin: 0.8 mg/dL (ref 0.0–1.2)
Total Protein: 7.8 g/dL (ref 6.5–8.1)

## 2023-12-09 LAB — CBC WITH DIFFERENTIAL/PLATELET
Abs Immature Granulocytes: 0.02 10*3/uL (ref 0.00–0.07)
Basophils Absolute: 0.1 10*3/uL (ref 0.0–0.1)
Basophils Relative: 1 %
Eosinophils Absolute: 0.2 10*3/uL (ref 0.0–0.5)
Eosinophils Relative: 3 %
HCT: 41.8 % (ref 36.0–46.0)
Hemoglobin: 13.7 g/dL (ref 12.0–15.0)
Immature Granulocytes: 0 %
Lymphocytes Relative: 33 %
Lymphs Abs: 2.6 10*3/uL (ref 0.7–4.0)
MCH: 30 pg (ref 26.0–34.0)
MCHC: 32.8 g/dL (ref 30.0–36.0)
MCV: 91.5 fL (ref 80.0–100.0)
Monocytes Absolute: 0.5 10*3/uL (ref 0.1–1.0)
Monocytes Relative: 7 %
Neutro Abs: 4.4 10*3/uL (ref 1.7–7.7)
Neutrophils Relative %: 56 %
Platelets: 347 10*3/uL (ref 150–400)
RBC: 4.57 MIL/uL (ref 3.87–5.11)
RDW: 13 % (ref 11.5–15.5)
WBC: 7.7 10*3/uL (ref 4.0–10.5)
nRBC: 0 % (ref 0.0–0.2)

## 2023-12-09 LAB — LIPASE, BLOOD: Lipase: 63 U/L — ABNORMAL HIGH (ref 11–51)

## 2023-12-09 MED ORDER — ONDANSETRON HCL 4 MG/2ML IJ SOLN
4.0000 mg | Freq: Once | INTRAMUSCULAR | Status: AC
  Administered 2023-12-10: 4 mg via INTRAVENOUS
  Filled 2023-12-09: qty 2

## 2023-12-09 MED ORDER — MORPHINE SULFATE (PF) 4 MG/ML IV SOLN
4.0000 mg | Freq: Once | INTRAVENOUS | Status: AC
  Administered 2023-12-10: 4 mg via INTRAVENOUS
  Filled 2023-12-09: qty 1

## 2023-12-09 MED ORDER — SODIUM CHLORIDE 0.9 % IV BOLUS
1000.0000 mL | Freq: Once | INTRAVENOUS | Status: AC
  Administered 2023-12-10: 1000 mL via INTRAVENOUS

## 2023-12-09 NOTE — ED Provider Notes (Signed)
 [place] EMERGENCY DEPARTMENT AT [facility] Provider Note   CSN: [id number] Arrival date & time: 12/09/23  2208    History  Chief Complaint  Patient presents with   Abdominal Pain    Anne Lewis is a 58 y.o. female here for evaluation nausea, vomiting, diarrhea and abdominal pain which began around 6 PM after eating [employer].  No other sick contacts.  She has diffuse upper abdominal pain.  Does not radiate.  Did recently travel to [place] days ago.  No chest pain, shortness of breath, lower extremity edema or pain.  Denies any concern for suspicious food intake on her travels.  She had 2 episodes of loose stool without any blood.  Multiple episodes of NBNB emesis.  Has never had anything like this previously. No meds PTA.  No chronic NSAID use, EtOH use, no history of GI bleeds, PUD.  Prior appendectomy. Still had gallbladder.  HPI     Home Medications Prior to Admission medications   Medication Sig Start Date End Date Taking? Authorizing Provider  dicyclomine  (BENTYL ) 20 MG tablet Take 1 tablet (20 mg total) by mouth 2 (two) times daily. 12/10/23  Yes Taevin Mcferran A, PA-C  famotidine (PEPCID) 20 MG tablet Take 1 tablet (20 mg total) by mouth 2 (two) times daily. 12/10/23  Yes Pierre Cumpton A, PA-C  ondansetron  (ZOFRAN -ODT) 4 MG disintegrating tablet Take 1 tablet (4 mg total) by mouth every 8 (eight) hours as needed. 12/10/23  Yes Jasaun Carn A, PA-C  triamcinolone  ointment (KENALOG ) 0.5 % Apply 1 Application topically 2 (two) times daily. 11/05/23   Sofia, Leslie K, PA-C      Allergies    Aspirin, Augmentin [amoxicillin -pot clavulanate], and Oxycodone     Review of Systems   Review of Systems  Constitutional: Negative.   HENT: Negative.    Respiratory: Negative.    Cardiovascular: Negative.   Gastrointestinal:  Positive for abdominal pain, diarrhea, nausea and vomiting. Negative for abdominal distention, anal bleeding, blood in stool,  constipation and rectal pain.  Genitourinary: Negative.   Musculoskeletal: Negative.   Skin: Negative.   Neurological: Negative.   All other systems reviewed and are negative.  Physical Exam Updated Vital Signs BP 108/71   Pulse 72   Temp 98 F (36.7 C) (Oral)   Resp 16   LMP 03/14/2018   SpO2 99%  Physical Exam Vitals and nursing note reviewed.  Constitutional:      General: She is not in acute distress.    Appearance: She is well-developed. She is not ill-appearing, toxic-appearing or diaphoretic.  HENT:     Head: Atraumatic.  Eyes:     Pupils: Pupils are equal, round, and reactive to light.  Cardiovascular:     Rate and Rhythm: Normal rate.     Pulses: Normal pulses.          Radial pulses are 2+ on the right side and 2+ on the left side.       Dorsalis pedis pulses are 2+ on the right side and 2+ on the left side.     Heart sounds: Normal heart sounds.  Pulmonary:     Effort: Pulmonary effort is normal. No respiratory distress.     Breath sounds: Normal breath sounds.  Abdominal:     General: Bowel sounds are normal. There is no distension.     Tenderness: There is generalized abdominal tenderness and tenderness in the epigastric area and periumbilical area. There is no right CVA  tenderness, left CVA tenderness, guarding or rebound. Negative signs include Murphy's sign and McBurney's sign.     Hernia: No hernia is present.     Comments: Tenderness to epigastric, periumbilical region. No rebound or guarding  Musculoskeletal:        General: Normal range of motion.     Cervical back: Normal range of motion.     Comments: No bony tenderness, compartments soft, calves soft, nontender, full range of motion  Skin:    General: Skin is warm and dry.  Neurological:     General: No focal deficit present.     Mental Status: She is alert.  Psychiatric:        Mood and Affect: Mood normal.    ED Results / Procedures / Treatments   Labs (all labs ordered are listed, but  only abnormal results are displayed) Labs Reviewed  COMPREHENSIVE METABOLIC PANEL WITH GFR - Abnormal; Notable for the following components:      Result Value   Glucose, Bld 111 (*)    BUN 31 (*)    All other components within normal limits  LIPASE, BLOOD - Abnormal; Notable for the following components:   Lipase 63 (*)    All other components within normal limits  URINALYSIS, ROUTINE W REFLEX MICROSCOPIC - Abnormal; Notable for the following components:   APPearance CLOUDY (*)    Leukocytes,Ua TRACE (*)    All other components within normal limits  CBC WITH DIFFERENTIAL/PLATELET    EKG None  Radiology CT ABDOMEN PELVIS W CONTRAST Result Date: 12/10/2023 CLINICAL DATA:  Epigastric EXAM: CT ABDOMEN AND PELVIS WITH CONTRAST TECHNIQUE: Multidetector CT imaging of the abdomen and pelvis was performed using the standard protocol following bolus administration of intravenous contrast. RADIATION DOSE REDUCTION: This exam was performed according to the departmental dose-optimization program which includes automated exposure control, adjustment of the mA and/or kV according to patient size and/or use of iterative reconstruction technique. CONTRAST:  OMNIPAQUE  IOHEXOL  300 MG/ML  SOLN COMPARISON:  07/25/2023 FINDINGS: Lower chest: No acute abnormality Hepatobiliary: No focal hepatic abnormality. Gallbladder unremarkable. Pancreas: No focal abnormality or ductal dilatation. Spleen: No focal abnormality.  Normal size. Adrenals/Urinary Tract: No adrenal abnormality. No focal renal abnormality. No stones or hydronephrosis. Urinary bladder is unremarkable. Stomach/Bowel: Stomach and small bowel decompressed, unremarkable. No bowel obstruction or inflammatory process. Vascular/Lymphatic: Aortic atherosclerosis. No evidence of aneurysm or adenopathy. Reproductive: Uterus and adnexa unremarkable.  No mass. Other: No free fluid or free air. Musculoskeletal: No acute bony abnormality. IMPRESSION: No acute  findings in the abdomen or pelvis. Electronically Signed   By: Janeece Mechanic M.D.   On: 12/10/2023 01:24    Procedures Procedures    Medications Ordered in ED Medications  sodium chloride  0.9 % bolus 1,000 mL (1,000 mLs Intravenous Bolus 12/10/23 0019)  ondansetron  (ZOFRAN ) injection 4 mg (4 mg Intravenous Given 12/10/23 0018)  morphine (PF) 4 MG/ML injection 4 mg (4 mg Intravenous Given 12/10/23 0018)  iohexol  (OMNIPAQUE ) 300 MG/ML solution 100 mL (100 mLs Intravenous Contrast Given 12/10/23 0050)  dicyclomine  (BENTYL ) capsule 10 mg (10 mg Oral Given 12/10/23 0252)   ED Course/ Medical Decision Making/ A&P   58 year old here for evaluation nausea vomiting diarrhea and abdominal pain which began after eating at a restaurant today.  He recently returned from [place] however no symptoms until after she ate today.  She has some generalized upper abdominal and periumbilical pain.  Multiple episodes of NBNB emesis and loose stool without any blood.  No recent antibiotics.  No chronic EtOH use, NSAID use.  She has had a prior appendectomy.  Will plan on labs, imaging and reassess will get stool sample if she is able to provide a  Labs and imaging personally viewed and interpreted:  UA negative for infection Lipase 63--history of pancreatitis, EtOH use, hypercholesterolemia, insect bites CBC without significant abnormality Metabolic panel without significant abnormality CT abdomen pelvis without acute abnormality  Patient reassessed.  Symptoms improved.  Will p.o. challenge.  On repeat exam patient does not have a surgical abdomin and there are no peritoneal signs.  No indication of appendicitis, bowel obstruction, bowel perforation, cholecystitis, diverticulitis, PID, intermittent/persistent torsion, ectopic pregnancy, intrathoracic etiology such as pneumonia, pneumothorax, PE.  Was not able to provide stool sample here in the ED. Tolerating PO intake at dc, pain controlled.  The patient has  been appropriately medically screened and/or stabilized in the ED. I have low suspicion for any other emergent medical condition which would require further screening, evaluation or treatment in the ED or require inpatient management.  Patient is hemodynamically stable and in no acute distress.  Patient able to ambulate in department prior to ED.  Evaluation does not show acute pathology that would require ongoing or additional emergent interventions while in the emergency department or further inpatient treatment.  I have discussed the diagnosis with the patient and answered all questions.  Pain is been managed while in the emergency department and patient has no further complaints prior to discharge.  Patient is comfortable with plan discussed in room and is stable for discharge at this time.  I have discussed strict return precautions for returning to the emergency department.  Patient was encouraged to follow-up with PCP/specialist refer to at discharge.                                 Medical Decision Making Amount and/or Complexity of Data Reviewed Independent Historian: spouse External Data Reviewed: labs, radiology and notes. Labs: ordered. Decision-making details documented in ED Course. Radiology: ordered and independent interpretation performed. Decision-making details documented in ED Course.  Risk OTC drugs. Prescription drug management. Parenteral controlled substances. Decision regarding hospitalization.         Final Clinical Impression(s) / ED Diagnoses Final diagnoses:  Epigastric pain  Nausea vomiting and diarrhea    Rx / DC Orders ED Discharge Orders          Ordered    dicyclomine  (BENTYL ) 20 MG tablet  2 times daily        12/10/23 0319    ondansetron  (ZOFRAN -ODT) 4 MG disintegrating tablet  Every 8 hours PRN        12/10/23 0319    famotidine (PEPCID) 20 MG tablet  2 times daily        12/10/23 0324              Aaban Griep A,  PA-C 12/10/23 0324    Eldon Greenland, MD 12/10/23 [phone]

## 2023-12-09 NOTE — ED Triage Notes (Addendum)
 Patient c/o abdominal pain with nausea and vomiting since about 1800 after eating [employer] chicken( it didn't taste right it tasted like gas) green beans and pot roast. As she was walking out the restaurant her stomach started hurting. Pain 10/10 abdominal. Recent travel to [place] returned on 5/18

## 2023-12-10 ENCOUNTER — Ambulatory Visit: Admitting: Registered"

## 2023-12-10 ENCOUNTER — Emergency Department (HOSPITAL_COMMUNITY)

## 2023-12-10 LAB — URINALYSIS, ROUTINE W REFLEX MICROSCOPIC
Bacteria, UA: NONE SEEN
Bilirubin Urine: NEGATIVE
Glucose, UA: NEGATIVE mg/dL
Hgb urine dipstick: NEGATIVE
Ketones, ur: NEGATIVE mg/dL
Nitrite: NEGATIVE
Protein, ur: NEGATIVE mg/dL
Specific Gravity, Urine: 1.02 (ref 1.005–1.030)
pH: 8 (ref 5.0–8.0)

## 2023-12-10 MED ORDER — IOHEXOL 300 MG/ML  SOLN
100.0000 mL | Freq: Once | INTRAMUSCULAR | Status: AC | PRN
  Administered 2023-12-10: 100 mL via INTRAVENOUS

## 2023-12-10 MED ORDER — DICYCLOMINE HCL 10 MG PO CAPS
10.0000 mg | ORAL_CAPSULE | Freq: Once | ORAL | Status: AC
  Administered 2023-12-10: 10 mg via ORAL
  Filled 2023-12-10: qty 1

## 2023-12-10 MED ORDER — DICYCLOMINE HCL 20 MG PO TABS: 20.0000 mg | ORAL_TABLET | Freq: Two times a day (BID) | ORAL | 0 refills | Status: DC

## 2023-12-10 MED ORDER — ONDANSETRON 4 MG PO TBDP: 4.0000 mg | ORAL_TABLET | Freq: Three times a day (TID) | ORAL | 0 refills | Status: DC | PRN

## 2023-12-10 MED ORDER — FAMOTIDINE 20 MG PO TABS: 20.0000 mg | ORAL_TABLET | Freq: Two times a day (BID) | ORAL | 0 refills | Status: DC

## 2023-12-10 NOTE — Discharge Instructions (Addendum)
 It was a pleasure taking care of you here today  Your workup today was reassuring  I written you for some medications to help with your symptoms Zofran -this medication for nausea and vomiting Bentyl -this medication for abdominal pain Pepcid- to coat your stomach  Bland diet over the next few days, gradually increase  Make sure to follow-up outpatient, return for any worsening symptoms

## 2023-12-10 NOTE — ED Notes (Signed)
 Pt able to tolerate po challenge with water and saltine crackers w/out any difficulties.

## 2023-12-25 ENCOUNTER — Ambulatory Visit: Admitting: Nurse Practitioner

## 2024-01-01 ENCOUNTER — Other Ambulatory Visit: Payer: Self-pay | Admitting: Internal Medicine

## 2024-01-01 DIAGNOSIS — R590 Localized enlarged lymph nodes: Secondary | ICD-10-CM

## 2024-01-02 ENCOUNTER — Ambulatory Visit (HOSPITAL_COMMUNITY): Admission: RE | Admit: 2024-01-02 | Source: Ambulatory Visit

## 2024-01-08 ENCOUNTER — Ambulatory Visit: Admitting: Nurse Practitioner

## 2024-01-27 NOTE — Telephone Encounter (Signed)
 See 12/04/23 TE

## 2024-02-04 ENCOUNTER — Ambulatory Visit: Admitting: Physician Assistant

## 2024-02-04 ENCOUNTER — Encounter: Payer: Self-pay | Admitting: Physician Assistant

## 2024-02-04 VITALS — BP 103/73

## 2024-02-04 DIAGNOSIS — L814 Other melanin hyperpigmentation: Secondary | ICD-10-CM | POA: Diagnosis not present

## 2024-02-04 DIAGNOSIS — L649 Androgenic alopecia, unspecified: Secondary | ICD-10-CM

## 2024-02-04 DIAGNOSIS — L259 Unspecified contact dermatitis, unspecified cause: Secondary | ICD-10-CM | POA: Diagnosis not present

## 2024-02-04 DIAGNOSIS — W908XXA Exposure to other nonionizing radiation, initial encounter: Secondary | ICD-10-CM | POA: Diagnosis not present

## 2024-02-04 MED ORDER — CLOBETASOL PROPIONATE 0.05 % EX OINT: 1.0000 | TOPICAL_OINTMENT | Freq: Two times a day (BID) | CUTANEOUS | 1 refills | Status: DC

## 2024-02-04 NOTE — Patient Instructions (Signed)

## 2024-02-04 NOTE — Progress Notes (Signed)
   New Patient Visit   Subjective  Anne Lewis is a 58 y.o. female who presents for the following: Rash of back that comes and goes. It was painful and has bled. It started in May and it is starting to come back now. Has photos on her phone. She went to the ED and they gave her hydrocortisone cream but it burned. She also has a brown spot by her right eye she would like checked. Also wishes to discuss hair loss.     The following portions of the chart were reviewed this encounter and updated as appropriate: medications, allergies, medical history  Review of Systems:  No other skin or systemic complaints except as noted in HPI or Assessment and Plan.  Objective  Well appearing patient in no apparent distress; mood and affect are within normal limits.   A focused examination was performed of the following areas: Face, scalp, back   Relevant exam findings are noted in the Assessment and Plan.    Assessment & Plan   CONTACT DERMATITIS Exam: Pink patches of lower back  Treatment Plan: Clobetasol  ointment apply to affected area of back twice daily until clear  LENTIGINES Exam: scattered tan macules of face Due to sun exposure Treatment Plan: Benign-appearing, observe. Recommend daily broad spectrum sunscreen SPF 30+ to sun-exposed areas, reapply every 2 hours as needed.  Call for any changes  ANDROGENETIC ALOPECIA (FEMALE PATTERN HAIR LOSS) Exam: Diffuse thinning of the crown and widening of the midline part with retention of the frontal hairline  Female Androgenic Alopecia is a chronic condition related to genetics and/or hormonal changes.  In women androgenetic alopecia is commonly associated with menopause but may occur any time after puberty.  It causes hair thinning primarily on the crown with widening of the part and temporal hairline recession.  Can use OTC Rogaine (minoxidil) 5% solution/foam as directed.  Oral treatments in female patients who have no  contraindication may include : - Low dose oral minoxidil 1.25 - 5mg  daily - Spironolactone 50 - 100mg  bid - Finasteride 2.5 - 5 mg daily Adjunctive therapies include: - Low Level Laser Light Therapy (LLLT) - Platelet-rich plasma injections (PRP) - Hair Transplants or scalp reduction   Treatment Plan: Recommend OTC Minoxidil   CONTACT DERMATITIS, UNSPECIFIED CONTACT DERMATITIS TYPE, UNSPECIFIED TRIGGER   LENTIGINES   ANDROGENIC ALOPECIA    Return if symptoms worsen or fail to improve.  I, Roseline Hutchinson, CMA, am acting as scribe for Kerria Sapien K, PA-C .   Documentation: I have reviewed the above documentation for accuracy and completeness, and I agree with the above.  Marcell Chavarin K, PA-C

## 2024-02-05 ENCOUNTER — Ambulatory Visit (INDEPENDENT_AMBULATORY_CARE_PROVIDER_SITE_OTHER): Admitting: Nurse Practitioner

## 2024-02-05 ENCOUNTER — Encounter: Payer: Self-pay | Admitting: Nurse Practitioner

## 2024-02-05 VITALS — BP 118/76 | HR 69 | Ht 67.5 in | Wt 123.6 lb

## 2024-02-05 DIAGNOSIS — Z01419 Encounter for gynecological examination (general) (routine) without abnormal findings: Secondary | ICD-10-CM | POA: Diagnosis not present

## 2024-02-05 DIAGNOSIS — R591 Generalized enlarged lymph nodes: Secondary | ICD-10-CM | POA: Diagnosis not present

## 2024-02-05 DIAGNOSIS — Z1331 Encounter for screening for depression: Secondary | ICD-10-CM | POA: Diagnosis not present

## 2024-02-05 DIAGNOSIS — Z780 Asymptomatic menopausal state: Secondary | ICD-10-CM

## 2024-02-05 NOTE — Patient Instructions (Signed)
 EXERCISE   We recommended that you start or continue a regular exercise program for good health. Physical activity is anything that gets your body moving, some is better than none. The CDC recommends 150 minutes per week of Moderate-Intensity Aerobic Activity and 2 or more days of Muscle Strengthening Activity.   Benefits of exercise are limitless: helps weight loss/weight maintenance, improves mood and energy, helps with depression and anxiety, improves sleep, tones and strengthens muscles, improves balance, improves bone density, protects from chronic conditions such as heart disease, high blood pressure and diabetes and so much more. To learn more visit: http://kirby-bean.org/   DIET: Good nutrition starts with a healthy diet of fruits, vegetables, whole grains, and lean protein sources. Drink plenty of water for hydration. Minimize empty calories, sodium, sweets. For more information about dietary recommendations visit: CriticalGas.be and https://www.carpenter-henry.info/   ALCOHOL:  Women should limit their alcohol intake to no more than 7 drinks/beers/glasses of wine (combined, not each!) per week. Moderation of alcohol intake to this level decreases your risk of breast cancer and liver damage.  If you are concerned that you may have a problem, or your friends have told you they are concerned about your drinking, there are many resources to help. A well-known program that is free, effective, and available to all people all over the nation is Alcoholics Anonymous.  Check out this site to learn more: BeverageBargains.co.za   CALCIUM AND VITAMIN D :  Adequate intake of calcium and Vitamin D  are recommended for bone health.  You should be getting between 1000-1200 mg of calcium and 800 units of Vitamin D  daily between diet and supplements   MAMMOGRAMS:  All women over 16 years old should have a routine mammogram.    COLON CANCER  SCREENING: Now recommend starting at age 3. At this time colonoscopy is not covered for routine screening until 50. There are take home tests that can be done between 45-49.    COLONOSCOPY:  Colonoscopy to screen for colon cancer is recommended for all women at age 63.  We know, you hate the idea of the prep.  We agree, BUT, having colon cancer and not knowing it is worse!!  Colon cancer so often starts as a polyp that can be seen and removed at colonscopy, which can quite literally save your life!  And if your first colonoscopy is normal and you have no family history of colon cancer, most women don't have to have it again for 10 years.  Once every ten years, you can do something that may end up saving your life, right?  We will be happy to help you get it scheduled when you are ready.  Be sure to check your insurance coverage so you understand how much it will cost.  It may be covered as a preventative service at no cost, but you should check your particular policy.

## 2024-02-05 NOTE — Progress Notes (Signed)
 Anne Lewis 12-Apr-1966 [id number]   History:  58 y.o. G3P3003 presents for annual exam. Has had swollen lymph nodes in groin area and under right arm since May. Had rash on back when lymph nodes first appeared. They have decreased in size since then. Has seen PCP for this. Planning to do CT scan but having issues getting covered by insurance. Neg ANA, CRP, sed rate, CMP, CBC, RA factor. Postmenopausal - no HRT, no bleeding. H/O DM, migraines. MGM (54 yo), mother (61 yo), sister (15), maternal cousin (23 yo) with history of ovarian cancer, they have not done genetic test but patient is BRCA negative. Smoker. Struggling with weight loss. Feels she eats well, increased protein intake. BMI 19.  Gynecologic History Patient's last menstrual period was 03/14/2018.   Contraception/Family planning: post menopausal status Sexually active: No  Health Maintenance Last Pap: 08/27/2022. Results were: Normal neg HPV Last mammogram: 09/10/2022. Results were: Left breast asymmetry, normal follow up imaging Last colonoscopy: 12/03/2022. Results were: Adenomas, 5-year recall Last Dexa: Not indicated     02/05/2024    3:54 PM  Depression screen PHQ 2/9  Decreased Interest 0  Down, Depressed, Hopeless 0  PHQ - 2 Score 0     Past medical history, past surgical history, family history and social history were all reviewed and documented in the EPIC chart. Separated. Emergency planning/management officer. 3 children ages. 3 grandchildren.   ROS:  A ROS was performed and pertinent positives and negatives are included.  Exam:  Vitals:   02/05/24 1554  BP: 118/76  Pulse: 69  SpO2: 98%  Weight: 123 lb 9.6 oz (56.1 kg)  Height: 5' 7.5 (1.715 m)    Body mass index is 19.07 kg/m.  General appearance:  Normal Thyroid :  Symmetrical, normal in size, without palpable masses or nodularity. Respiratory  Auscultation:  Clear without wheezing or rhonchi Cardiovascular  Auscultation:  Regular rate, without rubs, murmurs or  gallops  Edema/varicosities:  Not grossly evident Lymph  Enlarged lymph node in right axillary, as well as cervical, tonsillar, and mildly enlarged in bilateral groin Abdominal  Soft,nontender, without masses, guarding or rebound.  Liver/spleen:  No organomegaly noted  Hernia:  None appreciated  Skin  Inspection:  Grossly normal Breasts: Examined lying and sitting.   Right: Without masses, retractions, nipple discharge or axillary adenopathy.   Left: Without masses, retractions, nipple discharge or axillary adenopathy. Pelvic: External genitalia:  no lesions              Urethra:  normal appearing urethra with no masses, tenderness or lesions              Bartholins and Skenes: normal                 Vagina: normal appearing vagina with normal color and discharge, no lesions              Cervix: no lesions Bimanual Exam:  Uterus:  no masses or tenderness              Adnexa: no mass, fullness, tenderness              Rectovaginal: Deferred              Anus:  normal, no lesions  Anne Lewis, CMA present as chaperone.   Assessment/Plan:  58 y.o. G3P3003 for annual exam.   Well female exam with routine gynecological exam - Education provided on SBEs, importance of preventative screenings, current guidelines, high calcium diet,  regular exercise, and multivitamin daily.  Labs done elsewhere.   Postmenopausal - no HRT, no bleeding  Lymphadenopathy - being evaluated by PCP, some decrease in size.   Screening for cervical cancer - Normal Pap history.  Will repeat at 5-year interval per guidelines.  Screening for breast cancer - Normal mammogram history. Overdue and encouraged to schedule. Normal breast exam today.  Screening for colon cancer - 11/2022 colonoscopy. Will repeat at 5-year interval per GI recommendation.   Return in about 1 year (around 02/04/2025) for Annual.    Anne DELENA Shutter DNP, 4:34 PM 02/05/2024

## 2024-02-11 ENCOUNTER — Other Ambulatory Visit: Payer: Self-pay

## 2024-02-11 ENCOUNTER — Emergency Department (HOSPITAL_COMMUNITY)

## 2024-02-11 ENCOUNTER — Ambulatory Visit: Payer: Self-pay

## 2024-02-11 ENCOUNTER — Encounter (HOSPITAL_COMMUNITY): Payer: Self-pay

## 2024-02-11 ENCOUNTER — Emergency Department (HOSPITAL_COMMUNITY)
Admission: EM | Admit: 2024-02-11 | Discharge: 2024-02-12 | Disposition: A | Discharge status: Home / Self Care | Source: Ambulatory Visit | Attending: Emergency Medicine | Admitting: Emergency Medicine

## 2024-02-11 DIAGNOSIS — R42 Dizziness and giddiness: Secondary | ICD-10-CM | POA: Insufficient documentation

## 2024-02-11 DIAGNOSIS — R112 Nausea with vomiting, unspecified: Secondary | ICD-10-CM | POA: Diagnosis not present

## 2024-02-11 DIAGNOSIS — M542 Cervicalgia: Secondary | ICD-10-CM | POA: Diagnosis present

## 2024-02-11 DIAGNOSIS — R519 Headache, unspecified: Secondary | ICD-10-CM | POA: Insufficient documentation

## 2024-02-11 LAB — BASIC METABOLIC PANEL WITH GFR
Anion gap: 10 (ref 5–15)
BUN: 24 mg/dL — ABNORMAL HIGH (ref 6–20)
CO2: 28 mmol/L (ref 22–32)
Calcium: 10.1 mg/dL (ref 8.9–10.3)
Chloride: 102 mmol/L (ref 98–111)
Creatinine, Ser: 0.87 mg/dL (ref 0.44–1.00)
GFR, Estimated: >60 mL/min (ref >60)
Glucose, Bld: 130 mg/dL — ABNORMAL HIGH (ref 70–99)
Potassium: 4.3 mmol/L (ref 3.5–5.1)
Sodium: 140 mmol/L (ref 135–145)

## 2024-02-11 LAB — I-STAT CHEM 8, ED
BUN: 23 mg/dL — ABNORMAL HIGH (ref 6–20)
Calcium, Ion: 1.27 mmol/L (ref 1.15–1.40)
Chloride: 103 mmol/L (ref 98–111)
Creatinine, Ser: 0.9 mg/dL (ref 0.44–1.00)
Glucose, Bld: 121 mg/dL — ABNORMAL HIGH (ref 70–99)
HCT: 42 % (ref 36.0–46.0)
Hemoglobin: 14.3 g/dL (ref 12.0–15.0)
Potassium: 4 mmol/L (ref 3.5–5.1)
Sodium: 139 mmol/L (ref 135–145)
TCO2: 27 mmol/L (ref 22–32)

## 2024-02-11 LAB — CBC WITH DIFFERENTIAL/PLATELET
Abs Immature Granulocytes: 0.02 K/uL (ref 0.00–0.07)
Basophils Absolute: 0.1 K/uL (ref 0.0–0.1)
Basophils Relative: 1 %
Eosinophils Absolute: 0.2 K/uL (ref 0.0–0.5)
Eosinophils Relative: 4 %
HCT: 40.7 % (ref 36.0–46.0)
Hemoglobin: 13.4 g/dL (ref 12.0–15.0)
Immature Granulocytes: 0 %
Lymphocytes Relative: 46 %
Lymphs Abs: 2.9 K/uL (ref 0.7–4.0)
MCH: 30.6 pg (ref 26.0–34.0)
MCHC: 32.9 g/dL (ref 30.0–36.0)
MCV: 92.9 fL (ref 80.0–100.0)
Monocytes Absolute: 0.5 K/uL (ref 0.1–1.0)
Monocytes Relative: 8 %
Neutro Abs: 2.6 K/uL (ref 1.7–7.7)
Neutrophils Relative %: 41 %
Platelets: 337 K/uL (ref 150–400)
RBC: 4.38 MIL/uL (ref 3.87–5.11)
RDW: 13.1 % (ref 11.5–15.5)
WBC: 6.3 K/uL (ref 4.0–10.5)
nRBC: 0 % (ref 0.0–0.2)

## 2024-02-11 MED ORDER — DIAZEPAM 5 MG/ML IJ SOLN
2.5000 mg | Freq: Once | INTRAMUSCULAR | Status: AC
Start: 2024-02-11 — End: 2024-02-11
  Administered 2024-02-11: 2.5 mg via INTRAVENOUS
  Filled 2024-02-11: qty 2

## 2024-02-11 MED ORDER — SODIUM CHLORIDE 0.9 % IV BOLUS
500.0000 mL | Freq: Once | INTRAVENOUS | Status: AC
  Administered 2024-02-11: 500 mL via INTRAVENOUS

## 2024-02-11 MED ORDER — PROCHLORPERAZINE EDISYLATE 10 MG/2ML IJ SOLN
5.0000 mg | Freq: Once | INTRAMUSCULAR | Status: AC
  Administered 2024-02-11: 5 mg via INTRAVENOUS
  Filled 2024-02-11: qty 2

## 2024-02-11 MED ORDER — IOHEXOL 350 MG/ML SOLN
75.0000 mL | Freq: Once | INTRAVENOUS | Status: AC | PRN
  Administered 2024-02-11: 75 mL via INTRAVENOUS

## 2024-02-11 MED ORDER — KETOROLAC TROMETHAMINE 30 MG/ML IJ SOLN
15.0000 mg | Freq: Once | INTRAMUSCULAR | Status: AC
  Administered 2024-02-11: 15 mg via INTRAVENOUS
  Filled 2024-02-11: qty 1

## 2024-02-11 MED ORDER — DEXAMETHASONE SODIUM PHOSPHATE 10 MG/ML IJ SOLN
10.0000 mg | Freq: Once | INTRAMUSCULAR | Status: AC
  Administered 2024-02-12: 10 mg via INTRAVENOUS
  Filled 2024-02-11: qty 1

## 2024-02-11 NOTE — ED Provider Notes (Signed)
 [place] EMERGENCY DEPARTMENT AT [facility] Provider Note   CSN: [id number] Arrival date & time: 02/11/24  1529     Patient presents with: Neck Pain   Anne Lewis is a 58 y.o. female with past medical history of migraine headaches, GERD, aortic atherosclerosis, reactive airway who presents emergency department with chief complaint of head and neck pain.  Patient states that 2 weeks ago she injured her head when she hit it against a wall.  She felt a pop in the left side of her neck.  Since that time she has had persistent left-sided neck pain, persistent headache unrelieved with medications at home, intermittent episodes of dizziness and vomiting.  She states that the pain is now all along the left side of her neck left shoulder blade and left anterior chest wall.  Her headache is throbbing.  She denies vision changes but does have pain when she swallows on that side.  She reports that  {Add pertinent medical, surgical, social history, OB history to HPI:32947}  Neck Pain      Prior to Admission medications   Medication Sig Start Date End Date Taking? Authorizing Provider  clobetasol  ointment (TEMOVATE ) 0.05 % Apply 1 Application topically 2 (two) times daily. 02/04/24   Sandridge, Brenda K, PA-C  dicyclomine  (BENTYL ) 20 MG tablet Take 1 tablet (20 mg total) by mouth 2 (two) times daily. 12/10/23   Henderly, Britni A, PA-C  famotidine  (PEPCID ) 20 MG tablet Take 1 tablet (20 mg total) by mouth 2 (two) times daily. 12/10/23   Henderly, Britni A, PA-C  ondansetron  (ZOFRAN -ODT) 4 MG disintegrating tablet Take 1 tablet (4 mg total) by mouth every 8 (eight) hours as needed. 12/10/23   Henderly, Britni A, PA-C  triamcinolone  ointment (KENALOG ) 0.5 % Apply 1 Application topically 2 (two) times daily. 11/05/23   Sofia, Leslie K, PA-C    Allergies: Aspirin, Augmentin [amoxicillin -pot clavulanate], and Oxycodone     Review of Systems  Musculoskeletal:  Positive for neck pain.     Updated Vital Signs BP 138/78 (BP Location: Left Arm)   Pulse 71   Temp 98.3 F (36.8 C) (Oral)   Resp 18   LMP 03/14/2018   SpO2 100%   Physical Exam Physical Exam  Constitutional: Pt is oriented to person, place, and time. Pt appears well-developed and well-nourished. No distress.  HENT:  Head: Normocephalic and atraumatic.  Mouth/Throat: Oropharynx is clear and moist.  Eyes: Conjunctivae and EOM are normal. Pupils are equal, round, and reactive to light. No scleral icterus.  No horizontal, vertical or rotational nystagmus  Neck: Range of motion is limited due to tenderness and spasm on the left side of the neck. No midline or paraspinal tenderness No nuchal rigidity or meningeal signs  Cardiovascular: Normal rate, regular rhythm and intact distal pulses.   Pulmonary/Chest: Effort normal and breath sounds normal. No respiratory distress. Pt has no wheezes. No rales.  Abdominal: Soft. Bowel sounds are normal. There is no tenderness. There is no rebound and no guarding.  Musculoskeletal: Normal range of motion.  Lymphadenopathy:    No cervical adenopathy.  Neurological: Pt. is alert and oriented to person, place, and time. He has normal reflexes. No cranial nerve deficit.  Exhibits normal muscle tone. Coordination normal.  Speech is clear and goal oriented, follows commands Major Cranial nerves without deficit, no facial droop Normal strength in upper and lower extremities bilaterally including dorsiflexion and plantar flexion, strong and equal grip strength Sensation normal to light and sharp  touch Moves extremities without ataxia, coordination intact Normal finger to nose and rapid alternating movements Neg romberg, no pronator drift Normal gait Normal heel-shin and balance Skin: Skin is warm and dry. No rash noted. Pt is not diaphoretic.  Psychiatric: Pt has a normal mood and affect. Behavior is normal. Judgment and thought content normal.  Nursing note and vitals  reviewed.  (all labs ordered are listed, but only abnormal results are displayed) Labs Reviewed  BASIC METABOLIC PANEL WITH GFR - Abnormal; Notable for the following components:      Result Value   Glucose, Bld 130 (*)    BUN 24 (*)    All other components within normal limits  I-STAT CHEM 8, ED - Abnormal; Notable for the following components:   BUN 23 (*)    Glucose, Bld 121 (*)    All other components within normal limits  CBC WITH DIFFERENTIAL/PLATELET    EKG: None  Radiology: No results found.  {Document cardiac monitor, telemetry assessment procedure when appropriate:32947} Procedures   Medications Ordered in the ED  prochlorperazine  (COMPAZINE ) injection 5 mg (has no administration in time range)  diazepam  (VALIUM ) injection 2.5 mg (has no administration in time range)  ketorolac  (TORADOL ) 30 MG/ML injection 15 mg (has no administration in time range)  sodium chloride  0.9 % bolus 500 mL (has no administration in time range)  iohexol  (OMNIPAQUE ) 350 MG/ML injection 75 mL (75 mLs Intravenous Contrast Given 02/11/24 2224)      {Click here for ABCD2, HEART and other calculators REFRESH Note before signing:1}                              Medical Decision Making Amount and/or Complexity of Data Reviewed Labs: ordered. Radiology: ordered.  Risk Prescription drug management.   ***  {Document critical care time when appropriate  Document review of labs and clinical decision tools ie CHADS2VASC2, etc  Document your independent review of radiology images and any outside records  Document your discussion with family members, caretakers and with consultants  Document social determinants of health affecting pt's care  Document your decision making why or why not admission, treatments were needed:32947:::1}   Final diagnoses:  None    ED Discharge Orders     None

## 2024-02-11 NOTE — Discharge Instructions (Signed)
 ### Cervicogenic Headache Discharge     Your recent CT scan showed:[1][2][3]      - **Degenerative changes in the cervical spine** (normal wear and tear of the neck bones and joints)      - **Disc space narrowing and bone spurs (osteophytes)**, especially at the C5-6 and C6-7 levels      - **Facet joint arthritis (facet arthrosis)** at several levels      - **Uncovertebral hypertrophy and facet arthrosis** causing narrowing (stenosis) of the nerve passageways (foramina) at C5-6 on both sides      These findings are common as people age and can sometimes cause neck pain and headaches. However, many people with similar changes on imaging do not have symptoms. The diagnosis of cervicogenic headache is based on your symptoms and physical exam, not just imaging.[1][3]      **What to Expect:**      - Cervicogenic headaches often start in the neck and can spread to the back, side, or front of the head. They may be worsened by certain neck movements or positions.[2]      - You may also have neck stiffness, reduced neck movement, or tenderness in the neck muscles.[4][2]      **Red Flag Symptoms:**      Contact your healthcare provider or go to the emergency room if you develop:      - New or severe weakness, numbness, or tingling in your arms or legs      - Trouble walking, loss of balance, or falls      - Loss of bladder or bowel control      - Severe headache with fever, neck stiffness, or confusion      - Unexplained weight loss, night sweats, or persistent night pain[5]      **Treatment and Self-Care:**      Most people improve with **conservative (non-surgical) treatments**:[5][6][4][7][1][2][8][9]      - **Physical therapy**: Exercises to strengthen neck muscles, improve posture, and increase neck flexibility are recommended. Therapies may include gentle stretching, strengthening, and endurance exercises for the neck and shoulder muscles.[6][4][8]      - **Manual therapy**: Some  people benefit from hands-on treatments such as mobilization or manipulation of the neck, performed by trained professionals.[6][4][8]      - **Pain management**: Over-the-counter pain relievers (such as acetaminophen  or NSAIDs) may help. Use these as directed by your healthcare provider.[5][7]      - **Lifestyle modifications**: Maintain good posture, avoid activities that strain your neck, and take breaks from prolonged sitting or computer use.[6][4][8]      - **Education and self-care**: Learning about your condition and how to manage symptoms is important. Relaxation techniques and stress management may also help.[6][8]      **Other Options:**      - If pain is severe or does not improve, your provider may discuss other treatments such as nerve blocks, injections, or, rarely, surgical options. These are usually reserved for people who do not respond to conservative care or who develop nerve or spinal cord problems.[5][7][9]      **Prognosis:**      - Most people with cervicogenic headache improve over time with conservative treatment. Surgery is rarely needed unless there are significant nerve or spinal cord symptoms.[5]      **Follow-Up:**      - Attend all scheduled appointments with your healthcare provider and physical therapist.      - Report any new or worsening symptoms promptly.      **  Summary:**      Cervicogenic headache is a treatable condition most often managed with physical therapy, pain management, and lifestyle changes. Imaging findings support the diagnosis but are not the sole cause of symptoms. Most people improve without surgery.[5][6][4][7][1][2][3][8][9]

## 2024-02-11 NOTE — ED Triage Notes (Signed)
 Pt reports with left sided neck pain that goes into her shoulder from a fall 2 weeks ago. Pt reports using otc medications and muscle relaxers with no relief.

## 2024-02-11 NOTE — Telephone Encounter (Signed)
 FYI Only or Action Required?: FYI only for provider.  Patient was last seen in primary care on 11/10/2023 by [name], FNP.  Called Nurse Triage reporting Head Injury.  Symptoms began a week ago.  Interventions attempted: Nothing.  Symptoms are: rapidly worsening.  Triage Disposition: Go to ED Now (Notify PCP)  Patient/caregiver understands and will follow disposition?: Yes     Copied from CRM [id number]. Topic: Clinical - Red Word Triage >> Feb 11, 2024 10:51 AM Carlyon D wrote: Red Word that prompted transfer to Nurse Triage:  Pt states she hit her head on a piece of furniture. Pt is in a lot of pain on the left side . Pt is very dizzy, nauseas and pt has thrown up twice today. She feels like something is being pinch left side. Pain gets so bad she feels like she's going to pass out. Reason for Disposition  Vomiting once or more  Answer Assessment - Initial Assessment Questions 1. MECHANISM: How did the injury happen? For falls, ask: What height did you fall from? and What surface did you fall against?      States hit head on a piece of furniture on left side 2. ONSET: When did the injury happen? (e.g., minutes, hours ago)      Last week 3. NEUROLOGIC SYMPTOMS: Was there any loss of consciousness? Are there any other neurological symptoms?      no 4. MENTAL STATUS: Does the person know who they are, who you are, and where they are?      Denies confusion 5. LOCATION: What part of the head was hit?      Left side of head 6. SCALP APPEARANCE: What does the scalp look like? Is it bleeding now? If Yes, ask: Is it difficult to stop?      To ER 7. SIZE: For cuts, bruises, or swelling, ask: How large is it? (e.g., inches or centimeters)      To ER 8. PAIN: Is there any pain? If Yes, ask: How bad is it? (Scale 0-10; or none, mild, moderate, severe)     To ER 9. TETANUS: For any breaks in the skin, ask: When was your last tetanus booster?     To ER 10.  BLOOD THINNERS: Do you take any blood thinners? (e.g., aspirin, clopidogrel / Plavix, coumadin, heparin). Notes: Other strong blood thinners include: Arixtra (fondaparinux), Eliquis (apixaban), Pradaxa (dabigatran), and Xarelto (rivaroxaban).       na 11. OTHER SYMPTOMS: Do you have any other symptoms? (e.g., neck pain, vomiting)       Shakes, vomiting,  12. PREGNANCY: Is there any chance you are pregnant? When was your last menstrual period?       na  Protocols used: Head Injury-A-AH

## 2024-02-24 ENCOUNTER — Ambulatory Visit: Payer: Self-pay

## 2024-02-24 NOTE — Telephone Encounter (Signed)
 FYI Only or Action Required?: FYI only for provider.  Patient was last seen in primary care on 11/10/2023 by [name], FNP.  Called Nurse Triage reporting No chief complaint on file..  Symptoms began about a month ago.  Interventions attempted: Rest, hydration, or home remedies.  Symptoms are: gradually worsening.  Triage Disposition: No disposition on file.  Patient/caregiver understands and will follow disposition?:  Reason for Disposition  [1] Weakness (i.e., paralysis, loss of muscle strength) of the face, arm / hand, or leg / foot on one side of the body AND [2] sudden onset AND [3] brief (now gone)  [1] Numbness (i.e., loss of sensation) of the face, arm / hand, or leg / foot on one side of the body AND [2] sudden onset AND [3] brief (now gone)  Headache  Answer Assessment - Initial Assessment Questions 1. SYMPTOM: What is the main symptom you are concerned about? (e.g., weakness, numbness)     Headache, intermittent left sided weakness and numbness, dizziness, vomiting, pain with neck ROM. Seen in ED for same on 02/11/24  2. ONSET: When did this start? (e.g., minutes, hours, days; while sleeping)     July (see ED visit note)  3. LAST NORMAL: When was the last time you (the patient) were normal (no symptoms)?     July (see ED visit note)  4. PATTERN Does this come and go, or has it been constant since it started?  Is it present now?     Intermittent  5. CARDIAC SYMPTOMS: Have you had any of the following symptoms: chest pain, difficulty breathing, palpitations?     Intermittent SOB, pt speaking in clear and full sentences  Protocols used: Neurologic [facility] Copied from CRM #[id number]. Topic: Clinical - Red Word Triage >> Feb 24, 2024 12:12 PM [name] wrote: [name] that prompted transfer to Nurse Triage: .Can't move neck, can't grab left hand, side of face feels like it's sleep throughout the day, pain in head and neck and shoulder and chest  everything on left side of head. Went to ER 2 weeks for same thing

## 2024-03-16 ENCOUNTER — Encounter: Payer: Self-pay | Admitting: Physician Assistant

## 2024-03-16 ENCOUNTER — Ambulatory Visit (INDEPENDENT_AMBULATORY_CARE_PROVIDER_SITE_OTHER): Admitting: Physician Assistant

## 2024-03-16 VITALS — BP 106/66 | HR 74

## 2024-03-16 DIAGNOSIS — R21 Rash and other nonspecific skin eruption: Secondary | ICD-10-CM | POA: Diagnosis not present

## 2024-03-16 DIAGNOSIS — L308 Other specified dermatitis: Secondary | ICD-10-CM | POA: Diagnosis not present

## 2024-03-16 NOTE — Patient Instructions (Signed)
 Important Information  Due to recent changes in healthcare laws, you may see results of your pathology and/or laboratory studies on MyChart before the doctors have had a chance to review them. We understand that in some cases there may be results that are confusing or concerning to you. Please understand that not all results are received at the same time and often the doctors may need to interpret multiple results in order to provide you with the best plan of care or course of treatment. Therefore, we ask that you please give [place] 2 business days to thoroughly review all your results before contacting the office for clarification. Should we see a critical lab result, you will be contacted sooner.   If You Need Anything After Your Visit  If you have any questions or concerns for your doctor, please call our main line at [phone] If no one answers, please leave a voicemail as directed and we will return your call as soon as possible. Messages left after 4 pm will be answered the following business day.   You may also send [place] a message via MyChart. We typically respond to MyChart messages within 1-2 business days.  For prescription refills, please ask your pharmacy to contact our office. Our fax number is [phone].  If you have an urgent issue when the clinic is closed that cannot wait until the next business day, you can page your doctor at the number below.    Please note that while we do our best to be available for urgent issues outside of office hours, we are not available 24/7.   If you have an urgent issue and are unable to reach [place], you may choose to seek medical care at your doctor's office, retail clinic, urgent care center, or emergency room.  If you have a medical emergency, please immediately call 911 or go to the emergency department. In the event of inclement weather, please call our main line at [phone] for an update on the status of any delays or  closures.  Dermatology Medication Tips: Please keep the boxes that topical medications come in in order to help keep track of the instructions about where and how to use these. Pharmacies typically print the medication instructions only on the boxes and not directly on the medication tubes.   If your medication is too expensive, please contact our office at [phone] or send [place] a message through MyChart.   We are unable to tell what your co-pay for medications will be in advance as this is different depending on your insurance coverage. However, we may be able to find a substitute medication at lower cost or fill out paperwork to get insurance to cover a needed medication.   If a prior authorization is required to get your medication covered by your insurance company, please allow [place] 1-2 business days to complete this process.  Drug prices often vary depending on where the prescription is filled and some pharmacies may offer cheaper prices.  The website www.goodrx.com contains coupons for medications through different pharmacies. The prices here do not account for what the cost may be with help from insurance (it may be cheaper with your insurance), but the website can give you the price if you did not use any insurance.  - You can print the associated coupon and take it with your prescription to the pharmacy.  - You may also stop by our office during regular business hours and pick up a GoodRx coupon card.  - If  you need your prescription sent electronically to a different pharmacy, notify our office through [facility] or by phone at [phone]     Patient Handout: Wound Care for Skin Biopsy Site  Taking Care of Your Skin Biopsy Site  Proper care of the biopsy site is essential for promoting healing and minimizing scarring. This handout provides instructions on how to care for your biopsy site to ensure optimal recovery.  1. Cleaning the Wound:  Clean the biopsy site daily  with gentle soap and water. Gently pat the area dry with a clean, soft towel. Avoid harsh scrubbing or rubbing the area, as this can irritate the skin and delay healing.  2. Applying Aquaphor and Bandage:  After cleaning the wound, apply a thin layer of Aquaphor ointment to the biopsy site. Cover the area with a sterile bandage to protect it from dirt, bacteria, and friction. Change the bandage daily or as needed if it becomes soiled or wet.  3. Continued Care for One Week:  Repeat the cleaning, Aquaphor application, and bandaging process daily for one week following the biopsy procedure. Keeping the wound clean and moist during this initial healing period will help prevent infection and promote optimal healing.  4. Massaging Aquaphor into the Area:  ---After one week, discontinue the use of bandages but continue to apply Aquaphor to the biopsy site. ----Gently massage the Aquaphor into the area using circular motions. ---Massaging the skin helps to promote circulation and prevent the formation of scar tissue.   Additional Tips:  Avoid exposing the biopsy site to direct sunlight during the healing process, as this can cause hyperpigmentation or worsen scarring. If you experience any signs of infection, such as increased redness, swelling, warmth, or drainage from the wound, contact your healthcare provider immediately. Follow any additional instructions provided by your healthcare provider for caring for the biopsy site and managing any discomfort. Conclusion:  Taking proper care of your skin biopsy site is crucial for ensuring optimal healing and minimizing scarring. By following these instructions for cleaning, applying Aquaphor, and massaging the area, you can promote a smooth and successful recovery. If you have any questions or concerns about caring for your biopsy site, don't hesitate to contact your healthcare provider for guidance.

## 2024-03-16 NOTE — Progress Notes (Signed)
   Follow-Up Visit   Subjective  Anne Lewis is a 58 y.o. female EXISTING PATIENT who presents for follow up of rash. The clobetasol  ointment made her condition worse and now it feels like pins and needles. Localized only to her lower back. Sometimes on her arms. Pt mentions she was bit by a tick over a year ago and wonders if she could possibly have lyme disease.    The following portions of the chart were reviewed this encounter and updated as appropriate: medications, allergies, medical history  Review of Systems:  No other skin or systemic complaints except as noted in HPI or Assessment and Plan.  Objective  Well appearing patient in no apparent distress; mood and affect are within normal limits.   A focused examination was performed of the following areas: Back  Relevant exam findings are noted in the Assessment and Plan.  Mid Back Pink scaly papule patches   Assessment & Plan   Rash Exam: Back  Differential diagnosis:  Allergic contact dermatitis  VS GA VS lichen planus vs: fungal  Treatment Plan: Punch biopsy completed in office.  RASH AND OTHER NONSPECIFIC SKIN ERUPTION Mid Back Skin / nail biopsy - Mid Back Type of biopsy: punch   Timeout: patient name, date of birth, surgical site, and procedure verified   Procedure prep:  Patient was prepped and draped in usual sterile fashion Prep type:  Isopropyl alcohol Punch size:  4 mm Hemostasis achieved with: Gelfoam   Outcome: patient tolerated procedure well   Post-procedure details: sterile dressing applied   Dressing type: pressure dressing and petrolatum    Specimen A - Surgical pathology Differential Diagnosis:  allergic contact dermatitis VS GA VS lichen planus vs: fungal VS erythema migrans   Check Margins: No  Return for Biopsy Follow up.  I, Doyce Pan, CMA, am acting as scribe for Lamone Ferrelli K, PA-C.  Documentation: I have reviewed the above documentation for accuracy and  completeness, and I agree with the above.  Wirt Hemmerich K, PA-C

## 2024-03-17 LAB — SURGICAL PATHOLOGY

## 2024-03-18 ENCOUNTER — Encounter: Payer: Self-pay | Admitting: Family

## 2024-03-18 ENCOUNTER — Ambulatory Visit (INDEPENDENT_AMBULATORY_CARE_PROVIDER_SITE_OTHER): Admitting: Family

## 2024-03-18 VITALS — BP 114/68 | HR 83 | Temp 97.6°F | Resp 17 | Ht 67.5 in | Wt 119.4 lb

## 2024-03-18 DIAGNOSIS — K5909 Other constipation: Secondary | ICD-10-CM

## 2024-03-18 DIAGNOSIS — R21 Rash and other nonspecific skin eruption: Secondary | ICD-10-CM

## 2024-03-18 DIAGNOSIS — R112 Nausea with vomiting, unspecified: Secondary | ICD-10-CM

## 2024-03-18 DIAGNOSIS — Z7689 Persons encountering health services in other specified circumstances: Secondary | ICD-10-CM

## 2024-03-18 DIAGNOSIS — Z13220 Encounter for screening for lipoid disorders: Secondary | ICD-10-CM | POA: Diagnosis not present

## 2024-03-18 DIAGNOSIS — R7303 Prediabetes: Secondary | ICD-10-CM | POA: Diagnosis not present

## 2024-03-18 DIAGNOSIS — R5383 Other fatigue: Secondary | ICD-10-CM | POA: Diagnosis not present

## 2024-03-18 DIAGNOSIS — K219 Gastro-esophageal reflux disease without esophagitis: Secondary | ICD-10-CM | POA: Insufficient documentation

## 2024-03-18 DIAGNOSIS — Z8669 Personal history of other diseases of the nervous system and sense organs: Secondary | ICD-10-CM

## 2024-03-18 DIAGNOSIS — R591 Generalized enlarged lymph nodes: Secondary | ICD-10-CM

## 2024-03-18 DIAGNOSIS — L299 Pruritus, unspecified: Secondary | ICD-10-CM

## 2024-03-18 DIAGNOSIS — R634 Abnormal weight loss: Secondary | ICD-10-CM

## 2024-03-18 MED ORDER — HYDROXYZINE HCL 10 MG PO TABS: 10.0000 mg | ORAL_TABLET | Freq: Three times a day (TID) | ORAL | 0 refills | Status: DC | PRN

## 2024-03-18 MED ORDER — FAMOTIDINE 20 MG PO TABS
20.0000 mg | ORAL_TABLET | Freq: Two times a day (BID) | ORAL | 0 refills | Status: DC
Start: 2024-03-18 — End: 2024-04-02

## 2024-03-18 MED ORDER — ONDANSETRON 4 MG PO TBDP: 4.0000 mg | ORAL_TABLET | Freq: Three times a day (TID) | ORAL | 0 refills | Status: DC | PRN

## 2024-03-18 NOTE — Progress Notes (Signed)
 Provider: Roxan Plough FNP-C   Cristy Colmenares, Roxan BROCKS, NP  Patient Care Team: Kaizen Ibsen, Roxan BROCKS, NP as PCP - General (Family Medicine)  Extended Emergency Contact Information Primary Emergency Contact: Oto-Golomb,Grace Address: [address]          [place], [place] [address] [place] Mobile Phone: [phone] Relation: Daughter Secondary Emergency Contact: Krisher,Crystille Mobile Phone: [phone] Relation: Daughter Preferred language: English Interpreter needed? No  Code Status:  Full Code Goals of care: Advanced Directive information    03/18/2024   12:48 PM  Advanced Directives  Does Patient Have a Medical Advance Directive? No  Would patient like information on creating a medical advance directive? No - Patient declined     Chief Complaint  Patient presents with   Establish Care    New Patient Appointment.    Discussed the use of AI scribe software for clinical note transcription with the patient, who gave verbal consent to proceed.  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  She has experienced significant weight loss of nearly 90 pounds over the past year and a half, despite maintaining her usual diet. She describes a pattern of gaining one pound and losing several, with no clear explanation for the fluctuations. Persistent fatigue and night sweats accompany the weight loss, but there is no fever. She feels constantly tired and unable to sleep well. Palpitations and occasional chest pain are present.  She describes a painful rash that began in February, initially thought to be an allergic reaction and later diagnosed as dermatitis. The rash, which started small, has now spread to cover half of her back and is described as burning and feeling like 'needles and pins.' It is  very painful and sometimes itchy. A biopsy was performed recently by dermatologist, and she is awaiting results. She also mentions a history of rashes on her hands that were painful but resolved quickly. Swollen lymph nodes have been persistent since February, located in the groin, armpits, and neck, and more have appeared over time. She attributes the visibility of the lymph nodes to her significant weight loss.  She has a history of difficulty swallowing, described as spasms in her throat and neck, making it difficult to swallow certain foods like pork chops and bread. An endoscopy and a swallow test did not reveal any obstructions or scar tissue. She continues to experience nausea, vomiting, and abdominal bloating. She takes Zofran  for nausea and famotidine  for acid reflux. She uses Miralax daily for constipation, as she is unable to have bowel movements without it. She avoids refined flour, pasta, and rice, focusing on protein and vegetables in her diet. She also reports lactose intolerance, managing it with lactose-free milk.  She has a history of reactive airway disease and a long history of smoking. No daily coughing is reported, but symptoms occur when she is sick. She also has a history of seizure disorder, previously managed with Dilantin, and describes recent episodes of 'weird seizures' and blackouts, characterized by a metallic taste in her mouth and temporary loss of movement in her arms.  No history of high blood pressure or thyroid  issues, although her mother had thyroid  problems. She has been screened for hepatitis C and HIV, both of which were negative. She reports a history of diverticulitis and manages her diet to avoid pain. She  has experienced light sensitivity, which she associates with her seizures. She has been separated from her spouse for five years and is not sexually active during this time. She works in a physically demanding job.    Past Medical History:  Diagnosis Date    Allergy    anemia    Anemia    Anxiety    Aortic atherosclerosis (HCC)    Arthritis    B12 deficiency    Back pain    BRCA negative 11/2013   Chest pain    Constipation    Depression    Fatty liver    Gallbladder problem    GERD (gastroesophageal reflux disease)    H pylori ulcer    Heartburn    Joint pain    Migraines    Palpitation    Pre-diabetes    Seizures (HCC)    3 YEARS AGO UPDATED 11/07/22   sleep apnea    Stomach ulcer    Ulcer of gastric fundus    Vitamin D  deficiency    Past Surgical History:  Procedure Laterality Date   CESAREAN SECTION  8004,8001   COLONOSCOPY     ENDOMETRIAL CRYOABLATION  07/10/2011   HER OPTION - office performed   LAPAROSCOPIC APPENDECTOMY N/A 09/03/2021   Procedure: APPENDECTOMY LAPAROSCOPIC;  Surgeon: Rubin Calamity, MD;  Location: [facility];  Service: General;  Laterality: N/A;   TONSILLECTOMY  07/22/1985    Allergies  Allergen Reactions   Aspirin Swelling and Other (See Comments)    Bleeding   Augmentin [Amoxicillin -Pot Clavulanate] Swelling   Oxycodone  Nausea And Vomiting    Allergies as of 03/18/2024       Reactions   Aspirin Swelling, Other (See Comments)   Bleeding   Augmentin [amoxicillin -pot Clavulanate] Swelling   Oxycodone  Nausea And Vomiting        Medication List        Accurate as of March 18, 2024 10:36 PM. If you have any questions, ask your nurse or doctor.          STOP taking these medications    clobetasol  ointment 0.05 % Commonly known as: TEMOVATE  Stopped by: Roxan BROCKS Myquan Schaumburg   dicyclomine  20 MG tablet Commonly known as: BENTYL  Stopped by: Chantell Kunkler C Cicilia Clinger   triamcinolone  ointment 0.5 % Commonly known as: KENALOG  Stopped by: Hussain Maimone C Myranda Pavone       TAKE these medications    famotidine  20 MG tablet Commonly known as: PEPCID  Take 1 tablet (20 mg total) by mouth 2 (two) times daily.   hydrOXYzine  10 MG tablet Commonly known as: ATARAX  Take 1 tablet (10 mg total) by mouth 3  (three) times daily as needed for anxiety or itching. Started by: Mica Releford C Abiel Antrim   MiraLax 17 g packet Generic drug: polyethylene glycol Take 17 g by mouth daily.   ondansetron  4 MG disintegrating tablet Commonly known as: ZOFRAN -ODT Take 1 tablet (4 mg total) by mouth every 8 (eight) hours as needed.        Review of Systems  Constitutional:  Positive for fatigue and unexpected weight change. Negative for appetite change, chills and fever.  HENT:  Positive for trouble swallowing. Negative for congestion, ear discharge, ear pain, hearing loss, nosebleeds, postnasal drip, rhinorrhea, sinus pressure, sinus pain, sneezing, sore throat and tinnitus.   Eyes:  Negative for pain, discharge, redness, itching and visual disturbance.  Respiratory:  Negative for cough, chest tightness, shortness of breath and wheezing.   Cardiovascular:  Negative for chest pain, palpitations  and leg swelling.  Gastrointestinal:  Positive for constipation. Negative for abdominal distention, abdominal pain, blood in stool, diarrhea, nausea and vomiting.       Chronic nausea and vomiting  Endocrine: Positive for cold intolerance. Negative for heat intolerance, polydipsia, polyphagia and polyuria.  Genitourinary:  Negative for difficulty urinating, dysuria, flank pain, frequency and urgency.  Musculoskeletal:  Negative for arthralgias, back pain, gait problem, joint swelling, myalgias, neck pain and neck stiffness.  Skin:  Negative for color change, pallor and wound.       Chronic rash  Neurological:  Negative for dizziness, syncope, speech difficulty, weakness, light-headedness, numbness and headaches.  Hematological:  Does not bruise/bleed easily.  Psychiatric/Behavioral:  Negative for agitation, behavioral problems, confusion, hallucinations and sleep disturbance. The patient is not nervous/anxious.     Immunization History  Administered Date(s) Administered   Influenza, Seasonal, Injecte, Preservative Fre  04/29/2023   Influenza,inj,Quad PF,6+ Mos 05/16/2021   Influenza-Unspecified 06/30/2011   Moderna Sars-Covid-2 Vaccination 01/31/2021, 03/03/2021   PFIZER Comirnaty(Gray Top)Covid-19 Tri-Sucrose Vaccine 01/20/2020, 02/21/2020   Rabies, IM 01/03/2018, 01/06/2018, 01/10/2018, 01/17/2018   Tdap 01/03/2018   Zoster Recombinant(Shingrix) 05/16/2021   Pertinent  Health Maintenance Due  Topic Date Due   INFLUENZA VACCINE  02/20/2024   MAMMOGRAM  09/10/2024   Colonoscopy  Discontinued      02/05/2023    8:48 AM 04/29/2023    1:38 PM 11/10/2023    3:13 PM 02/05/2024    3:53 PM 03/18/2024   12:46 PM  Fall Risk  Falls in the past year? 0 1 1  1   Was there an injury with Fall? 0 1 0 1 1  Fall Risk Category Calculator 0 2 2  2   Patient at Risk for Falls Due to No Fall Risks  No Fall Risks Other (Comment) No Fall Risks;History of fall(s)  Patient at Risk for Falls Due to - Comments    Pt lost consciousness and fell and cracked her head open   Fall risk Follow up Falls evaluation completed  Falls evaluation completed Falls evaluation completed Falls evaluation completed   Functional Status Survey:    Vitals:   03/18/24 1249  BP: 114/68  Pulse: 83  Resp: 17  Temp: 97.6 F (36.4 C)  SpO2: 99%  Weight: 119 lb 6.4 oz (54.2 kg)  Height: 5' 7.5 (1.715 m)   Body mass index is 18.42 kg/m. Physical Exam  VITALS: T- 97.6, P- 83, BP- 114/68, SaO2- 99% MEASUREMENTS: Weight- 119. GENERAL: Alert, cooperative, well developed, no acute distress. HEENT: Normocephalic, normal oropharynx, moist mucous membranes, ears normal bilaterally, nose normal, pupils equal, round, and reactive to light. NECK: Thyroid  normal, swollen lymph nodes in neck. CHEST: Clear to auscultation bilaterally, no wheezes, rhonchi, or crackles. CARDIOVASCULAR: Normal heart rate and rhythm, S1 and S2 normal without murmurs. ABDOMEN: Soft, non-tender, non-distended, without organomegaly, normal bowel sounds. GENITOURINARY:  Painful lymph nodes in groin. EXTREMITIES: No cyanosis or edema, no tenderness in calf muscles bilaterally. NEUROLOGICAL: Cranial nerves grossly intact, moves all extremities without gross motor or sensory deficit. SKIN: Painful rash on lower back.  PSYCHIATRY/BEHAVIORAL: Mood stable   Labs reviewed: Recent Labs    04/10/23 1124 07/25/23 0910 11/05/23 2035 11/10/23 1548 12/09/23 2226 02/11/24 2133 02/11/24 2135  NA 140 137   < > 137 138 140 139  K 4.6 4.5   < > 4.2 3.9 4.3 4.0  CL 100 104   < > 102 103 102 103  CO2 23 24   < >  26 25 28   --   GLUCOSE 75 92   < > 91 111* 130* 121*  BUN 25* 28*   < > 25* 31* 24* 23*  CREATININE 0.89 0.88   < > 0.78 0.94 0.87 0.90  CALCIUM 10.1 10.1   < > 9.9 10.1 10.1  --   MG 2.2 2.1  --   --   --   --   --   PHOS 4.5*  --   --   --   --   --   --    < > = values in this interval not displayed.   Recent Labs    11/05/23 2035 11/10/23 1548 12/09/23 2226  AST 19 16 31   ALT 16 13 27   ALKPHOS 66 51 60  BILITOT 0.6 0.4 0.8  PROT 8.1 7.3 7.8  ALBUMIN 4.8 4.6 4.5   Recent Labs    11/10/23 1548 12/09/23 2226 02/11/24 2133 02/11/24 2135  WBC 7.9 7.7 6.3  --   NEUTROABS 5.9 4.4 2.6  --   HGB 12.8 13.7 13.4 14.3  HCT 38.3 41.8 40.7 42.0  MCV 91.9 91.5 92.9  --   PLT 332.0 347 337  --    Lab Results  Component Value Date   TSH 0.687 04/10/2023   Lab Results  Component Value Date   HGBA1C 5.6 02/05/2023   Lab Results  Component Value Date   CHOL 221 (H) 11/07/2021   HDL 44 11/07/2021   LDLCALC 154 (H) 11/07/2021   LDLDIRECT 156.0 06/18/2021   TRIG 125 11/07/2021   CHOLHDL 5.0 (H) 11/07/2021    Significant Diagnostic Results in last 30 days:  No results found.  Assessment/Plan  Unintentional weight loss and associated fatigue Significant weight loss of nearly 90 pounds over the past year and a half, accompanied by fatigue and night sweats. Previous tests ruled out leukemia and rheumatoid arthritis. Current labs show  glucose of 121 mg/dL, indicating possible diabetes. A1c was 5.6% a year ago, suggesting prediabetes. - Order CBC, CMP, thyroid  function tests, and A1c - Follow up in one month to review lab results and monitor weight  Generalized lymphadenopathy Persistent and widespread lymphadenopathy, including in the groin and armpits, with some nodes being tender. No fever, but history of night sweats. Previous emergency room visit ruled out leukemia. Biopsy of rash pending. - Await biopsy results - Order Lyme disease test  Chronic pruritic and painful rash, lower back Chronic rash on the lower back, described as painful and pruritic, with a biopsy performed recently. Rash has been present since February and has spread. Previous treatments with triamcinolone  cream were ineffective and caused burning. - Await biopsy results - Prescribe hydroxyzine  for itching, with caution about sedative effects  Type 2 diabetes mellitus Previously diagnosed with prediabetes, with a recent glucose level of 121 mg/dL. A1c was 5.6% a year ago. Weight loss may have impacted glucose levels. Currently not on metformin  as it was not effective previously. - Order A1c to assess current diabetic status - Monitor blood glucose levels  Gastroesophageal reflux disease (GERD) Ongoing issues with acid reflux. Currently managed with famotidine . No recent endoscopy findings indicating obstruction or scar tissue. - Continue famotidine  - Refill famotidine  prescription  Chronic nausea and vomiting Intermittent nausea and vomiting. Currently managed with Zofran . - Continue Zofran  - Refill Zofran  prescription  Dysphagia with esophageal spasm Intermittent dysphagia with esophageal spasms, particularly with certain foods. Previous endoscopy and swallow test showed no obstruction or scar tissue.  Chronic  constipation Chronic constipation managed with daily Miralax. No recent changes in bowel habits reported. - Continue daily Miralax -  Add Miralax to medication list  Seizure disorder Seizure disorder, previously on Dilantin. Recent episodes described as partial seizures with altered consciousness and metallic taste. No recent neurologist consultation. - Avoid activities that may trigger seizures, such as bright lights  General Health Maintenance No recent cholesterol check. Vitamin D  supplementation ongoing. No recent dental visit, but an appointment is scheduled for October. - Order cholesterol test - Continue vitamin D  supplementation    Family/ staff Communication: Reviewed plan of care with patient  Labs/tests ordered:  - CBC with Differential/Platelet - CMP with eGFR([facility]) - TSH - Hgb A1C - Lipid panel - Lyme disease   Next Appointment : Return in about 1 month (around 04/18/2024) for weight loss .   Spent 45 minutes of Face to face and non-face to face with patient  >50% time spent counseling; reviewing medical record; tests; labs; documentation and developing future plan of care.   Roxan JAYSON Plough, NP

## 2024-03-19 ENCOUNTER — Other Ambulatory Visit: Payer: Self-pay | Admitting: Nurse Practitioner

## 2024-03-19 DIAGNOSIS — Z1231 Encounter for screening mammogram for malignant neoplasm of breast: Secondary | ICD-10-CM

## 2024-03-23 LAB — B. BURGDORFI ANTIBODIES BY WB
B burgdorferi IgG Abs (IB): NEGATIVE
B burgdorferi IgM Abs (IB): NEGATIVE
Lyme Disease 18 kD IgG: NONREACTIVE
Lyme Disease 23 kD IgG: NONREACTIVE
Lyme Disease 23 kD IgM: NONREACTIVE
Lyme Disease 28 kD IgG: NONREACTIVE
Lyme Disease 30 kD IgG: NONREACTIVE
Lyme Disease 39 kD IgG: NONREACTIVE
Lyme Disease 39 kD IgM: NONREACTIVE
Lyme Disease 41 kD IgG: NONREACTIVE
Lyme Disease 41 kD IgM: NONREACTIVE
Lyme Disease 45 kD IgG: REACTIVE — AB
Lyme Disease 58 kD IgG: NONREACTIVE
Lyme Disease 66 kD IgG: NONREACTIVE
Lyme Disease 93 kD IgG: REACTIVE — AB

## 2024-03-23 LAB — CBC WITH DIFFERENTIAL/PLATELET
Absolute Lymphocytes: 2136 {cells}/uL (ref 850–3900)
Absolute Monocytes: 384 {cells}/uL (ref 200–950)
Basophils Absolute: 60 {cells}/uL (ref 0–200)
Basophils Relative: 1 %
Eosinophils Absolute: 174 {cells}/uL (ref 15–500)
Eosinophils Relative: 2.9 %
HCT: 42.6 % (ref 35.0–45.0)
Hemoglobin: 14 g/dL (ref 11.7–15.5)
MCH: 30.6 pg (ref 27.0–33.0)
MCHC: 32.9 g/dL (ref 32.0–36.0)
MCV: 93.2 fL (ref 80.0–100.0)
MPV: 10 fL (ref 7.5–12.5)
Monocytes Relative: 6.4 %
Neutro Abs: 3246 {cells}/uL (ref 1500–7800)
Neutrophils Relative %: 54.1 %
Platelets: 315 Thousand/uL (ref 140–400)
RBC: 4.57 Million/uL (ref 3.80–5.10)
RDW: 12.4 % (ref 11.0–15.0)
Total Lymphocyte: 35.6 %
WBC: 6 Thousand/uL (ref 3.8–10.8)

## 2024-03-23 LAB — COMPREHENSIVE METABOLIC PANEL WITH GFR
AG Ratio: 1.8 (calc) (ref 1.0–2.5)
ALT: 13 U/L (ref 6–29)
AST: 15 U/L (ref 10–35)
Albumin: 4.9 g/dL (ref 3.6–5.1)
Alkaline phosphatase (APISO): 63 U/L (ref 37–153)
BUN/Creatinine Ratio: 33 (calc) — ABNORMAL HIGH (ref 6–22)
BUN: 28 mg/dL — ABNORMAL HIGH (ref 7–25)
CO2: 28 mmol/L (ref 20–32)
Calcium: 10.4 mg/dL (ref 8.6–10.4)
Chloride: 100 mmol/L (ref 98–110)
Creat: 0.85 mg/dL (ref 0.50–1.03)
Globulin: 2.8 g/dL (ref 1.9–3.7)
Glucose, Bld: 85 mg/dL (ref 65–99)
Potassium: 4.3 mmol/L (ref 3.5–5.3)
Sodium: 136 mmol/L (ref 135–146)
Total Bilirubin: 0.4 mg/dL (ref 0.2–1.2)
Total Protein: 7.7 g/dL (ref 6.1–8.1)
eGFR: 79 mL/min/1.73m2 (ref >=60)

## 2024-03-23 LAB — LIPID PANEL
Cholesterol: 171 mg/dL (ref <200)
HDL: 55 mg/dL (ref >=50)
LDL Cholesterol (Calc): 95 mg/dL
Non-HDL Cholesterol (Calc): 116 mg/dL (ref <130)
Total CHOL/HDL Ratio: 3.1 (calc) (ref <5.0)
Triglycerides: 109 mg/dL (ref <150)

## 2024-03-23 LAB — TSH: TSH: 0.46 m[IU]/L (ref 0.40–4.50)

## 2024-03-23 LAB — HEMOGLOBIN A1C
Hgb A1c MFr Bld: 5.3 % (ref <5.7)
Mean Plasma Glucose: 105 mg/dL
eAG (mmol/L): 5.8 mmol/L

## 2024-03-24 ENCOUNTER — Ambulatory Visit: Payer: Self-pay

## 2024-03-24 NOTE — Telephone Encounter (Signed)
 Noted

## 2024-03-24 NOTE — Telephone Encounter (Signed)
 FYI Only or Action Required?: Action required by provider: clinical question for provider and update on patient condition.  Patient was last seen in primary care on 03/18/2024 by Ngetich, Roxan BROCKS, NP.  Called Nurse Triage reporting Joint Pain, Headache, and Rash.  Symptoms began yesterday.  Interventions attempted: Nothing.  Symptoms are: gradually worsening.  Triage Disposition: Go to ED Now (or PCP Triage)  Patient/caregiver understands and will follow disposition?: Yes, but will wait                             Copied from CRM [id number]. Topic: Clinical - Red Word Triage >> Mar 24, 2024  3:07 PM Shamecia H wrote: [name] that prompted transfer to Nurse Triage: Patients daughter Rayfield called in and stated she got her results and tested positive for lime disease results and she is wanting to speak to the provider because she is aching and itching and having a flair up. She is having brain fog and she has a really bad headache. Patient stated she also feels like she has the flu. She is wanting to know if she will be provided some medicine for it and what her next steps would be. She is wanting to go to the ER that's how bad it is. Reason for Disposition  Patient sounds very sick or weak to the triager  Answer Assessment - Initial Assessment Questions 1. ONSET: When did the muscle aches or body pains start?      Last night  2. LOCATION: What part of your body is hurting? (e.g., entire body, arms, legs)      Daughter states pain is widespread 3. SEVERITY: How bad is the pain? (Scale 1-10; or mild, moderate, severe)     Daughter states pain is severe, states patient has been crying due to pain 4. CAUSE: What do you think is causing the pains?     Flare-up of Lyme Disease 5. FEVER: Do you have a fever? If Yes, ask: What is your temperature, how was it measured, and  when did it start?      Denies 6. OTHER SYMPTOMS: Do you have any other symptoms?  (e.g., chest pain, cold or flu symptoms, rash, weakness, weight loss)     Weakness in arm joints, fatigue, itching and burning rash on back    Patient was seen in office on 03/18/24 and symptoms have worsened. Daughter, Hilaria, stated patient has been in and out of the ED since January. Advised ED at this time. Daughter verbalized understanding and stated she would encourage patient to go to ED.  Daughter is requesting a call back from office ASAP to go over patient's lab results and determine a plan of care. Please contact daughter at [phone].  Protocols used: Muscle Aches and Body Pain-A-AH

## 2024-03-25 ENCOUNTER — Encounter: Payer: Self-pay | Admitting: Adult Health

## 2024-03-25 ENCOUNTER — Ambulatory Visit: Admitting: Adult Health

## 2024-03-25 ENCOUNTER — Ambulatory Visit: Payer: Self-pay | Admitting: Family

## 2024-03-25 VITALS — BP 112/68 | HR 80 | Temp 98.3°F | Resp 18 | Ht 67.5 in | Wt 120.8 lb

## 2024-03-25 DIAGNOSIS — R591 Generalized enlarged lymph nodes: Secondary | ICD-10-CM | POA: Diagnosis not present

## 2024-03-25 DIAGNOSIS — Z8669 Personal history of other diseases of the nervous system and sense organs: Secondary | ICD-10-CM

## 2024-03-25 DIAGNOSIS — K219 Gastro-esophageal reflux disease without esophagitis: Secondary | ICD-10-CM

## 2024-03-25 DIAGNOSIS — R1013 Epigastric pain: Secondary | ICD-10-CM

## 2024-03-25 DIAGNOSIS — A6920 Lyme disease, unspecified: Secondary | ICD-10-CM | POA: Diagnosis not present

## 2024-03-25 DIAGNOSIS — K5909 Other constipation: Secondary | ICD-10-CM

## 2024-03-25 MED ORDER — DOXYCYCLINE HYCLATE 100 MG PO TABS: 100.0000 mg | ORAL_TABLET | Freq: Two times a day (BID) | ORAL | 0 refills | Status: DC

## 2024-03-25 NOTE — Progress Notes (Signed)
 [facility] clinic  Provider:  Jereld Serum DNP  Code Status:  Full Code  Goals of Care:     03/18/2024   12:48 PM  Advanced Directives  Does Patient Have a Medical Advance Directive? No  Would patient like information on creating a medical advance directive? No - Patient declined     Chief Complaint  Patient presents with   Rash    Unresolved rash    Discussed the use of AI scribe software for clinical note transcription with the patient, who gave verbal consent to proceed.  HPI: Patient is a 58 y.o. female seen today for an acute visit for unresolved rash.  She has been experiencing a rash since February, which began as a small spot and has spread across her back. The rash is painful and described as feeling like 'needles' and 'razor stabs'. She has been using Atarax  10 mg three times a day as needed, but it has not been effective. Triamcinolone  cream was discontinued due to burning. A biopsy was performed last Tuesday, and results are pending.  She was diagnosed with Lyme disease two days ago after a reactive test. Since February, she has experienced severe back pain, muscle pain, cramps, headaches, neck stiffness, and swollen lymph nodes. She experiences significant lymphadenopathy, with swollen nodes in the neck, right armpit, and bilateral groin. The nodes are large and tender. She has not been informed about the treatment plan for Lyme disease yet.  She reports a history of weight loss, losing 92 pounds over a year and a half without trying.  She has a history of abdominal pain for the last two years, described as severe and located in both the upper and lower abdomen. She takes Miralax daily for constipation. A CT of the abdomen in May 2025 showed no acute findings. She has a history of H. pylori infection, which was negative upon recent testing.  She has a history of seizures, with the last episode occurring a month ago. She describes losing consciousness during these  episodes. She is currently under evaluation by a neurologist and is not on medication for seizures.  She occasionally smokes, about two to three cigarettes on weekends.    Past Medical History:  Diagnosis Date   Allergy    anemia    Anemia    Anxiety    Aortic atherosclerosis (HCC)    Arthritis    B12 deficiency    Back pain    BRCA negative 11/2013   Chest pain    Constipation    Depression    Fatty liver    Gallbladder problem    GERD (gastroesophageal reflux disease)    H pylori ulcer    Heartburn    Joint pain    Migraines    Palpitation    Pre-diabetes    Seizures (HCC)    3 YEARS AGO UPDATED 11/07/22   sleep apnea    Stomach ulcer    Ulcer of gastric fundus    Vitamin D  deficiency     Past Surgical History:  Procedure Laterality Date   CESAREAN SECTION  8004,8001   COLONOSCOPY     ENDOMETRIAL CRYOABLATION  07/10/2011   HER OPTION - office performed   LAPAROSCOPIC APPENDECTOMY N/A 09/03/2021   Procedure: APPENDECTOMY LAPAROSCOPIC;  Surgeon: Rubin Calamity, MD;  Location: [facility];  Service: General;  Laterality: N/A;   TONSILLECTOMY  07/22/1985    Allergies  Allergen Reactions   Aspirin Swelling and Other (See Comments)    Bleeding  Augmentin [Amoxicillin -Pot Clavulanate] Swelling   Oxycodone  Nausea And Vomiting    Outpatient Encounter Medications as of 03/25/2024  Medication Sig   famotidine  (PEPCID ) 20 MG tablet Take 1 tablet (20 mg total) by mouth 2 (two) times daily.   hydrOXYzine  (ATARAX ) 10 MG tablet Take 1 tablet (10 mg total) by mouth 3 (three) times daily as needed for anxiety or itching.   ondansetron  (ZOFRAN -ODT) 4 MG disintegrating tablet Take 1 tablet (4 mg total) by mouth every 8 (eight) hours as needed.   polyethylene glycol (MIRALAX) 17 g packet Take 17 g by mouth daily.   [DISCONTINUED] doxycycline  (VIBRA -TABS) 100 MG tablet Take 1 tablet (100 mg total) by mouth 2 (two) times daily for 10 days.   No facility-administered encounter  medications on file as of 03/25/2024.    Review of Systems:  Review of Systems  Constitutional:  Negative for appetite change, chills, fatigue and fever.  HENT:  Negative for congestion, hearing loss, rhinorrhea and sore throat.   Eyes: Negative.   Respiratory:  Negative for cough, shortness of breath and wheezing.   Cardiovascular:  Negative for chest pain, palpitations and leg swelling.  Gastrointestinal:  Positive for abdominal pain. Negative for constipation, diarrhea, nausea and vomiting.  Genitourinary:  Negative for dysuria.  Musculoskeletal:  Negative for arthralgias, back pain and myalgias.  Skin:  Positive for rash. Negative for color change and wound.  Neurological:  Negative for dizziness, weakness and headaches.  Psychiatric/Behavioral:  Negative for behavioral problems. The patient is not nervous/anxious.     Health Maintenance  Topic Date Due   Pneumococcal Vaccine: 50+ Years (1 of 2 - PCV) Never done   Hepatitis B Vaccines 19-59 Average Risk (1 of 3 - 19+ 3-dose series) Never done   Zoster Vaccines- Shingrix (2 of 2) 07/11/2021   Influenza Vaccine  02/20/2024   MAMMOGRAM  09/10/2024   Cervical Cancer Screening (HPV/Pap Cotest)  08/28/2027   DTaP/Tdap/Td (2 - Td or Tdap) 01/04/2028   Hepatitis C Screening  Completed   HIV Screening  Completed   HPV VACCINES  Aged Out   Meningococcal B Vaccine  Aged Out   Colonoscopy  Discontinued   COVID-19 Vaccine  Discontinued    Physical Exam: Vitals:   03/25/24 1445  BP: 112/68  Pulse: 80  Resp: 18  Temp: 98.3 F (36.8 C)  SpO2: 98%  Weight: 120 lb 12.8 oz (54.8 kg)  Height: 5' 7.5 (1.715 m)   Body mass index is 18.64 kg/m. Physical Exam Constitutional:      Appearance: Normal appearance.  HENT:     Head: Normocephalic and atraumatic.     Nose: Nose normal.     Mouth/Throat:     Mouth: Mucous membranes are moist.  Eyes:     Conjunctiva/sclera: Conjunctivae normal.  Neck:     Comments: Enlarge lymph nodes  on neck and right axilla Cardiovascular:     Rate and Rhythm: Normal rate and regular rhythm.  Pulmonary:     Effort: Pulmonary effort is normal.     Breath sounds: Normal breath sounds.  Abdominal:     General: Bowel sounds are normal.     Palpations: Abdomen is soft.  Musculoskeletal:        General: Normal range of motion.     Cervical back: Normal range of motion.  Skin:    General: Skin is warm and dry.  Neurological:     General: No focal deficit present.     Mental Status: She  is alert and oriented to person, place, and time.  Psychiatric:        Mood and Affect: Mood normal.        Behavior: Behavior normal.        Thought Content: Thought content normal.        Judgment: Judgment normal.     Labs reviewed: Basic Metabolic Panel: Recent Labs    04/10/23 1124 07/25/23 0910 11/05/23 2035 12/09/23 2226 02/11/24 2133 02/11/24 2135 03/18/24 1406  NA 140 137   < > 138 140 139 136  K 4.6 4.5   < > 3.9 4.3 4.0 4.3  CL 100 104   < > 103 102 103 100  CO2 23 24   < > 25 28  --  28  GLUCOSE 75 92   < > 111* 130* 121* 85  BUN 25* 28*   < > 31* 24* 23* 28*  CREATININE 0.89 0.88   < > 0.94 0.87 0.90 0.85  CALCIUM 10.1 10.1   < > 10.1 10.1  --  10.4  MG 2.2 2.1  --   --   --   --   --   PHOS 4.5*  --   --   --   --   --   --   TSH 0.687  --   --   --   --   --  0.46   < > = values in this interval not displayed.   Liver Function Tests: Recent Labs    11/05/23 2035 11/10/23 1548 12/09/23 2226 03/18/24 1406  AST 19 16 31 15   ALT 16 13 27 13   ALKPHOS 66 51 60  --   BILITOT 0.6 0.4 0.8 0.4  PROT 8.1 7.3 7.8 7.7  ALBUMIN 4.8 4.6 4.5  --    Recent Labs    07/25/23 0910 12/09/23 2226 03/26/24 1001  LIPASE 41 63*  --   AMYLASE  --   --  67   No results for input(s): AMMONIA in the last 8760 hours. CBC: Recent Labs    12/09/23 2226 02/11/24 2133 02/11/24 2135 03/18/24 1406  WBC 7.7 6.3  --  6.0  NEUTROABS 4.4 2.6  --  3,246  HGB 13.7 13.4 14.3 14.0   HCT 41.8 40.7 42.0 42.6  MCV 91.5 92.9  --  93.2  PLT 347 337  --  315   Lipid Panel: Recent Labs    03/18/24 1406  CHOL 171  HDL 55  LDLCALC 95  TRIG 109  CHOLHDL 3.1   Lab Results  Component Value Date   HGBA1C 5.3 03/18/2024    Procedures since last visit: No results found.  Assessment/Plan  1. Lyme disease (Primary) -  Diagnosed with Lyme disease two days ago. Symptoms include rash, muscle pain, cramps, headaches, neck stiffness, and generalized pain. - Prescribe doxycycline  100 mg twice a day for 10 days. - Refer to infectious disease specialist. - Ambulatory referral to Infectious Disease  2. ABDOMINAL PAIN, EPIGASTRIC -  Chronic abdominal pain for the last two years, with significant weight loss of 92 pounds over a year and a half. Previous CT of the abdomen showed no acute findings. History of H. pylori infection, currently negative. Lipase slightly elevated. - Repeat lipase test. - Order amylase test. - H. pylori breath test - Amylase - Lipase  3. Gastroesophageal reflux disease without esophagitis -Continue Pepcid   4. Chronic constipation -  continue Miralax 17 gm PO daily  5. Lymphadenopathy Chronic  erythematous rash on the back since February, painful, itchy, and burning. Biopsy performed last week, results pending. Dermatologist advised to stop all topical treatments due to skin sensitivity. - Await biopsy results. - Continue to avoid topical treatments as per dermatologist's advice. - Ambulatory referral to Hematology / Oncology  6. Hx of seizure disorder -  Last seizure occurred one month ago, characterized by loss of consciousness and incontinence. - Continue evaluation with neurologist.     Labs/tests ordered: H. pylori breath test, lipase, amylase   Return if symptoms worsen or fail to improve.  Pablo Mathurin Medina-Vargas, NP

## 2024-03-25 NOTE — Telephone Encounter (Signed)
 I have called and spoken to patient. She states that this rash is unresolved and getting worse. Patient states that she cannot run water from shower on her back because it hurts so bad. Patient scheduled for in office visit today with Jereld Delude, NP due to PCP Ngetich, Roxan BROCKS, NP being fully booked. Message routed to scheduled NP as FYI.

## 2024-03-26 ENCOUNTER — Other Ambulatory Visit

## 2024-03-26 ENCOUNTER — Ambulatory Visit (HOSPITAL_BASED_OUTPATIENT_CLINIC_OR_DEPARTMENT_OTHER)
Admission: RE | Admit: 2024-03-26 | Discharge: 2024-03-26 | Disposition: A | Discharge status: Home / Self Care | Source: Ambulatory Visit | Attending: Nurse Practitioner | Admitting: Nurse Practitioner

## 2024-03-26 ENCOUNTER — Encounter (HOSPITAL_BASED_OUTPATIENT_CLINIC_OR_DEPARTMENT_OTHER): Payer: Self-pay | Admitting: Radiology

## 2024-03-26 DIAGNOSIS — Z1231 Encounter for screening mammogram for malignant neoplasm of breast: Secondary | ICD-10-CM | POA: Diagnosis present

## 2024-03-29 ENCOUNTER — Ambulatory Visit: Payer: Self-pay | Admitting: Physician Assistant

## 2024-03-29 LAB — AMYLASE: Amylase: 67 U/L (ref 21–101)

## 2024-03-29 LAB — H. PYLORI BREATH TEST: H. pylori Breath Test: DETECTED — AB

## 2024-03-29 NOTE — Progress Notes (Signed)
 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 She has been diagnosed with Lyme disease.  MyChart message sent.

## 2024-03-30 ENCOUNTER — Telehealth: Payer: Self-pay

## 2024-03-30 ENCOUNTER — Other Ambulatory Visit: Payer: Self-pay | Admitting: Adult Health

## 2024-03-30 ENCOUNTER — Ambulatory Visit: Payer: Self-pay | Admitting: Adult Health

## 2024-03-30 DIAGNOSIS — A048 Other specified bacterial intestinal infections: Secondary | ICD-10-CM

## 2024-03-30 MED ORDER — PANTOPRAZOLE SODIUM 40 MG PO TBEC: 40.0000 mg | DELAYED_RELEASE_TABLET | Freq: Two times a day (BID) | ORAL | 0 refills | Status: DC

## 2024-03-30 MED ORDER — METRONIDAZOLE 500 MG PO TABS: 500.0000 mg | ORAL_TABLET | Freq: Three times a day (TID) | ORAL | 0 refills | Status: AC

## 2024-03-30 MED ORDER — TETRACYCLINE HCL 500 MG PO CAPS: 500.0000 mg | ORAL_CAPSULE | Freq: Four times a day (QID) | ORAL | 0 refills | Status: AC

## 2024-03-30 NOTE — Telephone Encounter (Signed)
 MyChart message sent to patient.

## 2024-03-30 NOTE — Progress Notes (Signed)
-    H. Pylori breath test was detected, ordered Tetracycline , Protonix  and Metronidazole , sent eRx to pharmacy, discussed with patient over the phone. -  amylase normal

## 2024-03-30 NOTE — Telephone Encounter (Signed)
Message routed to PCP Ngetich, Dinah C, NP  

## 2024-03-30 NOTE — Telephone Encounter (Signed)
 Copied from CRM [id number]. Topic: Referral - Status >> Mar 30, 2024  3:09 PM Mercer PEDLAR wrote: Reason for CRM: Colene calling from cancer center regarding oncology referral, she is calling to notify office that the referral will be closed due to patient having lyme disease.  Callback: [phone]

## 2024-03-30 NOTE — Telephone Encounter (Signed)
 Please notify patient referral cancellation for Oncology.

## 2024-03-30 NOTE — Telephone Encounter (Signed)
 Message routed to PCP Ngetich, Roxan BROCKS, NP. Please advise.

## 2024-03-31 NOTE — Progress Notes (Signed)
-    pls discontinue taking Doxycycline  once you start on Tetracycline

## 2024-04-02 ENCOUNTER — Encounter: Payer: Self-pay | Admitting: Family

## 2024-04-02 ENCOUNTER — Other Ambulatory Visit: Payer: Self-pay | Admitting: Family

## 2024-04-02 ENCOUNTER — Telehealth: Admitting: Family

## 2024-04-02 DIAGNOSIS — B009 Herpesviral infection, unspecified: Secondary | ICD-10-CM | POA: Diagnosis not present

## 2024-04-02 DIAGNOSIS — A048 Other specified bacterial intestinal infections: Secondary | ICD-10-CM

## 2024-04-02 DIAGNOSIS — R591 Generalized enlarged lymph nodes: Secondary | ICD-10-CM

## 2024-04-02 DIAGNOSIS — K219 Gastro-esophageal reflux disease without esophagitis: Secondary | ICD-10-CM

## 2024-04-02 DIAGNOSIS — Z8669 Personal history of other diseases of the nervous system and sense organs: Secondary | ICD-10-CM

## 2024-04-02 DIAGNOSIS — R200 Anesthesia of skin: Secondary | ICD-10-CM | POA: Diagnosis not present

## 2024-04-02 DIAGNOSIS — G40909 Epilepsy, unspecified, not intractable, without status epilepticus: Secondary | ICD-10-CM | POA: Diagnosis not present

## 2024-04-02 MED ORDER — VALACYCLOVIR HCL 1 G PO TABS: 1000.0000 mg | ORAL_TABLET | Freq: Two times a day (BID) | ORAL | 0 refills | Status: AC

## 2024-04-02 NOTE — Progress Notes (Signed)
 This service is provided via telemedicine  No vital signs collected/recorded due to the encounter was a telemedicine visit.   Location of patient (ex: home, work):  Home  Patient consents to a telephone visit: Yes  Location of the provider (ex: office, home):  [facility], Office   Name of any referring provider:  N/A  Names of all persons participating in the telemedicine service and their role in the encounter:  Anne Lewis, CMA, Patient, and Anne Lewis, Anne BROCKS, NP   Time spent on call:  9 min with medical assistant    Location:      Place of Service:    Provider: Ceira Hoeschen FNP-C  Anne Lewis, Anne BROCKS, NP  Patient Care Team: Tangy Drozdowski, Anne BROCKS, NP as PCP - General (Family Medicine)  Extended Emergency Contact Information Primary Emergency Contact: Lewis,Anne Address: [address]          [place], [place] [address] [place] Mobile Phone: [phone] Relation: Daughter Secondary Emergency Contact: Lewis,Anne Mobile Phone: [phone] Relation: Daughter Preferred language: English Interpreter needed? No  Code Status:  Full Code  Goals of care: Advanced Directive information    03/18/2024   12:48 PM  Advanced Directives  Does Patient Have a Medical Advance Directive? No  Would patient like information on creating a medical advance directive? No - Patient declined     Chief Complaint  Patient presents with   Raised area on lip.    Discussed the use of AI scribe software for clinical note transcription with the patient, who gave verbal consent to proceed.  History of Present Illness   Anne Lewis is a 58 year old female who presents with a recent seizure and rash on her lower lip.  She experienced a seizure on Friday but did not seek emergency care. A CT scan in July showed no intracranial abnormalities. She has not reported any recent issues with her vision, and her blood sugars have  been stable.  She has a rash on her lower lip that began as a blister, accompanied by a burning sensation. No other blisters are present inside her mouth. She also reports numbness and a burning sensation on the right side of her face. She has received the shingles vaccine in the past.   Past Medical History:  Diagnosis Date   Allergy    anemia    Anemia    Anxiety    Aortic atherosclerosis (HCC)    Arthritis    B12 deficiency    Back pain    BRCA negative 11/2013   Chest pain    Constipation    Depression    Fatty liver    Gallbladder problem    GERD (gastroesophageal reflux disease)    H pylori ulcer    Heartburn    Joint pain    Migraines    Palpitation    Pre-diabetes    Seizures (HCC)    3 YEARS AGO UPDATED 11/07/22   sleep apnea    Stomach ulcer    Ulcer of gastric fundus    Vitamin D  deficiency    Past Surgical History:  Procedure Laterality Date   CESAREAN SECTION  8004,8001   COLONOSCOPY     ENDOMETRIAL CRYOABLATION  07/10/2011   HER OPTION - office performed   LAPAROSCOPIC APPENDECTOMY N/A 09/03/2021   Procedure: APPENDECTOMY LAPAROSCOPIC;  Surgeon: Rubin Calamity, MD;  Location: [facility];  Service: General;  Laterality: N/A;   TONSILLECTOMY  07/22/1985    Allergies  Allergen Reactions   Aspirin Swelling and Other (See Comments)    Bleeding   Augmentin [Amoxicillin -Pot Clavulanate] Swelling   Oxycodone  Nausea And Vomiting    Outpatient Encounter Medications as of 04/02/2024  Medication Sig   doxycycline  (ADOXA) 100 MG tablet Take 100 mg by mouth 2 (two) times daily.   polyethylene glycol (MIRALAX) 17 g packet Take 17 g by mouth daily.   valACYclovir  (VALTREX ) 1000 MG tablet Take 1 tablet (1,000 mg total) by mouth 2 (two) times daily for 7 days.   famotidine  (PEPCID ) 20 MG tablet TAKE 1 TABLET BY MOUTH TWICE A DAY (Patient not taking: Reported on 04/02/2024)   hydrOXYzine  (ATARAX ) 10 MG tablet Take 1 tablet (10 mg total) by mouth 3 (three) times daily  as needed for anxiety or itching. (Patient not taking: Reported on 04/02/2024)   metroNIDAZOLE  (FLAGYL ) 500 MG tablet Take 1 tablet (500 mg total) by mouth 3 (three) times daily for 14 days. (Patient not taking: Reported on 04/02/2024)   ondansetron  (ZOFRAN -ODT) 4 MG disintegrating tablet Take 1 tablet (4 mg total) by mouth every 8 (eight) hours as needed. (Patient not taking: Reported on 04/02/2024)   pantoprazole  (PROTONIX ) 40 MG tablet Take 1 tablet (40 mg total) by mouth 2 (two) times daily for 14 days. (Patient not taking: Reported on 04/02/2024)   tetracycline  (SUMYCIN ) 500 MG capsule Take 1 capsule (500 mg total) by mouth 4 (four) times daily for 14 days. (Patient not taking: Reported on 04/02/2024)   No facility-administered encounter medications on file as of 04/02/2024.    Review of Systems  Constitutional:  Negative for appetite change, chills, fatigue, fever and unexpected weight change.  HENT:  Negative for congestion, ear discharge, ear pain, facial swelling, hearing loss, nosebleeds, postnasal drip, rhinorrhea, sinus pressure, sinus pain, sneezing, sore throat, tinnitus and trouble swallowing.   Eyes:  Negative for pain, discharge, redness, itching and visual disturbance.  Respiratory:  Negative for cough, chest tightness, shortness of breath and wheezing.   Cardiovascular:  Negative for chest pain, palpitations and leg swelling.  Gastrointestinal:  Negative for abdominal distention, abdominal pain, blood in stool, constipation, diarrhea, nausea and vomiting.  Endocrine: Negative for cold intolerance, heat intolerance, polydipsia, polyphagia and polyuria.  Genitourinary:  Negative for difficulty urinating, dysuria, flank pain, frequency and urgency.  Musculoskeletal:  Negative for arthralgias, back pain, gait problem, joint swelling, myalgias, neck pain and neck stiffness.  Skin:  Negative for color change, pallor and rash.       Lower lip rash  Neurological:  Negative for dizziness,  syncope, speech difficulty, weakness, light-headedness, numbness and headaches.       Reports x 3 episodes of seizure but declined ED for evaluation Had numbness and burning sensation on right face side     Immunization History  Administered Date(s) Administered   Influenza, Seasonal, Injecte, Preservative Fre 04/29/2023   Influenza,inj,Quad PF,6+ Mos 05/16/2021   Influenza-Unspecified 06/30/2011   Moderna Sars-Covid-2 Vaccination 01/31/2021, 03/03/2021   PFIZER Comirnaty(Gray Top)Covid-19 Tri-Sucrose Vaccine 01/20/2020, 02/21/2020   Rabies, IM 01/03/2018, 01/06/2018, 01/10/2018, 01/17/2018   Tdap 01/03/2018   Zoster Recombinant(Shingrix) 05/16/2021   Pertinent  Health Maintenance Due  Topic Date Due   Influenza Vaccine  02/20/2024   Mammogram  03/26/2026   Colonoscopy  Discontinued      02/05/2023    8:48 AM 04/29/2023    1:38 PM 11/10/2023    3:13 PM 02/05/2024    3:53 PM 03/18/2024   12:46  PM  Fall Risk  Falls in the past year? 0 1 1  1   Was there an injury with Fall? 0 1 0 1 1  Fall Risk Category Calculator 0 2 2  2   Patient at Risk for Falls Due to No Fall Risks  No Fall Risks Other (Comment) No Fall Risks;History of fall(s)  Patient at Risk for Falls Due to - Comments    Pt lost consciousness and fell and cracked her head open   Fall risk Follow up Falls evaluation completed  Falls evaluation completed Falls evaluation completed Falls evaluation completed   Functional Status Survey:    There were no vitals filed for this visit. There is no height or weight on file to calculate BMI. Physical Exam Constitutional:      General: She is not in acute distress.    Appearance: She is not ill-appearing.  Pulmonary:     Effort: Pulmonary effort is normal. No respiratory distress.  Skin:    Comments: Yellow crust rash on mid lower lip   Neurological:     Mental Status: She is alert and oriented to person, place, and time.     Gait: Gait normal.      Labs  reviewed: Recent Labs    04/10/23 1124 07/25/23 0910 11/05/23 2035 12/09/23 2226 02/11/24 2133 02/11/24 2135 03/18/24 1406  NA 140 137   < > 138 140 139 136  K 4.6 4.5   < > 3.9 4.3 4.0 4.3  CL 100 104   < > 103 102 103 100  CO2 23 24   < > 25 28  --  28  GLUCOSE 75 92   < > 111* 130* 121* 85  BUN 25* 28*   < > 31* 24* 23* 28*  CREATININE 0.89 0.88   < > 0.94 0.87 0.90 0.85  CALCIUM 10.1 10.1   < > 10.1 10.1  --  10.4  MG 2.2 2.1  --   --   --   --   --   PHOS 4.5*  --   --   --   --   --   --    < > = values in this interval not displayed.   Recent Labs    11/05/23 2035 11/10/23 1548 12/09/23 2226 03/18/24 1406  AST 19 16 31 15   ALT 16 13 27 13   ALKPHOS 66 51 60  --   BILITOT 0.6 0.4 0.8 0.4  PROT 8.1 7.3 7.8 7.7  ALBUMIN 4.8 4.6 4.5  --    Recent Labs    12/09/23 2226 02/11/24 2133 02/11/24 2135 03/18/24 1406  WBC 7.7 6.3  --  6.0  NEUTROABS 4.4 2.6  --  3,246  HGB 13.7 13.4 14.3 14.0  HCT 41.8 40.7 42.0 42.6  MCV 91.5 92.9  --  93.2  PLT 347 337  --  315   Lab Results  Component Value Date   TSH 0.46 03/18/2024   Lab Results  Component Value Date   HGBA1C 5.3 03/18/2024   Lab Results  Component Value Date   CHOL 171 03/18/2024   HDL 55 03/18/2024   LDLCALC 95 03/18/2024   LDLDIRECT 156.0 06/18/2021   TRIG 109 03/18/2024   CHOLHDL 3.1 03/18/2024    Significant Diagnostic Results in last 30 days:  MM 3D SCREENING MAMMOGRAM BILATERAL BREAST Result Date: 03/31/2024 CLINICAL DATA:  Screening. EXAM: DIGITAL SCREENING BILATERAL MAMMOGRAM WITH TOMOSYNTHESIS AND CAD TECHNIQUE: Bilateral screening digital craniocaudal and mediolateral  oblique mammograms were obtained. Bilateral screening digital breast tomosynthesis was performed. The images were evaluated with computer-aided detection. COMPARISON:  Previous exam(s). ACR Breast Density Category c: The breasts are heterogeneously dense, which may obscure small masses. FINDINGS: There are no findings  suspicious for malignancy. IMPRESSION: No mammographic evidence of malignancy. A result letter of this screening mammogram will be mailed directly to the patient. RECOMMENDATION: Screening mammogram in one year. (Code:SM-B-01Y) BI-RADS CATEGORY  1: Negative. Electronically Signed   By: Alm Parkins M.D.   On: 03/31/2024 09:48    Assessment/Plan  Seizure disorder Recent seizure episode on Friday without emergency room visit. Previous CT scan in July showed no intracranial abnormalities. Blood glucose levels are normal. - Advise emergency room visit if seizures occur back to back for further evaluation.  Right facial numbness and burning Right facial numbness and burning sensation possibly related to shingles or herpes labialis. She has received the shingles vaccine. - Prescribe valacyclovir  (Valtrex ) 1 tablet in the morning and 1 in the evening for 7 days.  Herpes labialis (cold sore) Rash on lower lip began as a blister, consistent with herpes labialis. No other blisters inside the mouth. Right facial numbness and burning sensation possibly related to shingles or herpes labialis. She has received the shingles vaccine. - Prescribe valacyclovir  (Valtrex ) 1 tablet in the morning and 1 in the evening for 7 days. - Advise not to pick on the cold sore and use over-the-counter cold sore medication to prevent dryness.  Helicobacter pylori infection H. pylori infection present, requiring antibiotic treatment to address infection and associated stomach issues. - Continue antibiotics for H. pylori infection. - Advise to increase water intake while on antibiotics.  Lymphadenopathy Lymphadenopathy present. Hematologist recommended infectious disease evaluation before further hematological assessment. - Ensure appointment with infectious disease specialist on September 24th.   Family/ staff Communication: Reviewed plan of care with patient verbalized understanding   Labs/tests ordered: None   Next  Appointment: No follow-ups on file.  I connected with  Anne Lewis on 04/02/24 by a video enabled telemedicine application and verified that I am speaking with the correct person using two identifiers.   I discussed the limitations of evaluation and management by telemedicine. The patient expressed understanding and agreed to proceed.  Spent 20 minutes of non-face to face with patient  >50% time spent counseling; reviewing medical record; tests; labs; and developing future plan of care.   Anne JAYSON Plough, NP

## 2024-04-07 ENCOUNTER — Telehealth: Payer: Self-pay

## 2024-04-07 NOTE — Telephone Encounter (Signed)
 Copied from CRM [id number]. Topic: Referral - Status >> Mar 30, 2024  3:09 PM Mercer PEDLAR wrote: Reason for CRM: Colene calling from cancer center regarding oncology referral, she is calling to notify office that the referral will be closed due to patient having lyme disease.  Callback: [phone] >> Apr 07, 2024  3:36 PM Miquel SAILOR wrote: Colene  from Cancer center/  [phone]  Ambulatory referral to Infectious Disease (Order # [id number]) on 03/25/2024-She stated PT would have to do Infectious disease first and take antibiotic prescribed first before doing anything with Cancer center. If medication does not help then they can do the nest step. Request to contact PT and let her know. [phone]

## 2024-04-07 NOTE — Telephone Encounter (Signed)
 Patient concern resolved.

## 2024-04-07 NOTE — Telephone Encounter (Signed)
 MyChart message sent to patient as notification.

## 2024-04-07 NOTE — Telephone Encounter (Signed)
 Noted

## 2024-04-14 ENCOUNTER — Other Ambulatory Visit: Payer: Self-pay

## 2024-04-14 ENCOUNTER — Ambulatory Visit (INDEPENDENT_AMBULATORY_CARE_PROVIDER_SITE_OTHER): Admitting: Internal Medicine

## 2024-04-14 VITALS — BP 112/73 | HR 76 | Temp 99.0°F | Resp 16 | Wt 118.0 lb

## 2024-04-14 DIAGNOSIS — B9681 Helicobacter pylori [H. pylori] as the cause of diseases classified elsewhere: Secondary | ICD-10-CM

## 2024-04-14 DIAGNOSIS — G8929 Other chronic pain: Secondary | ICD-10-CM

## 2024-04-14 DIAGNOSIS — L259 Unspecified contact dermatitis, unspecified cause: Secondary | ICD-10-CM

## 2024-04-14 DIAGNOSIS — R21 Rash and other nonspecific skin eruption: Secondary | ICD-10-CM | POA: Diagnosis not present

## 2024-04-14 NOTE — Progress Notes (Signed)
 Patient: Anne Lewis  DOB: 1966/04/13 MRN: [id number] PCP: Anne Roxan BROCKS, NP    Chief Complaint  Patient presents with   New Patient (Initial Visit)    Lyme disease     Patient Active Problem List   Diagnosis Date Noted   Gastroesophageal reflux disease without esophagitis 03/18/2024   Right lower quadrant pain 04/10/2023   Protein-calorie malnutrition 04/10/2023   Pharyngeal dysphagia 04/10/2023   GI problem 02/12/2023   BMI 20.0-20.9, adult 10/22/2022   Gastroesophageal reflux disease with esophagitis without hemorrhage 08/08/2022   Elevated blood pressure reading without diagnosis of hypertension 08/08/2022   Nausea and vomiting in adult 04/30/2022   Depression 04/30/2022   Generalized obesity 04/30/2022   Depression with anxiety 03/19/2022   Facial cellulitis 01/24/2022   Ecchymosis 12/11/2021   Rash 12/11/2021   Pre-diabetes 12/11/2021   Acute appendicitis 09/03/2021   Perimenopausal 06/18/2021   Vitamin D  deficiency 06/18/2021   Intractable headache 06/18/2021   Sciatica of right side 01/11/2021   Paresthesias 08/30/2019   Weight gain 08/30/2019   Dysthymia 08/30/2019   Reactive airway disease 10/06/2018   Osteoarthritis of spine with radiculopathy, cervical region 04/16/2018   Urticaria 04/16/2018   Tooth pain 04/16/2018   Acute non-recurrent sinusitis 08/19/2017   Lateral epicondylitis of left elbow 08/19/2017   Cough 08/19/2017   Healthcare maintenance 08/19/2017   Family history of malignant neoplasm of breast 11/08/2013   Family history of malignant neoplasm of gastrointestinal tract 11/08/2013   Family history of malignant neoplasm of ovary 11/08/2013   RUQ PAIN 08/08/2010   DYSPEPSIA&OTHER SPEC DISORDERS FUNCTION STOMACH 08/07/2010   ABDOMINAL PAIN, EPIGASTRIC 08/07/2010   Chronic constipation 12/05/2009     Subjective:    Today : Discussed the use of AI scribe software for clinical note transcription with the patient, who gave  verbal consent to proceed   Anne Lewis is a 58 year old female who presents with persistent pain, seizures, and lymphadenopathy for evaluation of possible Lyme disease. She was referred by her primary doctor for evaluation of possible Lyme disease.  Since October 2024, she has experienced a progressive rash that began as small lesions on her back and expanded to cover most of her back by February. The rash is described as burning and painful, resembling urticaria, and is still painful with a sensation of being poked by needles. She has seen a dermatologist, and a biopsy was performed, but the rash remains painful and itchy.  In February, she noticed swelling of her lymph nodes, which are visibly swollen and have been persistent. A recommendation for a biopsy was made, but follow-up has been inconsistent.  She has experienced muscle pain, seizures, memory problems, and speech difficulties since October 2024. She fell and hit her head, leading to a neurologist consultation where she was told there was a concern for possible early dementia. Seizures and pain have increased over time, leading to multiple hospital visits.  She reports significant weight loss of over 90 pounds in less than a year despite eating, and she continues to lose weight. She has been on metformin  for prediabetes for several months.  She was tested for H. pylori, which was initially negative but recently turned positive. She is currently on a course of antibiotics for H. pylori, with seven days remaining. She has previously tested negative for HIV and hepatitis B and C.  She reports stiffness and pain in her neck, which she was told was swollen and had arthritis. She  experiences widespread pain in her muscles and joints, describing it as 'unbearable'.  No fevers but experiences chills and cold sweats, which she attributes to low blood sugar. She has a history of being prediabetic.  Review of Systems  All other systems  reviewed and are negative.   Past Medical History:  Diagnosis Date   Allergy    anemia    Anemia    Anxiety    Aortic atherosclerosis    Arthritis    B12 deficiency    Back pain    BRCA negative 11/2013   Chest pain    Constipation    Depression    Fatty liver    Gallbladder problem    GERD (gastroesophageal reflux disease)    H pylori ulcer    Heartburn    Joint pain    Migraines    Palpitation    Pre-diabetes    Seizures (HCC)    3 YEARS AGO UPDATED 11/07/22   sleep apnea    Stomach ulcer    Ulcer of gastric fundus    Vitamin D  deficiency     Outpatient Medications Prior to Visit  Medication Sig Dispense Refill   famotidine  (PEPCID ) 20 MG tablet TAKE 1 TABLET BY MOUTH TWICE A DAY 180 tablet 1   hydrOXYzine  (ATARAX ) 10 MG tablet Take 1 tablet (10 mg total) by mouth 3 (three) times daily as needed for anxiety or itching. 30 tablet 0   metroNIDAZOLE  (FLAGYL ) 500 MG tablet Take 500 mg by mouth 3 (three) times daily.     pantoprazole  (PROTONIX ) 40 MG tablet Take 1 tablet (40 mg total) by mouth 2 (two) times daily for 14 days. 28 tablet 0   polyethylene glycol (MIRALAX) 17 g packet Take 17 g by mouth daily.     valACYclovir  (VALTREX ) 500 MG tablet Take 1,000 mg by mouth 2 (two) times daily.     ondansetron  (ZOFRAN -ODT) 4 MG disintegrating tablet Take 1 tablet (4 mg total) by mouth every 8 (eight) hours as needed. 20 tablet 0   doxycycline  (ADOXA) 100 MG tablet Take 100 mg by mouth 2 (two) times daily.     No facility-administered medications prior to visit.     Allergies  Allergen Reactions   Aspirin Swelling and Other (See Comments)    Bleeding   Augmentin [Amoxicillin -Pot Clavulanate] Swelling   Oxycodone  Nausea And Vomiting    Social History   Tobacco Use   Smoking status: Some Days    Types: Cigarettes   Smokeless tobacco: Never  Vaping Use   Vaping status: Never Used  Substance Use Topics   Alcohol use: Yes    Comment: occ   Drug use: No    Family  History  Problem Relation Age of Onset   Colon polyps Mother    Colon cancer Mother    Diabetes Mother    Hypertension Mother    Cancer Mother 23       intestine and ovarian   Diverticulitis Mother    Depression Mother    Anxiety disorder Mother    Sleep apnea Mother    Stroke Father    Diabetes Father    Hypertension Father    Heart disease Father    Heart attack Father        x 2   Colon cancer Sister    Colon polyps Sister    Rectal cancer Sister    Cancer Sister 37       ovarian and intestine   Cancer  Brother 24       GI cancer   Breast cancer Maternal Aunt 32   Cancer Paternal Aunt 48       stomach   Cancer Maternal Grandmother 76       ovarian and pancreatic   Cancer Cousin 39       ovarian   Cancer Cousin 47       breast   Crohn's disease Neg Hx    Esophageal cancer Neg Hx    Stomach cancer Neg Hx    Ulcerative colitis Neg Hx     Objective:   Vitals:   04/14/24 1453 04/14/24 1457  BP:  112/73  Pulse:  76  Resp:  16  Temp:  99 F (37.2 C)  TempSrc:  Oral  SpO2:  100%  Weight: 118 lb (53.5 kg)    Body mass index is 18.21 kg/m.  Physical Exam Constitutional:      Appearance: Normal appearance.  HENT:     Head: Normocephalic and atraumatic.     Right Ear: Tympanic membrane normal.     Left Ear: Tympanic membrane normal.     Nose: Nose normal.     Mouth/Throat:     Mouth: Mucous membranes are moist.  Eyes:     Extraocular Movements: Extraocular movements intact.     Conjunctiva/sclera: Conjunctivae normal.     Pupils: Pupils are equal, round, and reactive to light.  Cardiovascular:     Rate and Rhythm: Normal rate and regular rhythm.     Heart sounds: No murmur heard.    No friction rub. No gallop.  Pulmonary:     Effort: Pulmonary effort is normal.     Breath sounds: Normal breath sounds.  Abdominal:     General: Abdomen is flat.     Palpations: Abdomen is soft.  Musculoskeletal:        General: Normal range of motion.  Skin:     General: Skin is warm and dry.  Neurological:     General: No focal deficit present.     Mental Status: She is alert and oriented to person, place, and time.  Psychiatric:        Mood and Affect: Mood normal.     Lab Results: Lab Results  Component Value Date   WBC 6.9 04/16/2024   HGB 12.7 04/16/2024   HCT 39.0 04/16/2024   MCV 91.5 04/16/2024   PLT 336 04/16/2024    Lab Results  Component Value Date   CREATININE 1.10 (H) 04/16/2024   BUN 27 (H) 04/16/2024   NA 141 04/16/2024   K 3.6 04/16/2024   CL 103 04/16/2024   CO2 23 04/16/2024    Lab Results  Component Value Date   ALT 54 (H) 04/16/2024   AST 90 (H) 04/16/2024   ALKPHOS 81 04/16/2024   BILITOT 0.4 04/16/2024     Assessment & Plan:  #lyme IG western blot #pain #Back rash sp biopsy on 03/16/24 c/w chronic spngiotic dermatitis -pt states pain everywhenre: muslcle, joint .  rash started in oct 2024. she started having seizures-. Resultin in fall followed by neurology. Noted enlarged ln -Only 2 bands positive as such not c/w acute lyme infection. She denies any fever or chills. Overall labs stable(hiv negative, hcv nab negative, 8/28 cbc and cmp stable) .  Her symptoms have been going on for almost a year beginning with rash. She was Rx about a week of doxy. No further treatment needed in regards to lyme  #hpylori  infection noted -on tetracycline , protoxin and metro x 2 weeks, manged by primary pt has  almost compelted abx. Generally given with bismuth -Test of cure pending, defer to primary    Loney Stank, MD [facility]   04/18/24  4:37 PM I have personally spent 100 minutes involved in face-to-face and non-face-to-face activities for this patient on the day of the visit. Professional time spent includes the following activities: Preparing to see the patient (review of tests), Obtaining and/or reviewing separately obtained history (admission/discharge  record), Performing a medically appropriate examination and/or evaluation , Ordering medications/tests/procedures, referring and communicating with other health care professionals, Documenting clinical information in the EMR, Independently interpreting results (not separately reported), Communicating results to the patient/family/caregiver, Counseling and educating the patient/family/caregiver and Care coordination (not separately reported).

## 2024-04-15 ENCOUNTER — Other Ambulatory Visit: Payer: Self-pay | Admitting: Family

## 2024-04-15 DIAGNOSIS — R591 Generalized enlarged lymph nodes: Secondary | ICD-10-CM

## 2024-04-15 NOTE — Telephone Encounter (Addendum)
 I tried to pend referral however this encounter would not allow me to route with a pended order

## 2024-04-16 ENCOUNTER — Emergency Department (HOSPITAL_COMMUNITY)

## 2024-04-16 ENCOUNTER — Other Ambulatory Visit: Payer: Self-pay

## 2024-04-16 ENCOUNTER — Emergency Department (HOSPITAL_COMMUNITY)
Admission: EM | Admit: 2024-04-16 | Discharge: 2024-04-17 | Disposition: A | Discharge status: Home / Self Care | Attending: Emergency Medicine | Admitting: Emergency Medicine

## 2024-04-16 DIAGNOSIS — R7401 Elevation of levels of liver transaminase levels: Secondary | ICD-10-CM | POA: Insufficient documentation

## 2024-04-16 DIAGNOSIS — R7303 Prediabetes: Secondary | ICD-10-CM | POA: Diagnosis not present

## 2024-04-16 DIAGNOSIS — R1010 Upper abdominal pain, unspecified: Secondary | ICD-10-CM

## 2024-04-16 DIAGNOSIS — R1011 Right upper quadrant pain: Secondary | ICD-10-CM | POA: Diagnosis present

## 2024-04-16 DIAGNOSIS — K2960 Other gastritis without bleeding: Secondary | ICD-10-CM | POA: Insufficient documentation

## 2024-04-16 DIAGNOSIS — K2900 Acute gastritis without bleeding: Secondary | ICD-10-CM

## 2024-04-16 LAB — LIPASE, BLOOD: Lipase: 63 U/L — ABNORMAL HIGH (ref 11–51)

## 2024-04-16 LAB — COMPREHENSIVE METABOLIC PANEL WITH GFR
ALT: 54 U/L — ABNORMAL HIGH (ref 0–44)
AST: 90 U/L — ABNORMAL HIGH (ref 15–41)
Albumin: 4.5 g/dL (ref 3.5–5.0)
Alkaline Phosphatase: 81 U/L (ref 38–126)
Anion gap: 15 (ref 5–15)
BUN: 27 mg/dL — ABNORMAL HIGH (ref 6–20)
CO2: 23 mmol/L (ref 22–32)
Calcium: 10.2 mg/dL (ref 8.9–10.3)
Chloride: 103 mmol/L (ref 98–111)
Creatinine, Ser: 1.1 mg/dL — ABNORMAL HIGH (ref 0.44–1.00)
GFR, Estimated: 58 mL/min — ABNORMAL LOW (ref >60)
Glucose, Bld: 137 mg/dL — ABNORMAL HIGH (ref 70–99)
Potassium: 3.6 mmol/L (ref 3.5–5.1)
Sodium: 141 mmol/L (ref 135–145)
Total Bilirubin: 0.4 mg/dL (ref 0.0–1.2)
Total Protein: 6.8 g/dL (ref 6.5–8.1)

## 2024-04-16 LAB — CBC
HCT: 39 % (ref 36.0–46.0)
Hemoglobin: 12.7 g/dL (ref 12.0–15.0)
MCH: 29.8 pg (ref 26.0–34.0)
MCHC: 32.6 g/dL (ref 30.0–36.0)
MCV: 91.5 fL (ref 80.0–100.0)
Platelets: 336 K/uL (ref 150–400)
RBC: 4.26 MIL/uL (ref 3.87–5.11)
RDW: 13 % (ref 11.5–15.5)
WBC: 6.9 K/uL (ref 4.0–10.5)
nRBC: 0 % (ref 0.0–0.2)

## 2024-04-16 MED ORDER — HYDROMORPHONE HCL 1 MG/ML IJ SOLN
0.5000 mg | Freq: Once | INTRAMUSCULAR | Status: AC
  Administered 2024-04-16: 0.5 mg via INTRAVENOUS
  Filled 2024-04-16: qty 1

## 2024-04-16 MED ORDER — LIDOCAINE VISCOUS HCL 2 % MT SOLN
15.0000 mL | Freq: Once | OROMUCOSAL | Status: AC
Start: 2024-04-16 — End: 2024-04-16
  Administered 2024-04-16: 15 mL via ORAL
  Filled 2024-04-16: qty 15

## 2024-04-16 MED ORDER — ALUM & MAG HYDROXIDE-SIMETH 200-200-20 MG/5ML PO SUSP
30.0000 mL | Freq: Once | ORAL | Status: AC
Start: 2024-04-16 — End: 2024-04-16
  Administered 2024-04-16: 30 mL via ORAL
  Filled 2024-04-16: qty 30

## 2024-04-16 MED ORDER — SODIUM CHLORIDE 0.9 % IV BOLUS
500.0000 mL | Freq: Once | INTRAVENOUS | Status: AC
  Administered 2024-04-16: 500 mL via INTRAVENOUS

## 2024-04-16 MED ORDER — PANTOPRAZOLE SODIUM 40 MG IV SOLR
40.0000 mg | Freq: Once | INTRAVENOUS | Status: AC
Start: 2024-04-16 — End: 2024-04-16
  Administered 2024-04-16: 40 mg via INTRAVENOUS
  Filled 2024-04-16: qty 10

## 2024-04-16 NOTE — ED Provider Notes (Signed)
 [place] EMERGENCY DEPARTMENT AT [facility] Provider Note  CSN: [id number] Arrival date & time: 04/16/24 2213  Chief Complaint(s) Abdominal Pain  HPI Anne Lewis is a 58 y.o. female with a past medical history listed below including recent treatment for Lyme disease who completed course of doxycycline  which subsequently tested positive for H. pylori and currently being treated for that presents to the emergency department with 2 days of worsening epigastric and right upper quadrant abdominal pain.  Worse with eating.  Endorsed nausea and nonbloody nonbilious emesis.  No alleviating factors.  No fevers or chills.  No cough or congestion.  No chest pain or shortness of breath.   Patient has had H. pylori in the past resulting in ulcers.  Last EGD was in 2023 showing esophagitis and gastritis.  The history is provided by the patient.    Past Medical History Past Medical History:  Diagnosis Date   Allergy    anemia    Anemia    Anxiety    Aortic atherosclerosis    Arthritis    B12 deficiency    Back pain    BRCA negative 11/2013   Chest pain    Constipation    Depression    Fatty liver    Gallbladder problem    GERD (gastroesophageal reflux disease)    H pylori ulcer    Heartburn    Joint pain    Migraines    Palpitation    Pre-diabetes    Seizures (HCC)    3 YEARS AGO UPDATED 11/07/22   sleep apnea    Stomach ulcer    Ulcer of gastric fundus    Vitamin D  deficiency    Patient Active Problem List   Diagnosis Date Noted   Gastroesophageal reflux disease without esophagitis 03/18/2024   Right lower quadrant pain 04/10/2023   Protein-calorie malnutrition 04/10/2023   Pharyngeal dysphagia 04/10/2023   GI problem 02/12/2023   BMI 20.0-20.9, adult 10/22/2022   Gastroesophageal reflux disease with esophagitis without hemorrhage 08/08/2022   Elevated blood pressure reading without diagnosis of hypertension 08/08/2022   Nausea and vomiting in adult  04/30/2022   Depression 04/30/2022   Generalized obesity 04/30/2022   Depression with anxiety 03/19/2022   Facial cellulitis 01/24/2022   Ecchymosis 12/11/2021   Rash 12/11/2021   Pre-diabetes 12/11/2021   Acute appendicitis 09/03/2021   Perimenopausal 06/18/2021   Vitamin D  deficiency 06/18/2021   Intractable headache 06/18/2021   Sciatica of right side 01/11/2021   Paresthesias 08/30/2019   Weight gain 08/30/2019   Dysthymia 08/30/2019   Reactive airway disease 10/06/2018   Osteoarthritis of spine with radiculopathy, cervical region 04/16/2018   Urticaria 04/16/2018   Tooth pain 04/16/2018   Acute non-recurrent sinusitis 08/19/2017   Lateral epicondylitis of left elbow 08/19/2017   Cough 08/19/2017   Healthcare maintenance 08/19/2017   Family history of malignant neoplasm of breast 11/08/2013   Family history of malignant neoplasm of gastrointestinal tract 11/08/2013   Family history of malignant neoplasm of ovary 11/08/2013   RUQ PAIN 08/08/2010   DYSPEPSIA&OTHER SPEC DISORDERS FUNCTION STOMACH 08/07/2010   ABDOMINAL PAIN, EPIGASTRIC 08/07/2010   Chronic constipation 12/05/2009   Home Medication(s) Prior to Admission medications   Medication Sig Start Date End Date Taking? Authorizing Provider  hyoscyamine  (LEVSIN  SL) 0.125 MG SL tablet Place 1 tablet (0.125 mg total) under the tongue every 6 (six) hours as needed for up to 8 days. 04/17/24 04/25/24 Yes Adolphe Fortunato, Raynell Moder, MD  ondansetron  (ZOFRAN -ODT) 4  MG disintegrating tablet Take 1 tablet (4 mg total) by mouth every 8 (eight) hours as needed for up to 3 days for nausea or vomiting. 04/17/24 04/20/24 Yes Cinde Ebert, Raynell Moder, MD  sucralfate (CARAFATE) 1 GM/10ML suspension Take 10 mLs (1 g total) by mouth 4 (four) times daily -  with meals and at bedtime. 04/17/24  Yes Nonna Renninger, Raynell Moder, MD  doxycycline  (ADOXA) 100 MG tablet Take 100 mg by mouth 2 (two) times daily.    [provider]  famotidine  (PEPCID ) 20  MG tablet TAKE 1 TABLET BY MOUTH TWICE A DAY 04/02/24   Ngetich, Dinah C, NP  hydrOXYzine  (ATARAX ) 10 MG tablet Take 1 tablet (10 mg total) by mouth 3 (three) times daily as needed for anxiety or itching. 03/18/24   Ngetich, Dinah C, NP  metroNIDAZOLE  (FLAGYL ) 500 MG tablet Take 500 mg by mouth 3 (three) times daily.    [provider]  pantoprazole  (PROTONIX ) 40 MG tablet Take 1 tablet (40 mg total) by mouth 2 (two) times daily for 14 days. 03/30/24 04/14/24  Medina-Vargas, Monina C, NP  polyethylene glycol (MIRALAX) 17 g packet Take 17 g by mouth daily.    [provider]  valACYclovir  (VALTREX ) 500 MG tablet Take 1,000 mg by mouth 2 (two) times daily.    [provider]                                                                                                                                    Allergies Aspirin, Augmentin [amoxicillin -pot clavulanate], and Oxycodone   Review of Systems Review of Systems As noted in HPI  Physical Exam Vital Signs  I have reviewed the triage vital signs BP 119/66   Pulse 90   Temp 98.1 F (36.7 C)   Resp 16   LMP 03/14/2018   SpO2 98%   Physical Exam Vitals reviewed.  Constitutional:      General: She is not in acute distress.    Appearance: She is well-developed. She is not diaphoretic.  HENT:     Head: Normocephalic and atraumatic.     Right Ear: External ear normal.     Left Ear: External ear normal.     Nose: Nose normal.  Eyes:     General: No scleral icterus.    Conjunctiva/sclera: Conjunctivae normal.  Neck:     Trachea: Phonation normal.  Cardiovascular:     Rate and Rhythm: Normal rate and regular rhythm.  Pulmonary:     Effort: Pulmonary effort is normal. No respiratory distress.     Breath sounds: No stridor.  Abdominal:     General: There is no distension.     Tenderness: There is abdominal tenderness in the right upper quadrant, epigastric area and left upper quadrant.  Musculoskeletal:         General: Normal range of motion.     Cervical back: Normal range of motion.  Neurological:  Mental Status: She is alert and oriented to person, place, and time.  Psychiatric:        Behavior: Behavior normal.     ED Results and Treatments Labs (all labs ordered are listed, but only abnormal results are displayed) Labs Reviewed  LIPASE, BLOOD - Abnormal; Notable for the following components:      Result Value   Lipase 63 (*)    All other components within normal limits  COMPREHENSIVE METABOLIC PANEL WITH GFR - Abnormal; Notable for the following components:   Glucose, Bld 137 (*)    BUN 27 (*)    Creatinine, Ser 1.10 (*)    AST 90 (*)    ALT 54 (*)    GFR, Estimated 58 (*)    All other components within normal limits  URINALYSIS, ROUTINE W REFLEX MICROSCOPIC - Abnormal; Notable for the following components:   APPearance HAZY (*)    All other components within normal limits  CBC                                                                                                                         EKG  EKG Interpretation Date/Time:  Friday April 16 2024 22:32:33 EDT Ventricular Rate:  79 PR Interval:  151 QRS Duration:  94 QT Interval:  395 QTC Calculation: 453 R Axis:   29  Text Interpretation: Sinus rhythm Consider right atrial enlargement Borderline low voltage, extremity leads Confirmed by Trine Likes [phone] on 04/17/2024 3:26:14 AM       Radiology CT ABDOMEN PELVIS W CONTRAST Result Date: 04/17/2024 CLINICAL DATA:  Epigastric pain. EXAM: CT ABDOMEN AND PELVIS WITH CONTRAST TECHNIQUE: Multidetector CT imaging of the abdomen and pelvis was performed using the standard protocol following bolus administration of intravenous contrast. RADIATION DOSE REDUCTION: This exam was performed according to the departmental dose-optimization program which includes automated exposure control, adjustment of the mA and/or kV according to patient size and/or use of iterative  reconstruction technique. CONTRAST:  80mL OMNIPAQUE  IOHEXOL  300 MG/ML  SOLN COMPARISON:  April 17, 2024 (1:00 a.m.) FINDINGS: Lower chest: No acute abnormality. Hepatobiliary: There is diffuse fatty infiltration of the liver parenchyma. No focal liver abnormality is seen. No gallstones, gallbladder wall thickening, or biliary dilatation. Pancreas: Unremarkable. No pancreatic ductal dilatation or surrounding inflammatory changes. Spleen: Normal in size without focal abnormality. Adrenals/Urinary Tract: Adrenal glands are unremarkable. Kidneys are normal, without renal calculi, focal lesion, or hydronephrosis. Contrast is seen throughout the lumen of a poorly distended urinary bladder. Stomach/Bowel: Oral contrast is seen throughout the stomach which is within normal limits. The appendix is surgically absent. The duodenum and jejunum are opacified, while the ileum is poorly distended and unopacified and subsequently limited in evaluation. Fluid and air are seen throughout the large bowel. No evidence of bowel wall thickening, distention, or inflammatory changes. Vascular/Lymphatic: Aortic atherosclerosis. No enlarged abdominal or pelvic lymph nodes. Reproductive: Uterus and bilateral adnexa are unremarkable. Other: No abdominal wall hernia or abnormality. No  abdominopelvic ascites. Musculoskeletal: No acute or significant osseous findings. IMPRESSION: 1. Hepatic steatosis. 2. Aortic atherosclerosis. Electronically Signed   By: Suzen Dials M.D.   On: 04/17/2024 03:21   CT ABDOMEN PELVIS W CONTRAST Result Date: 04/17/2024 EXAM: CT ABDOMEN AND PELVIS WITH CONTRAST 04/17/2024 01:23:41 AM TECHNIQUE: CT of the abdomen and pelvis was performed with the administration of intravenous contrast. 80 mL iohexol  (OMNIPAQUE ) 300 MG/ML solution was administered. Multiplanar reformatted images are provided for review. Automated exposure control, iterative reconstruction, and/or weight-based adjustment of the mA/kV was  utilized to reduce the radiation dose to as low as reasonably achievable. COMPARISON: CT made 04/11/2024. CLINICAL HISTORY: Epigastric pain. FINDINGS: LOWER CHEST: No acute abnormality. LIVER: Hepatic steatosis. GALLBLADDER AND BILE DUCTS: Gallbladder is unremarkable. No biliary ductal dilatation. SPLEEN: No acute abnormality. PANCREAS: No acute abnormality. ADRENAL GLANDS: No acute abnormality. KIDNEYS, URETERS AND BLADDER: No stones in the kidneys or ureters. No hydronephrosis. No perinephric or periureteral stranding. Urinary bladder is unremarkable. GI AND BOWEL: Stomach demonstrates no acute abnormality. There is no bowel obstruction. PERITONEUM AND RETROPERITONEUM: No ascites. No free air. VASCULATURE: Aortic atherosclerotic calcification. Aorta is normal in caliber. LYMPH NODES: No lymphadenopathy. REPRODUCTIVE ORGANS: No acute abnormality. BONES AND SOFT TISSUES: No acute osseous abnormality. No focal soft tissue abnormality. IMPRESSION: 1. No acute findings in the abdomen or pelvis related to epigastric pain. 2. Hepatic steatosis. 3. This CT scan was performed without the oral contrast that was originally ordered. A repeat CT with IV and oral contrast can be performed at no additional charge. Electronically signed by: Norman Gatlin MD 04/17/2024 01:52 AM EDT RP Workstation: [id number]   US  Abdomen Limited RUQ (LIVER/GB) Result Date: 04/16/2024 CLINICAL DATA:  Abdominal pain with nausea and vomiting. EXAM: ULTRASOUND ABDOMEN LIMITED RIGHT UPPER QUADRANT COMPARISON:  None Available. FINDINGS: Gallbladder: A 6 mm non-mobile echogenic focus is seen along the nondependent wall of a moderately distended gallbladder. Flow is present within this area on color Doppler evaluation. There is no evidence of gallbladder wall thickening (2.2 mm). A mild amount of pericholecystic fluid is seen. While a positive sonographic Beverley sign is noted by sonographer, she also stated that the patient is extremely tender to  touch. Common bile duct: Diameter: 4.8 mm Liver: No focal lesion identified. Within normal limits in parenchymal echogenicity. Portal vein is patent on color Doppler imaging with normal direction of blood flow towards the liver. Other: None. IMPRESSION: 1. Findings suspicious for the presence of a small, likely benign gallbladder polyp. Correlation with six-month follow-up right upper quadrant ultrasound is recommended to confirm stability. This recommendation follows ACR consensus guidelines: White Paper of the ACR Incidental Findings Committee II on Gallbladder and Biliary Findings. J Am Coll Radiol 2013:;10:953-956. 2. Mild amount of pericholecystic fluid without additional findings to suggest the presence of acute cholecystitis. Further evaluation with abdomen and pelvis CT is recommended if clinical symptoms persist. Electronically Signed   By: Suzen Dials M.D.   On: 04/16/2024 23:52    Medications Ordered in ED Medications  HYDROmorphone  (DILAUDID ) injection 0.5 mg (0.5 mg Intravenous Given 04/16/24 2339)  alum & mag hydroxide-simeth (MAALOX/MYLANTA) 200-200-20 MG/5ML suspension 30 mL (30 mLs Oral Given 04/16/24 2339)    And  lidocaine  (XYLOCAINE ) 2 % viscous mouth solution 15 mL (15 mLs Oral Given 04/16/24 2339)  pantoprazole  (PROTONIX ) injection 40 mg (40 mg Intravenous Given 04/16/24 2338)  sodium chloride  0.9 % bolus 500 mL (0 mLs Intravenous Stopped 04/17/24 0114)  sodium chloride  0.9 % bolus 1,000  mL (1,000 mLs Intravenous New Bag/Given 04/17/24 0111)  sucralfate (CARAFATE) 1 GM/10ML suspension 1 g (1 g Oral Given 04/17/24 0110)  iohexol  (OMNIPAQUE ) 300 MG/ML solution 80 mL (80 mLs Intravenous Contrast Given 04/17/24 0112)  iohexol  (OMNIPAQUE ) 300 MG/ML solution 80 mL (80 mLs Intravenous Contrast Given 04/17/24 0255)   Procedures Procedures  (including critical care time) Medical Decision Making / ED Course   Medical Decision Making Amount and/or Complexity of Data Reviewed Labs:  ordered. Decision-making details documented in ED Course. Radiology: ordered and independent interpretation performed. Decision-making details documented in ED Course. ECG/medicine tests: ordered and independent interpretation performed. Decision-making details documented in ED Course.  Risk OTC drugs. Prescription drug management. Parenteral controlled substances. Decision regarding hospitalization.    Upper abdominal pain differential diagnosis considered  CBC without cytosis or anemia.  CMP without significant electrolyte derangements.  Mild renal insufficiency without overt AKI.  Hyperglycemia without DKA.  Mildly elevated transaminitis but no evidence of bili obstruction.  Mildly elevated lipase but not at consistent with acute pancreatitis.  Provided with IVF, dose of IV Dilaudid , Protonix  and GI cocktail.  Right upper quadrant ultrasound obtained.  Notable for gallbladder polyp.  No stones.  Questionable pericholecystic fluid but no leukocytosis concerning for acute cholecystitis.  CT scan ordered and negative for any acute intra-abdominal inflammatory/infectious process or bowel obstruction. - Pain improved with IV Protonix , GI cocktail and sucralfate.  He was given IV fluids.  Tolerated p.o.     Final Clinical Impression(s) / ED Diagnoses Final diagnoses:  Upper abdominal pain  Other acute gastritis without hemorrhage   The patient appears reasonably screened and/or stabilized for discharge and I doubt any other medical condition or other [facility] requiring further screening, evaluation, or treatment in the ED at this time. I have discussed the findings, Dx and Tx plan with the patient/family who expressed understanding and agree(s) with the plan. Discharge instructions discussed at length. The patient/family was given strict return precautions who verbalized understanding of the instructions. No further questions at time of discharge.  Disposition: Discharge  Condition:  Good  ED Discharge Orders          Ordered    sucralfate (CARAFATE) 1 GM/10ML suspension  3 times daily with meals & bedtime        04/17/24 0400    hyoscyamine  (LEVSIN  SL) 0.125 MG SL tablet  Every 6 hours PRN        04/17/24 0400    ondansetron  (ZOFRAN -ODT) 4 MG disintegrating tablet  Every 8 hours PRN        04/17/24 0400              Follow Up: Ngetich, Dinah C, NP [address] [place] [address] [phone]  Call  to schedule an appointment for close follow up    This chart was dictated using voice recognition software.  Despite best efforts to proofread,  errors can occur which can change the documentation meaning.    Trine Raynell Moder, MD 04/17/24 [phone]

## 2024-04-16 NOTE — ED Triage Notes (Signed)
 Patient c/o RUQ abdominal pain x 2 days. Patient report worsening pain this afternoon. Patient report nausea and vomiting x 2 yesteday.

## 2024-04-17 ENCOUNTER — Emergency Department (HOSPITAL_COMMUNITY)

## 2024-04-17 LAB — URINALYSIS, ROUTINE W REFLEX MICROSCOPIC
Bilirubin Urine: NEGATIVE
Glucose, UA: NEGATIVE mg/dL
Hgb urine dipstick: NEGATIVE
Ketones, ur: NEGATIVE mg/dL
Leukocytes,Ua: NEGATIVE
Nitrite: NEGATIVE
Protein, ur: NEGATIVE mg/dL
Specific Gravity, Urine: 1.019 (ref 1.005–1.030)
pH: 7 (ref 5.0–8.0)

## 2024-04-17 MED ORDER — SUCRALFATE 1 GM/10ML PO SUSP
1.0000 g | Freq: Once | ORAL | Status: AC
  Administered 2024-04-17: 1 g via ORAL
  Filled 2024-04-17: qty 10

## 2024-04-17 MED ORDER — HYOSCYAMINE SULFATE 0.125 MG SL SUBL: 0.1250 mg | SUBLINGUAL_TABLET | Freq: Four times a day (QID) | SUBLINGUAL | 0 refills | Status: AC | PRN

## 2024-04-17 MED ORDER — IOHEXOL 300 MG/ML  SOLN
80.0000 mL | Freq: Once | INTRAMUSCULAR | Status: AC | PRN
  Administered 2024-04-17: 80 mL via INTRAVENOUS

## 2024-04-17 MED ORDER — SUCRALFATE 1 GM/10ML PO SUSP: 1.0000 g | Freq: Three times a day (TID) | ORAL | 0 refills | Status: DC

## 2024-04-17 MED ORDER — ONDANSETRON 4 MG PO TBDP: 4.0000 mg | ORAL_TABLET | Freq: Three times a day (TID) | ORAL | 0 refills | Status: AC | PRN

## 2024-04-17 MED ORDER — IOHEXOL 300 MG/ML  SOLN
80.0000 mL | Freq: Once | INTRAMUSCULAR | Status: AC | PRN
Start: 2024-04-17 — End: 2024-04-17
  Administered 2024-04-17: 80 mL via INTRAVENOUS

## 2024-04-17 MED ORDER — SODIUM CHLORIDE 0.9 % IV BOLUS
1000.0000 mL | Freq: Once | INTRAVENOUS | Status: AC
  Administered 2024-04-17: 1000 mL via INTRAVENOUS

## 2024-04-17 NOTE — Discharge Instructions (Addendum)
 During the workup we noted incidental findings on your imaging that would require you to follow-up with your regular doctor for further evaluation/management:  1. Findings suspicious for the presence of a small, likely benign gallbladder polyp. Correlation with six-month follow-up right upper quadrant ultrasound is recommended to confirm stability. This recommendation follows ACR consensus guidelines: White Paper of the ACR Incidental Findings Committee II on Gallbladder and Biliary Findings. J Am Coll Radiol 2013:;10:953-956

## 2024-04-20 ENCOUNTER — Encounter: Payer: Self-pay | Admitting: Family

## 2024-04-20 ENCOUNTER — Ambulatory Visit (INDEPENDENT_AMBULATORY_CARE_PROVIDER_SITE_OTHER): Admitting: Family

## 2024-04-20 VITALS — BP 122/70 | HR 60 | Temp 97.6°F | Resp 19 | Ht 67.5 in | Wt 122.8 lb

## 2024-04-20 DIAGNOSIS — K760 Fatty (change of) liver, not elsewhere classified: Secondary | ICD-10-CM

## 2024-04-20 DIAGNOSIS — R1010 Upper abdominal pain, unspecified: Secondary | ICD-10-CM

## 2024-04-20 DIAGNOSIS — R5383 Other fatigue: Secondary | ICD-10-CM | POA: Diagnosis not present

## 2024-04-20 DIAGNOSIS — R300 Dysuria: Secondary | ICD-10-CM | POA: Diagnosis not present

## 2024-04-20 DIAGNOSIS — R591 Generalized enlarged lymph nodes: Secondary | ICD-10-CM

## 2024-04-21 ENCOUNTER — Other Ambulatory Visit

## 2024-04-21 LAB — CBC WITH DIFFERENTIAL/PLATELET
Absolute Lymphocytes: 2144 {cells}/uL (ref 850–3900)
Absolute Monocytes: 461 {cells}/uL (ref 200–950)
Basophils Absolute: 58 {cells}/uL (ref 0–200)
Basophils Relative: 0.9 %
Eosinophils Absolute: 179 {cells}/uL (ref 15–500)
Eosinophils Relative: 2.8 %
HCT: 38.4 % (ref 35.0–45.0)
Hemoglobin: 12.6 g/dL (ref 11.7–15.5)
MCH: 30.6 pg (ref 27.0–33.0)
MCHC: 32.8 g/dL (ref 32.0–36.0)
MCV: 93.2 fL (ref 80.0–100.0)
MPV: 10.3 fL (ref 7.5–12.5)
Monocytes Relative: 7.2 %
Neutro Abs: 3558 {cells}/uL (ref 1500–7800)
Neutrophils Relative %: 55.6 %
Platelets: 340 Thousand/uL (ref 140–400)
RBC: 4.12 Million/uL (ref 3.80–5.10)
RDW: 12.9 % (ref 11.0–15.0)
Total Lymphocyte: 33.5 %
WBC: 6.4 Thousand/uL (ref 3.8–10.8)

## 2024-04-21 LAB — COMPREHENSIVE METABOLIC PANEL WITH GFR
AG Ratio: 2 (calc) (ref 1.0–2.5)
ALT: 58 U/L — ABNORMAL HIGH (ref 6–29)
AST: 22 U/L (ref 10–35)
Albumin: 4.4 g/dL (ref 3.6–5.1)
Alkaline phosphatase (APISO): 73 U/L (ref 37–153)
BUN/Creatinine Ratio: 27 (calc) — ABNORMAL HIGH (ref 6–22)
BUN: 26 mg/dL — ABNORMAL HIGH (ref 7–25)
CO2: 28 mmol/L (ref 20–32)
Calcium: 10.2 mg/dL (ref 8.6–10.4)
Chloride: 102 mmol/L (ref 98–110)
Creat: 0.98 mg/dL (ref 0.50–1.03)
Globulin: 2.2 g/dL (ref 1.9–3.7)
Glucose, Bld: 83 mg/dL (ref 65–99)
Potassium: 4.6 mmol/L (ref 3.5–5.3)
Sodium: 140 mmol/L (ref 135–146)
Total Bilirubin: 0.5 mg/dL (ref 0.2–1.2)
Total Protein: 6.6 g/dL (ref 6.1–8.1)
eGFR: 67 mL/min/1.73m2 (ref >=60)

## 2024-04-22 LAB — URINE CULTURE
MICRO NUMBER:: 17042927
Result:: NO GROWTH
SPECIMEN QUALITY:: ADEQUATE

## 2024-04-23 ENCOUNTER — Ambulatory Visit: Payer: Self-pay | Admitting: Family

## 2024-04-23 ENCOUNTER — Telehealth: Payer: Self-pay

## 2024-04-23 DIAGNOSIS — K760 Fatty (change of) liver, not elsewhere classified: Secondary | ICD-10-CM

## 2024-04-23 DIAGNOSIS — R1010 Upper abdominal pain, unspecified: Secondary | ICD-10-CM

## 2024-04-23 NOTE — Telephone Encounter (Signed)
 Copied from CRM [id number]. Topic: Clinical - Medication Prior Auth >> Apr 23, 2024  1:43 PM Debby BROCKS wrote: Reason for CRM: Lolita from [facility] called stating that the Patient's insurance needs a Prior Authorization for the appointment ju:[id number] on October 8th for the CT scan as they do not have one on file  [phone] Ext: [phone]

## 2024-04-28 ENCOUNTER — Ambulatory Visit (HOSPITAL_COMMUNITY)
Admission: RE | Admit: 2024-04-28 | Discharge: 2024-04-28 | Disposition: A | Discharge status: Home / Self Care | Source: Ambulatory Visit | Attending: Family | Admitting: Family

## 2024-04-28 DIAGNOSIS — R591 Generalized enlarged lymph nodes: Secondary | ICD-10-CM | POA: Insufficient documentation

## 2024-04-29 ENCOUNTER — Other Ambulatory Visit: Payer: Self-pay | Admitting: Family

## 2024-04-29 DIAGNOSIS — R1013 Epigastric pain: Secondary | ICD-10-CM

## 2024-05-02 NOTE — Progress Notes (Signed)
 Provider: Roxan Plough FNP-C  Maximina Pirozzi, Roxan BROCKS, NP  Patient Care Team: Yolinda Duerr, Roxan BROCKS, NP as PCP - General (Family Medicine)  Extended Emergency Contact Information Primary Emergency Contact: Oto-Mcmanus,Grace Address: [address]          [place], [place] [address] [place] Mobile Phone: [phone] Relation: Daughter Secondary Emergency Contact: Zulauf,Crystille Mobile Phone: [phone] Relation: Daughter Preferred language: English Interpreter needed? No  Code Status:  Full Code  Goals of care: Advanced Directive information    03/18/2024   12:48 PM  Advanced Directives  Does Patient Have a Medical Advance Directive? No  Would patient like information on creating a medical advance directive? No - Patient declined     Chief Complaint  Patient presents with   Weight Loss    Discussed the use of AI scribe software for clinical note transcription with the patient, who gave verbal consent to proceed.  History of Present Illness   Anne Lewis is a 58 year old female who presents with persistent abdominal pain and systemic symptoms. She is accompanied by her daughter.  She experienced a recent episode of severe abdominal pain that began around 2 PM and worsened with intake of food or liquids. The pain was located on the right side and was accompanied by bloating and difficulty breathing. She sought emergency care where she was found to be dehydrated and received IV fluids and pain medication. The patient reports that imaging studies, including an ultrasound and two CT scans, were performed and she was told that a polyp was found on her gallbladder and that she had gastritis. She recalls being told that her AST was 90, ALT was 54, and her lipase was 63.  She continues to experience persistent abdominal pain, particularly when eating or walking, describing it as a burning sensation that radiates from the right side to the back. She also  reports muscle pain and headaches that have not improved. She has a history of diverticulitis, gastritis, and ulcers, and has been under the care of a gastroenterologist for several years. She previously tested negative for H. pylori via stool test but positive via breath test. She is currently completing a treatment course for H. pylori, with one day remaining.  She reports systemic symptoms including muscle and joint pain, fatigue, and night sweats. She experiences a burning sensation when urinating, although a recent urine test showed no infection. She has swollen lymph nodes and experiences numbness in her legs and arms.  She has a history of smoking intermittently and works in a physically demanding job, which involves a lot of movement. Despite drinking water regularly, she experiences frequent dehydration.   Past Medical History:  Diagnosis Date   Allergy    anemia    Anemia    Anxiety    Aortic atherosclerosis    Arthritis    B12 deficiency    Back pain    BRCA negative 11/2013   Chest pain    Constipation    Depression    Fatty liver    Gallbladder problem    GERD (gastroesophageal reflux disease)    H pylori ulcer    Heartburn    Joint pain    Migraines    Palpitation    Pre-diabetes    Seizures (HCC)    3 YEARS AGO UPDATED 11/07/22   sleep apnea    Stomach ulcer    Ulcer of gastric fundus    Vitamin D  deficiency  Past Surgical History:  Procedure Laterality Date   CESAREAN SECTION  8004,8001   COLONOSCOPY     ENDOMETRIAL CRYOABLATION  07/10/2011   HER OPTION - office performed   LAPAROSCOPIC APPENDECTOMY N/A 09/03/2021   Procedure: APPENDECTOMY LAPAROSCOPIC;  Surgeon: Rubin Calamity, MD;  Location: [facility];  Service: General;  Laterality: N/A;   TONSILLECTOMY  07/22/1985    Allergies  Allergen Reactions   Aspirin Swelling and Other (See Comments)    Bleeding   Augmentin [Amoxicillin -Pot Clavulanate] Swelling   Oxycodone  Nausea And Vomiting     Outpatient Encounter Medications as of 04/20/2024  Medication Sig   famotidine  (PEPCID ) 20 MG tablet TAKE 1 TABLET BY MOUTH TWICE A DAY   hydrOXYzine  (ATARAX ) 10 MG tablet Take 1 tablet (10 mg total) by mouth 3 (three) times daily as needed for anxiety or itching.   [EXPIRED] hyoscyamine  (LEVSIN  SL) 0.125 MG SL tablet Place 1 tablet (0.125 mg total) under the tongue every 6 (six) hours as needed for up to 8 days.   metroNIDAZOLE  (FLAGYL ) 500 MG tablet Take 500 mg by mouth 3 (three) times daily.   [EXPIRED] ondansetron  (ZOFRAN -ODT) 4 MG disintegrating tablet Take 1 tablet (4 mg total) by mouth every 8 (eight) hours as needed for up to 3 days for nausea or vomiting.   pantoprazole  (PROTONIX ) 40 MG tablet Take 1 tablet (40 mg total) by mouth 2 (two) times daily for 14 days.   polyethylene glycol (MIRALAX) 17 g packet Take 17 g by mouth daily.   sucralfate (CARAFATE) 1 GM/10ML suspension Take 10 mLs (1 g total) by mouth 4 (four) times daily -  with meals and at bedtime.   valACYclovir  (VALTREX ) 500 MG tablet Take 1,000 mg by mouth 2 (two) times daily.   [DISCONTINUED] doxycycline  (ADOXA) 100 MG tablet Take 100 mg by mouth 2 (two) times daily. (Patient not taking: Reported on 04/20/2024)   No facility-administered encounter medications on file as of 04/20/2024.    Review of Systems  Constitutional:  Positive for fatigue. Negative for appetite change, chills, fever and unexpected weight change.  HENT:  Negative for congestion, dental problem, ear discharge, ear pain, facial swelling, hearing loss, nosebleeds, postnasal drip, rhinorrhea, sinus pressure, sinus pain, sneezing, sore throat, tinnitus and trouble swallowing.   Eyes:  Negative for pain, discharge, redness, itching and visual disturbance.  Respiratory:  Negative for cough, chest tightness, shortness of breath and wheezing.   Cardiovascular:  Negative for chest pain, palpitations and leg swelling.  Gastrointestinal:  Positive for abdominal  pain. Negative for abdominal distention, blood in stool, constipation, diarrhea, nausea and vomiting.  Genitourinary:  Negative for difficulty urinating, dysuria, flank pain, frequency and urgency.  Musculoskeletal:  Positive for arthralgias. Negative for back pain, gait problem, joint swelling, myalgias, neck pain and neck stiffness.  Skin:  Negative for color change, pallor, rash and wound.  Neurological:  Positive for weakness and numbness. Negative for dizziness, syncope, speech difficulty, light-headedness and headaches.  Hematological:  Does not bruise/bleed easily.  Psychiatric/Behavioral:  Negative for agitation, behavioral problems, confusion, hallucinations and sleep disturbance. The patient is not nervous/anxious.     Immunization History  Administered Date(s) Administered   Influenza, Seasonal, Injecte, Preservative Fre 04/29/2023   Influenza,inj,Quad PF,6+ Mos 05/16/2021   Influenza-Unspecified 06/30/2011   Moderna Sars-Covid-2 Vaccination 01/31/2021, 03/03/2021   PFIZER Comirnaty(Gray Top)Covid-19 Tri-Sucrose Vaccine 01/20/2020, 02/21/2020   Rabies, IM 01/03/2018, 01/06/2018, 01/10/2018, 01/17/2018   Tdap 01/03/2018   Zoster Recombinant(Shingrix) 05/16/2021   Pertinent  Health Maintenance  Due  Topic Date Due   Influenza Vaccine  06/21/2024 (Originally 02/20/2024)   Mammogram  03/26/2026   Colonoscopy  Discontinued      04/29/2023    1:38 PM 11/10/2023    3:13 PM 02/05/2024    3:53 PM 03/18/2024   12:46 PM 04/14/2024    2:57 PM  Fall Risk  Falls in the past year? 1 1  1  0  Was there an injury with Fall? 1 0 1 1 0  Fall Risk Category Calculator 2 2  2  0  Patient at Risk for Falls Due to  No Fall Risks Other (Comment) No Fall Risks;History of fall(s) No Fall Risks  Patient at Risk for Falls Due to - Comments   Pt lost consciousness and fell and cracked her head open    Fall risk Follow up  Falls evaluation completed Falls evaluation completed Falls evaluation completed  Falls evaluation completed   Functional Status Survey:    Vitals:   04/20/24 1336  BP: 122/70  Pulse: 60  Resp: 19  Temp: 97.6 F (36.4 C)  SpO2: 98%  Weight: 122 lb 12.8 oz (55.7 kg)  Height: 5' 7.5 (1.715 m)   Body mass index is 18.95 kg/m. Physical Exam Physical Exam   MEASUREMENTS: Weight- 122.8. GENERAL: Alert, cooperative, well developed, no acute distress. HEENT: Normocephalic, normal oropharynx, moist mucous membranes. NECK: Multiple tender lymph nodes in the neck, axilla, and groin, mobile and not red. CHEST: Clear to auscultation bilaterally, no wheezes, rhonchi, or crackles. CARDIOVASCULAR: Normal heart rate and rhythm, S1 and S2 normal without murmurs. ABDOMEN: Tenderness in the right abdomen extending to the back, soft, non-distended, without organomegaly, normal bowel sounds. EXTREMITIES: No cyanosis or edema. NEUROLOGICAL: Cranial nerves grossly intact, moves all extremities without gross motor or sensory deficit.   Labs reviewed: Recent Labs    07/25/23 0910 11/05/23 2035 03/18/24 1406 04/16/24 2231 04/21/24 1431  NA 137   < > 136 141 140  K 4.5   < > 4.3 3.6 4.6  CL 104   < > 100 103 102  CO2 24   < > 28 23 28   GLUCOSE 92   < > 85 137* 83  BUN 28*   < > 28* 27* 26*  CREATININE 0.88   < > 0.85 1.10* 0.98  CALCIUM 10.1   < > 10.4 10.2 10.2  MG 2.1  --   --   --   --    < > = values in this interval not displayed.   Recent Labs    11/10/23 1548 12/09/23 2226 03/18/24 1406 04/16/24 2231 04/21/24 1431  AST 16 31 15  90* 22  ALT 13 27 13  54* 58*  ALKPHOS 51 60  --  81  --   BILITOT 0.4 0.8 0.4 0.4 0.5  PROT 7.3 7.8 7.7 6.8 6.6  ALBUMIN 4.6 4.5  --  4.5  --    Recent Labs    02/11/24 2133 02/11/24 2135 03/18/24 1406 04/16/24 2231 04/21/24 1431  WBC 6.3  --  6.0 6.9 6.4  NEUTROABS 2.6  --  3,246  --  3,558  HGB 13.4   < > 14.0 12.7 12.6  HCT 40.7   < > 42.6 39.0 38.4  MCV 92.9  --  93.2 91.5 93.2  PLT 337  --  315 336 340   < > =  values in this interval not displayed.   Lab Results  Component Value Date   TSH 0.46 03/18/2024  Lab Results  Component Value Date   HGBA1C 5.3 03/18/2024   Lab Results  Component Value Date   CHOL 171 03/18/2024   HDL 55 03/18/2024   LDLCALC 95 03/18/2024   LDLDIRECT 156.0 06/18/2021   TRIG 109 03/18/2024   CHOLHDL 3.1 03/18/2024    Significant Diagnostic Results in last 30 days:  CT Chest Wo Contrast Result Date: 05/01/2024 CLINICAL DATA:  Lymphadenopathy, fatigue * Tracking Code: BO * EXAM: CT CHEST WITHOUT CONTRAST TECHNIQUE: Multidetector CT imaging of the chest was performed following the standard protocol without IV contrast. RADIATION DOSE REDUCTION: This exam was performed according to the departmental dose-optimization program which includes automated exposure control, adjustment of the mA and/or kV according to patient size and/or use of iterative reconstruction technique. COMPARISON:  None Available. FINDINGS: Cardiovascular: Aortic atherosclerosis. Normal heart size. No pericardial effusion. Mediastinum/Nodes: No enlarged mediastinal, hilar, or axillary lymph nodes. Thyroid  gland, trachea, and esophagus demonstrate no significant findings. Lungs/Pleura: Mild biapical parenchymal scarring. Multiple tiny calcified benign pulmonary nodules. No pleural effusion or pneumothorax. Upper Abdomen: No acute abnormality. Musculoskeletal: No chest wall abnormality. No acute osseous findings. IMPRESSION: 1. No noncontrast CT findings of the chest to explain fatigue. 2. No evidence of lymphadenopathy in the chest. 3. Multiple tiny calcified benign pulmonary nodules, consistent with sequelae of prior granulomatous infection and requiring no further follow-up or characterization. Aortic Atherosclerosis (ICD10-I70.0). Electronically Signed   By: Marolyn JONETTA Jaksch M.D.   On: 05/01/2024 21:24   CT ABDOMEN PELVIS W CONTRAST Result Date: 04/17/2024 CLINICAL DATA:  Epigastric pain. EXAM: CT ABDOMEN  AND PELVIS WITH CONTRAST TECHNIQUE: Multidetector CT imaging of the abdomen and pelvis was performed using the standard protocol following bolus administration of intravenous contrast. RADIATION DOSE REDUCTION: This exam was performed according to the departmental dose-optimization program which includes automated exposure control, adjustment of the mA and/or kV according to patient size and/or use of iterative reconstruction technique. CONTRAST:  80mL OMNIPAQUE  IOHEXOL  300 MG/ML  SOLN COMPARISON:  April 17, 2024 (1:00 a.m.) FINDINGS: Lower chest: No acute abnormality. Hepatobiliary: There is diffuse fatty infiltration of the liver parenchyma. No focal liver abnormality is seen. No gallstones, gallbladder wall thickening, or biliary dilatation. Pancreas: Unremarkable. No pancreatic ductal dilatation or surrounding inflammatory changes. Spleen: Normal in size without focal abnormality. Adrenals/Urinary Tract: Adrenal glands are unremarkable. Kidneys are normal, without renal calculi, focal lesion, or hydronephrosis. Contrast is seen throughout the lumen of a poorly distended urinary bladder. Stomach/Bowel: Oral contrast is seen throughout the stomach which is within normal limits. The appendix is surgically absent. The duodenum and jejunum are opacified, while the ileum is poorly distended and unopacified and subsequently limited in evaluation. Fluid and air are seen throughout the large bowel. No evidence of bowel wall thickening, distention, or inflammatory changes. Vascular/Lymphatic: Aortic atherosclerosis. No enlarged abdominal or pelvic lymph nodes. Reproductive: Uterus and bilateral adnexa are unremarkable. Other: No abdominal wall hernia or abnormality. No abdominopelvic ascites. Musculoskeletal: No acute or significant osseous findings. IMPRESSION: 1. Hepatic steatosis. 2. Aortic atherosclerosis. Electronically Signed   By: Suzen Dials M.D.   On: 04/17/2024 03:21   CT ABDOMEN PELVIS W  CONTRAST Result Date: 04/17/2024 EXAM: CT ABDOMEN AND PELVIS WITH CONTRAST 04/17/2024 01:23:41 AM TECHNIQUE: CT of the abdomen and pelvis was performed with the administration of intravenous contrast. 80 mL iohexol  (OMNIPAQUE ) 300 MG/ML solution was administered. Multiplanar reformatted images are provided for review. Automated exposure control, iterative reconstruction, and/or weight-based adjustment of the mA/kV was utilized to reduce the  radiation dose to as low as reasonably achievable. COMPARISON: CT made 04/11/2024. CLINICAL HISTORY: Epigastric pain. FINDINGS: LOWER CHEST: No acute abnormality. LIVER: Hepatic steatosis. GALLBLADDER AND BILE DUCTS: Gallbladder is unremarkable. No biliary ductal dilatation. SPLEEN: No acute abnormality. PANCREAS: No acute abnormality. ADRENAL GLANDS: No acute abnormality. KIDNEYS, URETERS AND BLADDER: No stones in the kidneys or ureters. No hydronephrosis. No perinephric or periureteral stranding. Urinary bladder is unremarkable. GI AND BOWEL: Stomach demonstrates no acute abnormality. There is no bowel obstruction. PERITONEUM AND RETROPERITONEUM: No ascites. No free air. VASCULATURE: Aortic atherosclerotic calcification. Aorta is normal in caliber. LYMPH NODES: No lymphadenopathy. REPRODUCTIVE ORGANS: No acute abnormality. BONES AND SOFT TISSUES: No acute osseous abnormality. No focal soft tissue abnormality. IMPRESSION: 1. No acute findings in the abdomen or pelvis related to epigastric pain. 2. Hepatic steatosis. 3. This CT scan was performed without the oral contrast that was originally ordered. A repeat CT with IV and oral contrast can be performed at no additional charge. Electronically signed by: Norman Gatlin MD 04/17/2024 01:52 AM EDT RP Workstation: [id number]   US  Abdomen Limited RUQ (LIVER/GB) Result Date: 04/16/2024 CLINICAL DATA:  Abdominal pain with nausea and vomiting. EXAM: ULTRASOUND ABDOMEN LIMITED RIGHT UPPER QUADRANT COMPARISON:  None Available.  FINDINGS: Gallbladder: A 6 mm non-mobile echogenic focus is seen along the nondependent wall of a moderately distended gallbladder. Flow is present within this area on color Doppler evaluation. There is no evidence of gallbladder wall thickening (2.2 mm). A mild amount of pericholecystic fluid is seen. While a positive sonographic Beverley sign is noted by sonographer, she also stated that the patient is extremely tender to touch. Common bile duct: Diameter: 4.8 mm Liver: No focal lesion identified. Within normal limits in parenchymal echogenicity. Portal vein is patent on color Doppler imaging with normal direction of blood flow towards the liver. Other: None. IMPRESSION: 1. Findings suspicious for the presence of a small, likely benign gallbladder polyp. Correlation with six-month follow-up right upper quadrant ultrasound is recommended to confirm stability. This recommendation follows ACR consensus guidelines: White Paper of the ACR Incidental Findings Committee II on Gallbladder and Biliary Findings. J Am Coll Radiol 2013:;10:953-956. 2. Mild amount of pericholecystic fluid without additional findings to suggest the presence of acute cholecystitis. Further evaluation with abdomen and pelvis CT is recommended if clinical symptoms persist. Electronically Signed   By: Suzen Dials M.D.   On: 04/16/2024 23:52    Assessment/Plan  Right upper quadrant abdominal pain, suspect gallbladder origin with gallbladder polyp and possible cholecystitis Persistent right upper quadrant abdominal pain, likely related to gallbladder issues. Previous imaging showed gallbladder polyp and thickening, suggestive of possible cholecystitis. CT scan did not confirm cholecystitis, but ultrasound findings were suspicious. Pain exacerbated by movement and eating. - Refer to gastroenterologist for further evaluation of gallbladder issues - Advise to go to the emergency room if pain worsens, especially if it could indicate  gallbladder infection  Gastritis due to Helicobacter pylori infection Gastritis confirmed with H. pylori infection. Completing treatment for H. pylori with one day remaining. - Complete current H. pylori treatment regimen  Hepatic steatosis (fatty liver) Hepatic steatosis identified on imaging. No dietary factors identified as contributing. Family history of liver cancer noted. - Monitor hepatic steatosis, no immediate intervention required  Aortic atherosclerosis Aortic atherosclerosis identified on imaging. Cholesterol levels are normal. She does not consume fatty foods. - Monitor aortic atherosclerosis, no immediate intervention required  Dehydration Recurrent dehydration despite adequate water intake. Dehydration noted during recent hospital visit. - Recheck  BUN and creatinine levels to assess hydration status - Advise to monitor caffeine intake as it may contribute to dehydration  Generalized pain and muscle weakness, under evaluation Chronic generalized pain and muscle weakness with no clear etiology. Previous Lyme disease ruled out. Symptoms include joint and muscle pain, fatigue, and weakness. Neurological symptoms include loss of sensation in limbs. - Follow up with neurologist for further evaluation of muscle weakness and pain - Order CT scan of the chest to evaluate lymphadenopathy and rule out other causes  Lymphadenopathy, multiple sites, under evaluation Multiple sites of lymphadenopathy noted. Previous infectious disease evaluation ruled out Lyme disease. Hematology referral pending CT scan results. - Order CT scan of the chest to evaluate lymphadenopathy - Refer to hematologist after CT scan results  Dysuria  Burning sensation during urination reported. Previous urine analysis showed no infection. - Obtain urine specimen for culture to rule out urinary tract infection  Chronic headache Persistent headaches reported, no specific etiology discussed.   Family/ staff  Communication: Reviewed plan of care with patient verbalized Understanding    Labs/tests ordered: - CT scan chest  - CBC/diff - CMP with GFR - Urine Culture   Next Appointment: Return for Fasting labs in the morning.   Total time: 30 minutes. Greater than 50% of total time spent doing patient education regarding Abdominal Pain,H.Pylori,Steatosis,Aortic Atherosclerosis,Generalized weakness,Lymphadenopathy,dysuria,chronic Headache,health maintenance including symptom/medication management.   Roxan JAYSON Plough, NP

## 2024-05-03 ENCOUNTER — Other Ambulatory Visit: Payer: Self-pay | Admitting: Family

## 2024-05-03 DIAGNOSIS — I700 Atherosclerosis of aorta: Secondary | ICD-10-CM

## 2024-05-03 DIAGNOSIS — R918 Other nonspecific abnormal finding of lung field: Secondary | ICD-10-CM

## 2024-05-03 MED ORDER — ATORVASTATIN CALCIUM 10 MG PO TABS: 10.0000 mg | ORAL_TABLET | Freq: Every day | ORAL | 3 refills | Status: DC

## 2024-05-07 ENCOUNTER — Encounter: Payer: Self-pay | Admitting: Acute Care

## 2024-05-07 ENCOUNTER — Ambulatory Visit: Admitting: Acute Care

## 2024-05-07 VITALS — BP 114/78 | HR 85 | Temp 99.2°F | Ht 68.0 in | Wt 113.8 lb

## 2024-05-07 DIAGNOSIS — R0602 Shortness of breath: Secondary | ICD-10-CM | POA: Diagnosis not present

## 2024-05-07 DIAGNOSIS — R634 Abnormal weight loss: Secondary | ICD-10-CM

## 2024-05-07 DIAGNOSIS — Z720 Tobacco use: Secondary | ICD-10-CM

## 2024-05-07 DIAGNOSIS — R0609 Other forms of dyspnea: Secondary | ICD-10-CM

## 2024-05-07 DIAGNOSIS — R079 Chest pain, unspecified: Secondary | ICD-10-CM | POA: Diagnosis not present

## 2024-05-07 DIAGNOSIS — R918 Other nonspecific abnormal finding of lung field: Secondary | ICD-10-CM

## 2024-05-07 DIAGNOSIS — R002 Palpitations: Secondary | ICD-10-CM | POA: Diagnosis not present

## 2024-05-07 DIAGNOSIS — R591 Generalized enlarged lymph nodes: Secondary | ICD-10-CM

## 2024-05-07 DIAGNOSIS — I2510 Atherosclerotic heart disease of native coronary artery without angina pectoris: Secondary | ICD-10-CM | POA: Diagnosis not present

## 2024-05-07 NOTE — Progress Notes (Signed)
 History of Present Illness Anne Lewis is a 58 y.o. female some day smoker referred for consult for shortness of breath . She will be followed by Dr. Shelah.   05/07/2024 Discussed the use of AI scribe software for clinical note transcription with the patient, who gave verbal consent to proceed.  History of Present Illness  Anne Lewis is a 58 year old female who presents pulmonary consult for lymphadenopathy on CT chest, as well as numerous pulmonary complaints.  She has been experiencing intermittent chest pain and shortness of breath for several months. The chest pain is pressure-like and associated with irregular heartbeats and palpitations. Shortness of breath occurs both at rest and with exertion, sometimes causing anxiety. She is unsure of the exact triggers for these symptoms.She has been seen by her PCP who has referred her to cardiology for evaluation of her cardiac issues.     She has a history of allergy and sinus trouble, chronic headaches, and difficulty sleeping, reporting only two hours of sleep per night. No sleep study has been conducted yet. She mentions experiencing rashes and swollen lymph nodes. A biopsy of a rash on her mid-back showed eczema. She has a history of significant mold exposure two years ago.  She tested positive for Lyme disease and was treated with antibiotics. Additionally, she tested positive for H. pylori and completed antibiotic treatment last week. She reports a history of stomach pain and a polyp on her gallbladder, which was swollen and is a new finding for her.  She has experienced significant weight loss, losing over 97 pounds in less than a year without trying. This weight loss began after an appendectomy, followed by frequent vomiting. She currently weighs approximately 113 pounds and has difficulty swallowing due to esophageal spasms. She has been referred and is followed by GI for these complaints.   Her family history includes  COPD, pulmonary fibrosis, heart disease, and a clotting disorder in her father, who passed away in 09/29/2023. Her mother has a history of rheumatism and colon cancer, currently in remission. She is allergic to Oxycontin , Augmentin, and cannot take aspirin due to bleeding concerns.  We have reviewed her latest Ct Chest done 04/28/2024. This showed no evidence of lymphadenopathy in the chest. She has multiple tiny calcified nodules consistent with previous granulomatous infection and requiring no further follow-up or characterization.  I discussed doing PFT's to see if we can determine any obstructive or restrictive issues. She is a some day smoker with an unknown smoking pack year history. I am hesitant to start her on albuterol without PFT's and with her history or heart palpitations. We will re-visit inhaler use after PFT's have been completed.   She is in no distress. She is able to speak in full sentences.  Could consider bronchoscopy with cultures as she is unable to produce sputum.     Test Results: CT Chest 04/28/2024 No noncontrast CT findings of the chest to explain fatigue. 2. No evidence of lymphadenopathy in the chest. 3. Multiple tiny calcified benign pulmonary nodules, consistent with sequelae of prior granulomatous infection and requiring no further follow-up or characterization.  Biopsy 03/16/2024 1. Skin, Mid Back   SPECIMEN COMMENTS:  SPECIMEN CLINICAL INFORMATION:  MICROSCOPIC DESCRIPTION  1. There is acanthosis with foci of slight spongiosis and parakeratosis.   An  infiltrate composed predominantly of lymphocytes is present around the  superficial vascular plexus. A PAS stain is negative for fungi.   The findings  are most consistent  with a chronic eczematous dermatitis such as contact,  nummular or atopic dermatitis.      Latest Ref Rng & Units 04/21/2024    2:31 PM 04/16/2024   10:31 PM 03/18/2024    2:06 PM  CBC  WBC 3.8 - 10.8 Thousand/uL 6.4  6.9  6.0    Hemoglobin 11.7 - 15.5 g/dL 87.3  87.2  85.9   Hematocrit 35.0 - 45.0 % 38.4  39.0  42.6   Platelets 140 - 400 Thousand/uL 340  336  315        Latest Ref Rng & Units 04/21/2024    2:31 PM 04/16/2024   10:31 PM 03/18/2024    2:06 PM  BMP  Glucose 65 - 99 mg/dL 83  862  85   BUN 7 - 25 mg/dL 26  27  28    Creatinine 0.50 - 1.03 mg/dL 9.01  8.89  9.14   BUN/Creat Ratio 6 - 22 (calc) 27   33   Sodium 135 - 146 mmol/L 140  141  136   Potassium 3.5 - 5.3 mmol/L 4.6  3.6  4.3   Chloride 98 - 110 mmol/L 102  103  100   CO2 20 - 32 mmol/L 28  23  28    Calcium 8.6 - 10.4 mg/dL 89.7  89.7  89.5     BNP No results found for: BNP  ProBNP No results found for: PROBNP  PFT No results found for: FEV1PRE, FEV1POST, FVCPRE, FVCPOST, TLC, DLCOUNC, PREFEV1FVCRT, PSTFEV1FVCRT  CT Chest Wo Contrast Result Date: 05/01/2024 CLINICAL DATA:  Lymphadenopathy, fatigue * Tracking Code: BO * EXAM: CT CHEST WITHOUT CONTRAST TECHNIQUE: Multidetector CT imaging of the chest was performed following the standard protocol without IV contrast. RADIATION DOSE REDUCTION: This exam was performed according to the departmental dose-optimization program which includes automated exposure control, adjustment of the mA and/or kV according to patient size and/or use of iterative reconstruction technique. COMPARISON:  None Available. FINDINGS: Cardiovascular: Aortic atherosclerosis. Normal heart size. No pericardial effusion. Mediastinum/Nodes: No enlarged mediastinal, hilar, or axillary lymph nodes. Thyroid  gland, trachea, and esophagus demonstrate no significant findings. Lungs/Pleura: Mild biapical parenchymal scarring. Multiple tiny calcified benign pulmonary nodules. No pleural effusion or pneumothorax. Upper Abdomen: No acute abnormality. Musculoskeletal: No chest wall abnormality. No acute osseous findings. IMPRESSION: 1. No noncontrast CT findings of the chest to explain fatigue. 2. No evidence of  lymphadenopathy in the chest. 3. Multiple tiny calcified benign pulmonary nodules, consistent with sequelae of prior granulomatous infection and requiring no further follow-up or characterization. Aortic Atherosclerosis (ICD10-I70.0). Electronically Signed   By: Marolyn JONETTA Jaksch M.D.   On: 05/01/2024 21:24   CT ABDOMEN PELVIS W CONTRAST Result Date: 04/17/2024 CLINICAL DATA:  Epigastric pain. EXAM: CT ABDOMEN AND PELVIS WITH CONTRAST TECHNIQUE: Multidetector CT imaging of the abdomen and pelvis was performed using the standard protocol following bolus administration of intravenous contrast. RADIATION DOSE REDUCTION: This exam was performed according to the departmental dose-optimization program which includes automated exposure control, adjustment of the mA and/or kV according to patient size and/or use of iterative reconstruction technique. CONTRAST:  80mL OMNIPAQUE  IOHEXOL  300 MG/ML  SOLN COMPARISON:  April 17, 2024 (1:00 a.m.) FINDINGS: Lower chest: No acute abnormality. Hepatobiliary: There is diffuse fatty infiltration of the liver parenchyma. No focal liver abnormality is seen. No gallstones, gallbladder wall thickening, or biliary dilatation. Pancreas: Unremarkable. No pancreatic ductal dilatation or surrounding inflammatory changes. Spleen: Normal in size without focal abnormality. Adrenals/Urinary Tract: Adrenal glands are unremarkable. Kidneys  are normal, without renal calculi, focal lesion, or hydronephrosis. Contrast is seen throughout the lumen of a poorly distended urinary bladder. Stomach/Bowel: Oral contrast is seen throughout the stomach which is within normal limits. The appendix is surgically absent. The duodenum and jejunum are opacified, while the ileum is poorly distended and unopacified and subsequently limited in evaluation. Fluid and air are seen throughout the large bowel. No evidence of bowel wall thickening, distention, or inflammatory changes. Vascular/Lymphatic: Aortic  atherosclerosis. No enlarged abdominal or pelvic lymph nodes. Reproductive: Uterus and bilateral adnexa are unremarkable. Other: No abdominal wall hernia or abnormality. No abdominopelvic ascites. Musculoskeletal: No acute or significant osseous findings. IMPRESSION: 1. Hepatic steatosis. 2. Aortic atherosclerosis. Electronically Signed   By: Suzen Dials M.D.   On: 04/17/2024 03:21   CT ABDOMEN PELVIS W CONTRAST Result Date: 04/17/2024 EXAM: CT ABDOMEN AND PELVIS WITH CONTRAST 04/17/2024 01:23:41 AM TECHNIQUE: CT of the abdomen and pelvis was performed with the administration of intravenous contrast. 80 mL iohexol  (OMNIPAQUE ) 300 MG/ML solution was administered. Multiplanar reformatted images are provided for review. Automated exposure control, iterative reconstruction, and/or weight-based adjustment of the mA/kV was utilized to reduce the radiation dose to as low as reasonably achievable. COMPARISON: CT made 04/11/2024. CLINICAL HISTORY: Epigastric pain. FINDINGS: LOWER CHEST: No acute abnormality. LIVER: Hepatic steatosis. GALLBLADDER AND BILE DUCTS: Gallbladder is unremarkable. No biliary ductal dilatation. SPLEEN: No acute abnormality. PANCREAS: No acute abnormality. ADRENAL GLANDS: No acute abnormality. KIDNEYS, URETERS AND BLADDER: No stones in the kidneys or ureters. No hydronephrosis. No perinephric or periureteral stranding. Urinary bladder is unremarkable. GI AND BOWEL: Stomach demonstrates no acute abnormality. There is no bowel obstruction. PERITONEUM AND RETROPERITONEUM: No ascites. No free air. VASCULATURE: Aortic atherosclerotic calcification. Aorta is normal in caliber. LYMPH NODES: No lymphadenopathy. REPRODUCTIVE ORGANS: No acute abnormality. BONES AND SOFT TISSUES: No acute osseous abnormality. No focal soft tissue abnormality. IMPRESSION: 1. No acute findings in the abdomen or pelvis related to epigastric pain. 2. Hepatic steatosis. 3. This CT scan was performed without the oral contrast  that was originally ordered. A repeat CT with IV and oral contrast can be performed at no additional charge. Electronically signed by: Norman Gatlin MD 04/17/2024 01:52 AM EDT RP Workstation: [id number]   US  Abdomen Limited RUQ (LIVER/GB) Result Date: 04/16/2024 CLINICAL DATA:  Abdominal pain with nausea and vomiting. EXAM: ULTRASOUND ABDOMEN LIMITED RIGHT UPPER QUADRANT COMPARISON:  None Available. FINDINGS: Gallbladder: A 6 mm non-mobile echogenic focus is seen along the nondependent wall of a moderately distended gallbladder. Flow is present within this area on color Doppler evaluation. There is no evidence of gallbladder wall thickening (2.2 mm). A mild amount of pericholecystic fluid is seen. While a positive sonographic Beverley sign is noted by sonographer, she also stated that the patient is extremely tender to touch. Common bile duct: Diameter: 4.8 mm Liver: No focal lesion identified. Within normal limits in parenchymal echogenicity. Portal vein is patent on color Doppler imaging with normal direction of blood flow towards the liver. Other: None. IMPRESSION: 1. Findings suspicious for the presence of a small, likely benign gallbladder polyp. Correlation with six-month follow-up right upper quadrant ultrasound is recommended to confirm stability. This recommendation follows ACR consensus guidelines: White Paper of the ACR Incidental Findings Committee II on Gallbladder and Biliary Findings. J Am Coll Radiol 2013:;10:953-956. 2. Mild amount of pericholecystic fluid without additional findings to suggest the presence of acute cholecystitis. Further evaluation with abdomen and pelvis CT is recommended if clinical symptoms persist. Electronically Signed  By: Suzen Dials M.D.   On: 04/16/2024 23:52     Past medical hx Past Medical History:  Diagnosis Date   Allergy    anemia    Anemia    Anxiety    Aortic atherosclerosis    Arthritis    B12 deficiency    Back pain    BRCA negative  11/2013   Chest pain    Constipation    Depression    Fatty liver    Gallbladder problem    GERD (gastroesophageal reflux disease)    H pylori ulcer    Heartburn    Joint pain    Migraines    Palpitation    Pre-diabetes    Seizures (HCC)    3 YEARS AGO UPDATED 11/07/22   sleep apnea    Stomach ulcer    Ulcer of gastric fundus    Vitamin D  deficiency      Social History   Tobacco Use   Smoking status: Some Days    Types: Cigarettes   Smokeless tobacco: Never   Tobacco comments:    1 or 2 a week 05/07/2024 KRD  Vaping Use   Vaping status: Never Used  Substance Use Topics   Alcohol use: Yes    Comment: occ   Drug use: No    Ms.Macphail reports that she has been smoking cigarettes. She has never used smokeless tobacco. She reports current alcohol use. She reports that she does not use drugs. Some day smoker Tobacco Cessation: Ready to quit: Not Answered Counseling given: Not Answered Tobacco comments: 1 or 2 a week 05/07/2024 KRD Encouraged to quit  Past surgical hx, Family hx, Social hx all reviewed.  Current Outpatient Medications on File Prior to Visit  Medication Sig   atorvastatin (LIPITOR) 10 MG tablet Take 1 tablet (10 mg total) by mouth daily.   famotidine  (PEPCID ) 20 MG tablet TAKE 1 TABLET BY MOUTH TWICE A DAY   polyethylene glycol (MIRALAX) 17 g packet Take 17 g by mouth daily.   sucralfate (CARAFATE) 1 GM/10ML suspension Take 10 mLs (1 g total) by mouth 4 (four) times daily -  with meals and at bedtime.   No current facility-administered medications on file prior to visit.     Allergies  Allergen Reactions   Aspirin Swelling and Other (See Comments)    Bleeding   Augmentin [Amoxicillin -Pot Clavulanate] Swelling   Oxycodone  Nausea And Vomiting    Review Of Systems:  Constitutional:   +  weight loss, No night sweats,  Fevers, chills, + fatigue, or  lassitude.  HEENT:   No headaches,  Difficulty swallowing,  Tooth/dental problems, or  Sore  throat,                No sneezing, itching, ear ache, nasal congestion, post nasal drip,   CV:  No chest pain,  Orthopnea, PND, swelling in lower extremities, anasarca, dizziness, palpitations, syncope.   GI  No heartburn, indigestion, abdominal pain, nausea, vomiting, diarrhea, change in bowel habits, loss of appetite, bloody stools.   Resp: + shortness of breath with exertion and  at rest.  No excess mucus, no productive cough,  + non-productive cough,  No coughing up of blood.  No change in color of mucus.  No wheezing.  No chest wall deformity  Skin: no rash or lesions. Pt. States she has rashes that coome and go. Biopsy showed chronic eczematous dermatitis such as contact,  nummular or atopic dermatitis.     GU:  no dysuria, change in color of urine, no urgency or frequency.  No flank pain, no hematuria   MS:  No joint pain or swelling.  No decreased range of motion.  No back pain.  Psych:  No change in mood or affect. No depression or anxiety.  No memory loss.   Vital Signs BP 114/78   Pulse 85   Temp 99.2 F (37.3 C) (Oral) Comment: jsur drank hot coffee  Ht 5' 8 (1.727 m)   Wt 113 lb 12.8 oz (51.6 kg)   LMP 03/14/2018   SpO2 99%   BMI 17.30 kg/m    Physical Exam:  General- No distress,  A&Ox3, anxious ENT: No sinus tenderness, TM clear, pale nasal mucosa, no oral exudate,no post nasal drip, no LAN Cardiac: S1, S2, regular rate and rhythm, no murmur Chest: No wheeze/ rales/ dullness; no accessory muscle use, no nasal flaring, no sternal retractions Abd.: Soft Non-tender, ND, BS +, Body mass index is 17.3 kg/m. Very thin abdominal wall Ext: No clubbing cyanosis, edema, no obvious deformities Neuro:  normal strength, MAE x 4, A&O x 3, anxious, but asking appropriate questions Skin: No rashes, warm and dry, no obvious skin lesions, multiple tattoos. Psych: normal mood and behavior, again, anxious  Assessment & Plan Shortness of breath and chest pain Intermittent  symptoms possibly related to palpitations.  CT scan negative for pulmonary fibrosis or significant abnormalities.  Cardiac causes considered due to non-obstructive coronary artery atherosclerosis.>> referred to cardiology per PCP No lymphadenopathy - Order pulmonary function testing.   Palpitations Intermittent palpitations with shortness of breath and chest pain. Further evaluation needed for potential cardiac arrhythmia. - Per PCP and cardiology referral  Non-obstructive coronary artery atherosclerosis Lipitor initiated despite normal cholesterol due to plaque. Aspirin not tolerated. - Per PCP and cardiology - Avoid aspirin.  Pulmonary nodules and lymphadenopathy CT shows benign calcified nodules likely from prior infection. Previous lymphadenopathy resolved. -No further imaging needed per radiology - Can consider annual imaging, or imaging for new pulmonary issues  Unintentional weight loss Over 97 pounds lost unintentionally in less than a year. History of vomiting and swallowing difficulty with spasms noted.  - Per GI and PCP management  Lyme disease and H. pylori infection Lyme disease treated.  H. pylori infection treated with antibiotics, follow-up planned. - Recheck H. pylori status in two weeks per PCP or GI.  I spent 30 minutes dedicated to the care of this patient on the date of this encounter to include pre-visit review of records, face-to-face time with the patient discussing conditions above, post visit ordering of testing, clinical documentation with the electronic health record, making appropriate referrals as documented, and communicating necessary information to the patient's healthcare team.      Lauraine JULIANNA Lites, NP 05/07/2024  9:47 AM

## 2024-05-07 NOTE — Patient Instructions (Addendum)
 It is good to see you today. Your Ct Chest looks good. The nodules are tiny, and the lymphadenopathy has resolved.  Per radiology you do not need any further follow up. We will do Pulmonary Function Testing. You will get a call to get these scheduled.  Follow up with any MD afterward to review results. Follow up with cardiology per consult. Call if you need anything sooner. Please work on quitting smoking completely. You can receive free nicotine replacement therapy (patches, gum, or mints) by calling 1-800-QUIT NOW. Please call so we can get you on the path to becoming a non-smoker. I know it is hard, but you can do this!  Hypnosis for smoking cessation  [employer] [phone]  Acupuncture for smoking cessation  [employer] [phone]   Please contact office for sooner follow up if symptoms do not improve or worsen or seek emergency care

## 2024-05-20 ENCOUNTER — Other Ambulatory Visit: Payer: Self-pay | Admitting: Medical Genetics

## 2024-05-20 ENCOUNTER — Telehealth: Payer: Self-pay

## 2024-05-20 DIAGNOSIS — Z006 Encounter for examination for normal comparison and control in clinical research program: Secondary | ICD-10-CM

## 2024-05-20 DIAGNOSIS — I700 Atherosclerosis of aorta: Secondary | ICD-10-CM

## 2024-05-20 MED ORDER — ROSUVASTATIN CALCIUM 5 MG PO TABS: 5.0000 mg | ORAL_TABLET | Freq: Every day | ORAL | 3 refills | Status: DC

## 2024-05-20 NOTE — Telephone Encounter (Signed)
 Noted

## 2024-05-20 NOTE — Telephone Encounter (Signed)
 Please advise.     Copied from CRM #[id number]. Topic: Clinical - Medication Question >> May 20, 2024  9:17 AM Graeme ORN wrote: Reason for CRM: Patient called to schedule lab. Also states that medication prescribed generic for Lipitor has been removed by FDA> Would like to know what provider suggests. Can respond via Mychart. Thank You    ----------------------------------------------------------------------- From previous Reason for Contact - Scheduling: Patient/patient representative is calling to schedule an appointment. Refer to attachments for appointment information.

## 2024-05-20 NOTE — Telephone Encounter (Signed)
 Medication has been sent to pharmacy. Spoke with patient regarding scheduling appointment for Dec. Informed patient fasting labs. Patient verbalized understanding

## 2024-05-20 NOTE — Addendum Note (Signed)
 Addended by: ELNOR FOY T on: 05/20/2024 04:34 PM   Modules accepted: Orders

## 2024-05-20 NOTE — Telephone Encounter (Signed)
-   Discontinue Atorvastatin 10 mg tablet then start on Crestor 5 mg tablet one by mouth daily. Will follow up on atorvastatin recall updates.Last  lab work done 02/2024. Recommend scheduling appointment in December,2025 for fasting labs and follow up.

## 2024-05-21 ENCOUNTER — Emergency Department (HOSPITAL_BASED_OUTPATIENT_CLINIC_OR_DEPARTMENT_OTHER)
Admission: EM | Admit: 2024-05-21 | Discharge: 2024-05-21 | Disposition: A | Discharge status: Home / Self Care | Attending: Emergency Medicine | Admitting: Emergency Medicine

## 2024-05-21 ENCOUNTER — Other Ambulatory Visit: Payer: Self-pay

## 2024-05-21 DIAGNOSIS — S61250A Open bite of right index finger without damage to nail, initial encounter: Secondary | ICD-10-CM | POA: Insufficient documentation

## 2024-05-21 DIAGNOSIS — W540XXA Bitten by dog, initial encounter: Secondary | ICD-10-CM | POA: Insufficient documentation

## 2024-05-21 MED ORDER — DOXYCYCLINE HYCLATE 100 MG PO TABS
100.0000 mg | ORAL_TABLET | Freq: Once | ORAL | Status: AC
Start: 2024-05-21 — End: 2024-05-21
  Administered 2024-05-21: 100 mg via ORAL
  Filled 2024-05-21: qty 1

## 2024-05-21 MED ORDER — DOXYCYCLINE HYCLATE 100 MG PO CAPS: 100.0000 mg | ORAL_CAPSULE | Freq: Two times a day (BID) | ORAL | 0 refills | Status: AC

## 2024-05-21 NOTE — ED Provider Notes (Signed)
 [place] EMERGENCY DEPARTMENT AT [facility] Provider Note   CSN: [id number] Arrival date & time: 05/21/24  1539     Patient presents with: Animal Bite   Anne Lewis is a 58 y.o. female who presents the emergency department with a chief complaint of animal bite.  Patient states that she was bitten on her right index finger by her own dog.  Patient states that the dog is fully vaccinated and states that her tetanus shot is up-to-date.  Patient denies rabies risk.  Patient denies blood thinning medications, states that blood loss was controlled with only pressure.  Past medical history significant for GERD, vitamin D  deficiency, chronic constipation, etc.    Animal Bite      Prior to Admission medications   Medication Sig Start Date End Date Taking? Authorizing Provider  doxycycline  (VIBRAMYCIN ) 100 MG capsule Take 1 capsule (100 mg total) by mouth 2 (two) times daily for 14 days. 05/21/24 06/04/24 Yes Mackinsey Pelland F, PA-C  famotidine  (PEPCID ) 20 MG tablet TAKE 1 TABLET BY MOUTH TWICE A DAY 04/02/24   Ngetich, Dinah C, NP  polyethylene glycol (MIRALAX) 17 g packet Take 17 g by mouth daily.    [provider]  rosuvastatin (CRESTOR) 5 MG tablet Take 1 tablet (5 mg total) by mouth daily. 05/20/24   Ngetich, Dinah C, NP  sucralfate (CARAFATE) 1 GM/10ML suspension Take 10 mLs (1 g total) by mouth 4 (four) times daily -  with meals and at bedtime. 04/17/24   Trine Raynell Moder, MD    Allergies: Aspirin, Augmentin [amoxicillin -pot clavulanate], and Oxycodone     Review of Systems  Skin:  Positive for wound (Wound to right index finger).    Updated Vital Signs BP 121/78   Pulse 78   Temp 98.3 F (36.8 C) (Temporal)   Resp 19   Ht 5' 8 (1.727 m)   Wt 51.7 kg   LMP 03/14/2018   SpO2 99%   BMI 17.33 kg/m   Physical Exam Vitals and nursing note reviewed.  Constitutional:      General: She is awake. She is not in acute distress.    Appearance:  Normal appearance. She is not ill-appearing, toxic-appearing or diaphoretic.  HENT:     Head: Normocephalic and atraumatic.  Eyes:     General: No scleral icterus. Pulmonary:     Effort: Pulmonary effort is normal. No respiratory distress.  Musculoskeletal:        General: Normal range of motion.     Right lower leg: No edema.     Left lower leg: No edema.     Comments: Grossly normal range of motion of all 4 extremities including right index finger, patient able to fully flex right index finger and fully extend right index finger, right upper extremity neurovascularly intact, tenderness to palpation surrounding distal end of right index finger surrounding bite area and nail, nail appears intact with no significant separation from nailbed  Skin:    General: Skin is warm.     Capillary Refill: Capillary refill takes less than 2 seconds.     Comments: Small abrasion versus laceration present to right index finger, nail of right index finger still fixed to nailbed  Neurological:     General: No focal deficit present.     Mental Status: She is alert and oriented to person, place, and time.  Psychiatric:        Mood and Affect: Mood normal.        Behavior:  Behavior normal. Behavior is cooperative.        (all labs ordered are listed, but only abnormal results are displayed) Labs Reviewed - No data to display  EKG: None  Radiology: No results found.   Procedures   Medications Ordered in the ED  doxycycline  (VIBRA -TABS) tablet 100 mg (100 mg Oral Given 05/21/24 1738)                                    Medical Decision Making Risk Prescription drug management.   Patient presents to the ED for concern of animal bite, this involves an extensive number of treatment options, and is a complaint that carries with it a high risk of complications and morbidity.  The differential diagnosis includes rabies, infection, fracture, foreign body, tendon/ligament injury, soft tissue  injury, excessive bleeding, etc.   Co morbidities that complicate the patient evaluation  GERD, vitamin D  deficiency, chronic constipation   Medicines ordered and prescription drug management:  I ordered medication including doxycycline  for infection prophylaxis due to dog bite Reevaluation of the patient after these medicines showed that the patient stayed the same I have reviewed the patients home medicines and have made adjustments as needed   Test Considered:  Xray of right hand: Considered to rule out possibility of fracture or foreign body however patient declined, doubt fracture clinically as patient has good range of motion and is tender to palpation only around laceration site   Critical Interventions:  None   Problem List / ED Course:  58 year old female, vital signs stable, dog bite to right index finger, patient was bitten by her own dog, no concern for rabies, patient already up-to-date on tetanus On physical exam patient overall well-appearing, normal range of motion of right index finger and right hand, small abrasion versus laceration present to distal end of right index finger and pad, nail appears to be intact and not separated significantly from nailbed Cleaned wound extensively Will provide prophylactic antibiotics due to risk of infection, patient has history of anaphylactic reaction with Augmentin, will prescribe doxycycline  14-day course Return precautions given Patient discharged Most likely diagnosis at this time is uncomplicated dog bite to right index finger   Reevaluation:  After the interventions noted above, I reevaluated the patient and found that they have :stayed the same   Social Determinants of Health:  none   Dispostion:  After consideration of the diagnostic results and the patients response to treatment, I feel that the patient would benefit from discharge and outpatient therapy as described, please complete full course of  antibiotics.       Final diagnoses:  Dog bite, initial encounter    ED Discharge Orders          Ordered    doxycycline  (VIBRAMYCIN ) 100 MG capsule  2 times daily        05/21/24 1742               Alazia Crocket F, PA-C 05/21/24 2021    Zackowski, Scott, MD 05/23/24 1032

## 2024-05-21 NOTE — Discharge Instructions (Signed)
 It was a pleasure taking care of you today.  Based on your history and physical exam I feel you are safe for discharge.  Today you presented to the emergency department with a chief complaint of dog bite.  Today there was no concern for rabies as you state that your dog is fully vaccinated.  You also stated that your tetanus shot has been updated recently. You also declined an xray today. Due to the risk of infection you have been given prophylactic antibiotics, please pick up these antibiotics and complete the entire course.  If you experience any of the following symptoms including but not limited to fever, chills, severe swelling of the finger, severe pain, drainage, color changes, or other concerning symptom please return the emergency department or seek further medical care. Please make your primary care aware of your visit today and findings, and also your new prescription.

## 2024-05-21 NOTE — ED Triage Notes (Signed)
 Pt POV after her dog bit R index finger, bleeding controlled at this time, vaccines UTD, tetanus UTD.

## 2024-05-27 ENCOUNTER — Ambulatory Visit

## 2024-05-27 ENCOUNTER — Other Ambulatory Visit

## 2024-05-27 DIAGNOSIS — R1013 Epigastric pain: Secondary | ICD-10-CM

## 2024-05-27 DIAGNOSIS — R0609 Other forms of dyspnea: Secondary | ICD-10-CM

## 2024-05-27 LAB — PULMONARY FUNCTION TEST
DL/VA % pred: 88 %
DL/VA: 3.63 ml/min/mmHg/L
DLCO cor % pred: 99 %
DLCO cor: 23 ml/min/mmHg
DLCO unc % pred: 96 %
DLCO unc: 22.42 ml/min/mmHg
FEF 25-75 Post: 3.41 L/s
FEF 25-75 Pre: 3.05 L/s
FEF2575-%Change-Post: 11 %
FEF2575-%Pred-Post: 127 %
FEF2575-%Pred-Pre: 114 %
FEV1-%Change-Post: 4 %
FEV1-%Pred-Post: 121 %
FEV1-%Pred-Pre: 116 %
FEV1-Post: 3.65 L
FEV1-Pre: 3.5 L
FEV1FVC-%Change-Post: 9 %
FEV1FVC-%Pred-Pre: 100 %
FEV6-%Change-Post: -4 %
FEV6-%Pred-Post: 113 %
FEV6-%Pred-Pre: 118 %
FEV6-Post: 4.22 L
FEV6-Pre: 4.42 L
FEV6FVC-%Pred-Post: 103 %
FEV6FVC-%Pred-Pre: 103 %
FVC-%Change-Post: -4 %
FVC-%Pred-Post: 109 %
FVC-%Pred-Pre: 114 %
FVC-Post: 4.22 L
FVC-Pre: 4.42 L
Post FEV1/FVC ratio: 86 %
Post FEV6/FVC ratio: 100 %
Pre FEV1/FVC ratio: 79 %
Pre FEV6/FVC Ratio: 100 %
RV % pred: 178 %
RV: 3.83 L
TLC % pred: 139 %
TLC: 7.88 L

## 2024-05-27 NOTE — Patient Instructions (Signed)
 Full pft performed today

## 2024-05-27 NOTE — Progress Notes (Signed)
 Full pft performed today

## 2024-05-28 ENCOUNTER — Ambulatory Visit: Payer: Self-pay | Admitting: Family

## 2024-05-28 ENCOUNTER — Ambulatory Visit

## 2024-05-28 VITALS — BP 117/75 | HR 86 | Temp 97.8°F | Ht 67.0 in | Wt 112.6 lb

## 2024-05-28 DIAGNOSIS — J45909 Unspecified asthma, uncomplicated: Secondary | ICD-10-CM

## 2024-05-28 LAB — H. PYLORI BREATH TEST: H. pylori Breath Test: NOT DETECTED

## 2024-05-28 NOTE — Progress Notes (Signed)
 Subjective:   PATIENT ID: Anne Lewis, Anne Lewis, Anne Lewis   HPI Discussed the use of AI scribe software for clinical note transcription with the patient, who gave verbal consent to proceed.  History of Present Illness Anne Lewis is a 58 year old female who presents for follow up with shortness of breath and weight loss.  For the past six months to a year, she has experienced shortness of breath and occasional coughing, occurring both at rest and with exertion. A CT scan and pulmonary function tests were conducted, which showed very small calcified nodules.  She has experienced significant weight loss since undergoing an appendectomy a year ago. She reports ongoing gastrointestinal issues and has been tested twice for H. pylori bacteria, with treatment initiated based on the results.  She was diagnosed with Lyme disease, which has been treated. She also reports a recurrent rash on her back that was severe enough to require a biopsy, though the cause was not determined. Additionally, she mentions generalized lymphadenopathy, describing her lymph nodes as 'crazy.'  Her father passed away in August 18, 2023, which has been a significant emotional event for her.     Past Medical History:  Diagnosis Date   Allergy    anemia    Anemia    Anxiety    Aortic atherosclerosis    Arthritis    B12 deficiency    Back pain    BRCA negative 11/2013   Chest pain    Constipation    Depression    Fatty liver    Gallbladder problem    GERD (gastroesophageal reflux disease)    H pylori ulcer    Heartburn    Joint pain    Migraines    Palpitation    Pre-diabetes    Seizures (HCC)    3 YEARS AGO UPDATED 11/07/22   sleep apnea    Stomach ulcer    Ulcer of gastric fundus    Vitamin D  deficiency      Family History  Problem Relation Age of Onset   Colon polyps Mother    Colon cancer Mother    Diabetes Mother    Hypertension Mother    Cancer  Mother 35       intestine and ovarian   Diverticulitis Mother    Depression Mother    Anxiety disorder Mother    Sleep apnea Mother    Stroke Father    Diabetes Father    Hypertension Father    Heart disease Father    Heart attack Father        x 2   Colon cancer Sister    Colon polyps Sister    Rectal cancer Sister    Cancer Sister 21       ovarian and intestine   Cancer Brother 24       GI cancer   Breast cancer Maternal Aunt 74   Cancer Paternal Aunt 71       stomach   Cancer Maternal Grandmother 24       ovarian and pancreatic   Cancer Cousin 39       ovarian   Cancer Cousin 47       breast   Crohn's disease Neg Hx    Esophageal cancer Neg Hx    Stomach cancer Neg Hx    Ulcerative colitis Neg Hx      Social History   Socioeconomic History   Marital status: Married    Spouse  name: [name]   Number of children: Not on file   Years of education: Not on file   Highest education level: Bachelor's degree (e.g., BA, AB, BS)  Occupational History   Occupation: Oceanographer  Tobacco Use   Smoking status: Some Days    Types: Cigarettes   Smokeless tobacco: Never   Tobacco comments:    1 or 2 a week 05/07/2024 KRD  Vaping Use   Vaping status: Never Used  Substance and Sexual Activity   Alcohol use: Yes    Comment: occ   Drug use: No   Sexual activity: Not Currently    Partners: Male    Birth control/protection: Post-menopausal, Abstinence  Other Topics Concern   Not on file  Social History Narrative   Not on file   Social Drivers of Health   Financial Resource Strain: High Risk (04/19/2024)   Overall Financial Resource Strain (CARDIA)    Difficulty of Paying Living Expenses: Very hard  Food Insecurity: Food Insecurity Present (04/19/2024)   Hunger Vital Sign    Worried About Running Out of Food in the Last Year: Often true    Ran Out of Food in the Last Year: Often true  Transportation Needs: No Transportation Needs (04/19/2024)   PRAPARE  - Administrator, Civil Service (Medical): No    Lack of Transportation (Non-Medical): No  Physical Activity: Sufficiently Active (04/19/2024)   Exercise Vital Sign    Days of Exercise per Week: 5 days    Minutes of Exercise per Session: 70 min  Stress: Patient Declined (04/19/2024)   [employer] of Occupational Health - Occupational Stress Questionnaire    Feeling of Stress: Patient declined  Social Connections: Unknown (04/19/2024)   Social Connection and Isolation Panel    Frequency of Communication with Friends and Family: More than three times a week    Frequency of Social Gatherings with Friends and Family: More than three times a week    Attends Religious Services: Patient declined    Database Administrator or Organizations: No    Attends Engineer, Structural: Not on file    Marital Status: Patient declined  Catering Manager Violence: Not on file     Allergies  Allergen Reactions   Aspirin Swelling and Other (See Comments)    Bleeding   Augmentin [Amoxicillin -Pot Clavulanate] Swelling   Oxycodone  Nausea And Vomiting     Outpatient Medications Prior to Visit  Medication Sig Dispense Refill   doxycycline  (VIBRAMYCIN ) 100 MG capsule Take 1 capsule (100 mg total) by mouth 2 (two) times daily for 14 days. 28 capsule 0   famotidine  (PEPCID ) 20 MG tablet TAKE 1 TABLET BY MOUTH TWICE A DAY 180 tablet 1   polyethylene glycol (MIRALAX) 17 g packet Take 17 g by mouth daily.     rosuvastatin (CRESTOR) 5 MG tablet Take 1 tablet (5 mg total) by mouth daily. 90 tablet 3   sucralfate (CARAFATE) 1 GM/10ML suspension Take 10 mLs (1 g total) by mouth 4 (four) times daily -  with meals and at bedtime. 420 mL 0   No facility-administered medications prior to visit.    ROS Reviewed all systems and reported negative except as above     Objective:  There were no vitals filed for this visit.  Physical Exam Physical Exam GENERAL: Appropriate to age, no acute  distress. HEAD EYES EARS NOSE THROAT: Moist mucous membranes, atraumatic, normocephalic. CHEST: Clear to auscultation bilaterally, no wheezing, no crackles, no  rales. CARDIAC: Regular rate and rhythm, normal S1, normal S2, no murmurs, no rubs, no gallops. ABDOMEN: Soft, nontender. NEUROLOGICAL: Motor and sensation grossly intact, alert and oriented times X 3. EXTREMITIES: Warm, well perfused, no edema.     CBC    Component Value Date/Time   WBC 6.4 04/21/2024 1431   RBC 4.12 04/21/2024 1431   HGB 12.6 04/21/2024 1431   HGB 12.3 04/10/2023 1124   HCT 38.4 04/21/2024 1431   HCT 38.6 04/10/2023 1124   PLT 340 04/21/2024 1431   PLT 407 04/10/2023 1124   MCV 93.2 04/21/2024 1431   MCV 95 04/10/2023 1124   MCH 30.6 04/21/2024 1431   MCHC 32.8 04/21/2024 1431   RDW 12.9 04/21/2024 1431   RDW 12.8 04/10/2023 1124   LYMPHSABS 2.9 02/11/2024 2133   LYMPHSABS 2.0 04/10/2023 1124   MONOABS 0.5 02/11/2024 2133   EOSABS 179 04/21/2024 1431   EOSABS 0.2 04/10/2023 1124   BASOSABS 58 04/21/2024 1431   BASOSABS 0.1 04/10/2023 1124     Chest imaging:  PFT:    Latest Ref Rng & Units 05/27/2024   12:33 PM  PFT Results  FVC-Pre L 4.42  P  FVC-Predicted Pre % 114  P  FVC-Post L 4.22  P  FVC-Predicted Post % 109  P  Pre FEV1/FVC % % 79  P  Post FEV1/FCV % % 86  P  FEV1-Pre L 3.50  P  FEV1-Predicted Pre % 116  P  FEV1-Post L 3.65  P  DLCO uncorrected ml/min/mmHg 22.42  P  DLCO UNC% % 96  P  DLCO corrected ml/min/mmHg 23.00  P  DLCO COR %Predicted % 99  P  DLVA Predicted % 88  P  TLC L 7.88  P  TLC % Predicted % 139  P  RV % Predicted % 178  P    P Preliminary result     Assessment & Plan:   Assessment & Plan  Reports shortness of breath and coughing. CT shows small calcified nodules, no scarring. PFTs normal, no pulmonary cause identified.  - No further action in lung clinic. - Follow up with cardiologist and gastroenterologist. - Return to lung clinic if new symptoms  develop.        Zola Herter, MD [facility] Office: [phone]

## 2024-05-31 ENCOUNTER — Other Ambulatory Visit: Payer: Self-pay | Admitting: Adult Health

## 2024-05-31 DIAGNOSIS — A048 Other specified bacterial intestinal infections: Secondary | ICD-10-CM

## 2024-06-09 ENCOUNTER — Ambulatory Visit: Admitting: Gastroenterology

## 2024-06-09 ENCOUNTER — Other Ambulatory Visit

## 2024-06-09 ENCOUNTER — Encounter: Payer: Self-pay | Admitting: Gastroenterology

## 2024-06-09 ENCOUNTER — Telehealth: Payer: Self-pay

## 2024-06-09 VITALS — BP 102/60 | HR 100 | Ht 67.0 in | Wt 113.0 lb

## 2024-06-09 DIAGNOSIS — R1031 Right lower quadrant pain: Secondary | ICD-10-CM

## 2024-06-09 DIAGNOSIS — K219 Gastro-esophageal reflux disease without esophagitis: Secondary | ICD-10-CM | POA: Diagnosis not present

## 2024-06-09 DIAGNOSIS — R935 Abnormal findings on diagnostic imaging of other abdominal regions, including retroperitoneum: Secondary | ICD-10-CM

## 2024-06-09 DIAGNOSIS — R7989 Other specified abnormal findings of blood chemistry: Secondary | ICD-10-CM | POA: Diagnosis not present

## 2024-06-09 DIAGNOSIS — G8929 Other chronic pain: Secondary | ICD-10-CM

## 2024-06-09 DIAGNOSIS — R1011 Right upper quadrant pain: Secondary | ICD-10-CM | POA: Diagnosis not present

## 2024-06-09 DIAGNOSIS — R197 Diarrhea, unspecified: Secondary | ICD-10-CM

## 2024-06-09 LAB — HEPATIC FUNCTION PANEL
ALT: 14 U/L (ref 0–35)
AST: 16 U/L (ref 0–37)
Albumin: 4.8 g/dL (ref 3.5–5.2)
Alkaline Phosphatase: 59 U/L (ref 39–117)
Bilirubin, Direct: 0.1 mg/dL (ref 0.0–0.3)
Total Bilirubin: 0.6 mg/dL (ref 0.2–1.2)
Total Protein: 7.6 g/dL (ref 6.0–8.3)

## 2024-06-09 MED ORDER — PANTOPRAZOLE SODIUM 40 MG PO TBEC: 40.0000 mg | DELAYED_RELEASE_TABLET | Freq: Every day | ORAL | 3 refills | Status: DC

## 2024-06-09 NOTE — Patient Instructions (Addendum)
 _______________________________________________________  If your blood pressure at your visit was 140/90 or greater, please contact your primary care physician to follow up on this.  _______________________________________________________  If you are age 58 or older, your body mass index should be between 23-30. Your Body mass index is 17.7 kg/m. If this is out of the aforementioned range listed, please consider follow up with your Primary Care Provider.  If you are age 19 or younger, your body mass index should be between 19-25. Your Body mass index is 17.7 kg/m. If this is out of the aformentioned range listed, please consider follow up with your Primary Care Provider.   ________________________________________________________  The [facility] providers would like to encourage you to use MYCHART to communicate with providers for non-urgent requests or questions.  Due to long hold times on the telephone, sending your provider a message by [facility] may be a faster and more efficient way to get a response.  Please allow 48 business hours for a response.  Please remember that this is for non-urgent requests.  _______________________________________________________  [facility] is using a team-based approach to care.  Your team is made up of your doctor and two to three APPS. Our APPS (Nurse Practitioners and Physician Assistants) work with your physician to ensure care continuity for you. They are fully qualified to address your health concerns and develop a treatment plan. They communicate directly with your gastroenterologist to care for you. Seeing the Advanced Practice Practitioners on your physician's team can help you by facilitating care more promptly, often allowing for earlier appointments, access to diagnostic testing, procedures, and other specialty referrals.   Your provider has requested that you go to the basement level for lab work before leaving today. Press B on the  elevator. The lab is located at the first door on the left as you exit the elevator.  We have sent the following medications to your pharmacy for you to pick up at your convenience: Protonix  40mg  daily  A referral has been sent to [facility]. Please contact them at [phone] if you havent heard from them in 2 weeks.  It was a pleasure to see you today!  Thank you for trusting me with your gastrointestinal care!

## 2024-06-09 NOTE — Progress Notes (Signed)
 Chief Complaint: Epigastric pain Primary GI MD: Dr. Leigh  HPI: 59 year old female history of chronic constipation and others as listed below presents for evaluation of epigastric pain  Last seen January 2025 by Dr. Leigh.  Please see his note for details.  She was having right lower pain with negative CT, up-to-date colonoscopy, thought to be neuropathic or musculoskeletal and physical exam was negative.  Dr. Leigh discussed switching from Lexapro  to Cymbalta .  For her rectal pain DRE and anoscopy were negative and thought to have rectal spasms she was given Levsin    Discussed the use of AI scribe software for clinical note transcription with the patient, who gave verbal consent to proceed.  History of Present Illness  She experiences persistent abdominal pain, particularly in the right upper quadrant, which worsens after eating, especially with fatty or greasy foods. The pain is described as 'unbearable' . It typically occurs about an hour after eating and is exacerbated by movement. Had recent RUQ ultrasound with pericholecystic fluid and small gallbladder polyp with concern for cholecystitis and follow-up CT scan was negative.  She did have mild transaminitis  She has significant digestive issues, including watery diarrhea and constipation, managed with daily Miralax. She reports a substantial weight loss of approximately 90 pounds over the past year, attributed to difficulties with digestion and eating. Multiple CT scans this year which were negative. Being worked up for lyme disease. Certain foods exacerbate her symptoms, and she has attempted dietary modifications to alleviate the pain.  Her past medical history includes a positive H. pylori breath test in September 2025, for which she received treatment. She also has a history of Lyme disease, shingles, and a recent dog bite, all of which required antibiotic treatment. Despite extensive antibiotic use, she has not been  tested for C. diff.  She experiences frequent heartburn and is currently taking Pepcid  20 mg. She has a history of appendectomy and reports that her symptoms worsened following the surgery. She also mentions swollen lymph nodes, particularly in the groin area, which have been attributed to Lyme disease. She has a history of calcification in the lungs, possibly related to Lyme disease.  She is currently taking probiotics, specifically Align, to manage her gastrointestinal symptoms, and has been doing so since a previous H. pylori infection years ago.    PREVIOUS GI WORKUP   06/04/2022 EGD with Dr. Eda with mild distal esophagitis, mild gastritis and otherwise normal.  Pathology confirmed reflux esophagitis and gastritis with no H. pylori.  She was continued on Omeprazole  40 twice daily.   laparoscopic appendectomy in 09/03/2021.     09/19/2021 HIDA scan showed patent cystic and common bile ducts and normal gallbladder ejection fraction.   08/12/2022 CTAP with contrast with no acute abnormality, scattered left-sided colonic diverticulosis without findings of acute diverticulitis and aortic atherosclerosis.   12/03/2022 colonoscopy for a family history of colon cancer with one 4 mm polyp in the ascending colon, diverticulosis in the sigmoid colon, internal hemorrhoids and otherwise normal.  Pathology showed adenomatous polyp.  Repeat recommended in 5 years given patient's family history.   Barium swallow 04/14/23: IMPRESSION: Normal esophagram. No evidence of reflux, other disc motility or esophagitis. No fixed stricture. No hiatal hernia. Mild prominence of the cricopharyngeal impression, probably subclinical. 13 mm barium tablet passed normally.     CT abdomen / pelvis 07/25/23: FINDINGS: Lower chest: The lung bases are clear. No pleural effusion. The heart is normal in size. No pericardial effusion.   Hepatobiliary: The liver  is normal in size. Non-cirrhotic configuration. No suspicious  mass. No intrahepatic or extrahepatic bile duct dilation. No calcified gallstones. Normal gallbladder wall thickness. No pericholecystic inflammatory changes.   Pancreas: Unremarkable. No pancreatic ductal dilatation or surrounding inflammatory changes.   Spleen: Within normal limits. No focal lesion.   Adrenals/Urinary Tract: Adrenal glands are unremarkable. No suspicious renal mass. No hydronephrosis. No renal or ureteric calculi. Unremarkable urinary bladder.   Stomach/Bowel: No disproportionate dilation of the small or large bowel loops. No evidence of abnormal bowel wall thickening or inflammatory changes. The appendix was not visualized; however there is no acute inflammatory process in the right lower quadrant. There are scattered diverticula mainly in the left hemi colon, without imaging signs of diverticulitis.   Vascular/Lymphatic: No ascites or pneumoperitoneum. No abdominal or pelvic lymphadenopathy, by size criteria. No aneurysmal dilation of the major abdominal arteries. There are mild peripheral atherosclerotic vascular calcifications of the aorta and its major branches.   Reproductive: The uterus is unremarkable. No large adnexal mass.   Other: The visualized soft tissues and abdominal wall are unremarkable.   Musculoskeletal: No suspicious osseous lesions. There are mild multilevel degenerative changes in the visualized spine.   IMPRESSION: 1. No acute inflammatory process identified within the abdomen or pelvis. 2. Multiple other nonacute observations, as described above.  Past Medical History:  Diagnosis Date   Allergy    anemia    Anemia    Anxiety    Aortic atherosclerosis    Arthritis    B12 deficiency    Back pain    BRCA negative 11/2013   Chest pain    Constipation    Depression    Fatty liver    Gallbladder problem    GERD (gastroesophageal reflux disease)    H pylori ulcer    Heartburn    Joint pain    Migraines    Palpitation     Pre-diabetes    Seizures (HCC)    3 YEARS AGO UPDATED 11/07/22   sleep apnea    Stomach ulcer    Ulcer of gastric fundus    Vitamin D  deficiency     Past Surgical History:  Procedure Laterality Date   CESAREAN SECTION  8004,8001   COLONOSCOPY     ENDOMETRIAL CRYOABLATION  07/10/2011   HER OPTION - office performed   LAPAROSCOPIC APPENDECTOMY N/A 09/03/2021   Procedure: APPENDECTOMY LAPAROSCOPIC;  Surgeon: Rubin Calamity, MD;  Location: [facility];  Service: General;  Laterality: N/A;   TONSILLECTOMY  07/22/1985    Current Outpatient Medications  Medication Sig Dispense Refill   famotidine  (PEPCID ) 20 MG tablet TAKE 1 TABLET BY MOUTH TWICE A DAY 180 tablet 1   pantoprazole  (PROTONIX ) 40 MG tablet Take 1 tablet (40 mg total) by mouth daily. 90 tablet 3   polyethylene glycol (MIRALAX) 17 g packet Take 17 g by mouth daily.     sucralfate (CARAFATE) 1 GM/10ML suspension Take 10 mLs (1 g total) by mouth 4 (four) times daily -  with meals and at bedtime. 420 mL 0   rosuvastatin (CRESTOR) 5 MG tablet Take 1 tablet (5 mg total) by mouth daily. (Patient not taking: Reported on 06/09/2024) 90 tablet 3   No current facility-administered medications for this visit.    Allergies as of 06/09/2024 - Review Complete 06/09/2024  Allergen Reaction Noted   Aspirin Swelling and Other (See Comments) 05/28/2011   Augmentin [amoxicillin -pot clavulanate] Swelling 01/03/2018   Oxycodone  Nausea And Vomiting 01/03/2018    Family History  Problem Relation Age of Onset   Colon polyps Mother    Colon cancer Mother    Diabetes Mother    Hypertension Mother    Cancer Mother 77       intestine and ovarian   Diverticulitis Mother    Depression Mother    Anxiety disorder Mother    Sleep apnea Mother    Stroke Father    Diabetes Father    Hypertension Father    Heart disease Father    Heart attack Father        x 2   Colon cancer Sister    Colon polyps Sister    Rectal cancer Sister    Cancer  Sister 47       ovarian and intestine   Cancer Brother 24       GI cancer   Breast cancer Maternal Aunt 41   Cancer Paternal Aunt 10       stomach   Cancer Maternal Grandmother 49       ovarian and pancreatic   Cancer Cousin 39       ovarian   Cancer Cousin 47       breast   Crohn's disease Neg Hx    Esophageal cancer Neg Hx    Stomach cancer Neg Hx    Ulcerative colitis Neg Hx     Social History   Socioeconomic History   Marital status: Married    Spouse name: Marine Scientist   Number of children: Not on file   Years of education: Not on file   Highest education level: Bachelor's degree (e.g., BA, AB, BS)  Occupational History   Occupation: Oceanographer  Tobacco Use   Smoking status: Some Days    Types: Cigarettes   Smokeless tobacco: Never   Tobacco comments:    1 or 2 a week 05/07/2024 KRD  Vaping Use   Vaping status: Never Used  Substance and Sexual Activity   Alcohol use: Yes    Comment: occ   Drug use: No   Sexual activity: Not Currently    Partners: Male    Birth control/protection: Post-menopausal, Abstinence  Other Topics Concern   Not on file  Social History Narrative   Not on file   Social Drivers of Health   Financial Resource Strain: High Risk (04/19/2024)   Overall Financial Resource Strain (CARDIA)    Difficulty of Paying Living Expenses: Very hard  Food Insecurity: Food Insecurity Present (04/19/2024)   Hunger Vital Sign    Worried About Running Out of Food in the Last Year: Often true    Ran Out of Food in the Last Year: Often true  Transportation Needs: No Transportation Needs (04/19/2024)   PRAPARE - Administrator, Civil Service (Medical): No    Lack of Transportation (Non-Medical): No  Physical Activity: Sufficiently Active (04/19/2024)   Exercise Vital Sign    Days of Exercise per Week: 5 days    Minutes of Exercise per Session: 70 min  Stress: Patient Declined (04/19/2024)   [employer] of Occupational Health  - Occupational Stress Questionnaire    Feeling of Stress: Patient declined  Social Connections: Unknown (04/19/2024)   Social Connection and Isolation Panel    Frequency of Communication with Friends and Family: More than three times a week    Frequency of Social Gatherings with Friends and Family: More than three times a week    Attends Religious Services: Patient declined    Active Member of [employer]  or Organizations: No    Attends Engineer, Structural: Not on file    Marital Status: Patient declined  Intimate Partner Violence: Not on file    Review of Systems:    Constitutional: No weight loss, fever, chills, weakness or fatigue HEENT: Eyes: No change in vision               Ears, Nose, Throat:  No change in hearing or congestion Skin: No rash or itching Cardiovascular: No chest pain, chest pressure or palpitations   Respiratory: No SOB or cough Gastrointestinal: See HPI and otherwise negative Genitourinary: No dysuria or change in urinary frequency Neurological: No headache, dizziness or syncope Musculoskeletal: No new muscle or joint pain Hematologic: No bleeding or bruising Psychiatric: No history of depression or anxiety    Physical Exam:  Vital signs: BP 102/60   Pulse 100   Ht 5' 7 (1.702 m)   Wt 113 lb (51.3 kg)   LMP 03/14/2018   BMI 17.70 kg/m   Constitutional: NAD, alert and cooperative Head:  Normocephalic and atraumatic. Eyes:   PEERL, EOMI. No icterus. Conjunctiva pink. Respiratory: Respirations even and unlabored. Lungs clear to auscultation bilaterally.   No wheezes, crackles, or rhonchi.  Cardiovascular:  Regular rate and rhythm. No peripheral edema, cyanosis or pallor.  Gastrointestinal:  Soft, nondistended, nontender. No rebound or guarding. Normal bowel sounds. No appreciable masses or hepatomegaly. Rectal:  Declines Msk:  Symmetrical without gross deformities. Without edema, no deformity or joint abnormality.  Neurologic:  Alert and  oriented  x4;  grossly normal neurologically.  Skin:   Dry and intact without significant lesions or rashes. Psychiatric: Oriented to person, place and time. Demonstrates good judgement and reason without abnormal affect or behaviors.  Physical Exam    RELEVANT LABS AND IMAGING: CBC    Component Value Date/Time   WBC 6.4 04/21/2024 1431   RBC 4.12 04/21/2024 1431   HGB 12.6 04/21/2024 1431   HGB 12.3 04/10/2023 1124   HCT 38.4 04/21/2024 1431   HCT 38.6 04/10/2023 1124   PLT 340 04/21/2024 1431   PLT 407 04/10/2023 1124   MCV 93.2 04/21/2024 1431   MCV 95 04/10/2023 1124   MCH 30.6 04/21/2024 1431   MCHC 32.8 04/21/2024 1431   RDW 12.9 04/21/2024 1431   RDW 12.8 04/10/2023 1124   LYMPHSABS 2.9 02/11/2024 2133   LYMPHSABS 2.0 04/10/2023 1124   MONOABS 0.5 02/11/2024 2133   EOSABS 179 04/21/2024 1431   EOSABS 0.2 04/10/2023 1124   BASOSABS 58 04/21/2024 1431   BASOSABS 0.1 04/10/2023 1124    CMP     Component Value Date/Time   NA 140 04/21/2024 1431   NA 140 04/10/2023 1124   K 4.6 04/21/2024 1431   CL 102 04/21/2024 1431   CO2 28 04/21/2024 1431   GLUCOSE 83 04/21/2024 1431   BUN 26 (H) 04/21/2024 1431   BUN 25 (H) 04/10/2023 1124   CREATININE 0.98 04/21/2024 1431   CALCIUM 10.2 04/21/2024 1431   PROT 6.6 04/21/2024 1431   PROT 7.0 04/10/2023 1124   ALBUMIN 4.5 04/16/2024 2231   ALBUMIN 4.7 04/10/2023 1124   AST 22 04/21/2024 1431   ALT 58 (H) 04/21/2024 1431   ALKPHOS 81 04/16/2024 2231   BILITOT 0.5 04/21/2024 1431   BILITOT 0.3 04/10/2023 1124   GFRNONAA 58 (L) 04/16/2024 2231     Assessment/Plan:   RUQ pain with eating Nausea/vomiting RUQ US  03/2024 with small benign gallbladder polyp and pericholecystic fluid  suggesting cholecystitis. CTAP with contrast September 2025 unrevealing (has had 7 CTs over the last year and a half).  HIDA scan in March 2023 with ejection fraction 55%. EGD 2023 with mild esophagitis and gastritis negative for h pylori. Suspect  patient may have symptomatic biliary dyskinesia or possible chronic cholecystitis with RUQ US  per above and continued RUQ pain with eating - Refer to [facility] - Repeat HIDA scan - Avoid offensive foods  Elevated LFTs 03/2024: AST 90/ALT 54  04/2024: AST 22/ALT 58 Otherwise normal labs. RUQ US  03/2024 with small benign gallbladder polyp and pericholecystic fluid suggesting cholecystitis. CTAP with contrast September 2025 showing hepatic steatosis and unremarkable gallbladder -- Recheck LFTs today, if still elevated will pursue serologic workup to rule out viral, autoimmune, genetic etiology  GERD Continued heartburn symptoms despite Pepcid  20 mg once daily.  EGD 2023 with mild esophagitis/gastritis - Educated patient on lifestyle modifications - Trial of pantoprazole  40 mg once daily (to be taken if C. difficile is negative)   Diarrhea Ongoing watery diarrhea with several rounds of antibiotics recently.  Appears she was also experiencing similar when she saw Dr. Leigh earlier this year - Rule out C. difficile with stool study - Trial of Florastor x 2 weeks for history of multiple antibiotic use  Chronic RLQ pain Chronic RLQ pain since appendectomy with extensive imaging that has been negative.  Worse with movement.  Up-to-date on colonoscopy (2024) suspect musculoskeletal in nature. - Consider Cymbalta   History of H. pylori Positive H. pylori breath test September 2025 s/p treatment with quadruple therapy with confirmed eradication negative H. pylori breath test 05/2024  Weight loss Patient reports 90 pound weight loss over the last year with extensive negative workup including multiple CT scans.  Currently being worked up for Lyme disease and lymphadenopathy.  Up-to-date on colonoscopy.  Suspect persistent nausea/vomiting and RUQ pain that eating may be contributing.  -- continue to monitor -- with weight loss and diarrhea could consider pancreatic elastase to rule out pancreatic  insufficiency though suspect with current watery diarrhea and may be falsely low.  Will rule out infectious etiology first.  No risk factors to suggest pancreatic insufficiency at this time   Nestor Blower, [facility] 06/09/2024, 11:05 AM  Cc: Ngetich, Dinah C, NP

## 2024-06-09 NOTE — Telephone Encounter (Signed)
 Success fax to [facility] [phone] P [phone] all 19 pages regarding RUQ pain and abnormal US 

## 2024-06-10 ENCOUNTER — Ambulatory Visit: Payer: Self-pay | Admitting: Gastroenterology

## 2024-06-10 NOTE — Telephone Encounter (Signed)
 [facility] stated they have the referral they havent called the patient yet. Spoke with Merlynn said someone was off yesterday but it is in her stack to do

## 2024-06-10 NOTE — Progress Notes (Signed)
 Agree with assessment and plan as outlined. Given recent right upper quadrant ultrasound findings and her right upper quadrant pain and upper tract symptoms, agree with referral to [facility] to consider cholecystectomy. The last time I saw her she was having groin/right lower quadrant pain which I thought was more so musculoskeletal.  Her symptoms have changed since I have last seen her.

## 2024-06-22 ENCOUNTER — Encounter: Payer: Self-pay | Admitting: Family

## 2024-06-22 ENCOUNTER — Ambulatory Visit: Admitting: Family

## 2024-06-22 VITALS — BP 114/70 | HR 73 | Temp 97.7°F | Resp 18 | Ht 67.0 in | Wt 117.6 lb

## 2024-06-22 DIAGNOSIS — R21 Rash and other nonspecific skin eruption: Secondary | ICD-10-CM

## 2024-06-22 DIAGNOSIS — R202 Paresthesia of skin: Secondary | ICD-10-CM

## 2024-06-22 DIAGNOSIS — R591 Generalized enlarged lymph nodes: Secondary | ICD-10-CM | POA: Diagnosis not present

## 2024-06-22 DIAGNOSIS — I499 Cardiac arrhythmia, unspecified: Secondary | ICD-10-CM

## 2024-06-22 DIAGNOSIS — K5909 Other constipation: Secondary | ICD-10-CM | POA: Diagnosis not present

## 2024-06-22 NOTE — Progress Notes (Signed)
 Provider: Roxan Plough FNP-C   Shereta Crothers, Roxan BROCKS, NP  Patient Care Team: Cyriah Childrey, Roxan BROCKS, NP as PCP - General (Family Medicine)  Extended Emergency Contact Information Primary Emergency Contact: Oto-Marsiglia,Grace Address: [address]          [place], [place] [address] [place] Mobile Phone: [phone] Relation: Daughter Secondary Emergency Contact: Candee,Crystille Mobile Phone: [phone] Relation: Daughter Preferred language: English Interpreter needed? No  Code Status:  Full Code  Goals of care: Advanced Directive information    06/22/2024    3:13 PM  Advanced Directives  Does Patient Have a Medical Advance Directive? No  Would patient like information on creating a medical advance directive? No - Patient declined     Chief Complaint  Patient presents with   Follow-up   Discussed the use of AI scribe software for clinical note transcription with the patient, who gave verbal consent to proceed.  History of Present Illness   Anne Lewis is a 58 year old female who presents with persistent pain and skin lesions following meals.  She experiences severe pain after eating, regardless of the type of food consumed, affecting her right-side abdomen, Epigastric, back and hips. This pain is accompanied by skin lesions resembling 'cigarette burns', which appear spontaneously, are painful, sometimes bleed, and leave marks. She also experiences vomiting with certain foods and severe itchiness that leads to bleeding.  She has a history of gallbladder issues, with previous imaging showing fluid around the gallbladder. After consuming steak, she experiences severe pain, vomiting, and diarrhea with blood. She has been constipated and uses Miralax, but her stools remain watery and dark. She is scheduled to provide a stool sample for further testing.  She reports swollen lymph nodes in the armpits, inguinal region, and throat, which have increased in  number. Lymph nodes are not tender and no fever or chills.   She experiences irregular heartbeats described as 'skipping' or 'very strong', causing dizziness and occasional chest pain. She has not yet seen a cardiologist for this issue.  She reports numbness and weakness in her left hand and leg, affecting her balance and grip, which has been ongoing for a few months and is worsening. She would like a referral to neurologist.   She experiences severe spasms in her buttocks, which are painful enough to interrupt activities such as driving or working. These spasms occur unpredictably and are very painful.  Past Medical History:  Diagnosis Date   Allergy    anemia    Anemia    Anxiety    Aortic atherosclerosis    Arthritis    B12 deficiency    Back pain    BRCA negative 11/2013   Chest pain    Constipation    Depression    Fatty liver    Gallbladder problem    GERD (gastroesophageal reflux disease)    H pylori ulcer    Heartburn    Joint pain    Migraines    Palpitation    Pre-diabetes    Seizures (HCC)    3 YEARS AGO UPDATED 11/07/22   sleep apnea    Stomach ulcer    Ulcer of gastric fundus    Vitamin D  deficiency    Past Surgical History:  Procedure Laterality Date   CESAREAN SECTION  8004,8001   COLONOSCOPY     ENDOMETRIAL CRYOABLATION  07/10/2011   HER OPTION - office performed   LAPAROSCOPIC APPENDECTOMY N/A 09/03/2021   Procedure: APPENDECTOMY LAPAROSCOPIC;  Surgeon: Rubin Calamity, MD;  Location: [facility];  Service: General;  Laterality: N/A;   TONSILLECTOMY  07/22/1985    Allergies  Allergen Reactions   Aspirin Swelling and Other (See Comments)    Bleeding   Augmentin [Amoxicillin -Pot Clavulanate] Swelling   Oxycodone  Nausea And Vomiting    Allergies as of 06/22/2024       Reactions   Aspirin Swelling, Other (See Comments)   Bleeding   Augmentin [amoxicillin -pot Clavulanate] Swelling   Oxycodone  Nausea And Vomiting        Medication List         Accurate as of June 22, 2024  4:37 PM. If you have any questions, ask your nurse or doctor.          famotidine  20 MG tablet Commonly known as: PEPCID  TAKE 1 TABLET BY MOUTH TWICE A DAY   MiraLax 17 g packet Generic drug: polyethylene glycol Take 17 g by mouth daily.   pantoprazole  40 MG tablet Commonly known as: PROTONIX  Take 1 tablet (40 mg total) by mouth daily.   rosuvastatin  5 MG tablet Commonly known as: Crestor  Take 1 tablet (5 mg total) by mouth daily.   sucralfate  1 GM/10ML suspension Commonly known as: Carafate  Take 10 mLs (1 g total) by mouth 4 (four) times daily -  with meals and at bedtime. What changed:  when to take this reasons to take this        Review of Systems  Constitutional:  Positive for fatigue. Negative for appetite change, chills, fever and unexpected weight change.  HENT:  Negative for congestion, dental problem, ear discharge, ear pain, hearing loss, nosebleeds, postnasal drip, rhinorrhea, sinus pressure, sinus pain, sneezing, sore throat, tinnitus and trouble swallowing.   Eyes:  Negative for pain, discharge, redness, itching and visual disturbance.  Respiratory:  Negative for cough, chest tightness, shortness of breath and wheezing.   Cardiovascular:  Negative for chest pain, palpitations and leg swelling.       Reports irregular heart beat   Gastrointestinal:  Positive for abdominal pain and diarrhea. Negative for abdominal distention, blood in stool, constipation, nausea and vomiting.  Endocrine: Negative for cold intolerance, heat intolerance, polydipsia, polyphagia and polyuria.  Genitourinary:  Negative for difficulty urinating, dysuria, flank pain, frequency and urgency.  Musculoskeletal:  Negative for arthralgias, back pain, gait problem, joint swelling, myalgias, neck pain and neck stiffness.  Skin:  Negative for color change, pallor, rash and wound.  Neurological:  Negative for dizziness, syncope, speech difficulty, weakness,  light-headedness, numbness and headaches.  Hematological:  Does not bruise/bleed easily.       Swollen Lymph nodes along the neck,armpit and inguinal areas    Psychiatric/Behavioral:  Negative for agitation, behavioral problems, confusion, hallucinations, self-injury, sleep disturbance and suicidal ideas. The patient is not nervous/anxious.     Immunization History  Administered Date(s) Administered   Influenza, Seasonal, Injecte, Preservative Fre 04/29/2023   Influenza,inj,Quad PF,6+ Mos 05/16/2021   Influenza-Unspecified 06/30/2011   Moderna Sars-Covid-2 Vaccination 01/31/2021, 03/03/2021   PFIZER Comirnaty(Gray Top)Covid-19 Tri-Sucrose Vaccine 01/20/2020, 02/21/2020   Rabies, IM 01/03/2018, 01/06/2018, 01/10/2018, 01/17/2018   Tdap 01/03/2018   Zoster Recombinant(Shingrix) 05/16/2021   Pertinent  Health Maintenance Due  Topic Date Due   Influenza Vaccine  02/20/2024   Mammogram  03/26/2026   Colonoscopy  Discontinued      11/10/2023    3:13 PM 02/05/2024    3:53 PM 03/18/2024   12:46 PM 04/14/2024    2:57 PM 06/22/2024    3:12  PM  Fall Risk  Falls in the past year? 1  1 0 1  Was there an injury with Fall? 0  1  1  0  0  Fall Risk Category Calculator 2  2 0 1  Patient at Risk for Falls Due to No Fall Risks Other (Comment) No Fall Risks;History of fall(s) No Fall Risks No Fall Risks  Patient at Risk for Falls Due to - Comments  Pt lost consciousness and fell and cracked her head open     Fall risk Follow up Falls evaluation completed Falls evaluation completed Falls evaluation completed Falls evaluation completed Falls evaluation completed     Data saved with a previous flowsheet row definition   Functional Status Survey:    Vitals:   06/22/24 1515  BP: 114/70  Pulse: 73  Resp: 18  Temp: 97.7 F (36.5 C)  SpO2: 99%  Weight: 117 lb 9.6 oz (53.3 kg)  Height: 5' 7 (1.702 m)   Body mass index is 18.42 kg/m. Physical Exam VITALS: P- 73, BP- 114/70, SaO2-  99% MEASUREMENTS: Weight- 117. GENERAL: Alert, cooperative, well developed, no acute distress. HEENT: Normocephalic, normal oropharynx, moist mucous membranes, sinus tenderness present. NECK: Thyroid  normal, cervical lymphadenopathy present. CHEST: Clear to auscultation bilaterally, no wheezes, rhonchi, or crackles. CARDIOVASCULAR: Normal heart rate and rhythm, S1 and S2 normal without murmurs. ABDOMEN: Soft, tenderness in the upper abdomen and gallbladder region, non-distended, without organomegaly, normal bowel sounds. EXTREMITIES: No cyanosis or edema. NEUROLOGICAL: Cranial nerves grossly intact, moves all extremities without gross motor or sensory deficit.      Labs reviewed: Recent Labs    07/25/23 0910 11/05/23 2035 03/18/24 1406 04/16/24 2231 04/21/24 1431  NA 137   < > 136 141 140  K 4.5   < > 4.3 3.6 4.6  CL 104   < > 100 103 102  CO2 24   < > 28 23 28   GLUCOSE 92   < > 85 137* 83  BUN 28*   < > 28* 27* 26*  CREATININE 0.88   < > 0.85 1.10* 0.98  CALCIUM  10.1   < > 10.4 10.2 10.2  MG 2.1  --   --   --   --    < > = values in this interval not displayed.   Recent Labs    12/09/23 2226 03/18/24 1406 04/16/24 2231 04/21/24 1431 06/09/24 1043  AST 31   < > 90* 22 16  ALT 27   < > 54* 58* 14  ALKPHOS 60  --  81  --  59  BILITOT 0.8   < > 0.4 0.5 0.6  PROT 7.8   < > 6.8 6.6 7.6  ALBUMIN 4.5  --  4.5  --  4.8   < > = values in this interval not displayed.   Recent Labs    02/11/24 2133 02/11/24 2135 03/18/24 1406 04/16/24 2231 04/21/24 1431  WBC 6.3  --  6.0 6.9 6.4  NEUTROABS 2.6  --  3,246  --  3,558  HGB 13.4   < > 14.0 12.7 12.6  HCT 40.7   < > 42.6 39.0 38.4  MCV 92.9  --  93.2 91.5 93.2  PLT 337  --  315 336 340   < > = values in this interval not displayed.   Lab Results  Component Value Date   TSH 0.46 03/18/2024   Lab Results  Component Value Date   HGBA1C 5.3 03/18/2024  Lab Results  Component Value Date   CHOL 171 03/18/2024   HDL  55 03/18/2024   LDLCALC 95 03/18/2024   LDLDIRECT 156.0 06/18/2021   TRIG 109 03/18/2024   CHOLHDL 3.1 03/18/2024    Significant Diagnostic Results in last 30 days:  No results found.  Assessment/Plan Recurrent rash and pruritus possibly related to food ingestion Recurrent rash and pruritus occurring postprandially, presenting as cigarette burn-like lesions with itching and bleeding. No known food allergies. - Referred to dermatologist for evaluation of rash and pruritus - Referred to allergist for allergy testing to identify potential food allergens  Generalized lymphadenopathy Newly developed lymphadenopathy in axillary, inguinal, and cervical regions. No fever or systemic symptoms. Normal white blood cell count and platelet levels. Etiology unclear, requires further evaluation. - Referred to hematologist for evaluation of lymphadenopathy  Chronic gastrointestinal symptoms including constipation and diarrhea Chronic gastrointestinal symptoms with alternating constipation and diarrhea, including postprandial vomiting and diarrhea with blood. Recent H. pylori test negative. Scheduled for colonoscopy in March or April. Symptoms may contribute to muscle spasms and pain. - Continue follow-up with gastroenterologist for further evaluation and management - Collect stool sample for analysis to check for blood and other abnormalities  Cardiac arrhythmia with intermittent palpitations and dizziness Intermittent palpitations and dizziness with episodes of skipped or strong heartbeats. Previous EKG showed normal sinus rhythm. Symptoms cause dizziness and occasional chest pain. - Referred to cardiologist for evaluation of cardiac arrhythmia  Peripheral paresthesias Numbness and weakness in left hand and leg, affecting balance and grip strength. Symptoms have persisted for several months with recent exacerbation. Possible nerve involvement. - Referred to neurologist for evaluation of peripheral  paresthesias  General health maintenance Discussion of flu vaccination history and immune system concerns. Previous adverse reaction to flu and shingles vaccines. - Consider flu vaccination if no contraindications are present  Family/ staff Communication: Reviewed plan of care with patient verbalized understanding   Labs/tests ordered: None   Next Appointment : Return in about 6 months (around 12/21/2024) for medical mangement of chronic issues.SABRA   Spent 37 minutes of Face to face and non-face to face with patient  >50% time spent counseling; reviewing medical record; tests; labs; documentation and developing future plan of care.   Roxan JAYSON Plough, NP

## 2024-06-25 ENCOUNTER — Encounter: Payer: Self-pay | Admitting: Medical Oncology

## 2024-06-29 ENCOUNTER — Ambulatory Visit: Admitting: Allergy & Immunology

## 2024-06-30 ENCOUNTER — Encounter: Payer: Self-pay | Admitting: Medical Oncology

## 2024-06-30 ENCOUNTER — Inpatient Hospital Stay

## 2024-06-30 ENCOUNTER — Encounter: Payer: Self-pay | Admitting: Physician Assistant

## 2024-06-30 ENCOUNTER — Inpatient Hospital Stay: Attending: Physician Assistant | Admitting: Physician Assistant

## 2024-06-30 VITALS — BP 129/68 | HR 71 | Temp 98.4°F | Resp 18 | Ht 67.0 in | Wt 120.7 lb

## 2024-06-30 DIAGNOSIS — R591 Generalized enlarged lymph nodes: Secondary | ICD-10-CM

## 2024-06-30 DIAGNOSIS — Z803 Family history of malignant neoplasm of breast: Secondary | ICD-10-CM | POA: Diagnosis not present

## 2024-06-30 DIAGNOSIS — F17210 Nicotine dependence, cigarettes, uncomplicated: Secondary | ICD-10-CM | POA: Diagnosis not present

## 2024-06-30 DIAGNOSIS — Z800 Family history of malignant neoplasm of digestive organs: Secondary | ICD-10-CM | POA: Diagnosis not present

## 2024-06-30 DIAGNOSIS — Z8041 Family history of malignant neoplasm of ovary: Secondary | ICD-10-CM | POA: Diagnosis not present

## 2024-06-30 LAB — CBC WITH DIFFERENTIAL (CANCER CENTER ONLY)
Abs Immature Granulocytes: 0.01 K/uL (ref 0.00–0.07)
Basophils Absolute: 0.1 K/uL (ref 0.0–0.1)
Basophils Relative: 1 %
Eosinophils Absolute: 0.2 K/uL (ref 0.0–0.5)
Eosinophils Relative: 4 %
HCT: 38.8 % (ref 36.0–46.0)
Hemoglobin: 13 g/dL (ref 12.0–15.0)
Immature Granulocytes: 0 %
Lymphocytes Relative: 33 %
Lymphs Abs: 1.6 K/uL (ref 0.7–4.0)
MCH: 31.2 pg (ref 26.0–34.0)
MCHC: 33.5 g/dL (ref 30.0–36.0)
MCV: 93 fL (ref 80.0–100.0)
Monocytes Absolute: 0.5 K/uL (ref 0.1–1.0)
Monocytes Relative: 11 %
Neutro Abs: 2.4 K/uL (ref 1.7–7.7)
Neutrophils Relative %: 51 %
Platelet Count: 314 K/uL (ref 150–400)
RBC: 4.17 MIL/uL (ref 3.87–5.11)
RDW: 13.2 % (ref 11.5–15.5)
WBC Count: 4.8 K/uL (ref 4.0–10.5)
nRBC: 0 % (ref 0.0–0.2)

## 2024-06-30 LAB — SEDIMENTATION RATE: Sed Rate: 8 mm/h (ref 0–22)

## 2024-06-30 LAB — LACTATE DEHYDROGENASE: LDH: 163 U/L (ref 105–235)

## 2024-06-30 LAB — CMP (CANCER CENTER ONLY)
ALT: 38 U/L (ref 0–44)
AST: 35 U/L (ref 15–41)
Albumin: 4.8 g/dL (ref 3.5–5.0)
Alkaline Phosphatase: 81 U/L (ref 38–126)
Anion gap: 9 (ref 5–15)
BUN: 29 mg/dL — ABNORMAL HIGH (ref 6–20)
CO2: 31 mmol/L (ref 22–32)
Calcium: 10.1 mg/dL (ref 8.9–10.3)
Chloride: 100 mmol/L (ref 98–111)
Creatinine: 0.82 mg/dL (ref 0.44–1.00)
GFR, Estimated: >60 mL/min (ref >60)
Glucose, Bld: 85 mg/dL (ref 70–99)
Potassium: 4.2 mmol/L (ref 3.5–5.1)
Sodium: 140 mmol/L (ref 135–145)
Total Bilirubin: 0.3 mg/dL (ref 0.0–1.2)
Total Protein: 7.6 g/dL (ref 6.5–8.1)

## 2024-06-30 LAB — C-REACTIVE PROTEIN: CRP: 0.5 mg/dL (ref <1.0)

## 2024-06-30 NOTE — Progress Notes (Signed)
 [facility]  Patient presented to clinic, alone, for her scheduled appointment with PA-C Johnston. I introduced myself and provided them with my direct contact information. Patient was encouraged to call me with any questions/concerns she may have.  Colene KYM Raider, RN, BSN Oncology Nurse Navigator, [facility] 06/30/2024 3:49 PM

## 2024-06-30 NOTE — Progress Notes (Unsigned)
 SABRA

## 2024-07-01 ENCOUNTER — Ambulatory Visit: Admitting: Allergy & Immunology

## 2024-07-01 LAB — SURGICAL PATHOLOGY

## 2024-07-02 ENCOUNTER — Encounter (HOSPITAL_COMMUNITY)
Admission: RE | Admit: 2024-07-02 | Discharge: 2024-07-02 | Disposition: A | Source: Ambulatory Visit | Attending: Physician Assistant

## 2024-07-02 DIAGNOSIS — R591 Generalized enlarged lymph nodes: Secondary | ICD-10-CM

## 2024-07-02 LAB — GLUCOSE, CAPILLARY: Glucose-Capillary: 98 mg/dL (ref 70–99)

## 2024-07-02 LAB — FLOW CYTOMETRY

## 2024-07-02 MED ORDER — FLUDEOXYGLUCOSE F - 18 (FDG) INJECTION
6.5000 | Freq: Once | INTRAVENOUS | Status: AC
  Administered 2024-07-02: 5.84 via INTRAVENOUS

## 2024-07-05 NOTE — Progress Notes (Unsigned)
 Cardiology Office Note:    Date:  07/06/2024   ID:  Anne Lewis, DOB 18-May-1966, MRN [id number]  PCP:  Leonarda Roxan BROCKS, NP   [facility] Providers Cardiologist:  Jerel Balding, MD     Referring MD: Leonarda Roxan BROCKS, NP   Chief Complaint  Patient presents with   New Patient (Initial Visit)    Palpitations, chest pain    History of Present Illness:    Anne Lewis is a 58 y.o. female with a OSA, H. pylori infection, prediabetes, fatty liver, hyperlipidemia, new onset seizures, palpitations, chest pain.   She had a late diagnosis of Lyme disease 2 months ago after 1 year of symptoms.  She currently follows with oncology for lymphadenopathy.  She has had a 90+ pound weight loss over the past year that was unintentional along with night sweats.  She underwent PET scan 07/02/2024 with no definite evidence of malignancy, question reactive lymphadenopathy.  She presented to PCP 06/22/2024 and reported irregular heartbeat and palpitations.  She was referred to outpatient cardiology to establish care.  She recounts new onset seizures, syncope, and brain fog along with significant unintentional weight loss and intermittent rash. She has also developed chest pain and palpitations. After one year of symptoms, she was diagnosed with lyme disease, testing at her request. She completed ABX and also subsequently treated for H pylori infection.   She describes palpitations that are slow and heavy, hard pounding associated with dizziness and pre-syncope, hand tingling. Palpitations occur nearly daily, lasting greater than 5 minutes. Last near syncopal episode on Wed. She denies LOC.   Chest pain can occur outside of palpitations. She describes feeling like someone is standing on her chest, also feels swollen. She reports SOB that is worse now than when she was obese last year.  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 She remains active and climbs flights of stairs at work. Chest pain can occur when resting after exertion.  Father recently passed of heart issues in Feb (ICD in place with previous shocks, CHF, ischemic heart disease) Mother with HTN and heart problems - but she lives in [place], not sure of medical problems Maternal grandmother died during open heart surgery  She has 8 siblings, 7/9 are women - three have heart issues (CAD)  She former smoker, quit over 10 years ago - social smoker, occasional use. Very little alcohol, no THC or illicit drug use.  She lives at home with her son, 35 yo. She also has 2 daughters. One child has a heart murmur, the pregnant daughter just wore a heart monitor for palpitations during pregnancy.    Past Medical History:  Diagnosis Date   Allergy    anemia    Anemia    Anxiety    Aortic atherosclerosis    Arthritis    B12 deficiency    Back pain    BRCA negative 11/2013   Chest pain    Constipation    Depression    Fatty liver    Gallbladder problem    GERD (gastroesophageal reflux disease)    H pylori ulcer    Heartburn    Joint pain    Migraines    Palpitation    Pre-diabetes    Seizures (HCC)    3 YEARS AGO UPDATED 11/07/22   sleep apnea    Stomach ulcer    Ulcer of gastric fundus    Vitamin  D deficiency     Past Surgical History:  Procedure Laterality Date   CESAREAN SECTION  8004,8001   COLONOSCOPY     ENDOMETRIAL CRYOABLATION  07/10/2011   HER OPTION - office performed   LAPAROSCOPIC APPENDECTOMY N/A 09/03/2021   Procedure: APPENDECTOMY LAPAROSCOPIC;  Surgeon: Rubin Calamity, MD;  Location: [facility];  Service: General;  Laterality: N/A;   TONSILLECTOMY  07/22/1985    Current Medications: Active Medications[1]   Allergies:   Aspirin, Augmentin [amoxicillin -pot clavulanate], and Oxycodone    Social History   Socioeconomic History   Marital status: Married    Spouse name: Anne Lewis   Number of  children: Not on file   Years of education: Not on file   Highest education level: Bachelor's degree (e.g., BA, AB, BS)  Occupational History   Occupation: Oceanographer  Tobacco Use   Smoking status: Some Days    Types: Cigarettes   Smokeless tobacco: Never   Tobacco comments:    1 or 2 a week 05/07/2024 KRD  Vaping Use   Vaping status: Never Used  Substance and Sexual Activity   Alcohol use: Yes    Comment: occ   Drug use: No   Sexual activity: Not Currently    Partners: Male    Birth control/protection: Post-menopausal, Abstinence  Other Topics Concern   Not on file  Social History Narrative   Not on file   Social Drivers of Health   Tobacco Use: High Risk (06/30/2024)   Patient History    Smoking Tobacco Use: Some Days    Smokeless Tobacco Use: Never    Passive Exposure: Not on file  Financial Resource Strain: High Risk (06/21/2024)   Overall Financial Resource Strain (CARDIA)    Difficulty of Paying Living Expenses: Very hard  Food Insecurity: Food Insecurity Present (06/28/2024)   Epic    Worried About Programme Researcher, Broadcasting/film/video in the Last Year: Sometimes true    Ran Out of Food in the Last Year: Often true  Transportation Needs: No Transportation Needs (06/28/2024)   Epic    Lack of Transportation (Medical): No    Lack of Transportation (Non-Medical): No  Physical Activity: Sufficiently Active (06/21/2024)   Exercise Vital Sign    Days of Exercise per Week: 3 days    Minutes of Exercise per Session: 100 min  Stress: No Stress Concern Present (06/21/2024)   [employer] of Occupational Health - Occupational Stress Questionnaire    Feeling of Stress: Only a little  Social Connections: Socially Isolated (06/21/2024)   Social Connection and Isolation Panel    Frequency of Communication with Friends and Family: More than three times a week    Frequency of Social Gatherings with Friends and Family: Twice a week    Attends Religious Services: Patient declined     Database Administrator or Organizations: No    Attends Engineer, Structural: Not on file    Marital Status: Separated  Depression (PHQ2-9): Low Risk (06/22/2024)   Depression (PHQ2-9)    PHQ-2 Score: 0  Alcohol Screen: Low Risk (06/21/2024)   Alcohol Screen    Last Alcohol Screening Score (AUDIT): 1  Housing: Low Risk (06/28/2024)   Epic    Unable to Pay for Housing in the Last Year: No    Number of Times Moved in the Last Year: 0    Homeless in the Last Year: No  Utilities: Not At Risk (06/28/2024)   Epic    Threatened with loss of utilities: No  Health Literacy: Not on file     Family History: The patient's family history includes Anxiety disorder in her mother; Breast cancer (age of onset: 68) in her maternal aunt; Cancer (age of onset: 36) in her brother; Cancer (age of onset: 52) in her cousin; Cancer (age of onset: 29) in her sister; Cancer (age of onset: 64) in her cousin; Cancer (age of onset: 74) in her mother; Cancer (age of onset: 9) in her paternal aunt; Cancer (age of onset: 40) in her maternal grandmother; Colon cancer in her mother and sister; Colon polyps in her mother and sister; Depression in her mother; Diabetes in her father and mother; Diverticulitis in her mother; Heart attack in her father; Heart disease in her father; Hypertension in her father and mother; Rectal cancer in her sister; Sleep apnea in her mother; Stroke in her father. There is no history of Crohn's disease, Esophageal cancer, Stomach cancer, or Ulcerative colitis.  ROS:   Please see the history of present illness.     All other systems reviewed and are negative.  EKGs/Labs/Other Studies Reviewed:    The following studies were reviewed today:  EKG Interpretation Date/Time:  Tuesday July 06 2024 14:36:37 EST Ventricular Rate:  67 PR Interval:  118 QRS Duration:  80 QT Interval:  400 QTC Calculation: 422 R Axis:   52  Text Interpretation: Normal sinus rhythm Normal ECG When  compared with ECG of 16-Apr-2024 22:32, PREVIOUS ECG IS PRESENT Confirmed by Madie Slough ([id number]) on 07/06/2024 2:41:36 PM    Recent Labs: 07/25/2023: Magnesium 2.1 03/18/2024: TSH 0.46 06/30/2024: ALT 38; BUN 29; Creatinine 0.82; Hemoglobin 13.0; Platelet Count 314; Potassium 4.2; Sodium 140  Recent Lipid Panel    Component Value Date/Time   CHOL 171 03/18/2024 1406   CHOL 221 (H) 11/07/2021 1029   TRIG 109 03/18/2024 1406   HDL 55 03/18/2024 1406   HDL 44 11/07/2021 1029   CHOLHDL 3.1 03/18/2024 1406   VLDL 66.2 (H) 06/18/2021 1135   LDLCALC 95 03/18/2024 1406   LDLDIRECT 156.0 06/18/2021 1135     Risk Assessment/Calculations:                Physical Exam:    VS:  BP 110/70   Pulse 67   Ht 5' 8 (1.727 m)   Wt 116 lb (52.6 kg)   LMP 03/14/2018   SpO2 99%   BMI 17.64 kg/m     Wt Readings from Last 3 Encounters:  07/06/24 116 lb (52.6 kg)  06/30/24 120 lb 11.2 oz (54.7 kg)  06/22/24 117 lb 9.6 oz (53.3 kg)     GEN:  Well nourished, well developed in no acute distress HEENT: Normal NECK: No JVD; No carotid bruits LYMPHATICS: No lymphadenopathy CARDIAC: RRR, no murmurs, rubs, gallops RESPIRATORY:  Clear to auscultation without rales, wheezing or rhonchi  ABDOMEN: Soft, non-tender, non-distended MUSCULOSKELETAL:  No edema; No deformity  SKIN: Warm and dry NEUROLOGIC:  Alert and oriented x 3 PSYCHIATRIC:  Normal affect   ASSESSMENT:    1. Irregular heart rhythm   2. Precordial pain   3. Chest pain of uncertain etiology   4. Palpitations   5. Shortness of breath   6. Hyperlipidemia with target LDL less than 70   7. Aortic atherosclerosis   8. Lyme disease   9. Lymphadenopathy   10. Unintentional weight loss    PLAN:    In order of problems listed above:  Palpitations - questions arrhythmia, no recent LOC - will  start with 14 day monitor - TSH 02/2024 WNL   Chest discomfort, shortness of breath - question CM or myopericarditis  - I would like to  start with an echo, then will decide if we need to proceed with cMRI +/- CT coronary - given risk factors and family history, suspect we will need CT coronary after echo and heart monitor completed   Hyperlipidemia with LDL goal < 70 Aortic atherosclerosis - 10 mg crestor  initially started but then held for elevated LFTs, she reports never starting crestor , but now still on hold  - LFTs now normalized - will hold off for now    Lyme disease Lymphadenopathy New onset seizures Unintentional weight loss Recurring rash every time she eats - has seen dermatology, ID, neurology - due to see allergy and rheumatology   - case discussed with Dr. Francyne, may need to rule out alpha-gal syndrome   Luckily, we were able to schedule an echo tomorrow morning. Heart monitor also placed. She will follow up with Dr. Francyne in January.        Medication Adjustments/Labs and Tests Ordered: Current medicines are reviewed at length with the patient today.  Concerns regarding medicines are outlined above.  Orders Placed This Encounter  Procedures   LONG TERM MONITOR XT (3-14 DAYS)   EKG 12-Lead   ECHOCARDIOGRAM COMPLETE   No orders of the defined types were placed in this encounter.   Patient Instructions  Medication Instructions:  Your physician recommends that you continue on your current medications as directed. Please refer to the Current Medication list given to you today.  *If you need a refill on your cardiac medications before your next appointment, please call your pharmacy*  Lab Work: None ordered  If you have labs (blood work) drawn today and your tests are completely normal, you will receive your results only by: MyChart Message (if you have MyChart) OR A paper copy in the mail If you have any lab test that is abnormal or we need to change your treatment, we will call you to review the results.  Testing/Procedures: Your physician has requested that you have an  echocardiogram. Echocardiography is a painless test that uses sound waves to create images of your heart. It provides your doctor with information about the size and shape of your heart and how well your hearts chambers and valves are working. This procedure takes approximately one hour. There are no restrictions for this procedure. Please do NOT wear cologne, perfume, aftershave, or lotions (deodorant is allowed). Please arrive 15 minutes prior to your appointment time.  Please note: We ask at that you not bring children with you during ultrasound (echo/ vascular) testing. Due to room size and safety concerns, children are not allowed in the ultrasound rooms during exams. Our front office staff cannot provide observation of children in our lobby area while testing is being conducted. An adult accompanying a patient to their appointment will only be allowed in the ultrasound room at the discretion of the ultrasound technician under special circumstances. We apologize for any inconvenience.    ZIO XT- Long Term Monitor Instructions  Your physician has requested you wear a ZIO patch monitor for 14 days.  This is a single patch monitor. [employer] supplies one patch monitor per enrollment. Additional stickers are not available. Please do not apply patch if you will be having a Nuclear Stress Test,  Echocardiogram, Cardiac CT, MRI, or Chest Xray during the period you would be wearing the  monitor. The  patch cannot be worn during these tests. You cannot remove and re-apply the  ZIO XT patch monitor.  Your ZIO patch monitor will be mailed 3 day USPS to your address on file. It may take 3-5 days  to receive your monitor after you have been enrolled.  Once you have received your monitor, please review the enclosed instructions. Your monitor  has already been registered assigning a specific monitor serial # to you.  Billing and Patient Assistance Program Information  We have supplied [employer] with any of  your insurance information on file for billing purposes. [employer] offers a sliding scale Patient Assistance Program for patients that do not have  insurance, or whose insurance does not completely cover the cost of the ZIO monitor.  You must apply for the Patient Assistance Program to qualify for this discounted rate.  To apply, please call [employer] at [phone], select option 4, select option 2, ask to apply for  Patient Assistance Program. Meredeth will ask your household income, and how many people  are in your household. They will quote your out-of-pocket cost based on that information.  [employer] will also be able to set up a 6-month, interest-free payment plan if needed.  Applying the monitor   Shave hair from upper left chest.  Hold abrader disc by orange tab. Rub abrader in 40 strokes over the upper left chest as  indicated in your monitor instructions.  Clean area with 4 enclosed alcohol pads. Let dry.  Apply patch as indicated in monitor instructions. Patch will be placed under collarbone on left  side of chest with arrow pointing upward.  Rub patch adhesive wings for 2 minutes. Remove white label marked 1. Remove the white  label marked 2. Rub patch adhesive wings for 2 additional minutes.  While looking in a mirror, press and release button in center of patch. A small green light will  flash 3-4 times. This will be your only indicator that the monitor has been turned on.  Do not shower for the first 24 hours. You may shower after the first 24 hours.  Press the button if you feel a symptom. You will hear a small click. Record Date, Time and  Symptom in the Patient Logbook.  When you are ready to remove the patch, follow instructions on the last 2 pages of Patient  Logbook. Stick patch monitor onto the last page of Patient Logbook.  Place Patient Logbook in the blue and white box. Use locking tab on box and tape box closed  securely. The blue and white box has prepaid postage  on it. Please place it in the mailbox as  soon as possible. Your physician should have your test results approximately 7 days after the  monitor has been mailed back to [facility].  Call [facility] Customer Care at [phone] if you have questions regarding  your ZIO XT patch monitor. Call them immediately if you see an orange light blinking on your  monitor.  If your monitor falls off in less than 4 days, contact our Monitor department at [phone].  If your monitor becomes loose or falls off after 4 days call [employer] at [phone] for  suggestions on securing your monitor  Follow-Up: At [facility], you and your health needs are our priority.  As part of our continuing mission to provide you with exceptional heart care, our providers are all part of one team.  This team includes your primary Cardiologist (physician) and Advanced Practice Providers or APPs (Physician  Assistants and Nurse Practitioners) who all work together to provide you with the care you need, when you need it.  Your next appointment:   4 week(s)  Provider:   Jerel Balding, MD    We recommend signing up for the patient portal called MyChart.  Sign up information is provided on this After Visit Summary.  MyChart is used to connect with patients for Virtual Visits (Telemedicine).  Patients are able to view lab/test results, encounter notes, upcoming appointments, etc.  Non-urgent messages can be sent to your provider as well.   To learn more about what you can do with MyChart, go to [email or web address].   Other Instructions       Signed, Jon Nat Hails, [place]  07/06/2024 3:56 PM    [facility]     [1]  Current Meds  Medication Sig   polyethylene glycol (MIRALAX) 17 g packet Take 17 g by mouth daily.

## 2024-07-06 ENCOUNTER — Ambulatory Visit

## 2024-07-06 ENCOUNTER — Ambulatory Visit: Attending: Physician Assistant | Admitting: Physician Assistant

## 2024-07-06 ENCOUNTER — Encounter: Payer: Self-pay | Admitting: Physician Assistant

## 2024-07-06 VITALS — BP 110/70 | HR 67 | Ht 68.0 in | Wt 116.0 lb

## 2024-07-06 DIAGNOSIS — I499 Cardiac arrhythmia, unspecified: Secondary | ICD-10-CM

## 2024-07-06 DIAGNOSIS — I700 Atherosclerosis of aorta: Secondary | ICD-10-CM

## 2024-07-06 DIAGNOSIS — A6920 Lyme disease, unspecified: Secondary | ICD-10-CM

## 2024-07-06 DIAGNOSIS — E785 Hyperlipidemia, unspecified: Secondary | ICD-10-CM

## 2024-07-06 DIAGNOSIS — R002 Palpitations: Secondary | ICD-10-CM

## 2024-07-06 DIAGNOSIS — R0602 Shortness of breath: Secondary | ICD-10-CM

## 2024-07-06 DIAGNOSIS — R634 Abnormal weight loss: Secondary | ICD-10-CM

## 2024-07-06 DIAGNOSIS — R591 Generalized enlarged lymph nodes: Secondary | ICD-10-CM

## 2024-07-06 DIAGNOSIS — R079 Chest pain, unspecified: Secondary | ICD-10-CM

## 2024-07-06 DIAGNOSIS — R072 Precordial pain: Secondary | ICD-10-CM

## 2024-07-06 NOTE — Patient Instructions (Signed)
 Rapid Diagnostic Service Visit Discharge Information and Instructions  Thank you for choosing [facility] for your healthcare needs.  Below is a summary of today's discussion, along with our contact information and an outline of what to expect next.  Reason for Visit:  Lymphadenopathy  Proposed Diagnostic Care Plan: Labs PET/CT  What to Expect: - Generally, when lab tests are ordered the results can take up to 1 week for results to be available.  At that point, we will contact you to discuss your results with you.  Unless there is a critical result, we will typically wait for all of your lab results to be available before contacting you. - If a biopsy is part of your Care Plan, those results can take on average 7-10 days to result.  Once results are available, we will contact you to discuss your pathology results and any next steps. - If you have additional imaging ordered, such as a CT Scan, MRI, Ultrasound, Bone Scan, or PET scan, your imaging will need to be authorized then scheduled with the earliest available appointment.  You may be asked to travel to another hospital within [facility] who has a sooner availability, please consider doing so if asked. - If you use MyChart, your results will be available to you in the MyChart portal.  Your provider will be in touch with you as soon as all of your results are available to be discussed.  Your Diagnostic Clinic Provider:  Johnston Police PA-C and Dr. Norleen Kidney, contact number [phone] Your Diagnostic Navigator:  Colene Raider RN, contact number [phone]  If you or your caregiver have number blocking on your cell phones, please ensure the cancer center's numbers are not blocked.  If you are not a registered MyChart user, please consider enrolling in MyChart to receive your test results and visit notes.  You can also access your discharge instructions electronically.  MyChart also gives you an electronic means to communicate with  your Care Team instead of needing to call in to the cancer center.  We appreciate you trusting us  with your healthcare and look forward to partnering with you as we work to uncover what your potential diagnosis may be.  Please do not hesitate to reach out at any point with questions or concerns.

## 2024-07-06 NOTE — Progress Notes (Unsigned)
 Applied a 14 day Zio XT monitor to patient in the office ?

## 2024-07-06 NOTE — Patient Instructions (Signed)
 Medication Instructions:  Your physician recommends that you continue on your current medications as directed. Please refer to the Current Medication list given to you today.  *If you need a refill on your cardiac medications before your next appointment, please call your pharmacy*  Lab Work: None ordered  If you have labs (blood work) drawn today and your tests are completely normal, you will receive your results only by: MyChart Message (if you have MyChart) OR A paper copy in the mail If you have any lab test that is abnormal or we need to change your treatment, we will call you to review the results.  Testing/Procedures: Your physician has requested that you have an echocardiogram. Echocardiography is a painless test that uses sound waves to create images of your heart. It provides your doctor with information about the size and shape of your heart and how well your hearts chambers and valves are working. This procedure takes approximately one hour. There are no restrictions for this procedure. Please do NOT wear cologne, perfume, aftershave, or lotions (deodorant is allowed). Please arrive 15 minutes prior to your appointment time.  Please note: We ask at that you not bring children with you during ultrasound (echo/ vascular) testing. Due to room size and safety concerns, children are not allowed in the ultrasound rooms during exams. Our front office staff cannot provide observation of children in our lobby area while testing is being conducted. An adult accompanying a patient to their appointment will only be allowed in the ultrasound room at the discretion of the ultrasound technician under special circumstances. We apologize for any inconvenience.    ZIO XT- Long Term Monitor Instructions  Your physician has requested you wear a ZIO patch monitor for 14 days.  This is a single patch monitor. [employer] supplies one patch monitor per enrollment. Additional stickers are not available.  Please do not apply patch if you will be having a Nuclear Stress Test,  Echocardiogram, Cardiac CT, MRI, or Chest Xray during the period you would be wearing the  monitor. The patch cannot be worn during these tests. You cannot remove and re-apply the  ZIO XT patch monitor.  Your ZIO patch monitor will be mailed 3 day USPS to your address on file. It may take 3-5 days  to receive your monitor after you have been enrolled.  Once you have received your monitor, please review the enclosed instructions. Your monitor  has already been registered assigning a specific monitor serial # to you.  Billing and Patient Assistance Program Information  We have supplied [employer] with any of your insurance information on file for billing purposes. [employer] offers a sliding scale Patient Assistance Program for patients that do not have  insurance, or whose insurance does not completely cover the cost of the ZIO monitor.  You must apply for the Patient Assistance Program to qualify for this discounted rate.  To apply, please call [employer] at [phone], select option 4, select option 2, ask to apply for  Patient Assistance Program. Meredeth will ask your household income, and how many people  are in your household. They will quote your out-of-pocket cost based on that information.  [employer] will also be able to set up a 24-month, interest-free payment plan if needed.  Applying the monitor   Shave hair from upper left chest.  Hold abrader disc by orange tab. Rub abrader in 40 strokes over the upper left chest as  indicated in your monitor instructions.  Clean area with 4 enclosed  alcohol pads. Let dry.  Apply patch as indicated in monitor instructions. Patch will be placed under collarbone on left  side of chest with arrow pointing upward.  Rub patch adhesive wings for 2 minutes. Remove white label marked 1. Remove the white  label marked 2. Rub patch adhesive wings for 2 additional minutes.  While  looking in a mirror, press and release button in center of patch. A small green light will  flash 3-4 times. This will be your only indicator that the monitor has been turned on.  Do not shower for the first 24 hours. You may shower after the first 24 hours.  Press the button if you feel a symptom. You will hear a small click. Record Date, Time and  Symptom in the Patient Logbook.  When you are ready to remove the patch, follow instructions on the last 2 pages of Patient  Logbook. Stick patch monitor onto the last page of Patient Logbook.  Place Patient Logbook in the blue and white box. Use locking tab on box and tape box closed  securely. The blue and white box has prepaid postage on it. Please place it in the mailbox as  soon as possible. Your physician should have your test results approximately 7 days after the  monitor has been mailed back to [facility].  Call [facility] Customer Care at [phone] if you have questions regarding  your ZIO XT patch monitor. Call them immediately if you see an orange light blinking on your  monitor.  If your monitor falls off in less than 4 days, contact our Monitor department at [phone].  If your monitor becomes loose or falls off after 4 days call [employer] at [phone] for  suggestions on securing your monitor  Follow-Up: At [facility], you and your health needs are our priority.  As part of our continuing mission to provide you with exceptional heart care, our providers are all part of one team.  This team includes your primary Cardiologist (physician) and Advanced Practice Providers or APPs (Physician Assistants and Nurse Practitioners) who all work together to provide you with the care you need, when you need it.  Your next appointment:   4 week(s)  Provider:   Jerel Balding, MD    We recommend signing up for the patient portal called MyChart.  Sign up information is provided on this After Visit Summary.  MyChart  is used to connect with patients for Virtual Visits (Telemedicine).  Patients are able to view lab/test results, encounter notes, upcoming appointments, etc.  Non-urgent messages can be sent to your provider as well.   To learn more about what you can do with MyChart, go to [email or web address].   Other Instructions

## 2024-07-07 ENCOUNTER — Ambulatory Visit (HOSPITAL_COMMUNITY): Admission: RE | Admit: 2024-07-07 | Discharge: 2024-07-07 | Attending: Cardiology | Admitting: Cardiology

## 2024-07-07 ENCOUNTER — Telehealth: Payer: Self-pay | Admitting: Physician Assistant

## 2024-07-07 ENCOUNTER — Ambulatory Visit: Payer: Self-pay | Admitting: Physician Assistant

## 2024-07-07 ENCOUNTER — Other Ambulatory Visit: Payer: Self-pay

## 2024-07-07 DIAGNOSIS — R079 Chest pain, unspecified: Secondary | ICD-10-CM

## 2024-07-07 DIAGNOSIS — R591 Generalized enlarged lymph nodes: Secondary | ICD-10-CM

## 2024-07-07 DIAGNOSIS — R002 Palpitations: Secondary | ICD-10-CM | POA: Diagnosis present

## 2024-07-07 DIAGNOSIS — R072 Precordial pain: Secondary | ICD-10-CM

## 2024-07-07 DIAGNOSIS — R0602 Shortness of breath: Secondary | ICD-10-CM | POA: Diagnosis not present

## 2024-07-07 DIAGNOSIS — I514 Myocarditis, unspecified: Secondary | ICD-10-CM

## 2024-07-07 LAB — ECHOCARDIOGRAM COMPLETE
Area-P 1/2: 3.65 cm2
S' Lateral: 3.15 cm

## 2024-07-07 MED ORDER — METOPROLOL TARTRATE 25 MG PO TABS: ORAL_TABLET | ORAL | 0 refills | Status: AC

## 2024-07-07 NOTE — Telephone Encounter (Signed)
 I called Ms. Anne Lewis to review the lab results from 06/30/2024 and PET scan results from 07/02/2024. Lab results were unremarkable without cytopenias, no monoclonal B cell population detected in the flow. PET scan showed no definitive evidence of malignancy. There are subcentimeter bilateral posterior triangle lymph nodes that are unchanged from prior studies but shows hypermetabolism, most likely reactive in nature.   Recommended monitoring at this time and repeat CT neck in 3 months. Ms. Anne Lewis expressed understanding of the plan provided

## 2024-07-08 ENCOUNTER — Encounter: Payer: Self-pay | Admitting: Physician Assistant

## 2024-07-08 ENCOUNTER — Ambulatory Visit: Admitting: Family

## 2024-07-16 ENCOUNTER — Encounter (HOSPITAL_COMMUNITY): Payer: Self-pay

## 2024-07-19 ENCOUNTER — Ambulatory Visit (HOSPITAL_COMMUNITY)
Admission: RE | Admit: 2024-07-19 | Discharge: 2024-07-19 | Disposition: A | Discharge status: Home / Self Care | Source: Ambulatory Visit | Attending: Physician Assistant | Admitting: Physician Assistant

## 2024-07-19 DIAGNOSIS — R072 Precordial pain: Secondary | ICD-10-CM | POA: Insufficient documentation

## 2024-07-19 DIAGNOSIS — I514 Myocarditis, unspecified: Secondary | ICD-10-CM | POA: Diagnosis not present

## 2024-07-19 MED ORDER — SODIUM CHLORIDE 0.9 % IV SOLN: Freq: Once | INTRAVENOUS | Status: AC

## 2024-07-19 MED ORDER — NITROGLYCERIN 0.4 MG SL SUBL
0.8000 mg | SUBLINGUAL_TABLET | Freq: Once | SUBLINGUAL | Status: AC
  Administered 2024-07-19: 0.8 mg via SUBLINGUAL

## 2024-07-19 MED ORDER — IOHEXOL 350 MG/ML SOLN
100.0000 mL | Freq: Once | INTRAVENOUS | Status: AC | PRN
  Administered 2024-07-19: 100 mL via INTRAVENOUS

## 2024-07-20 ENCOUNTER — Ambulatory Visit: Payer: Self-pay | Admitting: Physician Assistant

## 2024-07-26 DIAGNOSIS — I499 Cardiac arrhythmia, unspecified: Secondary | ICD-10-CM

## 2024-07-26 DIAGNOSIS — R002 Palpitations: Secondary | ICD-10-CM | POA: Diagnosis not present

## 2024-07-27 ENCOUNTER — Ambulatory Visit: Admitting: Allergy & Immunology

## 2024-07-29 ENCOUNTER — Emergency Department (HOSPITAL_BASED_OUTPATIENT_CLINIC_OR_DEPARTMENT_OTHER)

## 2024-07-29 ENCOUNTER — Emergency Department (HOSPITAL_BASED_OUTPATIENT_CLINIC_OR_DEPARTMENT_OTHER)
Admission: EM | Admit: 2024-07-29 | Discharge: 2024-07-29 | Disposition: A | Discharge status: Home / Self Care | Attending: Emergency Medicine | Admitting: Emergency Medicine

## 2024-07-29 ENCOUNTER — Encounter (HOSPITAL_BASED_OUTPATIENT_CLINIC_OR_DEPARTMENT_OTHER): Payer: Self-pay

## 2024-07-29 ENCOUNTER — Other Ambulatory Visit: Payer: Self-pay

## 2024-07-29 DIAGNOSIS — S40022A Contusion of left upper arm, initial encounter: Secondary | ICD-10-CM | POA: Diagnosis not present

## 2024-07-29 DIAGNOSIS — S161XXA Strain of muscle, fascia and tendon at neck level, initial encounter: Secondary | ICD-10-CM | POA: Insufficient documentation

## 2024-07-29 DIAGNOSIS — R0789 Other chest pain: Secondary | ICD-10-CM

## 2024-07-29 DIAGNOSIS — M542 Cervicalgia: Secondary | ICD-10-CM | POA: Diagnosis present

## 2024-07-29 DIAGNOSIS — R1011 Right upper quadrant pain: Secondary | ICD-10-CM | POA: Insufficient documentation

## 2024-07-29 DIAGNOSIS — Y92410 Unspecified street and highway as the place of occurrence of the external cause: Secondary | ICD-10-CM | POA: Diagnosis not present

## 2024-07-29 DIAGNOSIS — R072 Precordial pain: Secondary | ICD-10-CM | POA: Diagnosis not present

## 2024-07-29 LAB — CBC WITH DIFFERENTIAL/PLATELET
Abs Immature Granulocytes: 0.04 K/uL (ref 0.00–0.07)
Basophils Absolute: 0.1 K/uL (ref 0.0–0.1)
Basophils Relative: 1 %
Eosinophils Absolute: 0 K/uL (ref 0.0–0.5)
Eosinophils Relative: 0 %
HCT: 41.4 % (ref 36.0–46.0)
Hemoglobin: 13.7 g/dL (ref 12.0–15.0)
Immature Granulocytes: 0 %
Lymphocytes Relative: 13 %
Lymphs Abs: 1.3 K/uL (ref 0.7–4.0)
MCH: 31.1 pg (ref 26.0–34.0)
MCHC: 33.1 g/dL (ref 30.0–36.0)
MCV: 93.9 fL (ref 80.0–100.0)
Monocytes Absolute: 0.6 K/uL (ref 0.1–1.0)
Monocytes Relative: 6 %
Neutro Abs: 8.1 K/uL — ABNORMAL HIGH (ref 1.7–7.7)
Neutrophils Relative %: 80 %
Platelets: 327 K/uL (ref 150–400)
RBC: 4.41 MIL/uL (ref 3.87–5.11)
RDW: 12.5 % (ref 11.5–15.5)
WBC: 10.2 K/uL (ref 4.0–10.5)
nRBC: 0 % (ref 0.0–0.2)

## 2024-07-29 LAB — COMPREHENSIVE METABOLIC PANEL WITH GFR
ALT: 35 U/L (ref 0–44)
AST: 24 U/L (ref 15–41)
Albumin: 5.1 g/dL — ABNORMAL HIGH (ref 3.5–5.0)
Alkaline Phosphatase: 74 U/L (ref 38–126)
Anion gap: 13 (ref 5–15)
BUN: 27 mg/dL — ABNORMAL HIGH (ref 6–20)
CO2: 26 mmol/L (ref 22–32)
Calcium: 10.3 mg/dL (ref 8.9–10.3)
Chloride: 103 mmol/L (ref 98–111)
Creatinine, Ser: 0.82 mg/dL (ref 0.44–1.00)
GFR, Estimated: >60 mL/min
Glucose, Bld: 97 mg/dL (ref 70–99)
Potassium: 4.1 mmol/L (ref 3.5–5.1)
Sodium: 142 mmol/L (ref 135–145)
Total Bilirubin: 0.3 mg/dL (ref 0.0–1.2)
Total Protein: 7.9 g/dL (ref 6.5–8.1)

## 2024-07-29 LAB — TROPONIN T, HIGH SENSITIVITY: Troponin T High Sensitivity: <15 ng/L (ref 0–19)

## 2024-07-29 LAB — LIPASE, BLOOD: Lipase: 51 U/L (ref 11–51)

## 2024-07-29 MED ORDER — ONDANSETRON HCL 4 MG/2ML IJ SOLN
4.0000 mg | Freq: Once | INTRAMUSCULAR | Status: AC
  Administered 2024-07-29: 4 mg via INTRAVENOUS
  Filled 2024-07-29: qty 2

## 2024-07-29 MED ORDER — KETOROLAC TROMETHAMINE 15 MG/ML IJ SOLN
15.0000 mg | Freq: Once | INTRAMUSCULAR | Status: AC
  Administered 2024-07-29: 15 mg via INTRAVENOUS
  Filled 2024-07-29: qty 1

## 2024-07-29 MED ORDER — ACETAMINOPHEN 500 MG PO TABS
1000.0000 mg | ORAL_TABLET | Freq: Once | ORAL | Status: AC
  Administered 2024-07-29: 1000 mg via ORAL
  Filled 2024-07-29: qty 2

## 2024-07-29 MED ORDER — LIDOCAINE 5 % EX PTCH: 1.0000 | MEDICATED_PATCH | CUTANEOUS | 0 refills | Status: AC

## 2024-07-29 MED ORDER — LIDOCAINE 5 % EX PTCH
1.0000 | MEDICATED_PATCH | Freq: Once | CUTANEOUS | Status: DC
  Administered 2024-07-29: 1 via TRANSDERMAL
  Filled 2024-07-29: qty 1

## 2024-07-29 MED ORDER — MORPHINE SULFATE (PF) 4 MG/ML IV SOLN
4.0000 mg | Freq: Once | INTRAVENOUS | Status: AC
  Administered 2024-07-29: 4 mg via INTRAVENOUS
  Filled 2024-07-29: qty 1

## 2024-07-29 MED ORDER — IOHEXOL 300 MG/ML  SOLN
75.0000 mL | Freq: Once | INTRAMUSCULAR | Status: AC | PRN
  Administered 2024-07-29: 75 mL via INTRAVENOUS
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
## 2024-08-02 ENCOUNTER — Encounter: Payer: Self-pay | Admitting: Nurse Practitioner

## 2024-08-02 ENCOUNTER — Ambulatory Visit: Admitting: Family

## 2024-08-02 VITALS — BP 134/78 | HR 83 | Temp 97.8°F | Resp 18 | Ht 68.0 in | Wt 118.6 lb

## 2024-08-02 DIAGNOSIS — M25512 Pain in left shoulder: Secondary | ICD-10-CM

## 2024-08-02 DIAGNOSIS — R300 Dysuria: Secondary | ICD-10-CM

## 2024-08-02 DIAGNOSIS — R918 Other nonspecific abnormal finding of lung field: Secondary | ICD-10-CM | POA: Diagnosis not present

## 2024-08-02 DIAGNOSIS — R1010 Upper abdominal pain, unspecified: Secondary | ICD-10-CM

## 2024-08-02 DIAGNOSIS — M542 Cervicalgia: Secondary | ICD-10-CM

## 2024-08-02 DIAGNOSIS — K219 Gastro-esophageal reflux disease without esophagitis: Secondary | ICD-10-CM

## 2024-08-02 LAB — POCT URINALYSIS DIPSTICK
Bilirubin, UA: NEGATIVE
Blood, UA: NEGATIVE
Glucose, UA: NEGATIVE
Ketones, UA: NEGATIVE
Leukocytes, UA: NEGATIVE
Nitrite, UA: NEGATIVE
Protein, UA: NEGATIVE
Spec Grav, UA: 1.015
Urobilinogen, UA: 0.2 U/dL
pH, UA: 5

## 2024-08-03 LAB — CBC WITH DIFFERENTIAL/PLATELET
Absolute Lymphocytes: 1318 {cells}/uL (ref 850–3900)
Absolute Monocytes: 462 {cells}/uL (ref 200–950)
Basophils Absolute: 21 {cells}/uL (ref 0–200)
Basophils Relative: 0.3 %
Eosinophils Absolute: 297 {cells}/uL (ref 15–500)
Eosinophils Relative: 4.3 %
HCT: 41 % (ref 35.9–46.0)
Hemoglobin: 13.3 g/dL (ref 11.7–15.5)
MCH: 30.4 pg (ref 27.0–33.0)
MCHC: 32.4 g/dL (ref 31.6–35.4)
MCV: 93.8 fL (ref 81.4–101.7)
MPV: 10.2 fL (ref 7.5–12.5)
Monocytes Relative: 6.7 %
Neutro Abs: 4802 {cells}/uL (ref 1500–7800)
Neutrophils Relative %: 69.6 %
Platelets: 333 Thousand/uL (ref 140–400)
RBC: 4.37 Million/uL (ref 3.80–5.10)
RDW: 12 % (ref 11.0–15.0)
Total Lymphocyte: 19.1 %
WBC: 6.9 Thousand/uL (ref 3.8–10.8)

## 2024-08-03 LAB — BASIC METABOLIC PANEL WITH GFR
BUN: 24 mg/dL (ref 7–25)
CO2: 29 mmol/L (ref 20–32)
Calcium: 9.9 mg/dL (ref 8.6–10.4)
Chloride: 103 mmol/L (ref 98–110)
Creat: 0.72 mg/dL (ref 0.50–1.03)
Glucose, Bld: 88 mg/dL (ref 65–99)
Potassium: 4.2 mmol/L (ref 3.5–5.3)
Sodium: 139 mmol/L (ref 135–146)
eGFR: 97 mL/min/1.73m2

## 2024-08-03 LAB — URINE CULTURE
MICRO NUMBER:: 17456190
Result:: NO GROWTH
SPECIMEN QUALITY:: ADEQUATE

## 2024-08-04 ENCOUNTER — Ambulatory Visit: Payer: Self-pay | Admitting: Family

## 2024-08-05 ENCOUNTER — Encounter: Payer: Self-pay | Admitting: Physical Medicine and Rehabilitation

## 2024-08-05 ENCOUNTER — Ambulatory Visit: Admitting: Physical Medicine and Rehabilitation

## 2024-08-05 VITALS — BP 114/70 | HR 70

## 2024-08-05 DIAGNOSIS — M542 Cervicalgia: Secondary | ICD-10-CM | POA: Diagnosis not present

## 2024-08-05 DIAGNOSIS — M5412 Radiculopathy, cervical region: Secondary | ICD-10-CM

## 2024-08-05 DIAGNOSIS — M7918 Myalgia, other site: Secondary | ICD-10-CM | POA: Diagnosis not present

## 2024-08-05 DIAGNOSIS — R202 Paresthesia of skin: Secondary | ICD-10-CM
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
## 2024-08-06 ENCOUNTER — Telehealth: Payer: Self-pay | Admitting: Physical Medicine and Rehabilitation

## 2024-08-06 NOTE — Telephone Encounter (Signed)
 Got authorization on the CSP MRI and sent to [facility] and [facility] with [employer]

## 2024-08-06 NOTE — Telephone Encounter (Signed)
 Pt called stating the facility that referral was sent for MRI dont accept her insurance and state [facility] do. Please send referral to [facility]. Call pt about this matter at [phone]

## 2024-08-09 ENCOUNTER — Ambulatory Visit: Admitting: Cardiovascular Disease
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
## 2024-08-17 ENCOUNTER — Ambulatory Visit: Admitting: Allergy & Immunology

## 2024-08-23 ENCOUNTER — Encounter: Payer: Self-pay | Admitting: *Deleted

## 2024-08-24 ENCOUNTER — Other Ambulatory Visit: Payer: Self-pay | Admitting: Physical Medicine and Rehabilitation

## 2024-08-24 ENCOUNTER — Telehealth: Payer: Self-pay | Admitting: Physical Medicine and Rehabilitation

## 2024-08-26 ENCOUNTER — Ambulatory Visit: Admitting: Allergy & Immunology

## 2024-09-01 ENCOUNTER — Ambulatory Visit (HOSPITAL_COMMUNITY)

## 2024-10-13 ENCOUNTER — Inpatient Hospital Stay

## 2024-10-13 ENCOUNTER — Inpatient Hospital Stay: Admitting: Hematology and Oncology

## 2024-10-27 ENCOUNTER — Ambulatory Visit: Admitting: Cardiovascular Disease

## 2024-12-21 ENCOUNTER — Ambulatory Visit: Admitting: Family
